# Patient Record
Sex: Female | Born: 1950
Health system: Southern US, Community
[De-identification: ages and names within clinical notes are randomized; demographics above are authoritative.]

## PROBLEM LIST (undated history)

## (undated) DIAGNOSIS — Z974 Presence of external hearing-aid: Secondary | ICD-10-CM

## (undated) DIAGNOSIS — E039 Hypothyroidism, unspecified: Secondary | ICD-10-CM

## (undated) DIAGNOSIS — K219 Gastro-esophageal reflux disease without esophagitis: Secondary | ICD-10-CM

## (undated) DIAGNOSIS — G43909 Migraine, unspecified, not intractable, without status migrainosus: Secondary | ICD-10-CM

## (undated) DIAGNOSIS — E119 Type 2 diabetes mellitus without complications: Secondary | ICD-10-CM

## (undated) DIAGNOSIS — D649 Anemia, unspecified: Secondary | ICD-10-CM

## (undated) DIAGNOSIS — R011 Cardiac murmur, unspecified: Secondary | ICD-10-CM

## (undated) DIAGNOSIS — I639 Cerebral infarction, unspecified: Secondary | ICD-10-CM

## (undated) DIAGNOSIS — M1990 Unspecified osteoarthritis, unspecified site: Secondary | ICD-10-CM

## (undated) DIAGNOSIS — E785 Hyperlipidemia, unspecified: Secondary | ICD-10-CM

## (undated) DIAGNOSIS — M545 Low back pain, unspecified: Secondary | ICD-10-CM

## (undated) DIAGNOSIS — T7840XA Allergy, unspecified, initial encounter: Secondary | ICD-10-CM

## (undated) DIAGNOSIS — F32A Depression, unspecified: Secondary | ICD-10-CM

## (undated) DIAGNOSIS — H209 Unspecified iridocyclitis: Secondary | ICD-10-CM

## (undated) DIAGNOSIS — Z973 Presence of spectacles and contact lenses: Secondary | ICD-10-CM

## (undated) DIAGNOSIS — N390 Urinary tract infection, site not specified: Secondary | ICD-10-CM

## (undated) DIAGNOSIS — J189 Pneumonia, unspecified organism: Secondary | ICD-10-CM

## (undated) DIAGNOSIS — R0683 Snoring: Secondary | ICD-10-CM

## (undated) DIAGNOSIS — G4733 Obstructive sleep apnea (adult) (pediatric): Secondary | ICD-10-CM

## (undated) DIAGNOSIS — I10 Essential (primary) hypertension: Secondary | ICD-10-CM

## (undated) DIAGNOSIS — K760 Fatty (change of) liver, not elsewhere classified: Secondary | ICD-10-CM

## (undated) DIAGNOSIS — G8929 Other chronic pain: Secondary | ICD-10-CM

## (undated) DIAGNOSIS — I5189 Other ill-defined heart diseases: Secondary | ICD-10-CM

## (undated) DIAGNOSIS — F329 Major depressive disorder, single episode, unspecified: Secondary | ICD-10-CM

## (undated) DIAGNOSIS — F419 Anxiety disorder, unspecified: Secondary | ICD-10-CM

## (undated) DIAGNOSIS — K5790 Diverticulosis of intestine, part unspecified, without perforation or abscess without bleeding: Secondary | ICD-10-CM

## (undated) DIAGNOSIS — Z9989 Dependence on other enabling machines and devices: Secondary | ICD-10-CM

## (undated) DIAGNOSIS — Z0000 Encounter for general adult medical examination without abnormal findings: Secondary | ICD-10-CM

## (undated) DIAGNOSIS — K7581 Nonalcoholic steatohepatitis (NASH): Secondary | ICD-10-CM

## (undated) HISTORY — DX: Hyperlipidemia, unspecified: E78.5

## (undated) HISTORY — DX: Unspecified iridocyclitis: H20.9

## (undated) HISTORY — DX: Migraine, unspecified, not intractable, without status migrainosus: G43.909

## (undated) HISTORY — DX: Fatty (change of) liver, not elsewhere classified: K76.0

## (undated) HISTORY — DX: Anemia, unspecified: D64.9

## (undated) HISTORY — DX: Unspecified osteoarthritis, unspecified site: M19.90

## (undated) HISTORY — DX: Major depressive disorder, single episode, unspecified: F32.9

## (undated) HISTORY — DX: Encounter for general adult medical examination without abnormal findings: Z00.00

## (undated) HISTORY — DX: Hypothyroidism, unspecified: E03.9

## (undated) HISTORY — PX: POLYPECTOMY: SHX149

## (undated) HISTORY — DX: Diverticulosis of intestine, part unspecified, without perforation or abscess without bleeding: K57.90

## (undated) HISTORY — DX: Gastro-esophageal reflux disease without esophagitis: K21.9

## (undated) HISTORY — DX: Depression, unspecified: F32.A

## (undated) HISTORY — DX: Essential (primary) hypertension: I10

## (undated) HISTORY — DX: Cardiac murmur, unspecified: R01.1

## (undated) HISTORY — DX: Allergy, unspecified, initial encounter: T78.40XA

## (undated) HISTORY — DX: Cerebral infarction, unspecified: I63.9

## (undated) HISTORY — PX: BLEPHAROPLASTY: SUR158

## (undated) HISTORY — PX: HEMORRHOID BANDING: SHX5850

## (undated) HISTORY — PX: TUBAL LIGATION: SHX77

---
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
## 1990-09-12 HISTORY — PX: VAGINAL HYSTERECTOMY: SUR661

## 1999-06-28 ENCOUNTER — Ambulatory Visit (HOSPITAL_COMMUNITY): Admission: RE | Admit: 1999-06-28 | Discharge: 1999-06-28 | Payer: Self-pay | Admitting: Gastroenterology

## 1999-09-13 HISTORY — PX: PELVIC FLOOR REPAIR: SHX2192

## 2007-08-13 LAB — HM COLONOSCOPY

## 2010-09-12 LAB — HM PAP SMEAR

## 2010-09-12 LAB — HM MAMMOGRAPHY

## 2011-09-13 HISTORY — PX: SHOULDER ARTHROSCOPY W/ ROTATOR CUFF REPAIR: SHX2400

## 2011-12-16 ENCOUNTER — Ambulatory Visit (INDEPENDENT_AMBULATORY_CARE_PROVIDER_SITE_OTHER): Payer: BC Managed Care – PPO | Admitting: Family Medicine

## 2011-12-16 ENCOUNTER — Encounter: Payer: Self-pay | Admitting: Family Medicine

## 2011-12-16 ENCOUNTER — Ambulatory Visit: Payer: Self-pay | Admitting: Family Medicine

## 2011-12-16 ENCOUNTER — Ambulatory Visit: Payer: Self-pay | Admitting: Internal Medicine

## 2011-12-16 VITALS — BP 106/58 | HR 90 | Temp 98.5°F | Ht 69.0 in | Wt 175.8 lb

## 2011-12-16 DIAGNOSIS — H209 Unspecified iridocyclitis: Secondary | ICD-10-CM

## 2011-12-16 DIAGNOSIS — M1990 Unspecified osteoarthritis, unspecified site: Secondary | ICD-10-CM

## 2011-12-16 DIAGNOSIS — F329 Major depressive disorder, single episode, unspecified: Secondary | ICD-10-CM

## 2011-12-16 DIAGNOSIS — M25519 Pain in unspecified shoulder: Secondary | ICD-10-CM

## 2011-12-16 DIAGNOSIS — G43909 Migraine, unspecified, not intractable, without status migrainosus: Secondary | ICD-10-CM

## 2011-12-16 DIAGNOSIS — E785 Hyperlipidemia, unspecified: Secondary | ICD-10-CM

## 2011-12-16 DIAGNOSIS — E039 Hypothyroidism, unspecified: Secondary | ICD-10-CM

## 2011-12-16 DIAGNOSIS — K5732 Diverticulitis of large intestine without perforation or abscess without bleeding: Secondary | ICD-10-CM

## 2011-12-16 DIAGNOSIS — K5792 Diverticulitis of intestine, part unspecified, without perforation or abscess without bleeding: Secondary | ICD-10-CM

## 2011-12-16 DIAGNOSIS — E119 Type 2 diabetes mellitus without complications: Secondary | ICD-10-CM

## 2011-12-16 MED ORDER — CYCLOBENZAPRINE HCL 10 MG PO TABS: 10.0000 mg | ORAL_TABLET | Freq: Every evening | ORAL | Status: AC | PRN

## 2011-12-16 NOTE — Patient Instructions (Addendum)
Check the american diabetes association website.   Exercise more.  Walking would be good.   Return for fasting labs.  We'll contact you with your lab report. Check on your insurance for the diabetes education class coverage.   Recheck in 6 months with labs ahead of time.

## 2011-12-19 ENCOUNTER — Encounter: Payer: Self-pay | Admitting: Family Medicine

## 2011-12-19 DIAGNOSIS — G43909 Migraine, unspecified, not intractable, without status migrainosus: Secondary | ICD-10-CM | POA: Insufficient documentation

## 2011-12-19 DIAGNOSIS — E785 Hyperlipidemia, unspecified: Secondary | ICD-10-CM | POA: Insufficient documentation

## 2011-12-19 DIAGNOSIS — M1990 Unspecified osteoarthritis, unspecified site: Secondary | ICD-10-CM | POA: Insufficient documentation

## 2011-12-19 DIAGNOSIS — K5792 Diverticulitis of intestine, part unspecified, without perforation or abscess without bleeding: Secondary | ICD-10-CM | POA: Insufficient documentation

## 2011-12-19 DIAGNOSIS — F329 Major depressive disorder, single episode, unspecified: Secondary | ICD-10-CM | POA: Insufficient documentation

## 2011-12-19 DIAGNOSIS — E1169 Type 2 diabetes mellitus with other specified complication: Secondary | ICD-10-CM | POA: Insufficient documentation

## 2011-12-19 DIAGNOSIS — E039 Hypothyroidism, unspecified: Secondary | ICD-10-CM | POA: Insufficient documentation

## 2011-12-19 DIAGNOSIS — H209 Unspecified iridocyclitis: Secondary | ICD-10-CM | POA: Insufficient documentation

## 2011-12-19 DIAGNOSIS — M25519 Pain in unspecified shoulder: Secondary | ICD-10-CM | POA: Insufficient documentation

## 2011-12-19 DIAGNOSIS — R739 Hyperglycemia, unspecified: Secondary | ICD-10-CM | POA: Insufficient documentation

## 2011-12-19 NOTE — Assessment & Plan Note (Signed)
Work on diet, weight, return for fasting labs.  Will review old records.

## 2011-12-19 NOTE — Progress Notes (Signed)
New patient.  Diabetes:  Using medications without difficulties:yes Hypoglycemic episodes:no Hyperglycemic episodes:no Feet problems:no Blood Sugars averaging: ~100 eye exam within last year: yes  L shoulder pain with dec in ROM.  Ongoing, no acute injury.  No R shoulder pain.  Pain reaching overhead with L arm.   Elevated Cholesterol: Using medications without problems:yes Muscle aches: except as above Diet compliance:yes Exercise:yes Other complaints: started on vytorin and due for repeat labs   PMH and SH reviewed  Meds, vitals, and allergies reviewed.   ROS: See HPI.  Otherwise negative.    GEN: nad, alert and oriented HEENT: mucous membranes moist NECK: supple w/o LA CV: rrr. Soft (1/6 SEM) murmur noted PULM: ctab, no inc wob ABD: soft, +bs EXT: no edema SKIN: no acute rash L shoulder with pain on cuff testing, ext rotation.  Dec in abduction, pain with supraspinatus testing. No pain on AC joint testing.

## 2011-12-19 NOTE — Assessment & Plan Note (Signed)
Work on diet, weight, return for fasting labs.  Will review old records.   

## 2011-12-19 NOTE — Assessment & Plan Note (Addendum)
Use flexeril for pain and refer to PT.  She declined ortho referral today.  Likely cuff pathology, okay for conservative tx initially.  >30 min spent with face to face with patient, >50% counseling and/or coordinating care

## 2011-12-22 ENCOUNTER — Telehealth: Payer: Self-pay | Admitting: Family Medicine

## 2011-12-22 NOTE — Telephone Encounter (Signed)
Done

## 2011-12-22 NOTE — Telephone Encounter (Signed)
Please scan selected items and then send medical records back to patient.  I didn't see specific date of td/pna vaccine in records.  These can be done later.  Thanks.

## 2011-12-23 ENCOUNTER — Telehealth: Payer: Self-pay | Admitting: *Deleted

## 2011-12-23 NOTE — Telephone Encounter (Signed)
I phoned the patient to let her know that her records have been reviewed, scanned and are ready for her to pick up.  She asked if she should have any fasting lab work right away or wait until her appt in October?  Please advise.

## 2011-12-25 NOTE — Telephone Encounter (Signed)
Discussed at last OV.  She was to come back for fasting labs after that OV and then get labs again before OV in the fall.  Future orders were put in the day of the OV.

## 2011-12-26 ENCOUNTER — Other Ambulatory Visit: Payer: Self-pay | Admitting: Family Medicine

## 2011-12-26 NOTE — Telephone Encounter (Signed)
Patient advised, appt scheduled.  

## 2011-12-28 ENCOUNTER — Other Ambulatory Visit: Payer: BC Managed Care – PPO

## 2011-12-29 ENCOUNTER — Other Ambulatory Visit (INDEPENDENT_AMBULATORY_CARE_PROVIDER_SITE_OTHER): Payer: BC Managed Care – PPO

## 2011-12-29 DIAGNOSIS — E119 Type 2 diabetes mellitus without complications: Secondary | ICD-10-CM

## 2011-12-29 LAB — LIPID PANEL
Cholesterol: 128 mg/dL (ref 0–200)
HDL: 39 mg/dL — ABNORMAL LOW (ref >39.00)
LDL Cholesterol: 74 mg/dL (ref 0–99)
Total CHOL/HDL Ratio: 3
Triglycerides: 74 mg/dL (ref 0.0–149.0)
VLDL: 14.8 mg/dL (ref 0.0–40.0)

## 2011-12-29 LAB — HEMOGLOBIN A1C: Hgb A1c MFr Bld: 6.4 % (ref 4.6–6.5)

## 2011-12-30 ENCOUNTER — Encounter: Payer: Self-pay | Admitting: *Deleted

## 2011-12-30 ENCOUNTER — Other Ambulatory Visit: Payer: Self-pay

## 2011-12-30 MED ORDER — EZETIMIBE-SIMVASTATIN 10-40 MG PO TABS: 1.0000 | ORAL_TABLET | Freq: Every day | ORAL | Status: DC

## 2011-12-30 NOTE — Telephone Encounter (Signed)
Pt request new rx Vytorin 10-40 mg #30 x 11 to [facility].pt notified done while on phone.

## 2012-01-03 ENCOUNTER — Telehealth: Payer: Self-pay | Admitting: *Deleted

## 2012-01-03 NOTE — Telephone Encounter (Signed)
Received faxed request from pharmacy requesting prior authorization on Vytorin.  Was informed that Allison Yoder gave patient samples because she was unable to tolerate others. Form from [employer] is in your in box to be completed and faxed back.

## 2012-01-03 NOTE — Telephone Encounter (Signed)
I didn't see intolerances listed prev in the old records.  See what she was unable to tolerate and the estimated dates of treatment.  Thanks.

## 2012-01-03 NOTE — Telephone Encounter (Signed)
Patient says she was given samples to try for 2 week intervals and she doesn't remember any estimated dates of treatment.  She sort of went from one to the other over a 6 month period of time.  Lipitor, Simvastatin and Pravastatin all caused muscle aches and pains.  Niacin gave her severe hot flashes and flushing.

## 2012-01-05 ENCOUNTER — Telehealth: Payer: Self-pay

## 2012-01-05 NOTE — Telephone Encounter (Signed)
Pt called to let Lugene know that pt received lab results and pt will be contacting insurance co. Pt left callback # if needed [phone].

## 2012-01-06 ENCOUNTER — Telehealth: Payer: Self-pay

## 2012-01-06 DIAGNOSIS — M25519 Pain in unspecified shoulder: Secondary | ICD-10-CM

## 2012-01-06 NOTE — Telephone Encounter (Signed)
Faxed and given to Rena to hold for response. 

## 2012-01-06 NOTE — Telephone Encounter (Signed)
Patient says she doesn't understand it but the Vytorin does not bother her and the Simvastatin did.  She would like to proceed with the PA.  She says she is going to call her insurance company also.  I told her that the fact that the Vytorin had Simvastatin in it may be their argument.

## 2012-01-06 NOTE — Telephone Encounter (Signed)
Filled out, please scan and send.  Thanks.

## 2012-01-06 NOTE — Telephone Encounter (Signed)
Pt seen in PT today and therapist suggested orthopedic referral for evaluation of neck and shoulder pain. Pt can be reached at [phone],

## 2012-01-06 NOTE — Telephone Encounter (Signed)
Clarify with patient, because if she is taking vytorin, then she's already taking simvastatin.  And if that's the case, then it would make sense to switch her to plain simvastatin.

## 2012-01-08 NOTE — Telephone Encounter (Signed)
Referral ordered

## 2012-01-09 NOTE — Telephone Encounter (Signed)
Patient advised.

## 2012-01-11 NOTE — Telephone Encounter (Signed)
Approval letter faxed to [facility]. Placed on shelf for Dr Royden Purl signature and then PA form and letter should be sent for scanning.

## 2012-01-20 ENCOUNTER — Encounter: Payer: Self-pay | Admitting: *Deleted

## 2012-02-03 ENCOUNTER — Encounter: Payer: Self-pay | Admitting: Internal Medicine

## 2012-02-03 ENCOUNTER — Ambulatory Visit (INDEPENDENT_AMBULATORY_CARE_PROVIDER_SITE_OTHER): Payer: BC Managed Care – PPO | Admitting: Internal Medicine

## 2012-02-03 VITALS — BP 100/60 | HR 79 | Ht 67.0 in | Wt 174.5 lb

## 2012-02-03 DIAGNOSIS — R1013 Epigastric pain: Secondary | ICD-10-CM

## 2012-02-03 DIAGNOSIS — K5732 Diverticulitis of large intestine without perforation or abscess without bleeding: Secondary | ICD-10-CM

## 2012-02-03 DIAGNOSIS — K3189 Other diseases of stomach and duodenum: Secondary | ICD-10-CM

## 2012-02-03 MED ORDER — FLUCONAZOLE 100 MG PO TABS: 100.0000 mg | ORAL_TABLET | Freq: Every day | ORAL | Status: AC

## 2012-02-03 MED ORDER — DICYCLOMINE HCL 10 MG PO CAPS: 10.0000 mg | ORAL_CAPSULE | Freq: Three times a day (TID) | ORAL | Status: DC | PRN

## 2012-02-03 NOTE — Patient Instructions (Signed)
You have been scheduled for an abdominal ultrasound at [facility] (1st floor of hospital) on Friday, 02/10/12 at 2:30 pm. Please arrive 15 minutes prior to your appointment for registration. Make certain not to have anything to eat or drink 8 hours prior to your appointment. Should you need to reschedule your appointment, please contact radiology at [phone]. We have sent the following medications to your pharmacy for you to pick up at your convenience: Diflucan Bentyl Prilosec CC: Dr Crawford Givens

## 2012-02-03 NOTE — Progress Notes (Signed)
[employer] Ouk 02-Dec-1950 MRN [id number]    History of Present Illness:  This is a 61 year old white female with chronic dyspepsia which was evaluated several years ago in [place], [place] with an upper endoscopy. She also had a colonoscopy  with unknown results she was told that the exam was normal.. She also has symptomatic diverticulosis with several discrete episodes of diverticulitis; the last episode several months ago responded to Flagyl and Cipro. A CT scan of the abdomen and pelvis in 2009 showed inflammatory changes at the junction of the sigmoid and descending colon consistent with diverticulitis. Her diverticulitis at this time has resolved, it was active 2 months ago when she made her appointment with [place]. She is back on a high-fiber diet. Another complaint has been odynophagia and dysphagia .   She is a diabetic on metformin. She denies hx of having Candida esophagitis in the past.   Past Medical History  Diagnosis Date  . Anxiety and depression   . Diabetes mellitus   . GERD (gastroesophageal reflux disease)   . Allergy   . Heart murmur   . Hypertension   . Hyperlipidemia   . Hypothyroidism   . History of chicken pox   . Migraines   . Urinary incontinence   . UTI (lower urinary tract infection)     recurrent  . Arthritis     L shoulder  . Iritis     per [facility]  . Diverticulosis   . Fatty liver   . Anemia   . Fibromyalgia    Past Surgical History  Procedure Date  . Breast surgery 1964    Biopsy, benign tumor, right  . Abdominal hysterectomy 1992    Partial  . Bladder/pelvic tack 2001    reports that she quit smoking about 26 years ago. Her smoking use included Cigarettes. She has a 10 pack-year smoking history. She has never used smokeless tobacco. She reports that she drinks alcohol. She reports that she does not use illicit drugs. family history includes Alcohol abuse in her father; Arthritis in her maternal grandmother, mother, and sister;  Breast cancer in her cousin; Cancer in her father; Depression in her mothers; Diabetes in her maternal grandmother, mothers, paternal uncle, and sister; Heart disease in her mother, paternal aunt, and paternal uncle; Hyperlipidemia in her mother; Hypertension in her mother; and Stroke in her maternal grandmother. Allergies  Allergen Reactions  . Cephalexin Itching  . Codeine Itching  . Lipitor (Atorvastatin Calcium)     Myalgias   . Niacin And Related     Flushing  . Pravastatin     Myalgias        Review of Systems: Positive for dysphagia, odynophagia dyspepsia  The remainder of the 10 point ROS is negative except as outlined in H&P   Physical Exam: General appearance  Well developed, in no distress. Eyes- non icteric. HEENT nontraumatic, normocephalic. Mouth no lesions, tongue papillated, no cheilosis, no thrush. Neck supple without adenopathy, thyroid not enlarged, no carotid bruits, no JVD. Lungs Clear to auscultation bilaterally. Cor normal S1, normal S2, regular rhythm, no murmur,  quiet precordium. Abdomen: Soft abdomen with normoactive bowel sounds. Very tender left lower and left middle quadrants with normoactive bowel sounds and no palpable mass or rebound. Liver edge at costal margin. Rectal: Soft Hemoccult negative stool. Extremities no pedal edema. Skin no lesions. Neurological alert and oriented x 3. Psychological normal mood and affect.  Assessment and Plan:  Problem #1 status post diverticulitis. Patient has  had at least 3 or 4 discreet attacks of diverticulitis in the last 4 years. She had a colonoscopy in December 2008 which was apparently  normal. There is no occult bleeding and she denies any current lower GI symptoms. She will stay on a high-fiber diet and will start dicyclomine 10 mg 3 times a day when necessary. I have discussed the possibility of sigmoid resection with repeated attacks.She would need to have a colonoscopy before considering  surgery.  Problem #2 dyspepsia. She has a family history of gallbladder disease in her brother who is our patient. We will proceed with an upper abdominal ultrasound. We will also treat her for Candida esophagitis with Diflucan 100 mg daily x 3 and I have also given her samples of Prilosec. She would like to be treated empirically before we proceed with an upper endoscopy. The upper endoscopy would be helpful in ruling out H. pylori gastritis.Start prilosec 20 mg po qd     02/03/2012 Lina Sar

## 2012-02-10 ENCOUNTER — Ambulatory Visit (HOSPITAL_COMMUNITY)
Admission: RE | Admit: 2012-02-10 | Discharge: 2012-02-10 | Disposition: A | Discharge status: Home / Self Care | Payer: BC Managed Care – PPO | Source: Ambulatory Visit | Attending: Internal Medicine | Admitting: Internal Medicine

## 2012-02-10 DIAGNOSIS — K3189 Other diseases of stomach and duodenum: Secondary | ICD-10-CM | POA: Insufficient documentation

## 2012-02-10 DIAGNOSIS — R1013 Epigastric pain: Secondary | ICD-10-CM

## 2012-02-10 DIAGNOSIS — R110 Nausea: Secondary | ICD-10-CM | POA: Insufficient documentation

## 2012-02-13 ENCOUNTER — Telehealth: Payer: Self-pay | Admitting: *Deleted

## 2012-02-13 NOTE — Telephone Encounter (Signed)
Left message on patient's voicemail to call back

## 2012-02-13 NOTE — Telephone Encounter (Signed)
Patient called back. She states that she would like Dr Juanda Chance to know that she has had "some trouble with diverticulitis" over the last couple of days. However, she has put herself on a liquid diet and feels somewhat better today. She complains of some left sided abdominal pain. She will also call back in 1-2 days with an update. Dr Juanda Chance, any further recommendations at this time?

## 2012-02-13 NOTE — Telephone Encounter (Signed)
I agree with soft diet and Continue Dicyclomine 10 mg po tid. Is Sx's continue, suggest colonoscopy.

## 2012-02-13 NOTE — Telephone Encounter (Signed)
I have actually gone ahead and left voicemail for patient that her ultrasound was normal as we have a note in the system stating we may leave a detailed message for her. I have asked that she call back should her symptoms change or fail to improve.

## 2012-02-13 NOTE — Telephone Encounter (Signed)
Message copied by Richardson Chiquito on Mon Feb 13, 2012 12:42 PM ------      Message from: Hart Carwin      Created: Fri Feb 10, 2012 11:57 PM       Please call pt with normal gall bladder and liver ultrasound

## 2012-02-13 NOTE — Telephone Encounter (Signed)
Patient advised of Dr Brodie's recommendations. She verbalizes understanding. 

## 2012-03-02 ENCOUNTER — Other Ambulatory Visit: Payer: Self-pay | Admitting: Orthopedic Surgery

## 2012-03-09 ENCOUNTER — Encounter (HOSPITAL_BASED_OUTPATIENT_CLINIC_OR_DEPARTMENT_OTHER)
Admission: RE | Admit: 2012-03-09 | Discharge: 2012-03-09 | Disposition: A | Discharge status: Home / Self Care | Payer: BC Managed Care – PPO | Source: Ambulatory Visit | Attending: Orthopedic Surgery | Admitting: Orthopedic Surgery

## 2012-03-09 ENCOUNTER — Encounter (HOSPITAL_BASED_OUTPATIENT_CLINIC_OR_DEPARTMENT_OTHER): Payer: Self-pay | Admitting: *Deleted

## 2012-03-09 ENCOUNTER — Other Ambulatory Visit: Payer: Self-pay

## 2012-03-09 LAB — BASIC METABOLIC PANEL
BUN: 18 mg/dL (ref 6–23)
CO2: 25 mEq/L (ref 19–32)
Calcium: 9.3 mg/dL (ref 8.4–10.5)
Chloride: 106 mEq/L (ref 96–112)
Creatinine, Ser: 0.8 mg/dL (ref 0.50–1.10)
GFR calc Af Amer: >90 mL/min (ref >90)
GFR calc non Af Amer: 78 mL/min — ABNORMAL LOW (ref >90)
Glucose, Bld: 116 mg/dL — ABNORMAL HIGH (ref 70–99)
Potassium: 3.9 mEq/L (ref 3.5–5.1)
Sodium: 142 mEq/L (ref 135–145)

## 2012-03-09 NOTE — Progress Notes (Signed)
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

## 2012-03-13 ENCOUNTER — Encounter (HOSPITAL_BASED_OUTPATIENT_CLINIC_OR_DEPARTMENT_OTHER)
Admission: RE | Disposition: A | Discharge status: Home / Self Care | Payer: Self-pay | Source: Ambulatory Visit | Attending: Orthopedic Surgery

## 2012-03-13 ENCOUNTER — Encounter (HOSPITAL_BASED_OUTPATIENT_CLINIC_OR_DEPARTMENT_OTHER): Payer: Self-pay | Admitting: Anesthesiology

## 2012-03-13 ENCOUNTER — Encounter (HOSPITAL_BASED_OUTPATIENT_CLINIC_OR_DEPARTMENT_OTHER): Payer: Self-pay | Admitting: *Deleted

## 2012-03-13 ENCOUNTER — Ambulatory Visit (HOSPITAL_BASED_OUTPATIENT_CLINIC_OR_DEPARTMENT_OTHER): Payer: BC Managed Care – PPO | Admitting: Anesthesiology

## 2012-03-13 ENCOUNTER — Ambulatory Visit (HOSPITAL_BASED_OUTPATIENT_CLINIC_OR_DEPARTMENT_OTHER)
Admission: RE | Admit: 2012-03-13 | Discharge: 2012-03-13 | Disposition: A | Discharge status: Home / Self Care | Payer: BC Managed Care – PPO | Source: Ambulatory Visit | Attending: Orthopedic Surgery | Admitting: Orthopedic Surgery

## 2012-03-13 DIAGNOSIS — E119 Type 2 diabetes mellitus without complications: Secondary | ICD-10-CM | POA: Insufficient documentation

## 2012-03-13 DIAGNOSIS — K219 Gastro-esophageal reflux disease without esophagitis: Secondary | ICD-10-CM | POA: Insufficient documentation

## 2012-03-13 DIAGNOSIS — Z01812 Encounter for preprocedural laboratory examination: Secondary | ICD-10-CM | POA: Insufficient documentation

## 2012-03-13 DIAGNOSIS — F3289 Other specified depressive episodes: Secondary | ICD-10-CM | POA: Insufficient documentation

## 2012-03-13 DIAGNOSIS — R51 Headache: Secondary | ICD-10-CM | POA: Insufficient documentation

## 2012-03-13 DIAGNOSIS — E039 Hypothyroidism, unspecified: Secondary | ICD-10-CM | POA: Insufficient documentation

## 2012-03-13 DIAGNOSIS — M25519 Pain in unspecified shoulder: Secondary | ICD-10-CM

## 2012-03-13 DIAGNOSIS — M67919 Unspecified disorder of synovium and tendon, unspecified shoulder: Secondary | ICD-10-CM | POA: Insufficient documentation

## 2012-03-13 DIAGNOSIS — M719 Bursopathy, unspecified: Secondary | ICD-10-CM | POA: Insufficient documentation

## 2012-03-13 DIAGNOSIS — IMO0001 Reserved for inherently not codable concepts without codable children: Secondary | ICD-10-CM | POA: Insufficient documentation

## 2012-03-13 DIAGNOSIS — F329 Major depressive disorder, single episode, unspecified: Secondary | ICD-10-CM | POA: Insufficient documentation

## 2012-03-13 DIAGNOSIS — I10 Essential (primary) hypertension: Secondary | ICD-10-CM | POA: Insufficient documentation

## 2012-03-13 HISTORY — DX: Snoring: R06.83

## 2012-03-13 HISTORY — DX: Presence of spectacles and contact lenses: Z97.3

## 2012-03-13 LAB — POCT HEMOGLOBIN-HEMACUE: Hemoglobin: 14.5 g/dL (ref 12.0–15.0)

## 2012-03-13 LAB — GLUCOSE, CAPILLARY: Glucose-Capillary: 104 mg/dL — ABNORMAL HIGH (ref 70–99)

## 2012-03-13 SURGERY — ARTHROSCOPY, SHOULDER, WITH ROTATOR CUFF REPAIR
Anesthesia: General | Site: Shoulder | Laterality: Left | Wound class: Clean

## 2012-03-13 MED ORDER — SUCCINYLCHOLINE CHLORIDE 20 MG/ML IJ SOLN
INTRAMUSCULAR | Status: DC | PRN
  Administered 2012-03-13: 100 mg via INTRAVENOUS

## 2012-03-13 MED ORDER — ROPIVACAINE HCL 5 MG/ML IJ SOLN
INTRAMUSCULAR | Status: DC | PRN
  Administered 2012-03-13: 15 mL via EPIDURAL

## 2012-03-13 MED ORDER — POVIDONE-IODINE 7.5 % EX SOLN: Freq: Once | CUTANEOUS | Status: DC

## 2012-03-13 MED ORDER — METOCLOPRAMIDE HCL 5 MG/ML IJ SOLN
INTRAMUSCULAR | Status: DC | PRN
  Administered 2012-03-13: 10 mg via INTRAVENOUS

## 2012-03-13 MED ORDER — LIDOCAINE HCL (CARDIAC) 10 MG/ML IV SOLN
INTRAVENOUS | Status: DC | PRN
  Administered 2012-03-13: 40 mg via INTRAVENOUS

## 2012-03-13 MED ORDER — PROPOFOL 10 MG/ML IV EMUL
INTRAVENOUS | Status: DC | PRN
  Administered 2012-03-13: 30 mg via INTRAVENOUS
  Administered 2012-03-13: 200 mg via INTRAVENOUS

## 2012-03-13 MED ORDER — ONDANSETRON HCL 4 MG/2ML IJ SOLN
INTRAMUSCULAR | Status: DC | PRN
  Administered 2012-03-13: 4 mg via INTRAVENOUS

## 2012-03-13 MED ORDER — METOCLOPRAMIDE HCL 5 MG/ML IJ SOLN: 10.0000 mg | Freq: Once | INTRAMUSCULAR | Status: DC | PRN

## 2012-03-13 MED ORDER — OXYCODONE-ACETAMINOPHEN 5-325 MG PO TABS: 1.0000 | ORAL_TABLET | ORAL | Status: AC | PRN

## 2012-03-13 MED ORDER — HYDROMORPHONE HCL PF 1 MG/ML IJ SOLN: 0.2500 mg | INTRAMUSCULAR | Status: DC | PRN

## 2012-03-13 MED ORDER — FENTANYL CITRATE 0.05 MG/ML IJ SOLN
INTRAMUSCULAR | Status: DC | PRN
  Administered 2012-03-13 (×2): 25 ug via INTRAVENOUS

## 2012-03-13 MED ORDER — FENTANYL CITRATE 0.05 MG/ML IJ SOLN
50.0000 ug | INTRAMUSCULAR | Status: DC | PRN
  Administered 2012-03-13: 100 ug via INTRAVENOUS

## 2012-03-13 MED ORDER — DEXAMETHASONE SODIUM PHOSPHATE 4 MG/ML IJ SOLN
INTRAMUSCULAR | Status: DC | PRN
  Administered 2012-03-13: 10 mg via INTRAVENOUS

## 2012-03-13 MED ORDER — OXYCODONE HCL 5 MG PO TABS: 5.0000 mg | ORAL_TABLET | Freq: Once | ORAL | Status: DC | PRN

## 2012-03-13 MED ORDER — MIDAZOLAM HCL 2 MG/2ML IJ SOLN
0.5000 mg | INTRAMUSCULAR | Status: DC | PRN
  Administered 2012-03-13: 2 mg via INTRAVENOUS

## 2012-03-13 MED ORDER — CLINDAMYCIN PHOSPHATE 600 MG/50ML IV SOLN
600.0000 mg | INTRAVENOUS | Status: AC
  Administered 2012-03-13: 600 mg via INTRAVENOUS

## 2012-03-13 MED ORDER — LIDOCAINE HCL 1 % IJ SOLN
INTRAMUSCULAR | Status: DC | PRN
  Administered 2012-03-13: 2 mL via INTRADERMAL

## 2012-03-13 MED ORDER — SODIUM CHLORIDE 0.9 % IR SOLN
Status: DC | PRN
  Administered 2012-03-13: 9000 mL

## 2012-03-13 MED ORDER — LACTATED RINGERS IV SOLN
INTRAVENOUS | Status: DC
  Administered 2012-03-13 (×3): via INTRAVENOUS

## 2012-03-13 MED ORDER — EPHEDRINE SULFATE 50 MG/ML IJ SOLN
INTRAMUSCULAR | Status: DC | PRN
  Administered 2012-03-13: 10 mg via INTRAVENOUS

## 2012-03-13 SURGICAL SUPPLY — 93 items
ADH SKN CLS APL DERMABOND .7 (GAUZE/BANDAGES/DRESSINGS)
ANCH SUT 2 FT CRKSW 14.7X5.5 (Anchor) ×1 IMPLANT
ANCHOR CORKSCREW FIBER 5.5X15 (Anchor) ×1 IMPLANT
APL SKNCLS STERI-STRIP NONHPOA (GAUZE/BANDAGES/DRESSINGS)
BENZOIN TINCTURE PRP APPL 2/3 (GAUZE/BANDAGES/DRESSINGS) IMPLANT
BLADE 4.2CUDA (BLADE) IMPLANT
BLADE CUDA 5.5 (BLADE) IMPLANT
BLADE CUTTER GATOR 3.5 (BLADE) IMPLANT
BLADE GREAT WHITE 4.2 (BLADE) IMPLANT
BLADE SURG 15 STRL LF DISP TIS (BLADE) IMPLANT
BLADE SURG 15 STRL SS (BLADE)
BLADE SURG ROTATE 9660 (MISCELLANEOUS) IMPLANT
BUR 3.5 LG SPHERICAL (BURR) IMPLANT
BUR OVAL 4.0 (BURR) ×2 IMPLANT
BUR OVAL 6.0 (BURR) IMPLANT
BURR 3.5 LG SPHERICAL (BURR)
CANISTER OMNI JUG 16 LITER (MISCELLANEOUS) ×2 IMPLANT
CANISTER SUCTION 2500CC (MISCELLANEOUS) IMPLANT
CANNULA 5.75X71 LONG (CANNULA) ×2 IMPLANT
CANNULA TWIST IN 8.25X7CM (CANNULA) ×1 IMPLANT
CHLORAPREP W/TINT 26ML (MISCELLANEOUS) ×2 IMPLANT
CLOTH BEACON ORANGE TIMEOUT ST (SAFETY) ×2 IMPLANT
DECANTER SPIKE VIAL GLASS SM (MISCELLANEOUS) IMPLANT
DERMABOND ADVANCED (GAUZE/BANDAGES/DRESSINGS)
DERMABOND ADVANCED .7 DNX12 (GAUZE/BANDAGES/DRESSINGS) IMPLANT
DRAPE INCISE IOBAN 66X45 STRL (DRAPES) ×2 IMPLANT
DRAPE STERI 35X30 U-POUCH (DRAPES) ×2 IMPLANT
DRAPE SURG 17X23 STRL (DRAPES) ×2 IMPLANT
DRAPE U 20/CS (DRAPES) ×2 IMPLANT
DRAPE U-SHAPE 47X51 STRL (DRAPES) ×2 IMPLANT
DRAPE U-SHAPE 76X120 STRL (DRAPES) ×4 IMPLANT
DRSG PAD ABDOMINAL 8X10 ST (GAUZE/BANDAGES/DRESSINGS) ×2 IMPLANT
ELECT REM PT RETURN 9FT ADLT (ELECTROSURGICAL) ×2
ELECTRODE REM PT RTRN 9FT ADLT (ELECTROSURGICAL) ×1 IMPLANT
GAUZE SPONGE 4X4 16PLY XRAY LF (GAUZE/BANDAGES/DRESSINGS) IMPLANT
GAUZE XEROFORM 1X8 LF (GAUZE/BANDAGES/DRESSINGS) ×2 IMPLANT
GLOVE BIO SURGEON STRL SZ 6.5 (GLOVE) ×1 IMPLANT
GLOVE BIO SURGEON STRL SZ7.5 (GLOVE) ×4 IMPLANT
GLOVE BIOGEL PI IND STRL 7.0 (GLOVE) IMPLANT
GLOVE BIOGEL PI IND STRL 8 (GLOVE) ×3 IMPLANT
GLOVE BIOGEL PI INDICATOR 7.0 (GLOVE) ×2
GLOVE BIOGEL PI INDICATOR 8 (GLOVE) ×1
GLOVE SKINSENSE NS SZ7.0 (GLOVE) ×1
GLOVE SKINSENSE STRL SZ7.0 (GLOVE) IMPLANT
GOWN PREVENTION PLUS XLARGE (GOWN DISPOSABLE) ×6 IMPLANT
NDL 1/2 CIR CATGUT .05X1.09 (NEEDLE) IMPLANT
NDL SCORPION MULTI FIRE (NEEDLE) IMPLANT
NDL SUT 6 .5 CRC .975X.05 MAYO (NEEDLE) IMPLANT
NEEDLE 1/2 CIR CATGUT .05X1.09 (NEEDLE) IMPLANT
NEEDLE MAYO TAPER (NEEDLE)
NEEDLE SCORPION MULTI FIRE (NEEDLE) ×2 IMPLANT
NS IRRIG 1000ML POUR BTL (IV SOLUTION) IMPLANT
PACK ARTHROSCOPY DSU (CUSTOM PROCEDURE TRAY) ×2 IMPLANT
PACK BASIN DAY SURGERY FS (CUSTOM PROCEDURE TRAY) ×2 IMPLANT
PENCIL BUTTON HOLSTER BLD 10FT (ELECTRODE) IMPLANT
RESECTOR FULL RADIUS 4.2MM (BLADE) ×2 IMPLANT
RESECTOR FULL RADIUS 4.8MM (BLADE) IMPLANT
SHEET MEDIUM DRAPE 40X70 STRL (DRAPES) ×2 IMPLANT
SLEEVE SCD COMPRESS KNEE MED (MISCELLANEOUS) ×2 IMPLANT
SLING ARM FOAM STRAP LRG (SOFTGOODS) ×1 IMPLANT
SLING ARM FOAM STRAP MED (SOFTGOODS) IMPLANT
SLING ARM FOAM STRAP XLG (SOFTGOODS) IMPLANT
SLING ARM IMMOBILIZER MED (SOFTGOODS) IMPLANT
SPONGE GAUZE 4X4 12PLY (GAUZE/BANDAGES/DRESSINGS) ×2 IMPLANT
SPONGE LAP 4X18 X RAY DECT (DISPOSABLE) IMPLANT
STRIP CLOSURE SKIN 1/2X4 (GAUZE/BANDAGES/DRESSINGS) IMPLANT
SUCTION FRAZIER TIP 10 FR DISP (SUCTIONS) IMPLANT
SUPPORT WRAP ARM LG (MISCELLANEOUS) ×1 IMPLANT
SUT 2 FIBERLOOP 20 STRT BLUE (SUTURE)
SUT BONE WAX W31G (SUTURE) IMPLANT
SUT ETHIBOND 2 OS 4 DA (SUTURE) IMPLANT
SUT ETHILON 3 0 PS 1 (SUTURE) ×2 IMPLANT
SUT ETHILON 4 0 PS 2 18 (SUTURE) ×1 IMPLANT
SUT FIBERWIRE #2 38 T-5 BLUE (SUTURE)
SUT FIBERWIRE 2-0 18 17.9 3/8 (SUTURE)
SUT MNCRL AB 3-0 PS2 18 (SUTURE) IMPLANT
SUT MNCRL AB 4-0 PS2 18 (SUTURE) IMPLANT
SUT PDS AB 0 CT 36 (SUTURE) ×1 IMPLANT
SUT PROLENE 3 0 PS 2 (SUTURE) ×1 IMPLANT
SUT VIC AB 0 CT1 18XCR BRD 8 (SUTURE) IMPLANT
SUT VIC AB 0 CT1 8-18 (SUTURE)
SUT VIC AB 2-0 SH 18 (SUTURE) IMPLANT
SUTURE 2 FIBERLOOP 20 STRT BLU (SUTURE) IMPLANT
SUTURE FIBERWR #2 38 T-5 BLUE (SUTURE) IMPLANT
SUTURE FIBERWR 2-0 18 17.9 3/8 (SUTURE) IMPLANT
SYR BULB 3OZ (MISCELLANEOUS) IMPLANT
TOWEL OR 17X24 6PK STRL BLUE (TOWEL DISPOSABLE) ×2 IMPLANT
TOWEL OR NON WOVEN STRL DISP B (DISPOSABLE) ×2 IMPLANT
TUBE CONNECTING 20X1/4 (TUBING) ×2 IMPLANT
TUBING ARTHROSCOPY IRRIG 16FT (MISCELLANEOUS) ×2 IMPLANT
WAND STAR VAC 90 (SURGICAL WAND) ×2 IMPLANT
WATER STERILE IRR 1000ML POUR (IV SOLUTION) ×1 IMPLANT
YANKAUER SUCT BULB TIP NO VENT (SUCTIONS) IMPLANT

## 2012-03-13 NOTE — Anesthesia Postprocedure Evaluation (Signed)
  Anesthesia Post-op Note  Patient: Teacher, adult education  Procedure(s) Performed: Procedure(s) (LRB): SHOULDER ARTHROSCOPY WITH ROTATOR CUFF REPAIR (Left)  Patient Location: PACU  Anesthesia Type: GA combined with regional for post-op pain  Level of Consciousness: awake, alert  and oriented  Airway and Oxygen Therapy: Patient Spontanous Breathing  Post-op Pain: mild  Post-op Assessment: Post-op Vital signs reviewed  Post-op Vital Signs: Reviewed  Complications: No apparent anesthesia complications

## 2012-03-13 NOTE — Anesthesia Preprocedure Evaluation (Addendum)
Anesthesia Evaluation  Patient identified by MRN, date of birth, ID band Patient awake    Reviewed: Allergy & Precautions, H&P , NPO status , Patient's Chart, lab work & pertinent test results, reviewed documented beta blocker date and time   History of Anesthesia Complications Negative for: history of anesthetic complications  Airway Mallampati: II TM Distance: >3 FB Neck ROM: full    Dental   Pulmonary neg pulmonary ROS,          Cardiovascular hypertension, Pt. on medications and Pt. on home beta blockers + Valvular Problems/Murmurs     Neuro/Psych  Headaches, PSYCHIATRIC DISORDERS Depression  Neuromuscular disease    GI/Hepatic negative GI ROS, Neg liver ROS, GERD-  Medicated and Controlled,  Endo/Other  Diabetes mellitus-, Type 2, Oral Hypoglycemic AgentsHypothyroidism   Renal/GU negative Renal ROS  negative genitourinary   Musculoskeletal  (+) Fibromyalgia -  Abdominal   Peds  Hematology negative hematology ROS (+)   Anesthesia Other Findings See surgeon's H&P   Reproductive/Obstetrics negative OB ROS                          Anesthesia Physical Anesthesia Plan  ASA: III  Anesthesia Plan: General   Post-op Pain Management:    Induction: Intravenous  Airway Management Planned: Oral ETT  Additional Equipment:   Intra-op Plan:   Post-operative Plan: Extubation in OR  Informed Consent: I have reviewed the patients History and Physical, chart, labs and discussed the procedure including the risks, benefits and alternatives for the proposed anesthesia with the patient or authorized representative who has indicated his/her understanding and acceptance.   Dental Advisory Given  Plan Discussed with: CRNA and Surgeon  Anesthesia Plan Comments:         Anesthesia Quick Evaluation

## 2012-03-13 NOTE — Anesthesia Procedure Notes (Addendum)
Anesthesia Regional Block:  Interscalene brachial plexus block  Pre-Anesthetic Checklist: ,, timeout performed, Correct Patient, Correct Site, Correct Laterality, Correct Procedure, Correct Position, site marked, Risks and benefits discussed,  Surgical consent,  Pre-op evaluation,  At surgeon's request and post-op pain management  Laterality: Left  Prep: chloraprep       Needles:   Needle Type: Other   (Arrow Echogenic)   Needle Length: 9cm  Needle Gauge: 21    Additional Needles:  Procedures: ultrasound guided Interscalene brachial plexus block Narrative:  Start time: 03/13/2012 11:48 AM End time: 03/13/2012 11:55 AM Injection made incrementally with aspirations every 5 mL.  Performed by: Personally  Anesthesiologist: Aldona Lento, MD  Additional Notes: Ultrasound guidance used to: id relevant anatomy, confirm needle position, local anesthetic spread, avoidance of vascular puncture. Picture saved. No complications. Block performed personally by Janetta Hora. Gelene Mink, MD    Interscalene brachial plexus block Procedure Name: Intubation Date/Time: 03/13/2012 12:13 PM Performed by: Gar Gibbon Pre-anesthesia Checklist: Patient identified, Emergency Drugs available, Suction available and Patient being monitored Patient Re-evaluated:Patient Re-evaluated prior to inductionOxygen Delivery Method: Circle System Utilized Preoxygenation: Pre-oxygenation with 100% oxygen Intubation Type: IV induction Ventilation: Mask ventilation without difficulty Laryngoscope Size: Miller and 2 Grade View: Grade II Tube type: Oral Tube size: 7.0 mm Number of attempts: 1 Airway Equipment and Method: stylet and oral airway Placement Confirmation: ETT inserted through vocal cords under direct vision,  positive ETCO2 and breath sounds checked- equal and bilateral Tube secured with: Tape Dental Injury: Teeth and Oropharynx as per pre-operative assessment

## 2012-03-13 NOTE — Transfer of Care (Signed)
Immediate Anesthesia Transfer of Care Note  Patient: Teacher, adult education  Procedure(s) Performed: Procedure(s) (LRB): SHOULDER ARTHROSCOPY WITH ROTATOR CUFF REPAIR (Left)  Patient Location: PACU  Anesthesia Type: GA combined with regional for post-op pain  Level of Consciousness: sedated and patient cooperative  Airway & Oxygen Therapy: Patient Spontanous Breathing and Patient connected to face mask oxygen  Post-op Assessment: Report given to PACU RN and Post -op Vital signs reviewed and stable  Post vital signs: Reviewed and stable  Complications: No apparent anesthesia complications

## 2012-03-13 NOTE — Progress Notes (Signed)
Assisted Dr. Frederick with left, ultrasound guided, interscalene  block. Side rails up, monitors on throughout procedure. See vital signs in flow sheet. Tolerated Procedure well. 

## 2012-03-13 NOTE — H&P (Signed)
Allison Yoder is an 61 y.o. female.   Chief Complaint: L shoulder pain HPI: L shoulder rotator cuff tear, failed non op treatment with injections, rest, NSAIDS.  Past Medical History  Diagnosis Date  . Anxiety and depression   . Diabetes mellitus   . GERD (gastroesophageal reflux disease)   . Allergy   . Hypertension   . Hyperlipidemia   . Hypothyroidism   . History of chicken pox   . Migraines   . Urinary incontinence   . UTI (lower urinary tract infection)     recurrent  . Arthritis     L shoulder  . Iritis     per [facility]  . Diverticulosis   . Fatty liver   . Anemia   . Fibromyalgia   . Heart murmur     states dx 20 yr ago-very mild-n0 echo  . Complication of anesthesia     reaction to inapsine-caused extreme high bp  . Family history of anesthesia complication     mom reacts to inapsine same way pt does  . Snores   . Wears glasses     Past Surgical History  Procedure Date  . Abdominal hysterectomy 1992    Partial  . Bladder/pelvic tack 2001  . Breast surgery 1964    Biopsy, benign tumor, right  . Tubal ligation     Family History  Problem Relation Age of Onset  . Arthritis Maternal Grandmother   . Heart disease Paternal Uncle     x 2  . Stroke Maternal Grandmother   . Diabetes Maternal Grandmother   . Arthritis Sister   . Breast cancer Cousin   . Heart disease Paternal Aunt   . Diabetes Paternal Uncle   . Arthritis Mother   . Heart disease Mother   . Hyperlipidemia Mother   . Depression Mother   . Hypertension Mother   . Diabetes Mother   . Alcohol abuse Father   . Cancer Father     lung  . Diabetes Sister   . Diabetes Mother   . Depression Mother    Social History:  reports that she quit smoking about 26 years ago. Her smoking use included Cigarettes. She has a 10 pack-year smoking history. She has never used smokeless tobacco. She reports that she drinks alcohol. She reports that she does not use illicit  drugs.  Allergies:  Allergies  Allergen Reactions  . Cephalexin Itching  . Codeine Itching  . Inapsine (Droperidol) Shortness Of Breath, Anxiety and Hypertension    Elevated HR and BP, panic attack  . Lipitor (Atorvastatin Calcium)     Myalgias   . Niacin And Related     Flushing  . Pravastatin     Myalgias  . Adhesive (Tape) Rash    Blisters with tape    Medications Prior to Admission  Medication Sig Dispense Refill  . atenolol (TENORMIN) 50 MG tablet TAKE 1 TABLET BY MOUTH DAILY  30 tablet  4  . Calcium-Vitamin D 600-200 MG-UNIT per tablet Take 1 tablet by mouth 2 (two) times daily.      . cetirizine (ZYRTEC) 10 MG tablet Take 10 mg by mouth as needed.      . citalopram (CELEXA) 20 MG tablet Take 20 mg by mouth daily.      Marland Kitchen dicyclomine (BENTYL) 10 MG capsule Take 1 capsule (10 mg total) by mouth 3 (three) times daily as needed.  90 capsule  1  . ezetimibe-simvastatin (VYTORIN) 10-40 MG per  tablet Take 1 tablet by mouth at bedtime.  30 tablet  11  . fluticasone (FLONASE) 50 MCG/ACT nasal spray Place 2 sprays into the nose daily.      Marland Kitchen levothyroxine (SYNTHROID, LEVOTHROID) 25 MCG tablet Take 25 mcg by mouth daily.      Marland Kitchen lisinopril (PRINIVIL,ZESTRIL) 10 MG tablet TAKE 1 TABLET AT BEDTIME  30 tablet  6  . metFORMIN (GLUCOPHAGE) 500 MG tablet Take 500 mg by mouth 2 (two) times daily with a meal.      . Multiple Vitamin (MULTIVITAMIN) tablet Take 1 tablet by mouth daily.      . Omega-3 Fatty Acids (FISH OIL PO) Take 1 tablet by mouth 2 (two) times daily.      Marland Kitchen omeprazole (PRILOSEC) 20 MG capsule Take 20 mg by mouth daily.      Marland Kitchen trimethoprim (TRIMPEX) 100 MG tablet Take 100 mg by mouth daily.        Results for orders placed during the hospital encounter of 03/13/12 (from the past 48 hour(s))  POCT HEMOGLOBIN-HEMACUE     Status: Normal   Collection Time   03/13/12 11:35 AM      Component Value Range Comment   Hemoglobin 14.5  12.0 - 15.0 g/dL    No results found.  Review  of Systems  All other systems reviewed and are negative.    Blood pressure 160/92, pulse 76, temperature 98.1 F (36.7 C), temperature source Oral, resp. rate 17, height 5\' 7"  (1.702 m), weight 77.111 kg (170 lb), SpO2 100.00%. Physical Exam  Constitutional: She is oriented to person, place, and time. She appears well-developed and well-nourished.  HENT:  Head: Atraumatic.  Eyes: EOM are normal.  Cardiovascular: Intact distal pulses.   Respiratory: Effort normal.  Musculoskeletal:       Left shoulder: She exhibits tenderness.  Neurological: She is alert and oriented to person, place, and time.  Skin: Skin is warm and dry.  Psychiatric: She has a normal mood and affect.     Assessment/Plan L shoulder rotator cuff tear/impingement Plan RCR and SAD Risks / benefits of surgery discussed Consent on chart  NPO for OR Preop antibiotics   [name] 03/13/2012, 11:51 AM

## 2012-03-13 NOTE — Op Note (Signed)
Procedure(s): SHOULDER ARTHROSCOPY WITH ROTATOR CUFF REPAIR Procedure Note  Allison Yoder female 61 y.o. 03/13/2012  Procedure(s) and Anesthesia Type:    * SHOULDER ARTHROSCOPY WITH ROTATOR CUFF REPAIR - General  Surgeon(s) and Role:    * Mable Paris, MD - Primary     Surgeon: Mable Paris   Assistants: None  Anesthesia: General endotracheal anesthesia with preoperative interscalene block.    Procedure Detail  Left SHOULDER ARTHROSCOPY WITH ROTATOR CUFF REPAIR, subacromial decompression, debridement of anterior labral tearing and subscapularis partial thickness tearing.  Postoperative diagnosis: Left shoulder small anterior rotator cuff tear, impingement, anterior labral tearing and partial-thickness subscapularis tear appeared  Estimated Blood Loss: Min         Drains: none  Blood Given: none         Specimens: none        Complications:  * No complications entered in OR log *         Disposition: PACU - hemodynamically stable.         Condition: stable    Procedure:   INDICATIONS FOR SURGERY: The patient is 61 y.o. female who has over a year of left shoulder pain with MRI findings of full-thickness anterior rotator cuff tearing. She failed nonoperative management was to go forward with surgical treatment. She understood risks benefits alternatives to the procedure including but not limited to risk of incomplete pain relief, stiffness, nonhealing of the tendon. She understood and elected to go forward with surgery.  OPERATIVE FINDINGS: Examination under anesthesia: No stiffness or instability. Diagnostic Arthroscopy:  Glenoid articular cartilage: Intact Humeral head articular cartilage: Intact, small bubble posteriorly, no delamination. Labrum: Anterior labrum was partially torn, debrided back to stable labrum, no repair needed. Biceps tendon intact with mild fraying. Loose bodies: None Synovitis: Moderate Articular sided rotator  cuff: Small full-thickness anterior supraspinatus tear just posterior to the biceps tendon. There measured approximately 1.5 cm.  Coracoacromial ligament: Significantly frayed, large hooked anterior acromial spur.  DESCRIPTION OF PROCEDURE: The patient was identified in preoperative  holding area where I personally marked the operative site after  verifying site, side, and procedure with the patient. An interscalene block was given by the attending anesthesiologist the holding area.  The patient was taken back to the operating room where general anesthesia was induced without complication and was placed in the beach-chair position with the back  elevated about 60 degrees and all extremities and head and neck carefully padded and  positioned.   The left upper extremity was then prepped and  draped in a standard sterile fashion. The appropriate time-out  procedure was carried out. The patient did receive IV antibiotics  within 30 minutes of incision.   A small posterior portal incision was made and the arthroscope was introduced into the joint. An anterior portal was then established above the subscapularis using needle localization. Small cannula was placed anteriorly. Diagnostic arthroscopy was then carried out with findings as described above.  The shaver was used through the anterior portal to debride the anterior labral tear. This was debrided back to stable anterior labrum which is not displaceable and was not felt to need repair. The biceps was pulled into the joint and was partially frayed but did not require treatment. There is no surrounding tenosynovitis. The subscapularis insertion was partially elevated about 25%. This was debrided back to healthy appearing tendon which bled. Has not felt any formal repair was necessary. The undersurface of the rotator cuff was carefully examined and noted  to have full-thickness tearing anterior was some delamination. This was marked with a PDS suture  percutaneously for later identification the subacromial space.  The arthroscope was then introduced into the subacromial space a standard lateral portal was established with needle localization. The shaver was used through the lateral portal to perform extensive bursectomy. Coracoacromial ligament was examined and found to be severely frayed..  The rotator cuff tear was identified on the bursal surface at the point of the PDS suture. This was exposed. There is a few strands of thin tissue covering the tear but once it was opened it was clear that this was a full-thickness tear. The greater tuberosity was exposed using a combination ArthroCare and shaver and then the undersurface of the cuff edge was debrided back to healthy appearing tendon. There is a small piece of calcium deposit or possibly remnant steroid from an injection in the anterior cuff tendon which was debrided. The bur was used on the greater tuberosity to debris down to bleeding bone to promote healing. A percutaneous stab incision was made just lateral to the acromial edge and a 5.5 mm peak corkscrew anchor was placed which was double loaded. Using a scorpion suture passer from posterior to anterior all 4 suture limbs were passed in an even spread and the 2 horizontal mattress sutures were tied down bringing the cuff edge nicely down to the prepared tuberosity. The repair was viewed from posterior and lateral portals and felt to be excellent.   The coracoacromial ligament was taken down off the anterior acromion with the ArthroCare exposing a large hooked anterior acromial spur. A 4 mm high-speed bur was then used through the lateral portal to take down the anterior acromial spur from lateral to medial in a standard acromioplasty.  The acromioplasty was also viewed from the lateral portal and the bur was used as necessary to ensure that the acromion was completely flat from posterior to anterior.  The arthroscopic equipment was removed from  the joint and the portals were closed with 3-0 nylon in an interrupted fashion. Sterile dressings were then applied including Xeroform 4 x 4's ABDs and tape. The patient was then allowed to awaken from general anesthesia, placed in a sling, transferred to the stretcher and taken to the recovery room in stable condition.   POSTOPERATIVE PLAN: The patient will be discharged home today and will followup in one week for suture removal and wound check.  She will remain in a sling for 6 weeks postoperatively.

## 2012-04-27 ENCOUNTER — Other Ambulatory Visit: Payer: Self-pay | Admitting: Internal Medicine

## 2012-04-27 MED ORDER — CIPROFLOXACIN HCL 500 MG PO TABS: 500.0000 mg | ORAL_TABLET | Freq: Two times a day (BID) | ORAL | Status: AC

## 2012-04-27 MED ORDER — METRONIDAZOLE 250 MG PO TABS: ORAL_TABLET | ORAL | Status: DC

## 2012-04-27 NOTE — Telephone Encounter (Signed)
LOV 01/2012, hx of diverticulitis, 4 attacke in last few years. Please send Cipro 500 mg po bid x 10 days, #20 . No refill, Flagyl 250 mg ,1 poqid x 10 days, #30. I fno improvement, call back. Stay on full liquids till pain subsides

## 2012-04-27 NOTE — Telephone Encounter (Signed)
Patient calling to report left sided abdominal pain starting last night. Feels like it does when she has diverticulitis. Denies fever, diarrhea or constipation. She is asking for Cipro for diverticulitis and Diflucan for yeast infection which she gets from taking Cipro. Please, advise.

## 2012-04-27 NOTE — Telephone Encounter (Signed)
Spoke with patient and gave her Dr. Brodie's recommendations. Rx sent to pharmacy. 

## 2012-04-30 ENCOUNTER — Other Ambulatory Visit: Payer: Self-pay | Admitting: *Deleted

## 2012-04-30 MED ORDER — METFORMIN HCL 500 MG PO TABS: 500.0000 mg | ORAL_TABLET | Freq: Two times a day (BID) | ORAL | Status: DC

## 2012-05-11 ENCOUNTER — Ambulatory Visit (INDEPENDENT_AMBULATORY_CARE_PROVIDER_SITE_OTHER): Payer: BC Managed Care – PPO | Admitting: Family Medicine

## 2012-05-11 ENCOUNTER — Encounter: Payer: Self-pay | Admitting: Family Medicine

## 2012-05-11 VITALS — BP 102/66 | HR 87 | Temp 98.2°F | Wt 179.0 lb

## 2012-05-11 DIAGNOSIS — N649 Disorder of breast, unspecified: Secondary | ICD-10-CM

## 2012-05-11 DIAGNOSIS — R234 Changes in skin texture: Secondary | ICD-10-CM

## 2012-05-11 NOTE — Progress Notes (Signed)
Lesion noted in the shower about 1 week ago.  Looked like a red spot on the side of her R breast.  Not itchy, tender. No FCNAVD. Feels well o/w.  Doing well after shoulder surgery.  Last mammogram >12 months. No lesions on L breast.   Meds, vitals, and allergies reviewed.   ROS: See HPI.  Otherwise, noncontributory.  nad ncat Breast exam: No mass, nodules, thickening, tenderness, bulging, retraction, inflamation, nipple discharge or skin changes noted except for 3cm red macule on the R lateral breast a 9 o'clock (along with 2 very small red macules nearby).  No axillary or clavicular LA.  Chaperoned exam.

## 2012-05-11 NOTE — Patient Instructions (Addendum)
See Marion about your referral before you leave today. 

## 2012-05-15 ENCOUNTER — Telehealth: Payer: Self-pay | Admitting: Family Medicine

## 2012-05-15 DIAGNOSIS — N649 Disorder of breast, unspecified: Secondary | ICD-10-CM | POA: Insufficient documentation

## 2012-05-15 NOTE — Telephone Encounter (Signed)
Patient notified as instructed by telephone. 

## 2012-05-15 NOTE — Assessment & Plan Note (Signed)
Due for mammogram.  Refer.  She was also asking about breast reductions.  I'll check on this for her.

## 2012-05-15 NOTE — Telephone Encounter (Signed)
Please call pt. I checked with a surgeon at [facility].  They do not do breast reductions at [facility]. The [facility] in [place] will do them. I would check with the clinics for [facility], [facility], [facility].  Thanks .

## 2012-05-25 ENCOUNTER — Ambulatory Visit
Admission: RE | Admit: 2012-05-25 | Discharge: 2012-05-25 | Disposition: A | Discharge status: Home / Self Care | Payer: BC Managed Care – PPO | Source: Ambulatory Visit | Attending: Family Medicine | Admitting: Family Medicine

## 2012-05-25 DIAGNOSIS — R234 Changes in skin texture: Secondary | ICD-10-CM

## 2012-05-26 ENCOUNTER — Other Ambulatory Visit: Payer: Self-pay | Admitting: Family Medicine

## 2012-05-28 ENCOUNTER — Encounter: Payer: Self-pay | Admitting: *Deleted

## 2012-06-18 ENCOUNTER — Other Ambulatory Visit (INDEPENDENT_AMBULATORY_CARE_PROVIDER_SITE_OTHER): Payer: BC Managed Care – PPO

## 2012-06-18 DIAGNOSIS — E119 Type 2 diabetes mellitus without complications: Secondary | ICD-10-CM

## 2012-06-18 LAB — COMPREHENSIVE METABOLIC PANEL
ALT: 23 U/L (ref 0–35)
AST: 22 U/L (ref 0–37)
Albumin: 4.1 g/dL (ref 3.5–5.2)
Alkaline Phosphatase: 60 U/L (ref 39–117)
BUN: 22 mg/dL (ref 6–23)
CO2: 28 mEq/L (ref 19–32)
Calcium: 9.2 mg/dL (ref 8.4–10.5)
Chloride: 102 mEq/L (ref 96–112)
Creatinine, Ser: 0.9 mg/dL (ref 0.4–1.2)
GFR: 67.45 mL/min (ref >60.00)
Glucose, Bld: 99 mg/dL (ref 70–99)
Potassium: 4.3 mEq/L (ref 3.5–5.1)
Sodium: 135 mEq/L (ref 135–145)
Total Bilirubin: 0.8 mg/dL (ref 0.3–1.2)
Total Protein: 7.2 g/dL (ref 6.0–8.3)

## 2012-06-18 LAB — LIPID PANEL
Cholesterol: 140 mg/dL (ref 0–200)
HDL: 35.6 mg/dL — ABNORMAL LOW (ref >39.00)
LDL Cholesterol: 72 mg/dL (ref 0–99)
Total CHOL/HDL Ratio: 4
Triglycerides: 162 mg/dL — ABNORMAL HIGH (ref 0.0–149.0)
VLDL: 32.4 mg/dL (ref 0.0–40.0)

## 2012-06-18 LAB — HEMOGLOBIN A1C: Hgb A1c MFr Bld: 6.1 % (ref 4.6–6.5)

## 2012-06-18 LAB — TSH: TSH: 4.1 u[IU]/mL (ref 0.35–5.50)

## 2012-06-22 ENCOUNTER — Ambulatory Visit: Payer: BC Managed Care – PPO | Admitting: Family Medicine

## 2012-06-25 ENCOUNTER — Ambulatory Visit: Payer: BC Managed Care – PPO | Admitting: Family Medicine

## 2012-06-25 ENCOUNTER — Encounter: Payer: Self-pay | Admitting: Family Medicine

## 2012-06-25 ENCOUNTER — Ambulatory Visit (INDEPENDENT_AMBULATORY_CARE_PROVIDER_SITE_OTHER): Payer: BC Managed Care – PPO | Admitting: Family Medicine

## 2012-06-25 VITALS — BP 116/82 | HR 77 | Temp 98.0°F | Wt 179.0 lb

## 2012-06-25 DIAGNOSIS — E785 Hyperlipidemia, unspecified: Secondary | ICD-10-CM

## 2012-06-25 DIAGNOSIS — E119 Type 2 diabetes mellitus without complications: Secondary | ICD-10-CM

## 2012-06-25 DIAGNOSIS — Z23 Encounter for immunization: Secondary | ICD-10-CM

## 2012-06-25 DIAGNOSIS — E039 Hypothyroidism, unspecified: Secondary | ICD-10-CM

## 2012-06-25 MED ORDER — ZOSTER VACCINE LIVE 19400 UNT/0.65ML ~~LOC~~ SOLR: 0.6500 mL | Freq: Once | SUBCUTANEOUS | Status: DC

## 2012-06-25 NOTE — Patient Instructions (Addendum)
Go to the lab on the way out.  We'll contact you with your lab report. Take care.   Recheck in 6 months with labs ahead of time.  Glad to see you.  Check with your insurance to see if they will cover the shingles shot.

## 2012-06-25 NOTE — Progress Notes (Signed)
Diabetes:  Using medications without difficulties:yes Hypoglycemic episodes:no Hyperglycemic episodes:no Feet problems:no Blood Sugars averaging: ~140, this is higher than prev eye exam within last year:yes Exercise is limited by shoulder pain.  She's had some mild hot flashes but not night sweats  Elevated Cholesterol: Using medications without problems:yes Muscle aches: no Diet compliance:yes Exercise: limited by shoulder pain.  Hypothyroid.  TSH wnl.  No neck mass, no dysphagia.    Has f/u with eye clinic for eyelid lag.   Has f/u with uro pending re: TMP use.    She need HBV titer for work.  See notes on labs.    PMH and SH reviewed  Meds, vitals, and allergies reviewed.   ROS: See HPI.  Otherwise negative.    GEN: nad, alert and oriented HEENT: mucous membranes moist NECK: supple w/o LA CV: rrr. PULM: ctab, no inc wob ABD: soft, +bs EXT: no edema SKIN: no acute rash  Diabetic foot exam: Normal inspection No skin breakdown No calluses  Normal DP pulses Normal sensation to light touch and monofilament Nails normal

## 2012-06-26 LAB — HEPATITIS B SURFACE ANTIBODY,QUALITATIVE: Hep B S Ab: NEGATIVE

## 2012-06-26 NOTE — Assessment & Plan Note (Signed)
With inc in TG likely related to dec in activity.  She'll inc exercise as shoulder pain improves

## 2012-06-26 NOTE — Assessment & Plan Note (Signed)
TSH wnl, continue current dose of replacement.

## 2012-06-26 NOTE — Assessment & Plan Note (Signed)
Controlled, continue metformin.  Lack of exercise due to shoulder pain is influencing her sugar.  Recheck 6 months.

## 2012-06-29 ENCOUNTER — Ambulatory Visit: Payer: BC Managed Care – PPO | Admitting: Family Medicine

## 2012-07-06 ENCOUNTER — Other Ambulatory Visit: Payer: Self-pay | Admitting: Internal Medicine

## 2012-07-06 ENCOUNTER — Other Ambulatory Visit: Payer: Self-pay | Admitting: Family Medicine

## 2012-07-24 ENCOUNTER — Other Ambulatory Visit: Payer: Self-pay | Admitting: Family Medicine

## 2012-08-02 ENCOUNTER — Ambulatory Visit: Payer: BC Managed Care – PPO

## 2012-08-24 ENCOUNTER — Other Ambulatory Visit: Payer: Self-pay | Admitting: Family Medicine

## 2012-08-31 ENCOUNTER — Encounter: Payer: Self-pay | Admitting: Family Medicine

## 2012-08-31 ENCOUNTER — Ambulatory Visit (INDEPENDENT_AMBULATORY_CARE_PROVIDER_SITE_OTHER): Payer: BC Managed Care – PPO | Admitting: Family Medicine

## 2012-08-31 VITALS — BP 116/82 | HR 85 | Temp 98.1°F | Ht 67.0 in | Wt 181.0 lb

## 2012-08-31 DIAGNOSIS — F411 Generalized anxiety disorder: Secondary | ICD-10-CM

## 2012-08-31 DIAGNOSIS — L989 Disorder of the skin and subcutaneous tissue, unspecified: Secondary | ICD-10-CM

## 2012-08-31 DIAGNOSIS — F419 Anxiety disorder, unspecified: Secondary | ICD-10-CM

## 2012-08-31 MED ORDER — HYDROCORTISONE ACETATE 25 MG RE SUPP: 25.0000 mg | Freq: Two times a day (BID) | RECTAL | Status: DC

## 2012-08-31 MED ORDER — CLONAZEPAM 0.5 MG PO TABS: 0.2500 mg | ORAL_TABLET | Freq: Two times a day (BID) | ORAL | Status: DC | PRN

## 2012-08-31 MED ORDER — ZOSTER VACCINE LIVE 19400 UNT/0.65ML ~~LOC~~ SOLR: 0.6500 mL | Freq: Once | SUBCUTANEOUS | Status: DC

## 2012-08-31 NOTE — Progress Notes (Signed)
Very small lump on R anterior chest, just right of midline, near medial end of clavicle.  Unknown duration.  About the size of a pea.  No other symptoms- no FCNAVD.  No breast mass or symptoms o/w.  Mammogram wnl earlier in 2013  Son is reportedly a drug addict and this has been very stressful for patient, she's more anxious recently.  He's had trouble with drugs since ~1997.  Rehab x2, w/o success.  She has kicked him out of her home.  She is safe at home.  She continues on celexa.    Meds, vitals, and allergies reviewed.   ROS: See HPI.  Otherwise, noncontributory.  nad ncat  Neck supple, no LA Very small lump on R anterior chest, just right of midline, near medial end of clavicle, feels to be <1cm, very small. Mobile in the skin.

## 2012-08-31 NOTE — Patient Instructions (Addendum)
I would keep an eye on this and let us know if it gets bigger, more tender, or if you have another concerning spot.

## 2012-09-01 DIAGNOSIS — L989 Disorder of the skin and subcutaneous tissue, unspecified: Secondary | ICD-10-CM | POA: Insufficient documentation

## 2012-09-01 DIAGNOSIS — F419 Anxiety disorder, unspecified: Secondary | ICD-10-CM | POA: Insufficient documentation

## 2012-09-01 DIAGNOSIS — F32A Depression, unspecified: Secondary | ICD-10-CM | POA: Insufficient documentation

## 2012-09-01 NOTE — Assessment & Plan Note (Signed)
Very small w/o red flag symptoms or finding.  Could be a very small lymph node, more likely a benign cyst or scar tissue.  Would follow clinically she agrees.

## 2012-09-01 NOTE — Assessment & Plan Note (Signed)
Related to son's drug addiction.  Continue SSRI, add on clonazepam prn for anxious moments, with goal of rare use.  She agrees. Sedation caution.

## 2012-09-10 ENCOUNTER — Ambulatory Visit (INDEPENDENT_AMBULATORY_CARE_PROVIDER_SITE_OTHER): Payer: BC Managed Care – PPO | Admitting: *Deleted

## 2012-09-10 DIAGNOSIS — Z23 Encounter for immunization: Secondary | ICD-10-CM

## 2012-09-10 DIAGNOSIS — Z2911 Encounter for prophylactic immunotherapy for respiratory syncytial virus (RSV): Secondary | ICD-10-CM

## 2012-11-05 ENCOUNTER — Telehealth: Payer: Self-pay | Admitting: Family Medicine

## 2012-11-05 MED ORDER — ONDANSETRON HCL 4 MG PO TABS: 4.0000 mg | ORAL_TABLET | Freq: Three times a day (TID) | ORAL | Status: DC | PRN

## 2012-11-05 NOTE — Telephone Encounter (Signed)
Pt advised.  Rx phoned to pharmacy. 

## 2012-11-05 NOTE — Telephone Encounter (Signed)
Patient Information:  Caller Name: Kealy  Phone: [phone]  Patient: Allison Yoder  Gender: Female  DOB: 1951/03/15  Age: 62 Years  PCP: Crawford Givens Clelia Croft) [facility])  Office Follow Up:  Does the office need to follow up with this patient?: No  Instructions For The Office: N/A   Symptoms  Reason For Call & Symptoms: GI Viral symptoms, slowed diarrhea with Imodium.  Low grade fever and aching, improving.  Currently denies fever.   "Stomach feels like it's in a vise."  Loose stools persist.  Abdominal pain rated at 2-3 of 10 and intermittent.  Caller  reports nausea is the worst current symptom.  Requested Rx for Nausea, but declined appointment. States she will follow up if nausea not relieved.  by 11/06/12.  Reviewed Health History In EMR: Yes  Reviewed Medications In EMR: Yes  Reviewed Allergies In EMR: Yes  Reviewed Surgeries / Procedures: Yes  Date of Onset of Symptoms: 10/25/2012  Treatments Tried: Clear liquids, soft foods  Treatments Tried Worked: No  Guideline(s) Used:  Vomiting  Disposition Per Guideline:   Home Care  Reason For Disposition Reached:   Vomiting with diarrhea  Advice Given:  Reassurance:  Vomiting can be caused by many types of illnesses. It can be caused by a stomach flu virus. It can be caused by eating or drinking something that disagreed with your stomach.  Adults with vomiting need to stay hydrated. This is the most important thing. If you don't drink and replace lost fluids, you may get dehydrated.  You can treat vomiting, even if there is mild dehydration, at home.  Here is some care advice that should help.  Clear Liquids:  Sip water or a rehydration drink (e.g., Gatorade or Powerade).  Other options: 1/2 strength flat lemon-lime soda or ginger ale.  After 4 hours without vomiting, increase the amount.  For Non-stop Vomiting, Try Sleeping:  Try to go to sleep (Reason: sleep often empties the stomach and relieves the need to  vomit).  Avoid Nonprescription Medicines:  Stop all nonprescription medicines for 24 hours (Reason: they may make vomiting worse).  Expected Course:  Vomiting from viral gastritis usually stops in 12 to 48 hours.  If diarrhea is present, it usually lasts for several days.  People with moderate to severe dehydration may need medical care. signs of this include very dry mouth, dizziness, weakness, and decreased urination.

## 2012-11-05 NOTE — Telephone Encounter (Signed)
pcp

## 2012-11-05 NOTE — Telephone Encounter (Signed)
It would be reasonable to try zofran.  Please call in if desired by patient.  If not, please take of med list.  Thanks.

## 2012-12-14 ENCOUNTER — Other Ambulatory Visit: Payer: Self-pay | Admitting: *Deleted

## 2012-12-14 ENCOUNTER — Telehealth: Payer: Self-pay | Admitting: Internal Medicine

## 2012-12-14 ENCOUNTER — Ambulatory Visit: Payer: BC Managed Care – PPO | Admitting: Family Medicine

## 2012-12-14 ENCOUNTER — Encounter: Payer: Self-pay | Admitting: Nurse Practitioner

## 2012-12-14 ENCOUNTER — Other Ambulatory Visit: Payer: Self-pay | Admitting: Family Medicine

## 2012-12-14 ENCOUNTER — Ambulatory Visit (INDEPENDENT_AMBULATORY_CARE_PROVIDER_SITE_OTHER): Payer: BC Managed Care – PPO | Admitting: Nurse Practitioner

## 2012-12-14 ENCOUNTER — Telehealth: Payer: Self-pay | Admitting: Nurse Practitioner

## 2012-12-14 ENCOUNTER — Other Ambulatory Visit (INDEPENDENT_AMBULATORY_CARE_PROVIDER_SITE_OTHER): Payer: BC Managed Care – PPO

## 2012-12-14 DIAGNOSIS — G8929 Other chronic pain: Secondary | ICD-10-CM

## 2012-12-14 DIAGNOSIS — R1032 Left lower quadrant pain: Secondary | ICD-10-CM

## 2012-12-14 DIAGNOSIS — R109 Unspecified abdominal pain: Secondary | ICD-10-CM

## 2012-12-14 DIAGNOSIS — E119 Type 2 diabetes mellitus without complications: Secondary | ICD-10-CM

## 2012-12-14 DIAGNOSIS — R1031 Right lower quadrant pain: Secondary | ICD-10-CM

## 2012-12-14 DIAGNOSIS — R197 Diarrhea, unspecified: Secondary | ICD-10-CM

## 2012-12-14 LAB — URINALYSIS, ROUTINE W REFLEX MICROSCOPIC
Bilirubin Urine: NEGATIVE
Hgb urine dipstick: NEGATIVE
Ketones, ur: NEGATIVE
Nitrite: NEGATIVE
Specific Gravity, Urine: 1.02 (ref 1.000–1.030)
Total Protein, Urine: NEGATIVE
Urine Glucose: NEGATIVE
Urobilinogen, UA: 0.2 (ref 0.0–1.0)
pH: 7 (ref 5.0–8.0)

## 2012-12-14 LAB — CBC WITH DIFFERENTIAL/PLATELET
Basophils Absolute: 0.1 10*3/uL (ref 0.0–0.1)
Basophils Relative: 0.9 % (ref 0.0–3.0)
Eosinophils Absolute: 0.1 10*3/uL (ref 0.0–0.7)
Eosinophils Relative: 1.4 % (ref 0.0–5.0)
HCT: 43 % (ref 36.0–46.0)
Hemoglobin: 14.4 g/dL (ref 12.0–15.0)
Lymphocytes Relative: 29.9 % (ref 12.0–46.0)
Lymphs Abs: 2.8 10*3/uL (ref 0.7–4.0)
MCHC: 33.3 g/dL (ref 30.0–36.0)
MCV: 89.5 fl (ref 78.0–100.0)
Monocytes Absolute: 0.7 10*3/uL (ref 0.1–1.0)
Monocytes Relative: 6.9 % (ref 3.0–12.0)
Neutro Abs: 5.7 10*3/uL (ref 1.4–7.7)
Neutrophils Relative %: 60.9 % (ref 43.0–77.0)
Platelets: 245 10*3/uL (ref 150.0–400.0)
RBC: 4.81 Mil/uL (ref 3.87–5.11)
RDW: 13.8 % (ref 11.5–14.6)
WBC: 9.4 10*3/uL (ref 4.5–10.5)

## 2012-12-14 MED ORDER — METRONIDAZOLE 250 MG PO TABS: ORAL_TABLET | ORAL | Status: DC

## 2012-12-14 MED ORDER — FLUCONAZOLE 100 MG PO TABS: ORAL_TABLET | ORAL | Status: DC

## 2012-12-14 MED ORDER — TRAMADOL HCL 50 MG PO TABS: 50.0000 mg | ORAL_TABLET | Freq: Four times a day (QID) | ORAL | Status: DC | PRN

## 2012-12-14 MED ORDER — PROMETHAZINE HCL 12.5 MG PO TABS: 12.5000 mg | ORAL_TABLET | Freq: Four times a day (QID) | ORAL | Status: DC | PRN

## 2012-12-14 MED ORDER — PROMETHAZINE HCL 12.5 MG PO TABS: 12.5000 mg | ORAL_TABLET | ORAL | Status: DC | PRN

## 2012-12-14 MED ORDER — CIPROFLOXACIN HCL 250 MG PO TABS: 250.0000 mg | ORAL_TABLET | Freq: Two times a day (BID) | ORAL | Status: DC

## 2012-12-14 NOTE — Telephone Encounter (Signed)
Patient calling to report abdominal cramping and multiple bowel movements that started this morning. States she has had 4 bowel movements this morning that are formed and without bleeding. She reports abdominal cramping like menstrual cramps. Rates the pain at 7/8 on pain scale. Denies fever. Reports she has slight nausea.States her abdomen is not sore like it usually is with diverticulitis flare. Scheduled with Willette Cluster, NP today at 11:00 AM.

## 2012-12-14 NOTE — Telephone Encounter (Signed)
I called the patient and left a message apologizing that I did not send the Phenergan 12.5 mg prescription .  I told her I just got off the phone with the pharmacist. I called it in.  She can pick it up at her convenience.

## 2012-12-14 NOTE — Patient Instructions (Addendum)
Drink clear liquids until feeling better. Please go to the basement level to have your labs drawn and do a stool study. We sent prescriptions to [facility] [address] for Ultram, Diflucan, Cipro, and Flagyl. Phenergan for nausea also.  This can make you drowsy.

## 2012-12-17 ENCOUNTER — Other Ambulatory Visit: Payer: BC Managed Care – PPO

## 2012-12-17 DIAGNOSIS — R109 Unspecified abdominal pain: Secondary | ICD-10-CM | POA: Insufficient documentation

## 2012-12-17 DIAGNOSIS — R1032 Left lower quadrant pain: Secondary | ICD-10-CM

## 2012-12-17 DIAGNOSIS — R197 Diarrhea, unspecified: Secondary | ICD-10-CM | POA: Insufficient documentation

## 2012-12-17 DIAGNOSIS — R1013 Epigastric pain: Secondary | ICD-10-CM | POA: Insufficient documentation

## 2012-12-17 NOTE — Progress Notes (Signed)
Reviewed and agree, consider colonoscopy/biopsy to r/o microscopic colitis vs CT addomen to look for inflammatory changes in left colon

## 2012-12-17 NOTE — Progress Notes (Signed)
12/17/2012 Indigo Arline Ketter [id number] August 15, 1951   History of Present Illness:  Patient is a 62 year old female seen by Dr. Juanda Chance in May 2013 for dysphagia / odynophagia. She was previously followed by GI in [place]. At the time of her last visit patient was treated empirically for recurrent candida esophagitis.  Patient here today for evaluation of abdominal pain and increased lose stools. Lower abdominal pain feels like menstrual cramps. She typically has loose stools but this am has had twice her normal frequency. No blood in stool. No recent antibiotics. No urinary symptoms. No fevers. Pain reminiscent of diverticulitis.   Current Medications, Allergies, Past Medical History, Past Surgical History, Family History and Social History were reviewed in [employer] record.   Physical Exam: General: Well developed , white female in no acute distress Head: Normocephalic and atraumatic Eyes:  sclerae anicteric, conjunctiva pink  Ears: Normal auditory acuity Lungs: Clear throughout to auscultation Heart: Regular rate and rhythm Abdomen: Mild LLQ and mid lower abdominal  Tenderness. Non- distended. No masses, no hepatomegaly. Normal bowel sounds Musculoskeletal: Symmetrical with no gross deformities  Extremities: No edema  Neurological: Alert oriented x 4, grossly nonfocal Psychological:  Alert and cooperative. Normal mood and affect  Assessment and Recommendations:  Lower abdominal pain, suspect recurrent diverticulitis but with increased loose stools cannot exclude infectious etiology. Will treat for presumed diverticulitis but check stool studies, urinalysis and labs as well. Trial of Ultram for pain. Will call patient early next week with results and to get a condition update.

## 2012-12-18 LAB — CLOSTRIDIUM DIFFICILE BY PCR: Toxigenic C. Difficile by PCR: NOT DETECTED

## 2012-12-24 ENCOUNTER — Other Ambulatory Visit (INDEPENDENT_AMBULATORY_CARE_PROVIDER_SITE_OTHER): Payer: BC Managed Care – PPO

## 2012-12-24 ENCOUNTER — Telehealth: Payer: Self-pay | Admitting: Family Medicine

## 2012-12-24 DIAGNOSIS — E119 Type 2 diabetes mellitus without complications: Secondary | ICD-10-CM

## 2012-12-24 DIAGNOSIS — E039 Hypothyroidism, unspecified: Secondary | ICD-10-CM

## 2012-12-24 LAB — LIPID PANEL
Cholesterol: 137 mg/dL (ref 0–200)
HDL: 30.7 mg/dL — ABNORMAL LOW (ref >39.00)
LDL Cholesterol: 84 mg/dL (ref 0–99)
Total CHOL/HDL Ratio: 4
Triglycerides: 113 mg/dL (ref 0.0–149.0)
VLDL: 22.6 mg/dL (ref 0.0–40.0)

## 2012-12-24 LAB — TSH: TSH: 3.93 u[IU]/mL (ref 0.35–5.50)

## 2012-12-24 LAB — HEMOGLOBIN A1C: Hgb A1c MFr Bld: 6.3 % (ref 4.6–6.5)

## 2012-12-24 NOTE — Telephone Encounter (Signed)
Pt stopped levothyroxine.  Rechecked TSH today.

## 2012-12-28 ENCOUNTER — Ambulatory Visit: Payer: BC Managed Care – PPO | Admitting: Family Medicine

## 2012-12-31 ENCOUNTER — Ambulatory Visit: Payer: BC Managed Care – PPO | Admitting: Family Medicine

## 2013-01-01 ENCOUNTER — Ambulatory Visit: Payer: BC Managed Care – PPO | Admitting: Family Medicine

## 2013-01-04 ENCOUNTER — Ambulatory Visit (INDEPENDENT_AMBULATORY_CARE_PROVIDER_SITE_OTHER): Payer: BC Managed Care – PPO | Admitting: Family Medicine

## 2013-01-04 ENCOUNTER — Encounter: Payer: Self-pay | Admitting: Family Medicine

## 2013-01-04 VITALS — BP 138/80 | HR 81 | Temp 98.2°F | Wt 183.5 lb

## 2013-01-04 DIAGNOSIS — M0230 Reiter's disease, unspecified site: Secondary | ICD-10-CM

## 2013-01-04 DIAGNOSIS — R002 Palpitations: Secondary | ICD-10-CM

## 2013-01-04 DIAGNOSIS — R209 Unspecified disturbances of skin sensation: Secondary | ICD-10-CM

## 2013-01-04 DIAGNOSIS — E039 Hypothyroidism, unspecified: Secondary | ICD-10-CM

## 2013-01-04 DIAGNOSIS — F329 Major depressive disorder, single episode, unspecified: Secondary | ICD-10-CM

## 2013-01-04 DIAGNOSIS — E785 Hyperlipidemia, unspecified: Secondary | ICD-10-CM

## 2013-01-04 DIAGNOSIS — E119 Type 2 diabetes mellitus without complications: Secondary | ICD-10-CM

## 2013-01-04 DIAGNOSIS — R202 Paresthesia of skin: Secondary | ICD-10-CM

## 2013-01-04 DIAGNOSIS — H209 Unspecified iridocyclitis: Secondary | ICD-10-CM

## 2013-01-04 NOTE — Patient Instructions (Addendum)
See Shirlee Limerick about your referral before you leave today. Recheck in 6 months, labs ahead of time.  Keep exercising.   Cut out as much caffeine as possible and if you continue to have symptoms then let us know.  Take care.

## 2013-01-04 NOTE — Progress Notes (Signed)
Her abd sx resolved. She didn't need to take the rx'd abx.    TSH is normal. She stopped the synthroid.  TSH is similar to prev.  She had fatigue earlier in the year (on replacement), but the fatigue is not as bad now.  We agreed to recheck the TSH later in the year, sooner if needed.  We discussed the variable nature/course of hypothyroidism.  No neck symptoms.    Diabetes:  Using medications without difficulties:  yes Hypoglycemic episodes:no Hyperglycemic episodes:no Feet problems:no Blood Sugars averaging: 130, but then lower midmorning.   A1c 6.3.   Last eye exam was 10/13.   Elevated Cholesterol: Using medications without problems:yes Muscle aches: no Diet compliance: no Exercise: she is working on this.  She just [employer].   She has a h/o Reiter's syndrome.  Iritis, urethritis prev known.  She is + HLA B27.  She has been having more wrist ankle and knee pain. It can be variable, on either side but not always at the same time.  She has the sensation of skipped beats in her chest, but not palpitations/racing.  The episodes can happen for hours.  She has frequent loose stools.    She has had eyelid surgery in 10/13.  She has in the interval had episodic feeling of "crawlers" "like walking through spider webs" under the skin B in the maxillary regions but not on the nose, lips or chin.  No intraoral sx.    PMH and SH reviewed  Meds, vitals, and allergies reviewed.   ROS: See HPI.  Otherwise negative.    GEN: nad, alert and oriented HEENT: mucous membranes moist NECK: supple w/o LA CV: rrr. PULM: ctab, no inc wob ABD: soft, +bs EXT: no edema SKIN: no acute rash CN 2-12 wnl B, S/S/DTR wnl x4  Diabetic foot exam: Normal inspection No skin breakdown No calluses  Normal DP pulses Normal sensation to light touch and monofilament Nails normal

## 2013-01-06 ENCOUNTER — Other Ambulatory Visit: Payer: Self-pay | Admitting: Family Medicine

## 2013-01-06 DIAGNOSIS — R202 Paresthesia of skin: Secondary | ICD-10-CM | POA: Insufficient documentation

## 2013-01-06 DIAGNOSIS — R002 Palpitations: Secondary | ICD-10-CM | POA: Insufficient documentation

## 2013-01-06 NOTE — Assessment & Plan Note (Signed)
Refer to rheum.  She has not active, hot synovitis on exam today.

## 2013-01-06 NOTE — Assessment & Plan Note (Signed)
Controlled, continue as is.  D/w pt about diet and exercise.  

## 2013-01-06 NOTE — Assessment & Plan Note (Signed)
Controlled, continue as is. She agrees.  

## 2013-01-06 NOTE — Assessment & Plan Note (Signed)
Her son has been doing better and this is a relief for the patient.

## 2013-01-06 NOTE — Assessment & Plan Note (Signed)
Will follow, no TMG on exam today.  Off replacement.

## 2013-01-06 NOTE — Assessment & Plan Note (Signed)
This could be from the eyelid surgery.  No sx today and I would follow this clinically.  She agrees. >40 min spent with face to face with patient.

## 2013-01-06 NOTE — Assessment & Plan Note (Signed)
occ PVC on EKG w/o other concerning findings. Would dec caffeine and f/u prn. She agrees.

## 2013-03-11 ENCOUNTER — Other Ambulatory Visit: Payer: Self-pay | Admitting: Family Medicine

## 2013-07-05 ENCOUNTER — Other Ambulatory Visit (INDEPENDENT_AMBULATORY_CARE_PROVIDER_SITE_OTHER): Payer: BC Managed Care – PPO

## 2013-07-05 DIAGNOSIS — E119 Type 2 diabetes mellitus without complications: Secondary | ICD-10-CM

## 2013-07-05 LAB — HEMOGLOBIN A1C: Hgb A1c MFr Bld: 6.6 % — ABNORMAL HIGH (ref 4.6–6.5)

## 2013-07-05 LAB — TSH: TSH: 2.6 u[IU]/mL (ref 0.35–5.50)

## 2013-07-12 ENCOUNTER — Ambulatory Visit: Payer: BC Managed Care – PPO | Admitting: Family Medicine

## 2013-07-19 ENCOUNTER — Ambulatory Visit (INDEPENDENT_AMBULATORY_CARE_PROVIDER_SITE_OTHER): Payer: BC Managed Care – PPO | Admitting: Family Medicine

## 2013-07-19 ENCOUNTER — Encounter: Payer: Self-pay | Admitting: Family Medicine

## 2013-07-19 VITALS — BP 128/70 | HR 80 | Temp 98.0°F | Wt 180.0 lb

## 2013-07-19 DIAGNOSIS — Z23 Encounter for immunization: Secondary | ICD-10-CM

## 2013-07-19 DIAGNOSIS — F419 Anxiety disorder, unspecified: Secondary | ICD-10-CM

## 2013-07-19 DIAGNOSIS — E039 Hypothyroidism, unspecified: Secondary | ICD-10-CM

## 2013-07-19 DIAGNOSIS — E119 Type 2 diabetes mellitus without complications: Secondary | ICD-10-CM

## 2013-07-19 DIAGNOSIS — F411 Generalized anxiety disorder: Secondary | ICD-10-CM

## 2013-07-19 DIAGNOSIS — R109 Unspecified abdominal pain: Secondary | ICD-10-CM

## 2013-07-19 MED ORDER — METFORMIN HCL 500 MG PO TABS: 500.0000 mg | ORAL_TABLET | Freq: Every day | ORAL | Status: DC

## 2013-07-19 MED ORDER — METHOCARBAMOL 500 MG PO TABS: 500.0000 mg | ORAL_TABLET | Freq: Three times a day (TID) | ORAL | Status: DC | PRN

## 2013-07-19 NOTE — Patient Instructions (Addendum)
Schedule a physical for about 6 months from now, labs ahead of time.   Cut back to 1 metformin a day.   Try robaxin for the muscle tightness and headaches.   Take care.  Glad to see you.

## 2013-07-19 NOTE — Progress Notes (Signed)
Pre-visit discussion using our clinic review tool. No additional management support is needed unless otherwise documented below in the visit note.  

## 2013-07-19 NOTE — Progress Notes (Signed)
Diabetes:  Using medications without difficulties: some lows in spite of regular meals. Hypoglycemic episodes: yes Hyperglycemic episodes: no Feet problems: no Blood Sugars averaging: 130-150. A1c 6.6.  eye exam within last year: due, she'll call about that.  She is working on diet and exercise.  She lost some weight intentionally.    Hypothyroidism with TSH wnl on recent check.   HA, likely from muscle tightness.  Dry mouth with flexeril.  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  This has been stressful.    Hair loss w/o known cause.  We talked about OTC minoxidil.  No specific patches, just general thinning.    Some abd pain in LUQ, "like somebody is poking a hole in my stomach."  She has not talked to Dr. Juanda Chance about this. No NSAIDS.  She had tried taking pepcid and that helped.    She saw Dr. Vesta Mixer re: arthritis.   She saw uro recently re: incontinence.  She is doing Engineer, technical sales.  She has f/u pending.   PMH and SH reviewed  Meds, vitals, and allergies reviewed.   ROS: See HPI.  Otherwise negative.    GEN: nad, alert and oriented HEENT: mucous membranes moist NECK: supple w/o LA CV: rrr. PULM: ctab, no inc wob ABD: soft, +bs, mildly ttp in the epigastrum EXT: no edema SKIN: no acute rash

## 2013-07-22 NOTE — Assessment & Plan Note (Signed)
TSH wnl, d/w pt.

## 2013-07-22 NOTE — Assessment & Plan Note (Signed)
Likely GERD, improved with H2 blocker, continue as is and notify us if not better.

## 2013-07-22 NOTE — Assessment & Plan Note (Addendum)
Prev inc in SSFI has helped, continue as is with that.  Use robaxin for muscle spasms at that are likely stress related.

## 2013-07-22 NOTE — Assessment & Plan Note (Signed)
Continue as is on diet but cut metformin back to 500mg  a day given the prev lows.  Labs d/w pt.  She agrees.

## 2013-07-26 ENCOUNTER — Other Ambulatory Visit: Payer: Self-pay

## 2013-07-26 DIAGNOSIS — Z1231 Encounter for screening mammogram for malignant neoplasm of breast: Secondary | ICD-10-CM

## 2013-08-11 ENCOUNTER — Other Ambulatory Visit: Payer: Self-pay | Admitting: Family Medicine

## 2013-08-22 ENCOUNTER — Ambulatory Visit: Payer: BC Managed Care – PPO

## 2013-08-23 ENCOUNTER — Ambulatory Visit
Admission: RE | Admit: 2013-08-23 | Discharge: 2013-08-23 | Disposition: A | Discharge status: Home / Self Care | Payer: BC Managed Care – PPO | Source: Ambulatory Visit

## 2013-08-23 DIAGNOSIS — Z1231 Encounter for screening mammogram for malignant neoplasm of breast: Secondary | ICD-10-CM

## 2013-08-27 ENCOUNTER — Ambulatory Visit (INDEPENDENT_AMBULATORY_CARE_PROVIDER_SITE_OTHER): Payer: BC Managed Care – PPO

## 2013-08-27 DIAGNOSIS — Z23 Encounter for immunization: Secondary | ICD-10-CM

## 2013-08-29 ENCOUNTER — Encounter: Payer: Self-pay | Admitting: *Deleted

## 2013-09-12 HISTORY — PX: SHOULDER ARTHROSCOPY W/ ROTATOR CUFF REPAIR: SHX2400

## 2013-09-26 ENCOUNTER — Other Ambulatory Visit: Payer: Self-pay | Admitting: Family Medicine

## 2013-10-08 ENCOUNTER — Other Ambulatory Visit: Payer: Self-pay | Admitting: Family Medicine

## 2013-10-12 ENCOUNTER — Other Ambulatory Visit: Payer: Self-pay | Admitting: Family Medicine

## 2013-11-15 ENCOUNTER — Ambulatory Visit: Payer: BC Managed Care – PPO | Admitting: Family Medicine

## 2014-01-15 ENCOUNTER — Other Ambulatory Visit: Payer: Self-pay | Admitting: Internal Medicine

## 2014-01-16 ENCOUNTER — Ambulatory Visit: Payer: BC Managed Care – PPO

## 2014-02-07 ENCOUNTER — Other Ambulatory Visit: Payer: BC Managed Care – PPO

## 2014-02-12 ENCOUNTER — Encounter: Payer: BC Managed Care – PPO | Admitting: Family Medicine

## 2014-02-18 ENCOUNTER — Other Ambulatory Visit: Payer: Self-pay | Admitting: Family Medicine

## 2014-02-18 DIAGNOSIS — E119 Type 2 diabetes mellitus without complications: Secondary | ICD-10-CM

## 2014-02-21 ENCOUNTER — Other Ambulatory Visit (INDEPENDENT_AMBULATORY_CARE_PROVIDER_SITE_OTHER): Payer: BC Managed Care – PPO

## 2014-02-21 DIAGNOSIS — E785 Hyperlipidemia, unspecified: Secondary | ICD-10-CM

## 2014-02-21 DIAGNOSIS — E039 Hypothyroidism, unspecified: Secondary | ICD-10-CM

## 2014-02-21 DIAGNOSIS — E119 Type 2 diabetes mellitus without complications: Secondary | ICD-10-CM

## 2014-02-21 LAB — LIPID PANEL
Cholesterol: 255 mg/dL — ABNORMAL HIGH (ref 0–200)
HDL: 35.3 mg/dL — ABNORMAL LOW (ref >39.00)
LDL Cholesterol: 197 mg/dL — ABNORMAL HIGH (ref 0–99)
NonHDL: 219.7
Total CHOL/HDL Ratio: 7
Triglycerides: 112 mg/dL (ref 0.0–149.0)
VLDL: 22.4 mg/dL (ref 0.0–40.0)

## 2014-02-21 LAB — HEMOGLOBIN A1C: Hgb A1c MFr Bld: 6.4 % (ref 4.6–6.5)

## 2014-02-21 LAB — COMPREHENSIVE METABOLIC PANEL
ALT: 45 U/L — ABNORMAL HIGH (ref 0–35)
AST: 36 U/L (ref 0–37)
Albumin: 4 g/dL (ref 3.5–5.2)
Alkaline Phosphatase: 80 U/L (ref 39–117)
BUN: 12 mg/dL (ref 6–23)
CO2: 27 mEq/L (ref 19–32)
Calcium: 9.1 mg/dL (ref 8.4–10.5)
Chloride: 103 mEq/L (ref 96–112)
Creatinine, Ser: 0.9 mg/dL (ref 0.4–1.2)
GFR: 71.66 mL/min (ref >60.00)
Glucose, Bld: 133 mg/dL — ABNORMAL HIGH (ref 70–99)
Potassium: 4.5 mEq/L (ref 3.5–5.1)
Sodium: 137 mEq/L (ref 135–145)
Total Bilirubin: 0.5 mg/dL (ref 0.2–1.2)
Total Protein: 7 g/dL (ref 6.0–8.3)

## 2014-02-26 ENCOUNTER — Ambulatory Visit (INDEPENDENT_AMBULATORY_CARE_PROVIDER_SITE_OTHER): Payer: BC Managed Care – PPO | Admitting: Family Medicine

## 2014-02-26 ENCOUNTER — Encounter: Payer: Self-pay | Admitting: Family Medicine

## 2014-02-26 VITALS — BP 122/78 | HR 76 | Temp 98.5°F | Ht 67.0 in | Wt 178.5 lb

## 2014-02-26 DIAGNOSIS — F329 Major depressive disorder, single episode, unspecified: Secondary | ICD-10-CM

## 2014-02-26 DIAGNOSIS — I10 Essential (primary) hypertension: Secondary | ICD-10-CM

## 2014-02-26 DIAGNOSIS — Z0000 Encounter for general adult medical examination without abnormal findings: Secondary | ICD-10-CM

## 2014-02-26 DIAGNOSIS — R109 Unspecified abdominal pain: Secondary | ICD-10-CM

## 2014-02-26 DIAGNOSIS — E119 Type 2 diabetes mellitus without complications: Secondary | ICD-10-CM

## 2014-02-26 DIAGNOSIS — E785 Hyperlipidemia, unspecified: Secondary | ICD-10-CM

## 2014-02-26 DIAGNOSIS — Z23 Encounter for immunization: Secondary | ICD-10-CM

## 2014-02-26 DIAGNOSIS — E039 Hypothyroidism, unspecified: Secondary | ICD-10-CM

## 2014-02-26 HISTORY — DX: Encounter for general adult medical examination without abnormal findings: Z00.00

## 2014-02-26 LAB — TSH: TSH: 0.5 u[IU]/mL (ref 0.35–4.50)

## 2014-02-26 MED ORDER — ATENOLOL 50 MG PO TABS: ORAL_TABLET | ORAL | Status: DC

## 2014-02-26 MED ORDER — METFORMIN HCL 500 MG PO TABS: ORAL_TABLET | ORAL | Status: DC

## 2014-02-26 MED ORDER — LISINOPRIL 10 MG PO TABS: ORAL_TABLET | ORAL | Status: DC

## 2014-02-26 MED ORDER — FLUTICASONE PROPIONATE 50 MCG/ACT NA SUSP: 2.0000 | Freq: Every day | NASAL | Status: DC

## 2014-02-26 MED ORDER — CLONAZEPAM 0.5 MG PO TABS
0.2500 mg | ORAL_TABLET | Freq: Two times a day (BID) | ORAL | Status: DC | PRN
Start: 2014-02-26 — End: 2015-11-01

## 2014-02-26 MED ORDER — EZETIMIBE-SIMVASTATIN 10-40 MG PO TABS: ORAL_TABLET | ORAL | Status: DC

## 2014-02-26 MED ORDER — CITALOPRAM HYDROBROMIDE 20 MG PO TABS: 20.0000 mg | ORAL_TABLET | Freq: Every day | ORAL | Status: DC

## 2014-02-26 NOTE — Progress Notes (Signed)
Pre visit review using our clinic review tool, if applicable. No additional management support is needed unless otherwise documented below in the visit note. 

## 2014-02-26 NOTE — Patient Instructions (Addendum)
Go to the lab on the way out.  We'll contact you with your lab report. Try taking 150mg  of zantac twice a day.  If not better, then notify us or GI.  Take care.  Glad to see you.   Recheck A1c before a visit in about 6 months.

## 2014-02-26 NOTE — Assessment & Plan Note (Signed)
Controlled, continue as is.  No change in meds.  Recheck in about 6 months.

## 2014-02-26 NOTE — Assessment & Plan Note (Signed)
Continue current SSRI, rare use of BZD.  She has a difficult social situation and is trying to work through that.  Support offered.

## 2014-02-26 NOTE — Assessment & Plan Note (Signed)
Controlled, continue ACE.  Labs d/w pt.

## 2014-02-26 NOTE — Assessment & Plan Note (Signed)
Recheck TSH today, see notes on labs.

## 2014-02-26 NOTE — Assessment & Plan Note (Signed)
Elevated off vytorin, restart med.  She agrees.

## 2014-02-26 NOTE — Assessment & Plan Note (Signed)
Routine anticipatory guidance given to patient.  See health maintenance. No pap needed, s/p hysterectomy.  Tetanus 2013 Flu prev done Shingles prev done PNA done today.  Colonoscopy 2008 Mammogram 08/2014 DXA not due yet Living will d/w pt. Would have her sister Allison Yoder designated if she were incapacitated.

## 2014-02-26 NOTE — Assessment & Plan Note (Signed)
Likely would need BID H2 blocker, d/w pt.  If not improved, then notify us or GI.  She agrees.

## 2014-02-26 NOTE — Progress Notes (Signed)
CPE- See plan.  Routine anticipatory guidance given to patient.  See health maintenance. No pap needed, s/p hysterectomy.  Tetanus 2013 Flu prev done Shingles prev done PNA done today.  Colonoscopy 2008 Mammogram 08/2014 DXA not due yet Living will d/w pt. Would have her sister Jackelyn Poling designated if she were incapacitated.    H/o PNA back in early 2015.  Resolved now.   Diabetes:  Using medications without difficulties:yes Hypoglycemic episodes:no Hyperglycemic episodes:no Feet problems:occ tingling Blood Sugars averaging: ~110 eye exam within last year: yes, ~3 months ago A1c at goal, d/w pt.    Hypothyroidism. Off replacement, due for repeat TSH.    Hypertension:    Using medication without problems or lightheadedness: yes Chest pain with exertion:no Edema:occ ankle edema Short of breath: occ, can happen at rest, no clearly only exertional.    Elevated Cholesterol: Using medications without problems:off med Muscle aches: not changed off/on med Diet compliance: no, encouarged Exercise: only yardwork  MDD.  H/o sx with exhaustion, tearful some- better now.  No SI/HI.  Compliant with meds.  Noted social stressors with her son.  Contacts for safety.  She's trying to get out of the house and work in her flowers.   Likely GERD vs IBS sx with h/o improvement with bentyl.  She'll get some upper abd pain, after eating.  Burning in the stomach.  Burning in the chest, "along my esophagus."  Has tried H2 blocker occ with relief.  Worse with getting upset.  She has a dry cough that may be GERD related.    PMH and SH reviewed.   Vital signs, Meds and allergies reviewed.  ROS: See HPI.  Otherwise nontributory.   GEN: nad, alert and oriented HEENT: mucous membranes moist NECK: supple w/o LA, no tmg CV: rrr.  PULM: ctab, no inc wob ABD: soft, +bs EXT: no edema SKIN: no acute rash Breast exam: No mass, nodules, thickening, tenderness, bulging, retraction, inflamation, nipple  discharge or skin changes noted.  No axillary or clavicular LA.  Chaperoned exam.   Diabetic foot exam: Normal inspection No skin breakdown No calluses  Normal DP pulses Normal sensation to light tough and monofilament Nails normal

## 2014-02-27 ENCOUNTER — Telehealth: Payer: Self-pay | Admitting: Family Medicine

## 2014-02-27 NOTE — Telephone Encounter (Signed)
Relevant patient education assigned to patient using Emmi. ° °

## 2014-03-17 ENCOUNTER — Other Ambulatory Visit: Payer: BC Managed Care – PPO

## 2014-03-19 ENCOUNTER — Encounter: Payer: BC Managed Care – PPO | Admitting: Family Medicine

## 2014-03-19 ENCOUNTER — Telehealth: Payer: Self-pay

## 2014-03-19 NOTE — Telephone Encounter (Signed)
Pt called to schedule 3rd Hep B immunization; pt scheduled on 04/04/14 at 2 PM./

## 2014-03-26 ENCOUNTER — Telehealth: Payer: Self-pay

## 2014-03-26 DIAGNOSIS — G4730 Sleep apnea, unspecified: Secondary | ICD-10-CM

## 2014-03-26 NOTE — Telephone Encounter (Signed)
Pt left v/m; pt was seen for CPE on 02/26/14 and pt forgot to request sleep study to be scheduled. Pt's family tells pt she not only snores when she is asleep but also stops breathing periodically while sleeping. Pt request cb.

## 2014-03-27 NOTE — Telephone Encounter (Signed)
pulm referral ordered. Thanks.

## 2014-03-27 NOTE — Telephone Encounter (Signed)
Left detailed message on voicemail of cell phone. 

## 2014-04-04 ENCOUNTER — Ambulatory Visit (INDEPENDENT_AMBULATORY_CARE_PROVIDER_SITE_OTHER): Payer: BC Managed Care – PPO

## 2014-04-04 DIAGNOSIS — Z23 Encounter for immunization: Secondary | ICD-10-CM

## 2014-04-04 DIAGNOSIS — Z20811 Contact with and (suspected) exposure to meningococcus: Secondary | ICD-10-CM

## 2014-04-13 ENCOUNTER — Other Ambulatory Visit: Payer: Self-pay | Admitting: Family Medicine

## 2014-04-14 ENCOUNTER — Other Ambulatory Visit: Payer: Self-pay | Admitting: Family Medicine

## 2014-04-20 ENCOUNTER — Other Ambulatory Visit: Payer: Self-pay | Admitting: Family Medicine

## 2014-04-20 DIAGNOSIS — E785 Hyperlipidemia, unspecified: Secondary | ICD-10-CM

## 2014-04-22 ENCOUNTER — Other Ambulatory Visit: Payer: BC Managed Care – PPO

## 2014-05-16 ENCOUNTER — Institutional Professional Consult (permissible substitution): Payer: BC Managed Care – PPO | Admitting: Pulmonary Disease

## 2014-06-12 ENCOUNTER — Other Ambulatory Visit: Payer: Self-pay | Admitting: Family Medicine

## 2014-06-27 ENCOUNTER — Institutional Professional Consult (permissible substitution): Payer: BC Managed Care – PPO | Admitting: Pulmonary Disease

## 2014-07-25 ENCOUNTER — Encounter: Payer: Self-pay | Admitting: Pulmonary Disease

## 2014-07-25 ENCOUNTER — Ambulatory Visit (INDEPENDENT_AMBULATORY_CARE_PROVIDER_SITE_OTHER): Payer: BC Managed Care – PPO | Admitting: Pulmonary Disease

## 2014-07-25 ENCOUNTER — Ambulatory Visit: Payer: BC Managed Care – PPO | Admitting: *Deleted

## 2014-07-25 VITALS — BP 134/76 | HR 75 | Temp 97.8°F | Ht 67.0 in | Wt 172.6 lb

## 2014-07-25 DIAGNOSIS — G4733 Obstructive sleep apnea (adult) (pediatric): Secondary | ICD-10-CM

## 2014-07-25 DIAGNOSIS — Z23 Encounter for immunization: Secondary | ICD-10-CM

## 2014-07-25 NOTE — Patient Instructions (Signed)
Will schedule for home sleep testing, and will call once results are available.  

## 2014-07-25 NOTE — Progress Notes (Signed)
Subjective:    Patient ID: Allison Yoder, female    DOB: 26-Jul-1951, 63 y.o.   MRN: [id number]  HPI The patient is a 63 year old female who I've been asked to see for possible obstructive sleep apnea. She has been told that she has loud snoring, and also an abnormal breathing pattern during sleep. She has frequent awakenings at night, and is not rested in the mornings upon arising. She describes significant sleep pressure during the day with inactivity, and will also fall asleep in the evening easily with movies or television. She has no issues driving shorter distances, but does get sleep pressure with longer distances to the beach. She tells me that her weight is up 20 pounds over the last 2 years, and her Epworth score today is 17.   Sleep Questionnaire What time do you typically go to bed?( Between what hours) 8-10PM 8-10PM at 1527 on 07/25/14 by Inge Rise, CMA How long does it take you to fall asleep? 1-2 hrs 1-2 hrs at 1527 on 07/25/14 by Inge Rise, CMA How many times during the night do you wake up? 2 2 at 1527 on 07/25/14 by Inge Rise, [place] What time do you get out of bed to start your day? 0600 0600 at 1527 on 07/25/14 by Inge Rise, CMA Do you drive or operate heavy machinery in your occupation? No No at 1527 on 07/25/14 by Inge Rise, CMA How much has your weight changed (up or down) over the past two years? (In pounds) 20 lb (9.072 kg) 20 lb (9.072 kg) at 1527 on 07/25/14 by Inge Rise, CMA Have you ever had a sleep study before? No No at 1527 on 07/25/14 by Inge Rise, CMA Do you currently use CPAP? No No at 1527 on 07/25/14 by Inge Rise, CMA Do you wear oxygen at any time? No No at 1527 on 07/25/14 by Inge Rise, CMA   Review of Systems  Constitutional: Positive for appetite change and unexpected weight change. Negative for fever.  HENT: Positive for sore throat. Negative for congestion, dental problem, ear pain, nosebleeds,  postnasal drip, rhinorrhea, sinus pressure, sneezing and trouble swallowing.   Eyes: Negative for redness and itching.  Respiratory: Positive for cough and shortness of breath. Negative for chest tightness and wheezing.   Cardiovascular: Negative for palpitations and leg swelling.  Gastrointestinal: Positive for abdominal pain. Negative for nausea and vomiting.  Genitourinary: Negative for dysuria.  Musculoskeletal: Positive for arthralgias. Negative for joint swelling.  Skin: Negative for rash.  Neurological: Positive for headaches.  Hematological: Does not bruise/bleed easily.  Psychiatric/Behavioral: Negative for dysphoric mood. The patient is not nervous/anxious.        Objective:   Physical Exam Constitutional:  Overweight female, no acute distress  HENT:  Nares patent without discharge, septal deviation to the left with narrowing  Oropharynx without exudate, palate and uvula are mildly elongated.  Eyes:  Perrla, eomi, no scleral icterus  Neck:  No JVD, no TMG  Cardiovascular:  Normal rate, regular rhythm, no rubs or gallops.  No murmurs        Intact distal pulses  Pulmonary :  Normal breath sounds, no stridor or respiratory distress   No rales, rhonchi, or wheezing  Abdominal:  Soft, nondistended, bowel sounds present.  No tenderness noted.   Musculoskeletal:  No lower extremity edema noted.  Lymph Nodes:  No cervical lymphadenopathy noted  Skin:  No cyanosis noted  Neurologic:  Alert, appropriate, moves all 4 extremities without obvious deficit.         Assessment & Plan:

## 2014-07-25 NOTE — Assessment & Plan Note (Signed)
The patient's history is very suspicious for clinically significant sleep disordered breathing. I have had a long discussion with her about the pathophysiology of sleep apnea, including its impact to her quality of life and cardiovascular health. She will need a sleep study for diagnosis, and I think she is an excellent candidate for home sleep testing. Patient is agreeable to this approach.

## 2014-07-30 ENCOUNTER — Telehealth: Payer: Self-pay | Admitting: Pulmonary Disease

## 2014-07-30 NOTE — Telephone Encounter (Signed)
Spoke with patient-she would like to know the cost out of pocket for a home sleep study as she does not want a sleep study in lab done as she has a $5000 deductible. Will forward to Chantel and myself to check the cost an let patient know asap.

## 2014-07-30 NOTE — Telephone Encounter (Signed)
Let her know that I am sorry about that.  They are the only company that often denies these. Because her history is very suspicious for sleep apnea, and if she feels she is having symptoms that are impacting her quality of life, would proceed with sleep center study if that is what they will cover.

## 2014-07-30 NOTE — Telephone Encounter (Signed)
Please advise KC thanks 

## 2014-08-01 NOTE — Telephone Encounter (Signed)
There are 2 charges 550 for the teaching and 175 for the read so total 701 for Allison Yoder done in the office

## 2014-08-01 NOTE — Telephone Encounter (Signed)
LMTCB x1 for pt.  

## 2014-08-04 NOTE — Telephone Encounter (Signed)
lmomtcb x 2  

## 2014-08-05 NOTE — Telephone Encounter (Signed)
lmomtcb for pt 

## 2014-08-06 NOTE — Telephone Encounter (Signed)
lmtcb for patient.  Per triage protocol will close this message.

## 2014-09-12 HISTORY — PX: KNEE ARTHROSCOPY: SHX127

## 2014-10-15 ENCOUNTER — Ambulatory Visit (HOSPITAL_BASED_OUTPATIENT_CLINIC_OR_DEPARTMENT_OTHER): Payer: BC Managed Care – PPO | Attending: Otolaryngology | Admitting: *Deleted

## 2014-10-15 VITALS — Ht 67.0 in | Wt 154.0 lb

## 2014-10-15 DIAGNOSIS — G4730 Sleep apnea, unspecified: Secondary | ICD-10-CM | POA: Diagnosis not present

## 2014-10-15 DIAGNOSIS — G4719 Other hypersomnia: Secondary | ICD-10-CM | POA: Insufficient documentation

## 2014-10-15 DIAGNOSIS — R0683 Snoring: Secondary | ICD-10-CM | POA: Diagnosis not present

## 2014-10-25 DIAGNOSIS — G4730 Sleep apnea, unspecified: Secondary | ICD-10-CM

## 2014-10-25 DIAGNOSIS — G4719 Other hypersomnia: Secondary | ICD-10-CM

## 2014-10-25 DIAGNOSIS — R0683 Snoring: Secondary | ICD-10-CM

## 2014-10-25 NOTE — Sleep Study (Signed)
   NAME: Allison Yoder DATE OF BIRTH:  [birth year] MEDICAL RECORD NUMBER [id number]  LOCATION: [facility]  PHYSICIAN: YOUNG,CLINTON D  DATE OF STUDY: 10/15/2014  SLEEP STUDY TYPE: Nocturnal Polysomnogram               REFERRING PHYSICIAN: Jodi Marble, MD  INDICATION FOR STUDY: Hypersomnia with sleep apnea  EPWORTH SLEEPINESS SCORE:   21/24 HEIGHT: 5\' 7"  (170.2 cm)  WEIGHT: 154 lb (69.854 kg)    Body mass index is 24.11 kg/(m^2).  NECK SIZE: 15 in.  MEDICATIONS: Charted for review  SLEEP ARCHITECTURE: Split study protocol. During the diagnostic phase, total sleep time 163 minutes with sleep efficiency 90.8%. Stage I was 8.3%, stage II 84.4%, stage III 2.5%, REM 4.9% of total sleep time. Sleep latency 10 minutes, REM latency 82.5 minutes, awake after sleep onset 8 minutes, arousal index 11, bedtime medication: None  RESPIRATORY DATA: Apnea hypopnea index (AHI) 16.6 per hour. 45 total events scored including 2 obstructive apneas and 43 hypopneas . REM AHI 30.0 per hour. CPAP titration to 12 CWP, AHI 0 per hour. She wore a small fullface mask.  OXYGEN DATA: Moderately loud snoring before CPAP with oxygen desaturation to a nadir of 80% on room air. With CPAP control, snoring was prevented and mean oxygen saturation was 94.8%.  CARDIAC DATA: Sinus rhythm  MOVEMENT/PARASOMNIA: No significant movement disturbance, bathroom 1  IMPRESSION/ RECOMMENDATION:   1) Moderate obstructive sleep apnea/hypopnea syndrome, AHI 16.6 per hour with non-positional events. REM AHI 30 per hour. Moderately loud snoring with oxygen desaturation to a nadir of 80% on room air. 2) Successful CPAP titration to 12 CWP, AHI 0 per hour. She wore a small F&P Simplus fullface mask with heated humidifier. Snoring was prevented and mean oxygen saturation was 94.8%.   Deneise Lever Diplomate, American Board of Sleep Medicine  ELECTRONICALLY SIGNED ON:  10/25/2014, 12:14 PM [place] PH: [phone]   FX: [phone] [place]

## 2014-11-19 ENCOUNTER — Telehealth: Payer: Self-pay

## 2014-11-19 DIAGNOSIS — Z9229 Personal history of other drug therapy: Secondary | ICD-10-CM

## 2014-11-19 NOTE — Telephone Encounter (Addendum)
Ordered. Thanks. Needs lab appointment set up.

## 2014-11-19 NOTE — Telephone Encounter (Signed)
Pt left v/m requesting order to ck Hep titer since pt has finished hepatitis immunizations. Pt request cb.

## 2014-11-20 NOTE — Telephone Encounter (Signed)
Patient advised.  Lab appt scheduled.  

## 2014-11-25 ENCOUNTER — Other Ambulatory Visit: Payer: BC Managed Care – PPO

## 2014-12-10 ENCOUNTER — Encounter (HOSPITAL_BASED_OUTPATIENT_CLINIC_OR_DEPARTMENT_OTHER): Payer: Self-pay

## 2015-01-29 ENCOUNTER — Other Ambulatory Visit: Payer: Self-pay | Admitting: Family Medicine

## 2015-01-29 NOTE — Telephone Encounter (Signed)
Electronic refill request.  Not on patient's current meds list.  Please advise.

## 2015-01-29 NOTE — Telephone Encounter (Signed)
Please clarify with patient.  Okay to send as is if needed.  Thanks.

## 2015-02-03 ENCOUNTER — Other Ambulatory Visit: Payer: Self-pay | Admitting: Family Medicine

## 2015-02-16 ENCOUNTER — Other Ambulatory Visit: Payer: Self-pay | Admitting: Family Medicine

## 2015-02-16 DIAGNOSIS — E119 Type 2 diabetes mellitus without complications: Secondary | ICD-10-CM

## 2015-02-23 ENCOUNTER — Other Ambulatory Visit: Payer: BLUE CROSS/BLUE SHIELD

## 2015-02-25 ENCOUNTER — Telehealth: Payer: Self-pay | Admitting: Family Medicine

## 2015-02-25 NOTE — Telephone Encounter (Addendum)
See if you can get a 18min appointment that works for her, just not on a Monday or Friday.  Hope she feels better soon.  Thanks.

## 2015-02-25 NOTE — Telephone Encounter (Signed)
Patient had shoulder surgery last Thursday.  She was scheduled for a cpx on 02/26/15.  She cancelled that appointment because she's not feeling well enough to come.  Dr.Duncan's first available for a cpx is 04/20/15.  Patient will be off of work next week and wants to know if she can have her cpx done next week, because she waited 3 months for this appointment.  Please advise.

## 2015-02-26 ENCOUNTER — Encounter: Payer: BC Managed Care – PPO | Admitting: Family Medicine

## 2015-03-04 ENCOUNTER — Encounter: Payer: BLUE CROSS/BLUE SHIELD | Admitting: Family Medicine

## 2015-03-05 ENCOUNTER — Other Ambulatory Visit: Payer: Self-pay | Admitting: Family Medicine

## 2015-04-14 ENCOUNTER — Other Ambulatory Visit: Payer: Self-pay | Admitting: Family Medicine

## 2015-04-15 ENCOUNTER — Other Ambulatory Visit (INDEPENDENT_AMBULATORY_CARE_PROVIDER_SITE_OTHER): Payer: BLUE CROSS/BLUE SHIELD

## 2015-04-15 ENCOUNTER — Other Ambulatory Visit: Payer: Self-pay | Admitting: Family Medicine

## 2015-04-15 DIAGNOSIS — E119 Type 2 diabetes mellitus without complications: Secondary | ICD-10-CM

## 2015-04-15 DIAGNOSIS — N951 Menopausal and female climacteric states: Secondary | ICD-10-CM

## 2015-04-15 DIAGNOSIS — Z9229 Personal history of other drug therapy: Secondary | ICD-10-CM

## 2015-04-15 LAB — COMPREHENSIVE METABOLIC PANEL
ALT: 15 U/L (ref 0–35)
AST: 20 U/L (ref 0–37)
Albumin: 4.2 g/dL (ref 3.5–5.2)
Alkaline Phosphatase: 82 U/L (ref 39–117)
BUN: 17 mg/dL (ref 6–23)
CO2: 27 mEq/L (ref 19–32)
Calcium: 9.4 mg/dL (ref 8.4–10.5)
Chloride: 104 mEq/L (ref 96–112)
Creatinine, Ser: 0.77 mg/dL (ref 0.40–1.20)
GFR: 80.03 mL/min (ref >60.00)
Glucose, Bld: 114 mg/dL — ABNORMAL HIGH (ref 70–99)
Potassium: 4.3 mEq/L (ref 3.5–5.1)
Sodium: 138 mEq/L (ref 135–145)
Total Bilirubin: 0.5 mg/dL (ref 0.2–1.2)
Total Protein: 6.9 g/dL (ref 6.0–8.3)

## 2015-04-15 LAB — LIPID PANEL
Cholesterol: 208 mg/dL — ABNORMAL HIGH (ref 0–200)
HDL: 41.8 mg/dL (ref >39.00)
LDL Cholesterol: 144 mg/dL — ABNORMAL HIGH (ref 0–99)
NonHDL: 166.01
Total CHOL/HDL Ratio: 5
Triglycerides: 112 mg/dL (ref 0.0–149.0)
VLDL: 22.4 mg/dL (ref 0.0–40.0)

## 2015-04-15 LAB — HM COLONOSCOPY

## 2015-04-15 LAB — HEMOGLOBIN A1C: Hgb A1c MFr Bld: 5.8 % (ref 4.6–6.5)

## 2015-04-15 LAB — TSH: TSH: 3.03 u[IU]/mL (ref 0.35–4.50)

## 2015-04-15 LAB — FOLLICLE STIMULATING HORMONE: FSH: 22.1 m[IU]/mL

## 2015-04-15 NOTE — Addendum Note (Signed)
Addended by: Daralene Milch C on: 04/15/2015 11:54 AM   Modules accepted: Orders

## 2015-04-16 LAB — HEPATITIS B SURFACE ANTIBODY,QUALITATIVE

## 2015-04-19 ENCOUNTER — Telehealth: Payer: Self-pay | Admitting: Family Medicine

## 2015-04-19 NOTE — Telephone Encounter (Addendum)
Called and LMOVM for patient about cancelling scheduled Monday 04/20/15 MD visit.  Please reschedule her but not for Tuesday.  Thanks.

## 2015-04-20 ENCOUNTER — Encounter: Payer: BLUE CROSS/BLUE SHIELD | Admitting: Family Medicine

## 2015-04-28 ENCOUNTER — Ambulatory Visit (INDEPENDENT_AMBULATORY_CARE_PROVIDER_SITE_OTHER): Payer: BLUE CROSS/BLUE SHIELD | Admitting: Family Medicine

## 2015-04-28 ENCOUNTER — Other Ambulatory Visit: Payer: Self-pay | Admitting: Family Medicine

## 2015-04-28 ENCOUNTER — Encounter: Payer: Self-pay | Admitting: Family Medicine

## 2015-04-28 VITALS — BP 118/70 | HR 74 | Temp 98.7°F | Ht 67.0 in | Wt 166.5 lb

## 2015-04-28 DIAGNOSIS — E119 Type 2 diabetes mellitus without complications: Secondary | ICD-10-CM

## 2015-04-28 DIAGNOSIS — R739 Hyperglycemia, unspecified: Secondary | ICD-10-CM

## 2015-04-28 DIAGNOSIS — F331 Major depressive disorder, recurrent, moderate: Secondary | ICD-10-CM

## 2015-04-28 DIAGNOSIS — E039 Hypothyroidism, unspecified: Secondary | ICD-10-CM

## 2015-04-28 DIAGNOSIS — I10 Essential (primary) hypertension: Secondary | ICD-10-CM

## 2015-04-28 DIAGNOSIS — Z0000 Encounter for general adult medical examination without abnormal findings: Secondary | ICD-10-CM | POA: Diagnosis not present

## 2015-04-28 DIAGNOSIS — Z119 Encounter for screening for infectious and parasitic diseases, unspecified: Secondary | ICD-10-CM

## 2015-04-28 DIAGNOSIS — Z7189 Other specified counseling: Secondary | ICD-10-CM

## 2015-04-28 MED ORDER — CITALOPRAM HYDROBROMIDE 20 MG PO TABS: ORAL_TABLET | ORAL | Status: DC

## 2015-04-28 MED ORDER — LISINOPRIL 10 MG PO TABS: 10.0000 mg | ORAL_TABLET | Freq: Every day | ORAL | Status: DC

## 2015-04-28 MED ORDER — ATENOLOL 50 MG PO TABS: 50.0000 mg | ORAL_TABLET | Freq: Every day | ORAL | Status: DC

## 2015-04-28 NOTE — Patient Instructions (Signed)
Stop metformin.  Recheck A1c in about 3 months.  Go to the lab on the way out.  We'll contact you with your lab report. I would restart 40mg  of citalopram a day.  That should help.   Take care.  Glad to see you.

## 2015-04-28 NOTE — Progress Notes (Signed)
Pre visit review using our clinic review tool, if applicable. No additional management support is needed unless otherwise documented below in the visit note.  CPE- See plan.  Routine anticipatory guidance given to patient.  See health maintenance. No pap needed, s/p hysterectomy.  Tetanus 2013 Flu to be done at work.   Shingles prev done PNA done prev Colonoscopy 2016 Mammogram pending.   DXA not due yet Living will d/w pt. Would have her sister Allison Yoder designated if she were incapacitated.  Diet and exercise.  Walking for exercise.  Intentional weight loss noted.    Diabetes:  Using medications without difficulties:yes Hypoglycemic episodes: occ sx.  If prolonged fasting.  Will stop metformin, d/w pt.   Hyperglycemic episodes:no Feet problems:no Blood Sugars averaging: not checked, her meter was broken.  rx given to patient for meter.  eye exam within last year: done about 3 months ago.   A1c low, d/w pt.  Hypothyroidism. TSH wnl.  Off replacement.  No dysphagia, no neck mass.   Hypertension:  Using medication without problems or lightheadedness: yes Chest pain with exertion:no Edema:occ ankle edema  Short of breath: no  MDD. No SI/HI. Compliant with meds. Noted social stressors with her son. Contacts for safety. 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  She dropped down to 20mg  of celexa and didn't tolerate that as well.  D/w pt about inc back to 40mg  a day.   PMH and SH reviewed  Meds, vitals, and allergies reviewed.   ROS: See HPI.  Otherwise negative.    GEN: nad, alert and oriented HEENT: mucous membranes moist NECK: supple w/o LA CV: rrr. PULM: ctab, no inc wob ABD: soft, +bs EXT: no edema SKIN: no acute rash

## 2015-04-29 DIAGNOSIS — Z7189 Other specified counseling: Secondary | ICD-10-CM | POA: Insufficient documentation

## 2015-04-29 LAB — HIV ANTIBODY (ROUTINE TESTING W REFLEX): HIV 1&2 Ab, 4th Generation: NONREACTIVE

## 2015-04-29 LAB — HEPATITIS C ANTIBODY: HCV Ab: NEGATIVE

## 2015-04-29 NOTE — Assessment & Plan Note (Signed)
Compliant with meds. Noted social stressors with her son. Contacts for safety. 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 She dropped down to 20mg  of celexa and didn't tolerate that as well. D/w pt about inc back to 40mg  a day.  She agrees with inc back to 40mg  a day.

## 2015-04-29 NOTE — Assessment & Plan Note (Signed)
Routine anticipatory guidance given to patient.  See health maintenance. No pap needed, s/p hysterectomy.  Tetanus 2013 Flu to be done at work.   Shingles prev done PNA done prev Colonoscopy 2016 Mammogram pending.   DXA not due yet Living will d/w pt. Would have her sister Allison Yoder designated if she were incapacitated.  Diet and exercise.  Walking for exercise.  Intentional weight loss noted.

## 2015-04-29 NOTE — Assessment & Plan Note (Signed)
Given sx of low sugar and low A1c, stop metformin. Likely with hyperglycemia vs normal sugar dx, likely not DM2.  Continue work on weight loss.  Recheck A1c in about 3 months.  Meter given to patient to check sugar as needed in meantime.

## 2015-04-29 NOTE — Assessment & Plan Note (Signed)
With normal TSH off replacement, continue as is.  Can recheck periodically.

## 2015-04-29 NOTE — Assessment & Plan Note (Signed)
Controlled, continue as is. She agrees.

## 2015-05-07 ENCOUNTER — Encounter: Payer: Self-pay | Admitting: Family Medicine

## 2015-08-27 ENCOUNTER — Telehealth: Payer: Self-pay | Admitting: Family Medicine

## 2015-08-27 ENCOUNTER — Ambulatory Visit (INDEPENDENT_AMBULATORY_CARE_PROVIDER_SITE_OTHER): Payer: Medicare Other | Admitting: Primary Care

## 2015-08-27 ENCOUNTER — Encounter: Payer: Self-pay | Admitting: Primary Care

## 2015-08-27 VITALS — BP 124/84 | HR 82 | Temp 97.2°F | Ht 67.0 in | Wt 178.8 lb

## 2015-08-27 DIAGNOSIS — J069 Acute upper respiratory infection, unspecified: Secondary | ICD-10-CM | POA: Diagnosis not present

## 2015-08-27 NOTE — Progress Notes (Signed)
Subjective:    Patient ID: Allison Yoder, female    DOB: 1950/10/01, 64 y.o.   MRN: [id number]  HPI  Allison Yoder is a 64 year old female who presents today with a chief complaint of cough. She also reports wheezing, shortness of breath, nasal congestion, sore throat. Her cough is productive with green sputum. Her symptoms began Sunday this week, and have progressed. She's taken tylenol PM, alka-selzer cold, and tessalon pearls with slight improvement. Denies fevers. Overall she's noticed improvement in her shortness of breath.  Review of Systems  Constitutional: Positive for fatigue. Negative for fever and chills.  HENT: Positive for congestion, sinus pressure and sore throat. Negative for ear pain.   Respiratory: Positive for cough. Negative for shortness of breath and wheezing.   Cardiovascular: Negative for chest pain.  Gastrointestinal: Negative for nausea.  Musculoskeletal: Positive for myalgias.       Past Medical History  Diagnosis Date  . Anxiety and depression   . Diabetes mellitus   . GERD (gastroesophageal reflux disease)   . Allergy   . Hypertension   . Hyperlipidemia   . Hypothyroidism   . History of chicken pox   . Migraines   . Urinary incontinence   . UTI (lower urinary tract infection)     recurrent  . Arthritis     L shoulder  . Iritis     per [facility]  . Diverticulosis   . Fatty liver   . Anemia   . Fibromyalgia   . Heart murmur     states dx 20 yr ago-very mild-n0 echo  . Snores   . Wears glasses     Social History   Social History  . Marital Status: Divorced    Spouse Name: N/A  . Number of Children: 1  . Years of Education: N/A   Occupational History  . 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  .     Social History Main Topics  . Smoking status: Former Smoker -- 2.00 packs/day for 5 years    Types: Cigarettes    Quit date: 09/12/1985  . Smokeless tobacco: Never Used  . Alcohol Use: 0.0 oz/week    0  Standard drinks or equivalent per week     Comment: social  . Drug Use: No  . Sexual Activity: Not on file   Other Topics Concern  . Not on file   Social History Narrative   Education:  Futures trader   Divorced and lives with her brother.      Past Surgical History  Procedure Laterality Date  . Abdominal hysterectomy  1992    Partial  . Bladder/pelvic tack  2001  . Breast biopsy  1964    benign tumor, right  . Tubal ligation    . Bladder polpectomy      x 3  . Rotator cuff repair Left   . Rotator cuff repair Right     Family History  Problem Relation Age of Onset  . Arthritis Maternal Grandmother   . Stroke Maternal Grandmother   . Diabetes Maternal Grandmother   . Heart disease Paternal Uncle     x 2  . Arthritis Sister   . Breast cancer Cousin   . Heart disease Paternal Aunt   . Diabetes Paternal Uncle   . Arthritis Mother   . Heart disease Mother   . Hyperlipidemia Mother   . Hypertension Mother   . Diabetes Mother   .  Depression Mother   . Alcohol abuse Father   . Lung cancer Father   . Diabetes Sister   . Heart disease Paternal Uncle     x 5  . Heart disease Paternal Aunt     x 3  . Stroke Paternal Aunt   . Colon cancer Neg Hx     Allergies  Allergen Reactions  . Cephalexin Itching  . Codeine Itching  . Inapsine [Droperidol] Shortness Of Breath, Anxiety and Hypertension    Elevated HR and BP, panic attack  . Lipitor [Atorvastatin Calcium]     Myalgias   . Niacin And Related     Flushing  . Pravastatin     Myalgias  . Adhesive [Tape] Rash    Blisters with tape    Current Outpatient Prescriptions on File Prior to Visit  Medication Sig Dispense Refill  . atenolol (TENORMIN) 50 MG tablet Take 1 tablet (50 mg total) by mouth daily. 90 tablet 3  . Calcium-Vitamin D 600-200 MG-UNIT per tablet Take 1 tablet by mouth 2 (two) times daily.    . cetirizine (ZYRTEC) 10 MG tablet Take 10 mg by mouth as needed.    . citalopram (CELEXA) 20 MG  tablet TAKE ONE OR TWO TABLETS PER DAY 180 tablet 3  . clonazePAM (KLONOPIN) 0.5 MG tablet Take 0.5-1 tablets (0.25-0.5 mg total) by mouth 2 (two) times daily as needed for anxiety. 30 tablet 1  . fluticasone (FLONASE) 50 MCG/ACT nasal spray INHALE 2 SPRAYS INTO EACH NOSTRIL EVERY DAY. 16 g 0  . hydrocortisone (ANUSOL-HC) 25 MG suppository INSERT 1 SUPPOSITORY RECTALLY TWICE A DAY 24 suppository 0  . lisinopril (PRINIVIL,ZESTRIL) 10 MG tablet Take 1 tablet (10 mg total) by mouth at bedtime. 90 tablet 3  . methocarbamol (ROBAXIN) 500 MG tablet Take 1 tablet (500 mg total) by mouth every 8 (eight) hours as needed for muscle spasms. 30 tablet 1  . Multiple Vitamin (MULTIVITAMIN) tablet Take 1 tablet by mouth daily.    . Multiple Vitamins-Minerals (HAIR/SKIN/NAILS PO) Take by mouth.    . nitrofurantoin (MACRODANTIN) 100 MG capsule Take 100 mg by mouth daily.    . phenazopyridine (PYRIDIUM) 95 MG tablet Take 95 mg by mouth as needed for pain.    . ranitidine (ZANTAC) 150 MG tablet Take 150 mg by mouth 2 (two) times daily.    . vitamin B-12 (CYANOCOBALAMIN) 1000 MCG tablet Take 1,000 mcg by mouth daily.     No current facility-administered medications on file prior to visit.    BP 124/84 mmHg  Pulse 82  Temp(Src) 97.2 F (36.2 C) (Oral)  Ht 5\' 7"  (1.702 m)  Wt 178 lb 12.8 oz (81.103 kg)  BMI 28.00 kg/m2  SpO2 97%    Objective:   Physical Exam  Constitutional: She appears well-nourished.  HENT:  Right Ear: Tympanic membrane and ear canal normal.  Left Ear: Tympanic membrane and ear canal normal.  Nose: Right sinus exhibits maxillary sinus tenderness. Right sinus exhibits no frontal sinus tenderness. Left sinus exhibits maxillary sinus tenderness. Left sinus exhibits no frontal sinus tenderness.  Mouth/Throat: Oropharynx is clear and moist.  Eyes: Conjunctivae are normal. Pupils are equal, round, and reactive to light.  Neck: Neck supple.  Cardiovascular: Normal rate and regular rhythm.    Pulmonary/Chest: Effort normal and breath sounds normal. She has no wheezes. She has no rales.  Lymphadenopathy:    She has no cervical adenopathy.  Skin: Skin is warm and dry.  Assessment & Plan:  URI:  Cough, sore throat, nasal congestion, SOB x 4 days. Overall feeling slightly improved with OTC and old RX of tessalon pearls. Exam unremarkable. No wheezing, decreased sounds, or rhonchi to lung fields. Suspect viral URI and will treat with supportive measures. Continue tessalon pearls. Start flonase, increase fluids. She is to call if no improvement Monday next week.

## 2015-08-27 NOTE — Telephone Encounter (Signed)
Pt has appt 08/27/15 at 2 pm with Allie Bossier NP.

## 2015-08-27 NOTE — Patient Instructions (Signed)
Your symptoms are related to a virus which typically clears on its own.  Cough: You may take Benzonatate capsules for cough. Take 1 capsule by mouth three times daily as needed for cough.  Nasal Congestion: Flonase nasal spray. Instill 2 sprays in each nostril once daily.  Increase fluids and rest.  Call me Monday if no improvement.  It was a pleasure meeting you!  Upper Respiratory Infection, Adult Most upper respiratory infections (URIs) are a viral infection of the air passages leading to the lungs. A URI affects the nose, throat, and upper air passages. The most common type of URI is nasopharyngitis and is typically referred to as "the common cold." URIs run their course and usually go away on their own. Most of the time, a URI does not require medical attention, but sometimes a bacterial infection in the upper airways can follow a viral infection. This is called a secondary infection. Sinus and middle ear infections are common types of secondary upper respiratory infections. Bacterial pneumonia can also complicate a URI. A URI can worsen asthma and chronic obstructive pulmonary disease (COPD). Sometimes, these complications can require emergency medical care and may be life threatening.  CAUSES Almost all URIs are caused by viruses. A virus is a type of germ and can spread from one person to another.  RISKS FACTORS You may be at risk for a URI if:   You smoke.   You have chronic heart or lung disease.  You have a weakened defense (immune) system.   You are very young or very old.   You have nasal allergies or asthma.  You work in crowded or poorly ventilated areas.  You work in health care facilities or schools. SIGNS AND SYMPTOMS  Symptoms typically develop 2-3 days after you come in contact with a cold virus. Most viral URIs last 7-10 days. However, viral URIs from the influenza virus (flu virus) can last 14-18 days and are typically more severe. Symptoms may include:    Runny or stuffy (congested) nose.   Sneezing.   Cough.   Sore throat.   Headache.   Fatigue.   Fever.   Loss of appetite.   Pain in your forehead, behind your eyes, and over your cheekbones (sinus pain).  Muscle aches.  DIAGNOSIS  Your health care provider may diagnose a URI by:  Physical exam.  Tests to check that your symptoms are not due to another condition such as:  Strep throat.  Sinusitis.  Pneumonia.  Asthma. TREATMENT  A URI goes away on its own with time. It cannot be cured with medicines, but medicines may be prescribed or recommended to relieve symptoms. Medicines may help:  Reduce your fever.  Reduce your cough.  Relieve nasal congestion. HOME CARE INSTRUCTIONS   Take medicines only as directed by your health care provider.   Gargle warm saltwater or take cough drops to comfort your throat as directed by your health care provider.  Use a warm mist humidifier or inhale steam from a shower to increase air moisture. This may make it easier to breathe.  Drink enough fluid to keep your urine clear or pale yellow.   Eat soups and other clear broths and maintain good nutrition.   Rest as needed.   Return to work when your temperature has returned to normal or as your health care provider advises. You may need to stay home longer to avoid infecting others. You can also use a face mask and careful hand washing to prevent  spread of the virus.  Increase the usage of your inhaler if you have asthma.   Do not use any tobacco products, including cigarettes, chewing tobacco, or electronic cigarettes. If you need help quitting, ask your health care provider. PREVENTION  The best way to protect yourself from getting a cold is to practice good hygiene.   Avoid oral or hand contact with people with cold symptoms.   Wash your hands often if contact occurs.  There is no clear evidence that vitamin C, vitamin E, echinacea, or exercise  reduces the chance of developing a cold. However, it is always recommended to get plenty of rest, exercise, and practice good nutrition.  SEEK MEDICAL CARE IF:   You are getting worse rather than better.   Your symptoms are not controlled by medicine.   You have chills.  You have worsening shortness of breath.  You have brown or red mucus.  You have yellow or brown nasal discharge.  You have pain in your face, especially when you bend forward.  You have a fever.  You have swollen neck glands.  You have pain while swallowing.  You have white areas in the back of your throat. SEEK IMMEDIATE MEDICAL CARE IF:   You have severe or persistent:  Headache.  Ear pain.  Sinus pain.  Chest pain.  You have chronic lung disease and any of the following:  Wheezing.  Prolonged cough.  Coughing up blood.  A change in your usual mucus.  You have a stiff neck.  You have changes in your:  Vision.  Hearing.  Thinking.  Mood. MAKE SURE YOU:   Understand these instructions.  Will watch your condition.  Will get help right away if you are not doing well or get worse.   This information is not intended to replace advice given to you by your health care provider. Make sure you discuss any questions you have with your health care provider.   Document Released: 02/22/2001 Document Revised: 01/13/2015 Document Reviewed: 12/04/2013 Elsevier Interactive Patient Education [employer].

## 2015-08-27 NOTE — Telephone Encounter (Signed)
PLEASE NOTE: All timestamps contained within this report are represented as [place] Standard Time. CONFIDENTIALTY NOTICE: This fax transmission is intended only for the addressee. It contains information that is legally privileged, confidential or otherwise protected from use or disclosure. If you are not the intended recipient, you are strictly prohibited from reviewing, disclosing, copying using or disseminating any of this information or taking any action in reliance on or regarding this information. If you have received this fax in error, please notify us immediately by telephone so that we can arrange for its return to [place]. Phone: [phone], Toll-Free: [phone], Fax: [phone] Page: 1 of 2 Call Id: [id number] [place] Patient Name: Allison Yoder Gender: Female DOB: [birth year] Age: 64 Y 1 M 7 D Return Phone Number: [phone] (Primary) Address: City/State/Zip: [place] Client [facility] - Client Client Site [place] - Day Physician Renford Dills Contact Type Call Call Type Triage / [place] Name Shahad Relationship To Patient Self Appointment Disposition EMR Appointment Scheduled Info pasted into Epic Yes Return Phone Number [phone] (Primary) Chief Complaint BREATHING - fast, heavy or wheezing Initial Comment Caller she started to feel awful on Monday. Coughing up nasty stuff, body aches all over. Very tired. Tightness in chest from coughing. Little wheezing PreDisposition Call Doctor Nurse Assessment Nurse: Ronnald Ramp, RN, Miranda Date/Time Eilene Ghazi Time): 08/27/2015 8:54:25 AM Confirm and document reason for call. If symptomatic, describe symptoms. ---Caller states she has been feeling bad since Monday. She has a productive cough, tightness in her chest, and wheezing (described as rattling). Denies fever. She has body  aches and fatigue. Has the patient traveled out of the country within the last 30 days? ---No Does the patient have any new or worsening symptoms? ---Yes Will a triage be completed? ---Yes Related visit to physician within the last 2 weeks? ---No Does the PT have any chronic conditions? (i.e. diabetes, asthma, etc.) ---Yes List chronic conditions. ---HTN, Depression, Is this a behavioral health or substance abuse call? ---No Guidelines Guideline Title Affirmed Question Affirmed Notes Nurse Date/Time Eilene Ghazi Time) Cough - Acute Productive Wheezing is present Ronnald Ramp, RN, Miranda 08/27/2015 8:57:24 AM Disp. Time Eilene Ghazi Time) Disposition Final User 08/27/2015 8:51:13 AM Send to Urgent Antony Haste 08/27/2015 8:59:57 AM See Physician within 4 Hours (or PCP triage) Yes Ronnald Ramp, RN, Miranda PLEASE NOTE: All timestamps contained within this report are represented as [place] Standard Time. CONFIDENTIALTY NOTICE: This fax transmission is intended only for the addressee. It contains information that is legally privileged, confidential or otherwise protected from use or disclosure. If you are not the intended recipient, you are strictly prohibited from reviewing, disclosing, copying using or disseminating any of this information or taking any action in reliance on or regarding this information. If you have received this fax in error, please notify us immediately by telephone so that we can arrange for its return to [place]. Phone: [phone], Toll-Free: [phone], Fax: [phone] Page: 2 of 2 Call Id: [id number] Caller Understands: Yes Disagree/Comply: Comply Care Advice Given Per Guideline SEE PHYSICIAN WITHIN 4 HOURS (or PCP triage): COUGH MEDICINES: - OTC COUGH SYRUPS: The most common cough suppressant in OTC cough medications is dextromethorphan. Often the letters 'DM' appear in the name. - OTC COUGH DROPS: Cough drops can help a lot, especially for mild coughs. They reduce coughing by  soothing your irritated throat and removing that tickle sensation in the back of the  throat. Cough drops also have the advantage of portability - you can carry them with you. - HOME REMEDY - HARD CANDY: Hard candy works just as well as medicine-flavored OTC cough drops. Diabetics should use sugarfree candy. - HOME REMEDY - HONEY: This old home remedy has been shown to help decrease coughing at night. The adult dosage is 2 teaspoons (10 ml) at bedtime. Honey should not be given to infants under one year of age. OTC COUGH SYRUP - DEXTROMETHORPHAN: * Cough syrups containing the cough suppressant dextromethorphan (DM) may help decrease your cough. Cough syrups work best for coughs that keep you awake at night. They can also sometimes help in the late stages of a respiratory infection when the cough is dry and hacking. They can be used along with cough drops. CALL BACK IF: * You become worse. CARE ADVICE given per Cough - Acute Productive (Adult) guideline. After Care Instructions Given Call Event Type User Date / Time Description Comments User: Leverne Humbles, RN Date/Time Eilene Ghazi Time): 08/27/2015 9:02:49 AM no appt available with PCP. Appt scheduled at 2pm with Alma Friendly. Referrals REFERRED TO PCP OFFICE

## 2015-08-27 NOTE — Telephone Encounter (Signed)
Patient Name: Allison Yoder DOB: November 08, 1950 Initial Comment Caller she started to feel awful on Monday. Coughing up nasty stuff, body aches all over. Very tired. Tightness in chest from coughing. Little wheezing Nurse Assessment Nurse: Ronnald Ramp, RN, Miranda Date/Time (Eastern Time): 08/27/2015 8:54:25 AM Confirm and document reason for call. If symptomatic, describe symptoms. ---Caller states she has been feeling bad since Monday. She has a productive cough, tightness in her chest, and wheezing (described as rattling). Denies fever. She has body aches and fatigue. Has the patient traveled out of the country within the last 30 days? ---No Does the patient have any new or worsening symptoms? ---Yes Will a triage be completed? ---Yes Related visit to physician within the last 2 weeks? ---No Does the PT have any chronic conditions? (i.e. diabetes, asthma, etc.) ---Yes List chronic conditions. ---HTN, Depression, Is this a behavioral health or substance abuse call? ---No Guidelines Guideline Title Affirmed Question Affirmed Notes Cough - Acute Productive Wheezing is present Final Disposition User See Physician within 4 Hours (or PCP triage) Ronnald Ramp, RN, Miranda Comments no appt available with PCP. Appt scheduled at 2pm with Alma Friendly. Referrals REFERRED TO PCP OFFICE Disagree/Comply: Comply

## 2015-08-27 NOTE — Progress Notes (Signed)
Pre visit review using our clinic review tool, if applicable. No additional management support is needed unless otherwise documented below in the visit note. 

## 2015-09-02 ENCOUNTER — Telehealth: Payer: Self-pay | Admitting: Primary Care

## 2015-09-02 DIAGNOSIS — R059 Cough, unspecified: Secondary | ICD-10-CM

## 2015-09-02 DIAGNOSIS — R05 Cough: Secondary | ICD-10-CM

## 2015-09-02 MED ORDER — DOXYCYCLINE HYCLATE 100 MG PO TABS: 100.0000 mg | ORAL_TABLET | Freq: Two times a day (BID) | ORAL | Status: DC

## 2015-09-02 NOTE — Telephone Encounter (Signed)
Patient was seen last Wednesday.  She was told to call back with how she's feeling.  Patient said she feels a little better.  Patient said she takes [employer] day and night.  Patient's throat and back of her tongue are very sore.  Patient has no energy.  Patient was having a productive cough until yesterday. Since yesterday her cough has been unproductive,she has a headache and body aches.  Patient is asking if something can be called in to her pharmacy so she can feel better by Christmas.  [facility] uses [facility].

## 2015-09-02 NOTE — Telephone Encounter (Signed)
Since she is has not improved much, and her symptoms have been going on for a total of 9 days, will send in a doxycycline for possible bacterial involvement. She will take 1 tablet by mouth twice daily for 7 days. Her cough may linger up to a week after antibiotics, but she should be feeling improved.

## 2015-09-02 NOTE — Telephone Encounter (Signed)
Patient called back.  She can be reached at [phone] after 5 or anytime.

## 2015-09-03 NOTE — Telephone Encounter (Signed)
Called and notified patient of Kate's comments. Patient verbalized understanding.  

## 2015-09-13 DIAGNOSIS — I639 Cerebral infarction, unspecified: Secondary | ICD-10-CM

## 2015-09-13 HISTORY — DX: Cerebral infarction, unspecified: I63.9

## 2015-09-23 ENCOUNTER — Telehealth: Payer: Self-pay | Admitting: Family Medicine

## 2015-09-23 NOTE — Telephone Encounter (Signed)
Spoke to patient and was advised that she is feeling a little better today and  does not feel like getting dressed and coming in. Patient stated that she had a script for Doxycycline that she started taking Monday. Patient stated that today she does not have a fever. Offered patient an appointment tomorrow and was advised that she wants to wait and see how she feels in the morning because she thinks that she is over the worse of it. Patient stated that she will call back in the morning if she feels that she needs to be seen.

## 2015-09-23 NOTE — Telephone Encounter (Signed)
Noted. Thanks.

## 2015-09-23 NOTE — Telephone Encounter (Signed)
[facility] Patient Name: Allison Yoder DOB: 02/08/1951 Initial Comment Caller States she feels exhausted, non-productive cough, greenish nasal drainage Nurse Assessment Nurse: Mechele Dawley, RN, Amy Date/Time (Eastern Time): 09/23/2015 10:57:46 AM Confirm and document reason for call. If symptomatic, describe symptoms. ---SHE STATES THAT SHE IS FEELING BAD. NONPRODUCTIVE COUGH. SHE STARTED ON FRIDAY, IN BED SAT. STARTED FEELING WORSE ON MONDAY. SHE STARTED ON DOXYCYCLINE. SHE HAD LOW GRADE FEVER YESTERDAY. SHE IS HAVING FACIAL SORENESS. SHE HAS BEEN TAKING ALKA SELKER. SHE HAS BEEN GETTING NASTY GREEN CONGESTION FROM THE NOSE. COUGH IS STILL NON-PRODUCTIVE. SHE DOES NOT FEEL LIKE GETTING OUT. NO FEVER TODAY. SHE STATES THAT SHE FEELS A MAJOR DIFFERENCE SINCE SHE STARTED ON THE DOXYCYCLINE. NOT AS MUCH PRESSURE IN THE FACE AS SHE WAS HAVING. Has the patient traveled out of the country within the last 30 days? ---Not Applicable Does the patient have any new or worsening symptoms? ---Yes Will a triage be completed? ---Yes Related visit to physician within the last 2 weeks? ---No Does the PT have any chronic conditions? (i.e. diabetes, asthma, etc.) ---Yes List chronic conditions. ---SEE EPIC RECORDS Is this a behavioral health or substance abuse call? ---No Guidelines Guideline Title Affirmed Question Affirmed Notes Sinus Pain or Congestion Lots of coughing Final Disposition User See PCP When Office is Open (within 3 days) [place], Therapist, sports, Amy Referrals REFERRED TO PCP OFFICE REFERRED TO PCP OFFICE Disagree/Comply: Comply

## 2015-09-23 NOTE — Telephone Encounter (Signed)
Does she need to be scheduled? The 6PM OV today may be open.  Thanks.

## 2015-09-24 ENCOUNTER — Encounter: Payer: Self-pay | Admitting: Primary Care

## 2015-09-24 ENCOUNTER — Ambulatory Visit (INDEPENDENT_AMBULATORY_CARE_PROVIDER_SITE_OTHER): Payer: Medicare Other | Admitting: Primary Care

## 2015-09-24 VITALS — BP 118/74 | HR 87 | Temp 98.7°F | Ht 67.0 in | Wt 177.8 lb

## 2015-09-24 DIAGNOSIS — J209 Acute bronchitis, unspecified: Secondary | ICD-10-CM | POA: Diagnosis not present

## 2015-09-24 MED ORDER — PREDNISONE 20 MG PO TABS: ORAL_TABLET | ORAL | Status: DC

## 2015-09-24 NOTE — Progress Notes (Signed)
Subjective:    Patient ID: Allison Yoder, female    DOB: 1951-03-24, 65 y.o.   MRN: [id number]  HPI  Allison Yoder is a 65 year old female who presents today with a chief complaint of cough. She also reports ear pain, nasal congestion, shortness of breath, fatigue, sinus pressure. Her symptoms began Friday last week. She was treated for a viral URI in mid December which she mostly recovered. She had a script for Doxycycline, that was printed last visit, started taking it Monday this week, and has felt moderately improved. Denies fevers today but main concern is tightness to airways/shortness of breath. She's taken alka-selzer plus recently but stopped taking Monday.   Review of Systems  Constitutional: Positive for fatigue. Negative for fever and chills.  HENT: Positive for congestion and sinus pressure. Negative for ear pain and sore throat.   Respiratory: Positive for cough. Negative for shortness of breath.   Cardiovascular: Negative for chest pain.  Gastrointestinal: Negative for nausea.  Musculoskeletal: Negative for myalgias.       Past Medical History  Diagnosis Date  . Anxiety and depression   . Diabetes mellitus   . GERD (gastroesophageal reflux disease)   . Allergy   . Hypertension   . Hyperlipidemia   . Hypothyroidism   . History of chicken pox   . Migraines   . Urinary incontinence   . UTI (lower urinary tract infection)     recurrent  . Arthritis     L shoulder  . Iritis     per [facility]  . Diverticulosis   . Fatty liver   . Anemia   . Fibromyalgia   . Heart murmur     states dx 20 yr ago-very mild-n0 echo  . Snores   . Wears glasses     Social History   Social History  . Marital Status: Divorced    Spouse Name: N/A  . Number of Children: 1  . Years of Education: N/A   Occupational History  . 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     Social History Main Topics  . Smoking status: Former Smoker -- 2.00  packs/day for 5 years    Types: Cigarettes    Quit date: 09/12/1985  . Smokeless tobacco: Never Used  . Alcohol Use: 0.0 oz/week    0 Standard drinks or equivalent per week     Comment: social  . Drug Use: No  . Sexual Activity: Not on file   Other Topics Concern  . Not on file   Social History Narrative   Education:  Futures trader   Divorced and lives with her brother.      Past Surgical History  Procedure Laterality Date  . Abdominal hysterectomy  1992    Partial  . Bladder/pelvic tack  2001  . Breast biopsy  1964    benign tumor, right  . Tubal ligation    . Bladder polpectomy      x 3  . Rotator cuff repair Left   . Rotator cuff repair Right     Family History  Problem Relation Age of Onset  . Arthritis Maternal Grandmother   . Stroke Maternal Grandmother   . Diabetes Maternal Grandmother   . Heart disease Paternal Uncle     x 2  . Arthritis Sister   . Breast cancer Cousin   . Heart disease Paternal Aunt   . Diabetes Paternal Uncle   .  Arthritis Mother   . Heart disease Mother   . Hyperlipidemia Mother   . Hypertension Mother   . Diabetes Mother   . Depression Mother   . Alcohol abuse Father   . Lung cancer Father   . Diabetes Sister   . Heart disease Paternal Uncle     x 5  . Heart disease Paternal Aunt     x 3  . Stroke Paternal Aunt   . Colon cancer Neg Hx     Allergies  Allergen Reactions  . Cephalexin Itching  . Codeine Itching  . Inapsine [Droperidol] Shortness Of Breath, Anxiety and Hypertension    Elevated HR and BP, panic attack  . Lipitor [Atorvastatin Calcium]     Myalgias   . Niacin And Related     Flushing  . Pravastatin     Myalgias  . Adhesive [Tape] Rash    Blisters with tape    Current Outpatient Prescriptions on File Prior to Visit  Medication Sig Dispense Refill  . atenolol (TENORMIN) 50 MG tablet Take 1 tablet (50 mg total) by mouth daily. 90 tablet 3  . Calcium-Vitamin D 600-200 MG-UNIT per tablet Take 1  tablet by mouth 2 (two) times daily.    . cetirizine (ZYRTEC) 10 MG tablet Take 10 mg by mouth as needed.    . citalopram (CELEXA) 20 MG tablet TAKE ONE OR TWO TABLETS PER DAY 180 tablet 3  . clonazePAM (KLONOPIN) 0.5 MG tablet Take 0.5-1 tablets (0.25-0.5 mg total) by mouth 2 (two) times daily as needed for anxiety. 30 tablet 1  . doxycycline (VIBRA-TABS) 100 MG tablet Take 1 tablet (100 mg total) by mouth 2 (two) times daily. 14 tablet 0  . fluticasone (FLONASE) 50 MCG/ACT nasal spray INHALE 2 SPRAYS INTO EACH NOSTRIL EVERY DAY. 16 g 0  . hydrocortisone (ANUSOL-HC) 25 MG suppository INSERT 1 SUPPOSITORY RECTALLY TWICE A DAY 24 suppository 0  . lisinopril (PRINIVIL,ZESTRIL) 10 MG tablet Take 1 tablet (10 mg total) by mouth at bedtime. 90 tablet 3  . methocarbamol (ROBAXIN) 500 MG tablet Take 1 tablet (500 mg total) by mouth every 8 (eight) hours as needed for muscle spasms. 30 tablet 1  . Multiple Vitamin (MULTIVITAMIN) tablet Take 1 tablet by mouth daily.    . Multiple Vitamins-Minerals (HAIR/SKIN/NAILS PO) Take by mouth.    . nitrofurantoin (MACRODANTIN) 100 MG capsule Take 100 mg by mouth daily.    . phenazopyridine (PYRIDIUM) 95 MG tablet Take 95 mg by mouth as needed for pain.    . ranitidine (ZANTAC) 150 MG tablet Take 150 mg by mouth 2 (two) times daily.    . vitamin B-12 (CYANOCOBALAMIN) 1000 MCG tablet Take 1,000 mcg by mouth daily.     No current facility-administered medications on file prior to visit.    BP 118/74 mmHg  Pulse 87  Temp(Src) 98.7 F (37.1 C) (Oral)  Ht 5\' 7"  (1.702 m)  Wt 177 lb 12.8 oz (80.65 kg)  BMI 27.84 kg/m2  SpO2 96%    Objective:   Physical Exam  Constitutional: She appears well-nourished.  HENT:  Right Ear: Tympanic membrane and ear canal normal.  Left Ear: Tympanic membrane and ear canal normal.  Nose: Right sinus exhibits maxillary sinus tenderness. Right sinus exhibits no frontal sinus tenderness. Left sinus exhibits maxillary sinus  tenderness. Left sinus exhibits no frontal sinus tenderness.  Mouth/Throat: Oropharynx is clear and moist.  Cardiovascular: Normal rate and regular rhythm.   Pulmonary/Chest: Effort normal and breath sounds  normal. She has no wheezes. She has no rales.  Skin: Skin is warm and dry.          Assessment & Plan:  Acute Bronchitis:  Treated for viral URI in mid December, never felt fully recovered. Recent symptoms began 6 days ago. Started Doxycycline Monday this week with improvement. Exam with clear lungs which is reassuring (no wheezing). No suspicion for pneumonia. Main concern from patient is for airway tightness and SOB. Will have her continue Doxy course. Short burst of prednisone taper today. Fluids, rest. Call if no improvement.

## 2015-09-24 NOTE — Patient Instructions (Signed)
Start prednisone tablets to reduce the inflammation in your lungs. Take 2 tablets for 3 days then 1 tablet for 3 days.  Continue taking Doxycycline antibiotics.   Increase consumption of water to stay hydrated.  Call if no improvement in 3-4 days.  It was a pleasure to see you today!

## 2015-09-24 NOTE — Progress Notes (Signed)
Pre visit review using our clinic review tool, if applicable. No additional management support is needed unless otherwise documented below in the visit note. 

## 2015-10-26 ENCOUNTER — Encounter: Payer: Self-pay | Admitting: Family Medicine

## 2015-10-26 ENCOUNTER — Ambulatory Visit (INDEPENDENT_AMBULATORY_CARE_PROVIDER_SITE_OTHER): Payer: Medicare Other | Admitting: Family Medicine

## 2015-10-26 ENCOUNTER — Telehealth: Payer: Self-pay | Admitting: Family Medicine

## 2015-10-26 VITALS — BP 136/66 | HR 89 | Temp 100.4°F | Wt 181.5 lb

## 2015-10-26 DIAGNOSIS — R05 Cough: Secondary | ICD-10-CM

## 2015-10-26 DIAGNOSIS — R059 Cough, unspecified: Secondary | ICD-10-CM

## 2015-10-26 DIAGNOSIS — R6889 Other general symptoms and signs: Secondary | ICD-10-CM | POA: Diagnosis not present

## 2015-10-26 LAB — POC INFLUENZA A&B (BINAX/QUICKVUE)
Influenza A, POC: NEGATIVE
Influenza B, POC: NEGATIVE

## 2015-10-26 MED ORDER — ALBUTEROL SULFATE HFA 108 (90 BASE) MCG/ACT IN AERS: 1.0000 | INHALATION_SPRAY | Freq: Four times a day (QID) | RESPIRATORY_TRACT | Status: DC | PRN

## 2015-10-26 MED ORDER — OSELTAMIVIR PHOSPHATE 75 MG PO CAPS: 75.0000 mg | ORAL_CAPSULE | Freq: Two times a day (BID) | ORAL | Status: DC

## 2015-10-26 NOTE — Telephone Encounter (Signed)
[facility] Patient Name: Allison Yoder DOB: 1950/10/07 Initial Comment Caller states she woke up with a headache, nasal congestion, body aches, stomach upset. Nurse Assessment Nurse: Markus Daft, RN, Sherre Poot Date/Time (Eastern Time): 10/26/2015 8:26:28 AM Confirm and document reason for call. If symptomatic, describe symptoms. You must click the next button to save text entered. ---Caller states she has headache, nasal congestion, cough, body aches, upset stomach. S/S for 3 days. When s/s started, she did not have a fever. Did not come on abruptly. She just got over being sick. Has the patient traveled out of the country within the last 30 days? ---Not Applicable Does the patient have any new or worsening symptoms? ---Yes Will a triage be completed? ---Yes Related visit to physician within the last 2 weeks? ---No Does the PT have any chronic conditions? (i.e. diabetes, asthma, etc.) ---No Is this a behavioral health or substance abuse call? ---No Guidelines Guideline Title Affirmed Question Affirmed Notes Influenza - Seasonal [1] Using nasal washes and pain medicine > 24 hours AND [2] sinus pain (around cheekbone or eye) persists Final Disposition User See Physician within [place], RN, [place] Comments RN scheduled appt with Dr. Damita Dunnings today at 11:45 am. Referrals REFERRED TO PCP OFFICE Disagree/Comply: Comply

## 2015-10-26 NOTE — Telephone Encounter (Signed)
Pt has appt 10/26/15 at 11:45 with Dr Damita Dunnings.

## 2015-10-26 NOTE — Patient Instructions (Signed)
Rest and fluids in the meantime.  Start tamiflu and use the inhaler if needed. Take care.  Update me as needed.  Glad to see you.

## 2015-10-26 NOTE — Progress Notes (Signed)
Pre visit review using our clinic review tool, if applicable. No additional management support is needed unless otherwise documented below in the visit note.  Sx started 2 days ago.  Diffuse aches.  HA.  Fever.  Cough.  Taking tylenol and ibuprofen.  Taking otc cold meds.  Sx came on quickly.  No sputum.  Some wheeze.  Sick exposures, presumed. Some indigestion recently.  No vomiting.    Meds, vitals, and allergies reviewed.   ROS: See HPI.  Otherwise, noncontributory.  GEN: nad, alert and oriented HEENT: mucous membranes moist, tm w/o erythema, nasal exam w/o erythema, clear discharge noted,  OP with cobblestoning NECK: supple w/o LA CV: rrr.   PULM: ctab except for scattered B rhonchi, no focal dec in BS, speaking in complete sentences, no inc wob EXT: no edema SKIN: no acute rash  Flu neg, d/w pt about possible false neg.

## 2015-10-27 ENCOUNTER — Emergency Department (HOSPITAL_COMMUNITY): Payer: Medicare Other

## 2015-10-27 ENCOUNTER — Inpatient Hospital Stay (HOSPITAL_COMMUNITY)
Admission: EM | Admit: 2015-10-27 | Discharge: 2015-11-01 | DRG: 871 | Disposition: A | Discharge status: Home / Self Care | Payer: Medicare Other | Attending: Internal Medicine | Admitting: Internal Medicine

## 2015-10-27 ENCOUNTER — Encounter (HOSPITAL_COMMUNITY): Payer: Self-pay | Admitting: Neurology

## 2015-10-27 ENCOUNTER — Telehealth: Payer: Self-pay | Admitting: Family Medicine

## 2015-10-27 DIAGNOSIS — E785 Hyperlipidemia, unspecified: Secondary | ICD-10-CM | POA: Diagnosis present

## 2015-10-27 DIAGNOSIS — F419 Anxiety disorder, unspecified: Secondary | ICD-10-CM | POA: Diagnosis present

## 2015-10-27 DIAGNOSIS — I10 Essential (primary) hypertension: Secondary | ICD-10-CM | POA: Diagnosis present

## 2015-10-27 DIAGNOSIS — F329 Major depressive disorder, single episode, unspecified: Secondary | ICD-10-CM | POA: Diagnosis present

## 2015-10-27 DIAGNOSIS — M797 Fibromyalgia: Secondary | ICD-10-CM | POA: Diagnosis present

## 2015-10-27 DIAGNOSIS — Z87891 Personal history of nicotine dependence: Secondary | ICD-10-CM

## 2015-10-27 DIAGNOSIS — J159 Unspecified bacterial pneumonia: Secondary | ICD-10-CM | POA: Diagnosis present

## 2015-10-27 DIAGNOSIS — E119 Type 2 diabetes mellitus without complications: Secondary | ICD-10-CM | POA: Diagnosis present

## 2015-10-27 DIAGNOSIS — A419 Sepsis, unspecified organism: Secondary | ICD-10-CM | POA: Diagnosis present

## 2015-10-27 DIAGNOSIS — K219 Gastro-esophageal reflux disease without esophagitis: Secondary | ICD-10-CM | POA: Diagnosis present

## 2015-10-27 DIAGNOSIS — J189 Pneumonia, unspecified organism: Secondary | ICD-10-CM

## 2015-10-27 DIAGNOSIS — K5730 Diverticulosis of large intestine without perforation or abscess without bleeding: Secondary | ICD-10-CM | POA: Diagnosis present

## 2015-10-27 DIAGNOSIS — R509 Fever, unspecified: Secondary | ICD-10-CM

## 2015-10-27 DIAGNOSIS — G4733 Obstructive sleep apnea (adult) (pediatric): Secondary | ICD-10-CM | POA: Diagnosis present

## 2015-10-27 DIAGNOSIS — R109 Unspecified abdominal pain: Secondary | ICD-10-CM

## 2015-10-27 DIAGNOSIS — N390 Urinary tract infection, site not specified: Secondary | ICD-10-CM | POA: Diagnosis present

## 2015-10-27 DIAGNOSIS — E039 Hypothyroidism, unspecified: Secondary | ICD-10-CM | POA: Diagnosis present

## 2015-10-27 DIAGNOSIS — R739 Hyperglycemia, unspecified: Secondary | ICD-10-CM

## 2015-10-27 DIAGNOSIS — R6889 Other general symptoms and signs: Secondary | ICD-10-CM | POA: Insufficient documentation

## 2015-10-27 DIAGNOSIS — R1084 Generalized abdominal pain: Secondary | ICD-10-CM | POA: Diagnosis not present

## 2015-10-27 DIAGNOSIS — F32A Depression, unspecified: Secondary | ICD-10-CM

## 2015-10-27 HISTORY — DX: Pneumonia, unspecified organism: J18.9

## 2015-10-27 HISTORY — DX: Urinary tract infection, site not specified: N39.0

## 2015-10-27 HISTORY — DX: Obstructive sleep apnea (adult) (pediatric): Z99.89

## 2015-10-27 HISTORY — DX: Low back pain, unspecified: M54.50

## 2015-10-27 HISTORY — DX: Type 2 diabetes mellitus without complications: E11.9

## 2015-10-27 HISTORY — DX: Obstructive sleep apnea (adult) (pediatric): G47.33

## 2015-10-27 HISTORY — DX: Low back pain: M54.5

## 2015-10-27 HISTORY — DX: Anxiety disorder, unspecified: F41.9

## 2015-10-27 HISTORY — DX: Other chronic pain: G89.29

## 2015-10-27 LAB — URINE MICROSCOPIC-ADD ON

## 2015-10-27 LAB — CBC WITH DIFFERENTIAL/PLATELET
Basophils Absolute: 0 10*3/uL (ref 0.0–0.1)
Basophils Relative: 0 %
Eosinophils Absolute: 0 10*3/uL (ref 0.0–0.7)
Eosinophils Relative: 0 %
HCT: 45.1 % (ref 36.0–46.0)
Hemoglobin: 14.6 g/dL (ref 12.0–15.0)
Lymphocytes Relative: 15 %
Lymphs Abs: 1.7 10*3/uL (ref 0.7–4.0)
MCH: 29.8 pg (ref 26.0–34.0)
MCHC: 32.4 g/dL (ref 30.0–36.0)
MCV: 92 fL (ref 78.0–100.0)
Monocytes Absolute: 0.8 10*3/uL (ref 0.1–1.0)
Monocytes Relative: 7 %
Neutro Abs: 9.1 10*3/uL — ABNORMAL HIGH (ref 1.7–7.7)
Neutrophils Relative %: 78 %
Platelets: 182 10*3/uL (ref 150–400)
RBC: 4.9 MIL/uL (ref 3.87–5.11)
RDW: 14.3 % (ref 11.5–15.5)
WBC: 11.6 10*3/uL — ABNORMAL HIGH (ref 4.0–10.5)

## 2015-10-27 LAB — GLUCOSE, CAPILLARY
Glucose-Capillary: 68 mg/dL (ref 65–99)
Glucose-Capillary: 123 mg/dL — ABNORMAL HIGH (ref 65–99)

## 2015-10-27 LAB — COMPREHENSIVE METABOLIC PANEL
ALT: 22 U/L (ref 14–54)
AST: 23 U/L (ref 15–41)
Albumin: 3.6 g/dL (ref 3.5–5.0)
Alkaline Phosphatase: 91 U/L (ref 38–126)
Anion gap: 15 (ref 5–15)
BUN: 10 mg/dL (ref 6–20)
CO2: 21 mmol/L — ABNORMAL LOW (ref 22–32)
Calcium: 9.5 mg/dL (ref 8.9–10.3)
Chloride: 100 mmol/L — ABNORMAL LOW (ref 101–111)
Creatinine, Ser: 0.99 mg/dL (ref 0.44–1.00)
GFR calc Af Amer: >60 mL/min (ref >60)
GFR calc non Af Amer: 59 mL/min — ABNORMAL LOW (ref >60)
Glucose, Bld: 232 mg/dL — ABNORMAL HIGH (ref 65–99)
Potassium: 4.1 mmol/L (ref 3.5–5.1)
Sodium: 136 mmol/L (ref 135–145)
Total Bilirubin: 1.4 mg/dL — ABNORMAL HIGH (ref 0.3–1.2)
Total Protein: 7.5 g/dL (ref 6.5–8.1)

## 2015-10-27 LAB — I-STAT CG4 LACTIC ACID, ED
Lactic Acid, Venous: 3.16 mmol/L (ref 0.5–2.0)
Lactic Acid, Venous: 2.48 mmol/L (ref 0.5–2.0)

## 2015-10-27 LAB — URINALYSIS, ROUTINE W REFLEX MICROSCOPIC
Bilirubin Urine: NEGATIVE
Glucose, UA: NEGATIVE mg/dL
Hgb urine dipstick: NEGATIVE
Ketones, ur: NEGATIVE mg/dL
Leukocytes, UA: NEGATIVE
Nitrite: NEGATIVE
Protein, ur: 30 mg/dL — AB
Specific Gravity, Urine: 1.025 (ref 1.005–1.030)
pH: 8 (ref 5.0–8.0)

## 2015-10-27 LAB — STREP PNEUMONIAE URINARY ANTIGEN: Strep Pneumo Urinary Antigen: NEGATIVE

## 2015-10-27 LAB — INFLUENZA PANEL BY PCR (TYPE A & B)
H1N1 flu by pcr: NOT DETECTED
Influenza A By PCR: NEGATIVE
Influenza B By PCR: NEGATIVE

## 2015-10-27 LAB — APTT: aPTT: 36 seconds (ref 24–37)

## 2015-10-27 LAB — PROTIME-INR
INR: 1.39 (ref 0.00–1.49)
Prothrombin Time: 17.1 seconds — ABNORMAL HIGH (ref 11.6–15.2)

## 2015-10-27 LAB — PROCALCITONIN: Procalcitonin: 0.1 ng/mL

## 2015-10-27 MED ORDER — INSULIN ASPART 100 UNIT/ML ~~LOC~~ SOLN
0.0000 [IU] | Freq: Three times a day (TID) | SUBCUTANEOUS | Status: DC
  Administered 2015-10-28: 2 [IU] via SUBCUTANEOUS
  Administered 2015-10-28 – 2015-10-31 (×7): 1 [IU] via SUBCUTANEOUS
  Administered 2015-10-31 – 2015-11-01 (×2): 2 [IU] via SUBCUTANEOUS

## 2015-10-27 MED ORDER — KETOROLAC TROMETHAMINE 30 MG/ML IJ SOLN
30.0000 mg | Freq: Once | INTRAMUSCULAR | Status: AC
  Administered 2015-10-27: 30 mg via INTRAVENOUS
  Filled 2015-10-27: qty 1

## 2015-10-27 MED ORDER — LEVOFLOXACIN IN D5W 750 MG/150ML IV SOLN
750.0000 mg | INTRAVENOUS | Status: DC
  Administered 2015-10-28 – 2015-11-01 (×5): 750 mg via INTRAVENOUS
  Filled 2015-10-27 (×5): qty 150

## 2015-10-27 MED ORDER — ONDANSETRON HCL 4 MG/2ML IJ SOLN: 4.0000 mg | Freq: Four times a day (QID) | INTRAMUSCULAR | Status: DC | PRN

## 2015-10-27 MED ORDER — IPRATROPIUM-ALBUTEROL 0.5-2.5 (3) MG/3ML IN SOLN
3.0000 mL | RESPIRATORY_TRACT | Status: DC | PRN
  Administered 2015-10-27 – 2015-11-01 (×3): 3 mL via RESPIRATORY_TRACT
  Filled 2015-10-27 (×3): qty 3

## 2015-10-27 MED ORDER — VANCOMYCIN HCL IN DEXTROSE 1-5 GM/200ML-% IV SOLN
1000.0000 mg | Freq: Two times a day (BID) | INTRAVENOUS | Status: DC
  Administered 2015-10-27 – 2015-10-31 (×8): 1000 mg via INTRAVENOUS
  Filled 2015-10-27 (×9): qty 200

## 2015-10-27 MED ORDER — CITALOPRAM HYDROBROMIDE 20 MG PO TABS: 20.0000 mg | ORAL_TABLET | Freq: Two times a day (BID) | ORAL | Status: DC

## 2015-10-27 MED ORDER — AZTREONAM 2 G IJ SOLR: 2.0000 g | Freq: Once | INTRAMUSCULAR | Status: DC

## 2015-10-27 MED ORDER — LORATADINE 10 MG PO TABS
10.0000 mg | ORAL_TABLET | Freq: Every day | ORAL | Status: DC
  Administered 2015-10-27 – 2015-11-01 (×6): 10 mg via ORAL
  Filled 2015-10-27 (×6): qty 1

## 2015-10-27 MED ORDER — SODIUM CHLORIDE 0.9 % IV BOLUS (SEPSIS)
1000.0000 mL | INTRAVENOUS | Status: AC
  Administered 2015-10-27 (×2): 1000 mL via INTRAVENOUS

## 2015-10-27 MED ORDER — VANCOMYCIN HCL IN DEXTROSE 1-5 GM/200ML-% IV SOLN
1000.0000 mg | Freq: Once | INTRAVENOUS | Status: AC
  Administered 2015-10-27: 1000 mg via INTRAVENOUS
  Filled 2015-10-27: qty 200

## 2015-10-27 MED ORDER — ACETAMINOPHEN 325 MG PO TABS
325.0000 mg | ORAL_TABLET | Freq: Once | ORAL | Status: AC
  Administered 2015-10-27: 325 mg via ORAL
  Filled 2015-10-27: qty 1

## 2015-10-27 MED ORDER — ATENOLOL 50 MG PO TABS
50.0000 mg | ORAL_TABLET | Freq: Every day | ORAL | Status: DC
  Administered 2015-10-27 – 2015-11-01 (×6): 50 mg via ORAL
  Filled 2015-10-27 (×6): qty 1

## 2015-10-27 MED ORDER — SODIUM CHLORIDE 0.9 % IV SOLN: INTRAVENOUS | Status: AC

## 2015-10-27 MED ORDER — ACETAMINOPHEN 500 MG PO TABS
1000.0000 mg | ORAL_TABLET | Freq: Three times a day (TID) | ORAL | Status: DC | PRN
  Administered 2015-10-27 – 2015-10-31 (×11): 1000 mg via ORAL
  Filled 2015-10-27 (×12): qty 2

## 2015-10-27 MED ORDER — INSULIN ASPART 100 UNIT/ML ~~LOC~~ SOLN
0.0000 [IU] | Freq: Every day | SUBCUTANEOUS | Status: DC
  Administered 2015-10-30: 2 [IU] via SUBCUTANEOUS

## 2015-10-27 MED ORDER — ONDANSETRON HCL 4 MG PO TABS
4.0000 mg | ORAL_TABLET | Freq: Four times a day (QID) | ORAL | Status: DC | PRN
  Administered 2015-10-28: 4 mg via ORAL
  Filled 2015-10-27: qty 1

## 2015-10-27 MED ORDER — LEVOFLOXACIN IN D5W 750 MG/150ML IV SOLN
750.0000 mg | Freq: Once | INTRAVENOUS | Status: AC
  Administered 2015-10-27: 750 mg via INTRAVENOUS
  Filled 2015-10-27: qty 150

## 2015-10-27 MED ORDER — LISINOPRIL 10 MG PO TABS
10.0000 mg | ORAL_TABLET | Freq: Every day | ORAL | Status: DC
  Administered 2015-10-27 – 2015-10-31 (×5): 10 mg via ORAL
  Filled 2015-10-27 (×5): qty 1

## 2015-10-27 MED ORDER — CITALOPRAM HYDROBROMIDE 20 MG PO TABS
20.0000 mg | ORAL_TABLET | Freq: Two times a day (BID) | ORAL | Status: DC
  Administered 2015-10-27 – 2015-11-01 (×10): 20 mg via ORAL
  Filled 2015-10-27 (×11): qty 1

## 2015-10-27 MED ORDER — VANCOMYCIN HCL IN DEXTROSE 1-5 GM/200ML-% IV SOLN: 1000.0000 mg | Freq: Once | INTRAVENOUS | Status: DC

## 2015-10-27 MED ORDER — GUAIFENESIN ER 600 MG PO TB12
1200.0000 mg | ORAL_TABLET | Freq: Two times a day (BID) | ORAL | Status: DC
  Administered 2015-10-27 – 2015-11-01 (×9): 1200 mg via ORAL
  Filled 2015-10-27 (×11): qty 2

## 2015-10-27 MED ORDER — SODIUM CHLORIDE 0.9 % IV BOLUS (SEPSIS)
500.0000 mL | INTRAVENOUS | Status: AC
  Administered 2015-10-27: 500 mL via INTRAVENOUS

## 2015-10-27 MED ORDER — LEVOFLOXACIN IN D5W 750 MG/150ML IV SOLN: 750.0000 mg | Freq: Once | INTRAVENOUS | Status: DC

## 2015-10-27 MED ORDER — ENOXAPARIN SODIUM 40 MG/0.4ML ~~LOC~~ SOLN
40.0000 mg | SUBCUTANEOUS | Status: DC
  Administered 2015-10-27 – 2015-10-31 (×5): 40 mg via SUBCUTANEOUS
  Filled 2015-10-27 (×5): qty 0.4

## 2015-10-27 MED ORDER — ACETAMINOPHEN 325 MG PO TABS
650.0000 mg | ORAL_TABLET | Freq: Once | ORAL | Status: AC | PRN
  Administered 2015-10-27: 650 mg via ORAL
  Filled 2015-10-27: qty 2

## 2015-10-27 NOTE — Progress Notes (Signed)
CBG of 68. Hypoglycemia protocol initiated. Recheck CBG was 83. MD made aware. Will continue to monitor pt.

## 2015-10-27 NOTE — Telephone Encounter (Signed)
[place] Patient Name: Allison Yoder DOB: Jul 12, 1951 Initial Comment Caller states his friend has fever of 104.2 and vomiting Nurse Assessment Nurse: Markus Daft, RN, Sherre Poot Date/Time (Eastern Time): 10/27/2015 8:36:41 AM Confirm and document reason for call. If symptomatic, describe symptoms. You must click the next button to save text entered. ---Caller states she has fever of 103.2 orally at 6:30 am, and vomited twice. Severe HA. S/S started today. (( Note from yesterday: Caller states she has headache, nasal congestion, cough, body aches, upset stomach. S/S for 3 days. When s/s started, she didnot have a fever. Did not come on abruptly. She just got over being sick. --- advised to be seen -- Seen by Dr. Damita Dunnings and swabbed for influenza but it was not positive for flu, but MD still thought was flu and prescribed and given Tamiflu. Took first dose 1 pm and 1 am last night.)) Has the patient traveled out of the country within the last 30 days? ---Not Applicable Does the patient have any new or worsening symptoms? ---Yes Will a triage be completed? ---Yes Related visit to physician within the last 2 weeks? ---Yes Does the PT have any chronic conditions? (i.e. diabetes, asthma, etc.) ---No Is this a behavioral health or substance abuse call? ---No Guidelines Guideline Title Affirmed Question Affirmed Notes Influenza Follow-up Call [1] Difficulty breathing AND [2] not severe AND [3] not from stuffy nose (e.g., not relieved by cleaning out the nose) Final Disposition User Go to ED Now Markus Daft, RN, Windy Comments Caller understands advice, but wants to speak to her MD. RN called back line.  Not answering.  Referrals GO TO FACILITY UNDECIDED Disagree/Comply: Comply

## 2015-10-27 NOTE — ED Notes (Signed)
Attempted report to 6E 

## 2015-10-27 NOTE — ED Notes (Signed)
Pt here with fever, body aches since Saturday. Went to pcp yesterday and tested negative for flu but started on tamiflu based on s/s. This morning vomiting, fever. Today 102.9.

## 2015-10-27 NOTE — Assessment & Plan Note (Signed)
Flu neg, d/w pt about possible false neg.  Still okay for outpatient f/u.  No sign of CAP on exam.   Start tamiflu, see AVS.  Update me as needed.  She agrees.

## 2015-10-27 NOTE — ED Notes (Signed)
Patient transported to X-ray 

## 2015-10-27 NOTE — Telephone Encounter (Signed)
I just saw this.  What is the patient's status? Let me know.  Thanks.

## 2015-10-27 NOTE — H&P (Addendum)
[facility] History and Physical  Allison Yoder [id number] DOB: [birth year] DOA: 10/27/2015  Referring physician: Dr Johnney Killian - [facility] PCP: Elsie Stain, MD   Chief Complaint: Cough, fever, general malaise  HPI: Allison Yoder is a 65 y.o. female  Patient presented with somewhat protracted illness. Patient currently complaining of multiple symptoms including fevers, nausea, vomiting, generalized body aches (including a feeling of "all of my bones feel broken"), . Fever home up to 102. 2 bouts of nonbloody nonbilious emesis in the past day. Abdominal discomfort is generalized in nature and is not associated with diarrhea or constipation. Patient's symptoms are constant and getting worse. Associated with cough and upper respiratory symptoms such as runny nose and congestion. Patient states that she's been fighting a continual upper respiratory infection since December and was recently on doxycycline for sinusitis (2 wks ago). Patient states that her symptoms have gotten significantly worse over the last 2 days.    Review of Systems:  Constitutional:  No weight loss, night sweats,  HEENT:  No swallowing,Tooth/dental problems Cardio-vascular:  No chest pain, Orthopnea, PND, swelling in lower extremities, anasarca GI:  No heartburn, indigestion, abdominal pain, Resp: Per HPi Skin:  no rash or lesions.  GU:  no dysuria, change in color of urine, no urgency or frequency. No flank pain.  Musculoskeletal:  Per HPI Psych:  No change in mood or affect. No depression or anxiety. No memory loss.  Neuro:  No change in sensation, unilateral strength, or cognitive abilities  All other systems were reviewed and are negative.  Past Medical History  Diagnosis Date  . Anxiety and depression   . Diabetes mellitus   . GERD (gastroesophageal reflux disease)   . Allergy   . Hypertension   . Hyperlipidemia   . Hypothyroidism   . History of chicken pox   . Migraines   . Urinary  incontinence   . UTI (lower urinary tract infection)     recurrent  . Arthritis     L shoulder  . Iritis     per [facility]  . Diverticulosis   . Fatty liver   . Anemia   . Fibromyalgia   . Heart murmur     states dx 20 yr ago-very mild-n0 echo  . Snores   . Wears glasses    Past Surgical History  Procedure Laterality Date  . Abdominal hysterectomy  1992    Partial  . Bladder/pelvic tack  2001  . Breast biopsy  1964    benign tumor, right  . Tubal ligation    . Bladder polpectomy      x 3  . Rotator cuff repair Left   . Rotator cuff repair Right    Social History:  reports that she quit smoking about 30 years ago. Her smoking use included Cigarettes. She has a 10 pack-year smoking history. She has never used smokeless tobacco. She reports that she drinks alcohol. She reports that she does not use illicit drugs.  Allergies  Allergen Reactions  . Cephalexin Itching  . Codeine Itching  . Inapsine [Droperidol] Shortness Of Breath, Anxiety and Hypertension    Elevated HR and BP, panic attack  . Lipitor [Atorvastatin Calcium]     Myalgias   . Niacin And Related     Flushing  . Pravastatin     Myalgias  . Adhesive [Tape] Rash    Blisters with tape    Family History  Problem Relation Age of Onset  . Arthritis Maternal Grandmother   .  Stroke Maternal Grandmother   . Diabetes Maternal Grandmother   . Heart disease Paternal Uncle     x 2  . Arthritis Sister   . Breast cancer Cousin   . Heart disease Paternal Aunt   . Diabetes Paternal Uncle   . Arthritis Mother   . Heart disease Mother   . Hyperlipidemia Mother   . Hypertension Mother   . Diabetes Mother   . Depression Mother   . Alcohol abuse Father   . Lung cancer Father   . Diabetes Sister   . Heart disease Paternal Uncle     x 5  . Heart disease Paternal Aunt     x 3  . Stroke Paternal Aunt   . Colon cancer Neg Hx      Prior to Admission medications   Medication Sig Start Date End Date  Taking? Authorizing Provider  albuterol (PROVENTIL HFA;VENTOLIN HFA) 108 (90 Base) MCG/ACT inhaler Inhale 1-2 puffs into the lungs every 6 (six) hours as needed for wheezing or shortness of breath. 10/26/15  Yes Tonia Ghent, MD  atenolol (TENORMIN) 50 MG tablet Take 1 tablet (50 mg total) by mouth daily. 04/28/15  Yes Tonia Ghent, MD  Calcium-Vitamin D 600-200 MG-UNIT per tablet Take 1 tablet by mouth 2 (two) times daily.   Yes Historical Provider, MD  cetirizine (ZYRTEC) 10 MG tablet Take 10 mg by mouth as needed.   Yes Historical Provider, MD  citalopram (CELEXA) 20 MG tablet TAKE ONE OR TWO TABLETS PER DAY Patient taking differently: Take 20 mg by mouth 2 (two) times daily. TAKE ONE OR TWO TABLETS PER DAY 04/28/15  Yes Tonia Ghent, MD  lisinopril (PRINIVIL,ZESTRIL) 10 MG tablet Take 1 tablet (10 mg total) by mouth at bedtime. 04/28/15  Yes Tonia Ghent, MD  Multiple Vitamin (MULTIVITAMIN) tablet Take 1 tablet by mouth daily.   Yes Historical Provider, MD  nitrofurantoin (MACRODANTIN) 100 MG capsule Take 100 mg by mouth daily.   Yes Historical Provider, MD  ranitidine (ZANTAC) 150 MG tablet Take 150 mg by mouth daily.    Yes Historical Provider, MD  vitamin B-12 (CYANOCOBALAMIN) 1000 MCG tablet Take 1,000 mcg by mouth daily.   Yes Historical Provider, MD  clonazePAM (KLONOPIN) 0.5 MG tablet Take 0.5-1 tablets (0.25-0.5 mg total) by mouth 2 (two) times daily as needed for anxiety. Patient not taking: Reported on 10/27/2015 02/26/14   Tonia Ghent, MD  fluticasone [facility]) 50 MCG/ACT nasal spray INHALE 2 SPRAYS INTO EACH NOSTRIL EVERY DAY. Patient not taking: Reported on 10/27/2015 02/03/15   Tonia Ghent, MD  hydrocortisone (ANUSOL-HC) 25 MG suppository INSERT 1 SUPPOSITORY RECTALLY TWICE A DAY Patient not taking: Reported on 10/27/2015 01/30/15   Tonia Ghent, MD  methocarbamol (ROBAXIN) 500 MG tablet Take 1 tablet (500 mg total) by mouth every 8 (eight) hours as needed for muscle  spasms. Patient not taking: Reported on 10/27/2015 07/19/13   Tonia Ghent, MD   Physical Exam: Filed Vitals:   10/27/15 1200 10/27/15 1215 10/27/15 1236 10/27/15 1245  BP: 135/71 152/75 145/76 148/73  Pulse: 92 93 96 94  Temp:   100.3 F (37.9 C)   TempSrc:   Oral   Resp: 19 17 24 20   SpO2: 98% 98% 99% 97%    Wt Readings from Last 3 Encounters:  10/26/15 82.328 kg (181 lb 8 oz)  09/24/15 80.65 kg (177 lb 12.8 oz)  08/27/15 81.103 kg (178 lb 12.8 oz)  General: Appears ill, but calm Eyes:  PERRL, EOMI, normal lids, iris ENT:  grossly normal hearing, lips & tongue Neck:  no LAD, masses or thyromegaly Cardiovascular:  RRR, II/VI systolic murmur, trace LE edema.  Respiratory: decreased in bases, mild intermittent wheezing. Increased effort.  Abdomen:  soft, ntnd Skin:  no rash or induration seen on limited exam Musculoskeletal:  grossly normal tone BUE/BLE Psychiatric:  grossly normal mood and affect, speech fluent and appropriate Neurologic:  CN 2-12 grossly intact, moves all extremities in coordinated fashion.          Labs on Admission:  Basic Metabolic Panel:  Recent Labs Lab 10/27/15 0950  NA 136  K 4.1  CL 100*  CO2 21*  GLUCOSE 232*  BUN 10  CREATININE 0.99  CALCIUM 9.5   Liver Function Tests:  Recent Labs Lab 10/27/15 0950  AST 23  ALT 22  ALKPHOS 91  BILITOT 1.4*  PROT 7.5  ALBUMIN 3.6   No results for input(s): LIPASE, AMYLASE in the last 168 hours. No results for input(s): AMMONIA in the last 168 hours. CBC:  Recent Labs Lab 10/27/15 0950  WBC 11.6*  NEUTROABS 9.1*  HGB 14.6  HCT 45.1  MCV 92.0  PLT 182   Cardiac Enzymes: No results for input(s): CKTOTAL, CKMB, CKMBINDEX, TROPONINI in the last 168 hours.  BNP (last 3 results) No results for input(s): BNP in the last 8760 hours.  ProBNP (last 3 results) No results for input(s): PROBNP in the last 8760 hours.   CREATININE: 0.99 (10/27/15 0950) Estimated creatinine  clearance - 62.5 mL/min  CBG: No results for input(s): GLUCAP in the last 168 hours.  Radiological Exams on Admission: Dg Chest 2 View  10/27/2015  CLINICAL DATA:  Fever, dry cough, body aches, decreased appetite for 3 days, hypertension, diabetes mellitus, GERD EXAM: CHEST  2 VIEW COMPARISON:  None FINDINGS: Normal heart size, mediastinal contours, and pulmonary vascularity. RIGHT lower lobe infiltrate most likely representing pneumonia. Underlying bronchitic changes. Remaining lungs grossly clear. No pleural effusion or pneumothorax. Bones demineralized. IMPRESSION: RIGHT lobe opacity most likely representing pneumonia. Followup PA and lateral chest X-ray is recommended in 3-4 weeks following trial of antibiotic therapy to ensure resolution and exclude underlying malignancy. Electronically Signed   By: Lavonia Dana M.D.   On: 10/27/2015 10:33      Assessment/Plan Active Problems:   OSA (obstructive sleep apnea)   Pneumonia   Depression   Sepsis (HCC)   CAP (community acquired pneumonia)   Essential hypertension   65 yo F with history of anxiety, depression, GERD, HTN, HLD, hypothyroidism, migraines, fatty liver disease, fibromyalgia and urologic complications and chronic UTIs, presenting with mild sepsis secondary to acute respiratory failure secondary to CPAP.  Sepsis: Mild. Likely secondary to Acute respiratory distress secondary to CAP. Ongoing respiratory illness for several months with likely underlying viral etiology which led to secondary bacterial pneumonia. Lactic acid 3.16 at initial presentation, down trending to 2.48 after IV fluids and antibiotics. Tachycardic, febrile, tachypnea, WBC 11.6, left shift.  - Inpatient - IVF - continue levaquin and vanc - Follow-up blood cultures, sputum cultures - Legionella and strep antigen tests - O2 PRN - Duoneb PRN - Mucinex - consider DC macrobid (pt on this for 2 years for UTI prophylaxis - may be causing mild pulm fibrosis which  has inhibited pts ability to clear Resp infection)  Hyperglycemia: previously DM. Last A1c 5.1. Pt w/ wt loss and exercise reversed condition years ago. Glucose at admission  232 - A1c - SSI  HTN: - continue atenolol, lisinpril  Depression: - continue celexa  OSA: - cpap  Chronic UTI: on macrobid for >28yrs.  - f/u Urology to change ABX regimen due to prolonged use causing pulm fibrosis.    Code Status: FULL DVT Prophylaxis: lovenox Family Communication: friend Disposition Plan: Pending Improvement       Allison Yoder J, MD Family Medicine [facility] [email or web address] Password [email or web address]

## 2015-10-27 NOTE — Telephone Encounter (Signed)
Left message for patient to return call.

## 2015-10-27 NOTE — Telephone Encounter (Signed)
Dr. Damita Dunnings stated that patient was admitted to the hospital with pneumonia.

## 2015-10-27 NOTE — Progress Notes (Signed)
Pharmacy Antibiotic Note  Allison Yoder is a 65 y.o. female admitted on 10/27/2015 with sepsis.  Pharmacy has been consulted for levaquin and vancomycin dosing.  Pt received levaquin 750mg  and vancomycin 1g once in the ED.  Plan: Vancomycin 1g IV every 12 hours.  Goal trough 15-20 mcg/mL.  Levaquin 750mg  IV q24h Monitor culture data, renal function and clinical course VT at SS prn     Temp (24hrs), Avg:101.7 F (38.7 C), Min:100.4 F (38 C), Max:102.9 F (39.4 C)   Recent Labs Lab 10/27/15 0950 10/27/15 1000  WBC 11.6*  --   CREATININE 0.99  --   LATICACIDVEN  --  3.16*    CrCl cannot be calculated (Patient has no serum creatinine result on file.).    Allergies  Allergen Reactions  . Cephalexin Itching  . Codeine Itching  . Inapsine [Droperidol] Shortness Of Breath, Anxiety and Hypertension    Elevated HR and BP, panic attack  . Lipitor [Atorvastatin Calcium]     Myalgias   . Niacin And Related     Flushing  . Pravastatin     Myalgias  . Adhesive [Tape] Rash    Blisters with tape    Antimicrobials this admission: Levaquin 2/14 >>  Vancomycin 2/14 >>  Dose adjustments this admission: n/a  Microbiology results: 2/14 BCx: sent  UCx:    Sputum:    MRSA PCR:   Andrey Cota. Diona Foley, PharmD, BCPS Clinical Pharmacist Pager [phone] 10/27/2015 10:24 AM  Pharmacy Code Sepsis Protocol  Time of code sepsis page: 1030 [x]  Antibiotics delivered at 1035 []  Antibiotics administered prior to code at  (if checked, omit next 2 questions)  Were antibiotics ordered at the time of the code sepsis page? Yes Was it required to contact the physician? [x]  Physician not contacted []  Physician contacted to order antibiotics for code sepsis []  Physician contacted to recommend changing antibiotics  Pharmacy consulted for: levaquin and vancomycin  Anti-infectives    Start     Dose/Rate Route Frequency Ordered Stop   10/27/15 1030  levofloxacin (LEVAQUIN) IVPB 750 mg   Status:  Discontinued     750 mg 100 mL/hr over 90 Minutes Intravenous  Once 10/27/15 1023 10/27/15 1027   10/27/15 1030  aztreonam (AZACTAM) 2 g in dextrose 5 % 50 mL IVPB  Status:  Discontinued     2 g 100 mL/hr over 30 Minutes Intravenous  Once 10/27/15 1023 10/27/15 1027   10/27/15 1030  vancomycin (VANCOCIN) IVPB 1000 mg/200 mL premix  Status:  Discontinued     1,000 mg 200 mL/hr over 60 Minutes Intravenous  Once 10/27/15 1023 10/27/15 1027   10/27/15 1030  levofloxacin (LEVAQUIN) IVPB 750 mg     750 mg 100 mL/hr over 90 Minutes Intravenous  Once 10/27/15 1028     10/27/15 1030  vancomycin (VANCOCIN) IVPB 1000 mg/200 mL premix     1,000 mg 200 mL/hr over 60 Minutes Intravenous  Once 10/27/15 1028          Nurse education provided: [x]  Minutes left to administer antibiotics to achieve 1 hour goal [x]  Correct order of antibiotic administration [x]  Antibiotic Y-site compatibilities     Rebecka Apley, PharmD 10/27/2015, 10:31 AM

## 2015-10-27 NOTE — Telephone Encounter (Signed)
Tried to phone patient, N/A, left message on home phone and cell phone.

## 2015-10-27 NOTE — ED Provider Notes (Addendum)
CSN: [id number]     Arrival date & time 10/27/15  [id number] History   First MD Initiated Contact with Patient 10/27/15 1004     Chief Complaint  Patient presents with  . Fever  . Emesis  . Generalized Body Aches     (Consider location/radiation/quality/duration/timing/severity/associated sxs/prior Treatment) HPI Patient reports that she got very sick over the past 3 days. Started with intense body aches on Saturday. She reports by the next day she was having cough with nausea, sweats and fevers. She reports generalized headache. Fever has been up to 102. Patient has had 2 episodes of vomiting today. She reports a lot of gastrointestinal gas but no diarrhea. No localizing abdominal pain. Patient was influenza tested by her PCP yesterday and found to be negative. They did still feel this was likely a influenza and patient started Tamiflu yesterday. Patient poor she feels much worse today. She feels like all of her bones are "broken" and she has chest pain with coughing. She reports feeling extremely weak and fatigued. Patient also reports that she finished doxycycline approximately 2 weeks ago. She has had the "crud" since the end of December. The doxycycline was for sinusitis. Past Medical History  Diagnosis Date  . Anxiety and depression   . Diabetes mellitus   . GERD (gastroesophageal reflux disease)   . Allergy   . Hypertension   . Hyperlipidemia   . Hypothyroidism   . History of chicken pox   . Migraines   . Urinary incontinence   . UTI (lower urinary tract infection)     recurrent  . Arthritis     L shoulder  . Iritis     per [facility]  . Diverticulosis   . Fatty liver   . Anemia   . Fibromyalgia   . Heart murmur     states dx 20 yr ago-very mild-n0 echo  . Snores   . Wears glasses    Past Surgical History  Procedure Laterality Date  . Abdominal hysterectomy  1992    Partial  . Bladder/pelvic tack  2001  . Breast biopsy  1964    benign tumor, right  . Tubal  ligation    . Bladder polpectomy      x 3  . Rotator cuff repair Left   . Rotator cuff repair Right    Family History  Problem Relation Age of Onset  . Arthritis Maternal Grandmother   . Stroke Maternal Grandmother   . Diabetes Maternal Grandmother   . Heart disease Paternal Uncle     x 2  . Arthritis Sister   . Breast cancer Cousin   . Heart disease Paternal Aunt   . Diabetes Paternal Uncle   . Arthritis Mother   . Heart disease Mother   . Hyperlipidemia Mother   . Hypertension Mother   . Diabetes Mother   . Depression Mother   . Alcohol abuse Father   . Lung cancer Father   . Diabetes Sister   . Heart disease Paternal Uncle     x 5  . Heart disease Paternal Aunt     x 3  . Stroke Paternal Aunt   . Colon cancer Neg Hx    Social History  Substance Use Topics  . Smoking status: Former Smoker -- 2.00 packs/day for 5 years    Types: Cigarettes    Quit date: 09/12/1985  . Smokeless tobacco: Never Used  . Alcohol Use: 0.0 oz/week    0 Standard drinks or  equivalent per week     Comment: social   OB History    No data available     Review of Systems 10 Systems reviewed and are negative for acute change except as noted in the HPI.    Allergies  Cephalexin; Codeine; Inapsine; Lipitor; Niacin and related; Pravastatin; and Adhesive  Home Medications   Prior to Admission medications   Medication Sig Start Date End Date Taking? Authorizing Provider  albuterol (PROVENTIL HFA;VENTOLIN HFA) 108 (90 Base) MCG/ACT inhaler Inhale 1-2 puffs into the lungs every 6 (six) hours as needed for wheezing or shortness of breath. 10/26/15  Yes Tonia Ghent, MD  atenolol (TENORMIN) 50 MG tablet Take 1 tablet (50 mg total) by mouth daily. 04/28/15  Yes Tonia Ghent, MD  Calcium-Vitamin D 600-200 MG-UNIT per tablet Take 1 tablet by mouth 2 (two) times daily.   Yes Historical Provider, MD  cetirizine (ZYRTEC) 10 MG tablet Take 10 mg by mouth as needed.   Yes Historical Provider, MD   citalopram (CELEXA) 20 MG tablet TAKE ONE OR TWO TABLETS PER DAY Patient taking differently: Take 20 mg by mouth 2 (two) times daily. TAKE ONE OR TWO TABLETS PER DAY 04/28/15  Yes Tonia Ghent, MD  lisinopril (PRINIVIL,ZESTRIL) 10 MG tablet Take 1 tablet (10 mg total) by mouth at bedtime. 04/28/15  Yes Tonia Ghent, MD  Multiple Vitamin (MULTIVITAMIN) tablet Take 1 tablet by mouth daily.   Yes Historical Provider, MD  nitrofurantoin (MACRODANTIN) 100 MG capsule Take 100 mg by mouth daily.   Yes Historical Provider, MD  ranitidine (ZANTAC) 150 MG tablet Take 150 mg by mouth daily.    Yes Historical Provider, MD  vitamin B-12 (CYANOCOBALAMIN) 1000 MCG tablet Take 1,000 mcg by mouth daily.   Yes Historical Provider, MD  clonazePAM (KLONOPIN) 0.5 MG tablet Take 0.5-1 tablets (0.25-0.5 mg total) by mouth 2 (two) times daily as needed for anxiety. Patient not taking: Reported on 10/27/2015 02/26/14   Tonia Ghent, MD  fluticasone [facility]) 50 MCG/ACT nasal spray INHALE 2 SPRAYS INTO EACH NOSTRIL EVERY DAY. Patient not taking: Reported on 10/27/2015 02/03/15   Tonia Ghent, MD  hydrocortisone (ANUSOL-HC) 25 MG suppository INSERT 1 SUPPOSITORY RECTALLY TWICE A DAY Patient not taking: Reported on 10/27/2015 01/30/15   Tonia Ghent, MD  methocarbamol (ROBAXIN) 500 MG tablet Take 1 tablet (500 mg total) by mouth every 8 (eight) hours as needed for muscle spasms. Patient not taking: Reported on 10/27/2015 07/19/13   Tonia Ghent, MD   BP 120/75 mmHg  Pulse 97  Temp(Src) 100.3 F (37.9 C) (Oral)  Resp 24  SpO2 92% Physical Exam  Constitutional: She is oriented to person, place, and time.  Patient appears ill and uncomfortable. She is diaphoretic. mental status is clear. Tachypnea. Patient speaking in full sentences.  HENT:  Head: Normocephalic.  Right Ear: External ear normal.  Left Ear: External ear normal.  Nose: Nose normal.  Mouth/Throat: Oropharynx is clear and moist.  Eyes: EOM are  normal. Pupils are equal, round, and reactive to light. Right eye exhibits no discharge. Left eye exhibits no discharge. No scleral icterus.  Neck: Neck supple.  Cardiovascular:  Tachycardia. No rub murmur gallop.  Pulmonary/Chest:  Tachypnea. Harsh cough with deep inspiration. Rales in the right lung base. Good general airflow except for right base.  Abdominal: Soft. Bowel sounds are normal. She exhibits no distension.  Patient has generalized tenderness without any guarding. No localization of pain.  Musculoskeletal: Normal range of motion. She exhibits no edema or tenderness.  Neurological: She is alert and oriented to person, place, and time. No cranial nerve deficit. She exhibits normal muscle tone. Coordination normal.  Skin: Skin is warm. No rash noted.  Diaphoretic.  Psychiatric: She has a normal mood and affect.    ED Course  Procedures (including critical care time) CRITICAL CARE Performed by: Charlesetta Shanks   Total critical care time: 45 minutes  Critical care time was exclusive of separately billable procedures and treating other patients.  Critical care was necessary to treat or prevent imminent or life-threatening deterioration.  Critical care was time spent personally by me on the following activities: development of treatment plan with patient and/or surrogate as well as nursing, discussions with consultants, evaluation of patient's response to treatment, examination of patient, obtaining history from patient or surrogate, ordering and performing treatments and interventions, ordering and review of laboratory studies, ordering and review of radiographic studies, pulse oximetry and re-evaluation of patient's condition. Labs Review Labs Reviewed  COMPREHENSIVE METABOLIC PANEL - Abnormal; Notable for the following:    Chloride 100 (*)    CO2 21 (*)    Glucose, Bld 232 (*)    Total Bilirubin 1.4 (*)    GFR calc non Af Amer 59 (*)    All other components within normal  limits  CBC WITH DIFFERENTIAL/PLATELET - Abnormal; Notable for the following:    WBC 11.6 (*)    Neutro Abs 9.1 (*)    All other components within normal limits  I-STAT CG4 LACTIC ACID, ED - Abnormal; Notable for the following:    Lactic Acid, Venous 3.16 (*)    All other components within normal limits  CULTURE, BLOOD (ROUTINE X 2)  CULTURE, BLOOD (ROUTINE X 2)  URINE CULTURE  URINALYSIS, ROUTINE W REFLEX MICROSCOPIC (NOT AT [facility])  INFLUENZA PANEL BY PCR (TYPE A & B, H1N1)  I-STAT CG4 LACTIC ACID, ED  I-STAT CG4 LACTIC ACID, ED    Imaging Review Dg Chest 2 View  10/27/2015  CLINICAL DATA:  Fever, dry cough, body aches, decreased appetite for 3 days, hypertension, diabetes mellitus, GERD EXAM: CHEST  2 VIEW COMPARISON:  None FINDINGS: Normal heart size, mediastinal contours, and pulmonary vascularity. RIGHT lower lobe infiltrate most likely representing pneumonia. Underlying bronchitic changes. Remaining lungs grossly clear. No pleural effusion or pneumothorax. Bones demineralized. IMPRESSION: RIGHT lobe opacity most likely representing pneumonia. Followup PA and lateral chest X-ray is recommended in 3-4 weeks following trial of antibiotic therapy to ensure resolution and exclude underlying malignancy. Electronically Signed   By: Lavonia Dana M.D.   On: 10/27/2015 10:33   I have personally reviewed and evaluated these images and lab results as part of my medical decision-making.   EKG Interpretation   Date/Time:  Tuesday October 27 2015 11:53:25 EST Ventricular Rate:  93 PR Interval:  166 QRS Duration: 83 QT Interval:  346 QTC Calculation: 430 R Axis:   46 Text Interpretation:  Sinus rhythm normal. no change Confirmed by  Johnney Killian, MD, Jeannie Done [phone]) on 10/27/2015 12:07:51 PM     Consult: Hospitalist for admission. Recheck: Patient condition appears improved after hydration and education administration. Respiratory status is stable. Mental status is clear. No hypotension. MDM    Final diagnoses:  Sepsis, due to unspecified organism [facility])  Community acquired pneumonia   Patient presents with fever, tachycardia, tachypnea and elevated lactic acid. The patient has had URI type symptoms for several weeks. She acutely became more ill  on Saturday. She does have cough and generalized body ache. At this point, pneumonia is identified on chest x-ray. Sepsis treatment is initiated for community-acquired pneumonia. Patient did initiate empiric influenza treatment yesterday by PCP. Although she did test negative yesterday, symptoms are also suggestive of a complicated influenza with secondary bacterial infection. Repeat influenza testing obtained. Plan is for admission.    Charlesetta Shanks, MD 10/27/15 1125  Charlesetta Shanks, MD 10/27/15 1209

## 2015-10-28 DIAGNOSIS — I10 Essential (primary) hypertension: Secondary | ICD-10-CM

## 2015-10-28 DIAGNOSIS — A419 Sepsis, unspecified organism: Principal | ICD-10-CM

## 2015-10-28 DIAGNOSIS — J189 Pneumonia, unspecified organism: Secondary | ICD-10-CM

## 2015-10-28 LAB — CBC
HCT: 36.7 % (ref 36.0–46.0)
Hemoglobin: 11.8 g/dL — ABNORMAL LOW (ref 12.0–15.0)
MCH: 29.2 pg (ref 26.0–34.0)
MCHC: 32.2 g/dL (ref 30.0–36.0)
MCV: 90.8 fL (ref 78.0–100.0)
Platelets: 171 10*3/uL (ref 150–400)
RBC: 4.04 MIL/uL (ref 3.87–5.11)
RDW: 14.6 % (ref 11.5–15.5)
WBC: 9.5 10*3/uL (ref 4.0–10.5)

## 2015-10-28 LAB — GLUCOSE, CAPILLARY
Glucose-Capillary: 131 mg/dL — ABNORMAL HIGH (ref 65–99)
Glucose-Capillary: 125 mg/dL — ABNORMAL HIGH (ref 65–99)
Glucose-Capillary: 178 mg/dL — ABNORMAL HIGH (ref 65–99)
Glucose-Capillary: 87 mg/dL (ref 65–99)

## 2015-10-28 LAB — HEMOGLOBIN A1C
Hgb A1c MFr Bld: 7.2 % — ABNORMAL HIGH (ref 4.8–5.6)
Mean Plasma Glucose: 160 mg/dL

## 2015-10-28 LAB — URINE CULTURE

## 2015-10-28 LAB — COMPREHENSIVE METABOLIC PANEL
ALT: 17 U/L (ref 14–54)
AST: 21 U/L (ref 15–41)
Albumin: 3.1 g/dL — ABNORMAL LOW (ref 3.5–5.0)
Alkaline Phosphatase: 68 U/L (ref 38–126)
Anion gap: 9 (ref 5–15)
BUN: 9 mg/dL (ref 6–20)
CO2: 22 mmol/L (ref 22–32)
Calcium: 8.6 mg/dL — ABNORMAL LOW (ref 8.9–10.3)
Chloride: 105 mmol/L (ref 101–111)
Creatinine, Ser: 0.85 mg/dL (ref 0.44–1.00)
GFR calc Af Amer: >60 mL/min (ref >60)
GFR calc non Af Amer: >60 mL/min (ref >60)
Glucose, Bld: 138 mg/dL — ABNORMAL HIGH (ref 65–99)
Potassium: 3.8 mmol/L (ref 3.5–5.1)
Sodium: 136 mmol/L (ref 135–145)
Total Bilirubin: 0.9 mg/dL (ref 0.3–1.2)
Total Protein: 6.6 g/dL (ref 6.5–8.1)

## 2015-10-28 LAB — LEGIONELLA ANTIGEN, URINE

## 2015-10-28 LAB — HIV ANTIBODY (ROUTINE TESTING W REFLEX): HIV Screen 4th Generation wRfx: NONREACTIVE

## 2015-10-28 MED ORDER — IBUPROFEN 600 MG PO TABS
600.0000 mg | ORAL_TABLET | Freq: Four times a day (QID) | ORAL | Status: DC | PRN
  Filled 2015-10-28: qty 1

## 2015-10-28 MED ORDER — KETOROLAC TROMETHAMINE 30 MG/ML IJ SOLN
15.0000 mg | Freq: Once | INTRAMUSCULAR | Status: AC
  Administered 2015-10-28: 15 mg via INTRAVENOUS
  Filled 2015-10-28: qty 1

## 2015-10-28 MED ORDER — DIPHENHYDRAMINE HCL 50 MG/ML IJ SOLN
25.0000 mg | Freq: Once | INTRAMUSCULAR | Status: AC
  Administered 2015-10-28: 25 mg via INTRAVENOUS
  Filled 2015-10-28: qty 1

## 2015-10-28 MED ORDER — GUAIFENESIN-DM 100-10 MG/5ML PO SYRP
5.0000 mL | ORAL_SOLUTION | ORAL | Status: DC | PRN
  Administered 2015-10-28 – 2015-10-29 (×2): 5 mL via ORAL
  Filled 2015-10-28 (×2): qty 5

## 2015-10-28 MED ORDER — DIPHENHYDRAMINE HCL 50 MG/ML IJ SOLN
12.5000 mg | Freq: Once | INTRAMUSCULAR | Status: AC
  Administered 2015-10-28: 12.5 mg via INTRAVENOUS
  Filled 2015-10-28: qty 1

## 2015-10-28 MED ORDER — SODIUM CHLORIDE 0.9 % IV BOLUS (SEPSIS)
500.0000 mL | Freq: Once | INTRAVENOUS | Status: AC
  Administered 2015-10-28: 500 mL via INTRAVENOUS

## 2015-10-28 MED ORDER — TRAMADOL HCL 50 MG PO TABS
50.0000 mg | ORAL_TABLET | Freq: Once | ORAL | Status: AC
  Administered 2015-10-28: 50 mg via ORAL
  Filled 2015-10-28: qty 1

## 2015-10-28 NOTE — Progress Notes (Signed)
PROGRESS NOTE  Allison Yoder [id number] DOB: [birth year] DOA: 10/27/2015 PCP: Elsie Stain, MD Brief History 65 year old female with history of hypertension, fibromyalgia, hepatic steatosis presented with 2 day history of worsening coughing, myalgias and arthralgias, fevers, and associated nausea and vomiting. The patient states that she has had URI type symptoms since December 2016. She had been treated with 2 courses of doxycycline during which she had some transient improvement. On 10/26/2015, the patient had a fever of 104.6F at home with the above constellation of symptoms which pondered her presentation to the emergency department. Lactic acid peaked at 3.16. The patient was started on vancomycin and levofloxacin. Assessment/Plan: Sepsis -Present the time of admission -Secondary to pneumonia -Lactic acid 3.16 -Continue levofloxacin and vancomycin pending culture data -Urine legionella antigen, urine Streptococcus pneumoniae antigen negative Community-acquired pneumonia -Chest x-ray revealed right lower lobe opacity -Influenza PCR negative -Viral respiratory panel -Continue antibiotics pending culture data -?aspiration with vomiting -CT of chest if fever persists Hypertension -Continue atenolol and lisinopril -Controlled Depression -Continue Celexa  Family Communication:   Husband updated at beside Disposition Plan:   Home 2-3 days       Procedures/Studies: Dg Chest 2 View  10/27/2015  CLINICAL DATA:  Fever, dry cough, body aches, decreased appetite for 3 days, hypertension, diabetes mellitus, GERD EXAM: CHEST  2 VIEW COMPARISON:  None FINDINGS: Normal heart size, mediastinal contours, and pulmonary vascularity. RIGHT lower lobe infiltrate most likely representing pneumonia. Underlying bronchitic changes. Remaining lungs grossly clear. No pleural effusion or pneumothorax. Bones demineralized. IMPRESSION: RIGHT lobe opacity most likely representing pneumonia.  Followup PA and lateral chest X-ray is recommended in 3-4 weeks following trial of antibiotic therapy to ensure resolution and exclude underlying malignancy. Electronically Signed   By: Lavonia Dana M.D.   On: 10/27/2015 10:33         Subjective: Patient continues to complain of nonproductive cough. Denies any chest pain, nausea, vomiting, diarrhea, abdominal pain. Denies any rashes. Complains of bifrontal headache.  Objective: Filed Vitals:   10/28/15 0416 10/28/15 0900 10/28/15 1207 10/28/15 1654  BP: 129/68 123/62  129/61  Pulse: 94 77  84  Temp: 103.1 F (39.5 C) 98.9 F (37.2 C) 103.2 F (39.6 C) 101.2 F (38.4 C)  TempSrc: Oral Oral Oral Oral  Resp: 20 16  16   Weight:      SpO2: 94% 95%  94%    Intake/Output Summary (Last 24 hours) at 10/28/15 1705 Last data filed at 10/28/15 1500  Gross per 24 hour  Intake    590 ml  Output    200 ml  Net    390 ml   Weight change:  Exam:   General:  Pt is alert, follows commands appropriately, not in acute distress  HEENT: No icterus, No thrush, No neck mass, /AT  Cardiovascular: RRR, S1/S2, no rubs, no gallops  Respiratory: Bibasilar crackles. No wheezing. Good air movement.  Abdomen: Soft/+BS, non tender, non distended, no guarding; no hepatosplenomegaly  Extremities: No edema, No lymphangitis, No petechiae, No rashes, no synovitis  Data Reviewed: Basic Metabolic Panel:  Recent Labs Lab 10/27/15 0950 10/28/15 0744  NA 136 136  K 4.1 3.8  CL 100* 105  CO2 21* 22  GLUCOSE 232* 138*  BUN 10 9  CREATININE 0.99 0.85  CALCIUM 9.5 8.6*   Liver Function Tests:  Recent Labs Lab 10/27/15 0950 10/28/15 0744  AST 23 21  ALT 22 17  ALKPHOS 91  68  BILITOT 1.4* 0.9  PROT 7.5 6.6  ALBUMIN 3.6 3.1*   No results for input(s): LIPASE, AMYLASE in the last 168 hours. No results for input(s): AMMONIA in the last 168 hours. CBC:  Recent Labs Lab 10/27/15 0950 10/28/15 0744  WBC 11.6* 9.5  NEUTROABS 9.1*  --    HGB 14.6 11.8*  HCT 45.1 36.7  MCV 92.0 90.8  PLT 182 171   Cardiac Enzymes: No results for input(s): CKTOTAL, CKMB, CKMBINDEX, TROPONINI in the last 168 hours. BNP: Invalid input(s): POCBNP CBG:  Recent Labs Lab 10/27/15 1646 10/27/15 2130 10/28/15 0801 10/28/15 1141 10/28/15 1652  GLUCAP 68 123* 131* 125* 178*    Recent Results (from the past 240 hour(s))  Culture, blood (routine x 2)     Status: None (Preliminary result)   Collection Time: 10/27/15  9:50 AM  Result Value Ref Range Status   Specimen Description BLOOD LEFT HAND  Final   Special Requests BOTTLES DRAWN AEROBIC AND ANAEROBIC 5MLS  Final   Culture NO GROWTH 1 DAY  Final   Report Status PENDING  Incomplete  Culture, blood (routine x 2)     Status: None (Preliminary result)   Collection Time: 10/27/15  9:50 AM  Result Value Ref Range Status   Specimen Description BLOOD RIGHT ANTECUBITAL  Final   Special Requests BOTTLES DRAWN AEROBIC AND ANAEROBIC 5MLS  Final   Culture NO GROWTH 1 DAY  Final   Report Status PENDING  Incomplete  Urine culture     Status: None   Collection Time: 10/27/15 11:28 AM  Result Value Ref Range Status   Specimen Description URINE, CLEAN CATCH  Final   Special Requests NONE  Final   Culture MULTIPLE SPECIES PRESENT, SUGGEST RECOLLECTION  Final   Report Status 10/28/2015 FINAL  Final     Scheduled Meds: . atenolol  50 mg Oral Daily  . citalopram  20 mg Oral BID  . enoxaparin (LOVENOX) injection  40 mg Subcutaneous Q24H  . guaiFENesin  1,200 mg Oral BID  . insulin aspart  0-5 Units Subcutaneous QHS  . insulin aspart  0-9 Units Subcutaneous TID WC  . levofloxacin (LEVAQUIN) IV  750 mg Intravenous Q24H  . lisinopril  10 mg Oral QHS  . loratadine  10 mg Oral Daily  . vancomycin  1,000 mg Intravenous Q12H   Continuous Infusions:    Ellin Fitzgibbons, DO  [facility] Pager [phone]  If 7PM-7AM, please contact night-coverage [email or web address] Password [email or web address] 10/28/2015, 5:05  PM   LOS: 1 day

## 2015-10-29 ENCOUNTER — Inpatient Hospital Stay (HOSPITAL_COMMUNITY): Payer: Medicare Other

## 2015-10-29 LAB — CBC
HCT: 35.6 % — ABNORMAL LOW (ref 36.0–46.0)
Hemoglobin: 11.5 g/dL — ABNORMAL LOW (ref 12.0–15.0)
MCH: 29.2 pg (ref 26.0–34.0)
MCHC: 32.3 g/dL (ref 30.0–36.0)
MCV: 90.4 fL (ref 78.0–100.0)
Platelets: 161 10*3/uL (ref 150–400)
RBC: 3.94 MIL/uL (ref 3.87–5.11)
RDW: 14.6 % (ref 11.5–15.5)
WBC: 8.5 10*3/uL (ref 4.0–10.5)

## 2015-10-29 LAB — GLUCOSE, CAPILLARY
Glucose-Capillary: 147 mg/dL — ABNORMAL HIGH (ref 65–99)
Glucose-Capillary: 139 mg/dL — ABNORMAL HIGH (ref 65–99)
Glucose-Capillary: 111 mg/dL — ABNORMAL HIGH (ref 65–99)
Glucose-Capillary: 153 mg/dL — ABNORMAL HIGH (ref 65–99)

## 2015-10-29 LAB — BASIC METABOLIC PANEL
Anion gap: 12 (ref 5–15)
BUN: 12 mg/dL (ref 6–20)
CO2: 22 mmol/L (ref 22–32)
Calcium: 8.9 mg/dL (ref 8.9–10.3)
Chloride: 104 mmol/L (ref 101–111)
Creatinine, Ser: 0.83 mg/dL (ref 0.44–1.00)
GFR calc Af Amer: >60 mL/min (ref >60)
GFR calc non Af Amer: >60 mL/min (ref >60)
Glucose, Bld: 148 mg/dL — ABNORMAL HIGH (ref 65–99)
Potassium: 4 mmol/L (ref 3.5–5.1)
Sodium: 138 mmol/L (ref 135–145)

## 2015-10-29 MED ORDER — OXYCODONE HCL 5 MG PO TABS
5.0000 mg | ORAL_TABLET | ORAL | Status: DC | PRN
  Administered 2015-10-29 – 2015-10-31 (×9): 5 mg via ORAL
  Filled 2015-10-29 (×9): qty 1

## 2015-10-29 MED ORDER — DIPHENHYDRAMINE HCL 50 MG/ML IJ SOLN
25.0000 mg | Freq: Once | INTRAMUSCULAR | Status: AC
  Administered 2015-10-29: 25 mg via INTRAVENOUS
  Filled 2015-10-29: qty 1

## 2015-10-29 MED ORDER — TRAMADOL HCL 50 MG PO TABS
50.0000 mg | ORAL_TABLET | Freq: Four times a day (QID) | ORAL | Status: DC | PRN
  Administered 2015-10-29: 50 mg via ORAL
  Filled 2015-10-29: qty 1

## 2015-10-29 MED ORDER — SODIUM CHLORIDE 0.9 % IV SOLN
INTRAVENOUS | Status: DC
  Administered 2015-10-29 (×2): via INTRAVENOUS
  Filled 2015-10-29: qty 1000

## 2015-10-29 MED ORDER — KETOROLAC TROMETHAMINE 15 MG/ML IJ SOLN
15.0000 mg | Freq: Once | INTRAMUSCULAR | Status: AC
  Administered 2015-10-29: 15 mg via INTRAVENOUS
  Filled 2015-10-29: qty 1

## 2015-10-29 MED ORDER — METOCLOPRAMIDE HCL 5 MG/ML IJ SOLN
10.0000 mg | Freq: Once | INTRAMUSCULAR | Status: AC
  Administered 2015-10-29: 10 mg via INTRAVENOUS
  Filled 2015-10-29: qty 2

## 2015-10-29 NOTE — Progress Notes (Signed)
Patient has temp of 101,MD on call notified.No new orders received.Will continue to monitor. Aretta Stetzel, Wonda Cheng, Therapist, sports

## 2015-10-29 NOTE — Progress Notes (Signed)
PROGRESS NOTE  Allison Yoder [id number] DOB: 05/26/1951 DOA: 10/27/2015 PCP: Allison Stain, MD  Brief History 65 year old female with history of hypertension, fibromyalgia, hepatic steatosis presented with 2 day history of worsening coughing, myalgias and arthralgias, fevers, and associated nausea and vomiting. The patient states that she has had URI type symptoms since December 2016. She had been treated with 2 courses of doxycycline during which she had some transient improvement. On 10/26/2015, the patient had a fever of 104.55F at home with the above constellation of symptoms which pondered her presentation to the emergency department. Lactic acid peaked at 3.16. The patient was started on vancomycin and levofloxacin. Assessment/Plan: Sepsis -Present the time of admission -Secondary to pneumonia -Lactic acid 3.16 -Continue levofloxacin and vancomycin -blood cultures neg to date -Urine legionella antigen, urine Streptococcus pneumoniae antigen negative -Restart IV fluids Community-acquired pneumonia -Chest x-ray revealed right lower lobe opacity -Influenza PCR negative -Viral respiratory panel--pending -Continue antibiotics pending culture data -?aspiration with vomiting -CT of chest  Hypertension -Continue atenolol and lisinopril -Controlled Depression -Continue Celexa Headache -likely toxic headache due to fever, infection -doubt CNS infection -symptomatic treatment  Family Communication: Husband updated at beside Disposition Plan: Home 2-3 days    Procedures/Studies: Dg Chest 2 View  10/27/2015  CLINICAL DATA:  Fever, dry cough, body aches, decreased appetite for 3 days, hypertension, diabetes mellitus, GERD EXAM: CHEST  2 VIEW COMPARISON:  None FINDINGS: Normal heart size, mediastinal contours, and pulmonary vascularity. RIGHT lower lobe infiltrate most likely representing pneumonia. Underlying bronchitic changes. Remaining lungs grossly clear. No  pleural effusion or pneumothorax. Bones demineralized. IMPRESSION: RIGHT lobe opacity most likely representing pneumonia. Followup PA and lateral chest X-ray is recommended in 3-4 weeks following trial of antibiotic therapy to ensure resolution and exclude underlying malignancy. Electronically Signed   By: Allison Yoder M.D.   On: 10/27/2015 10:33         Subjective: Patient is still having fevers and chills and coughing yellow sputum. Denies any hemoptysis. Denies any chest pain, vomiting, diarrhea, abdominal pain, dysuria, hematuria, rashes, synovitis.  Objective: Filed Vitals:   10/28/15 2142 10/29/15 0447 10/29/15 0812 10/29/15 1712  BP: 114/85 114/88 158/89 138/69  Pulse: 77 88 72 81  Temp: 99.6 F (37.6 C) 101.3 F (38.5 C) 99 F (37.2 C) 101.7 F (38.7 C)  TempSrc: Oral Oral Oral Oral  Resp: 16 16 16 16   Weight: 83.008 kg (183 lb)     SpO2: 99% 98% 96% 94%    Intake/Output Summary (Last 24 hours) at 10/29/15 1734 Last data filed at 10/29/15 1300  Gross per 24 hour  Intake   1000 ml  Output   1100 ml  Net   -100 ml   Weight change: 0.608 kg (1 lb 5.5 oz) Exam:   General:  Pt is alert, follows commands appropriately, not in acute distress  HEENT: No icterus, No thrush, No neck mass, [place]/AT  Cardiovascular: RRR, S1/S2, no rubs, no gallops  Respiratory: Bibasilar crackles. Left clear to auscultation. No wheezing.  Abdomen: Soft/+BS, non tender, non distended, no guarding; no hepatosplenomegaly  Extremities: No edema, No lymphangitis, No petechiae, No rashes, no synovitis  Data Reviewed: Basic Metabolic Panel:  Recent Labs Lab 10/27/15 0950 10/28/15 0744 10/29/15 0610  NA 136 136 138  K 4.1 3.8 4.0  CL 100* 105 104  CO2 21* 22 22  GLUCOSE 232* 138* 148*  BUN 10 9 12   CREATININE 0.99 0.85 0.83  CALCIUM 9.5 8.6* 8.9   Liver Function Tests:  Recent Labs Lab 10/27/15 0950 10/28/15 0744  AST 23 21  ALT 22 17  ALKPHOS 91 68  BILITOT 1.4* 0.9  PROT  7.5 6.6  ALBUMIN 3.6 3.1*   No results for input(s): LIPASE, AMYLASE in the last 168 hours. No results for input(s): AMMONIA in the last 168 hours. CBC:  Recent Labs Lab 10/27/15 0950 10/28/15 0744 10/29/15 0610  WBC 11.6* 9.5 8.5  NEUTROABS 9.1*  --   --   HGB 14.6 11.8* 11.5*  HCT 45.1 36.7 35.6*  MCV 92.0 90.8 90.4  PLT 182 171 161   Cardiac Enzymes: No results for input(s): CKTOTAL, CKMB, CKMBINDEX, TROPONINI in the last 168 hours. BNP: Invalid input(s): POCBNP CBG:  Recent Labs Lab 10/28/15 1141 10/28/15 1652 10/28/15 2146 10/29/15 0812 10/29/15 1149  GLUCAP 125* 178* 87 147* 139*    Recent Results (from the past 240 hour(s))  Culture, blood (routine x 2)     Status: None (Preliminary result)   Collection Time: 10/27/15  9:50 AM  Result Value Ref Range Status   Specimen Description BLOOD LEFT HAND  Final   Special Requests BOTTLES DRAWN AEROBIC AND ANAEROBIC 5MLS  Final   Culture NO GROWTH 2 DAYS  Final   Report Status PENDING  Incomplete  Culture, blood (routine x 2)     Status: None (Preliminary result)   Collection Time: 10/27/15  9:50 AM  Result Value Ref Range Status   Specimen Description BLOOD RIGHT ANTECUBITAL  Final   Special Requests BOTTLES DRAWN AEROBIC AND ANAEROBIC 5MLS  Final   Culture NO GROWTH 2 DAYS  Final   Report Status PENDING  Incomplete  Urine culture     Status: None   Collection Time: 10/27/15 11:28 AM  Result Value Ref Range Status   Specimen Description URINE, CLEAN CATCH  Final   Special Requests NONE  Final   Culture MULTIPLE SPECIES PRESENT, SUGGEST RECOLLECTION  Final   Report Status 10/28/2015 FINAL  Final     Scheduled Meds: . atenolol  50 mg Oral Daily  . citalopram  20 mg Oral BID  . enoxaparin (LOVENOX) injection  40 mg Subcutaneous Q24H  . guaiFENesin  1,200 mg Oral BID  . insulin aspart  0-5 Units Subcutaneous QHS  . insulin aspart  0-9 Units Subcutaneous TID WC  . levofloxacin (LEVAQUIN) IV  750 mg  Intravenous Q24H  . lisinopril  10 mg Oral QHS  . loratadine  10 mg Oral Daily  . vancomycin  1,000 mg Intravenous Q12H   Continuous Infusions: . sodium chloride 0.9 % 1,000 mL infusion       Allison Goehring, DO  [facility] Pager [phone]  If 7PM-7AM, please contact night-coverage [email or web address] Password [facility] 10/29/2015, 5:34 PM   LOS: 2 days

## 2015-10-30 ENCOUNTER — Encounter (HOSPITAL_COMMUNITY): Payer: Self-pay | Admitting: Radiology

## 2015-10-30 DIAGNOSIS — R109 Unspecified abdominal pain: Secondary | ICD-10-CM | POA: Insufficient documentation

## 2015-10-30 DIAGNOSIS — R509 Fever, unspecified: Secondary | ICD-10-CM

## 2015-10-30 DIAGNOSIS — R1084 Generalized abdominal pain: Secondary | ICD-10-CM

## 2015-10-30 LAB — MRSA PCR SCREENING: MRSA by PCR: NEGATIVE

## 2015-10-30 LAB — CBC WITH DIFFERENTIAL/PLATELET
Basophils Absolute: 0 10*3/uL (ref 0.0–0.1)
Basophils Relative: 0 %
Eosinophils Absolute: 0.1 10*3/uL (ref 0.0–0.7)
Eosinophils Relative: 1 %
HCT: 35.9 % — ABNORMAL LOW (ref 36.0–46.0)
Hemoglobin: 12.1 g/dL (ref 12.0–15.0)
Lymphocytes Relative: 17 %
Lymphs Abs: 1.5 10*3/uL (ref 0.7–4.0)
MCH: 30.3 pg (ref 26.0–34.0)
MCHC: 33.7 g/dL (ref 30.0–36.0)
MCV: 89.8 fL (ref 78.0–100.0)
Monocytes Absolute: 1 10*3/uL (ref 0.1–1.0)
Monocytes Relative: 12 %
Neutro Abs: 5.8 10*3/uL (ref 1.7–7.7)
Neutrophils Relative %: 70 %
Platelets: 168 10*3/uL (ref 150–400)
RBC: 4 MIL/uL (ref 3.87–5.11)
RDW: 14.8 % (ref 11.5–15.5)
WBC: 8.4 10*3/uL (ref 4.0–10.5)

## 2015-10-30 LAB — BASIC METABOLIC PANEL
Anion gap: 9 (ref 5–15)
BUN: 10 mg/dL (ref 6–20)
CO2: 24 mmol/L (ref 22–32)
Calcium: 8.5 mg/dL — ABNORMAL LOW (ref 8.9–10.3)
Chloride: 106 mmol/L (ref 101–111)
Creatinine, Ser: 0.72 mg/dL (ref 0.44–1.00)
GFR calc Af Amer: >60 mL/min (ref >60)
GFR calc non Af Amer: >60 mL/min (ref >60)
Glucose, Bld: 145 mg/dL — ABNORMAL HIGH (ref 65–99)
Potassium: 3.7 mmol/L (ref 3.5–5.1)
Sodium: 139 mmol/L (ref 135–145)

## 2015-10-30 LAB — GLUCOSE, CAPILLARY
Glucose-Capillary: 144 mg/dL — ABNORMAL HIGH (ref 65–99)
Glucose-Capillary: 141 mg/dL — ABNORMAL HIGH (ref 65–99)
Glucose-Capillary: 112 mg/dL — ABNORMAL HIGH (ref 65–99)
Glucose-Capillary: 217 mg/dL — ABNORMAL HIGH (ref 65–99)

## 2015-10-30 LAB — RESPIRATORY VIRUS PANEL
Adenovirus: NEGATIVE
Influenza A: NEGATIVE
Influenza B: NEGATIVE
Metapneumovirus: NEGATIVE
Parainfluenza 1: NEGATIVE
Parainfluenza 2: NEGATIVE
Parainfluenza 3: NEGATIVE
Respiratory Syncytial Virus A: NEGATIVE
Respiratory Syncytial Virus B: NEGATIVE
Rhinovirus: NEGATIVE

## 2015-10-30 LAB — SEDIMENTATION RATE: Sed Rate: 108 mm/hr — ABNORMAL HIGH (ref 0–22)

## 2015-10-30 LAB — C-REACTIVE PROTEIN: CRP: 26.6 mg/dL — ABNORMAL HIGH (ref <1.0)

## 2015-10-30 LAB — LACTIC ACID, PLASMA: Lactic Acid, Venous: 0.9 mmol/L (ref 0.5–2.0)

## 2015-10-30 MED ORDER — IOHEXOL 300 MG/ML  SOLN
75.0000 mL | Freq: Once | INTRAMUSCULAR | Status: AC | PRN
  Administered 2015-10-30: 75 mL via INTRAVENOUS

## 2015-10-30 NOTE — Progress Notes (Signed)
Pharmacy Antibiotic Note  Allison Yoder is a 65 y.o. female admitted on 10/27/2015 with cough, fever and general malaise.  Pharmacy has been consulted for Vancomycin and Levaquin dosing for CAP and sepsis coverage.   Day # 4 Vanc and Levaquin. Tmax 103.1, WBC down to 8.4.  ESR up at 108, CRP up at 2.6., LA down to 0.9. Cultures negative to date. RSV pending.  Plan:  Continue Vancomycin 1gm IV q12hrs.  Continue Levaquin 750 mg IV q24hrs.  Will check vanc trough level prior to 11am dose on 10/31/15.  Target Vanc troughs 15-20 mcg/ml  Height: 67 inches Weight: 182 lb (82.555 kg)  Temp (24hrs), Avg:101.1 F (38.4 C), Min:98.8 F (37.1 C), Max:103 F (39.4 C)   Recent Labs Lab 10/27/15 0950 10/27/15 1000 10/27/15 1300 10/28/15 0744 10/29/15 0610 10/30/15 0835  WBC 11.6*  --   --  9.5 8.5 8.4  CREATININE 0.99  --   --  0.85 0.83 0.72  LATICACIDVEN  --  3.16* 2.48*  --   --  0.9    Estimated Creatinine Clearance: 77.5 mL/min (by C-G formula based on Cr of 0.72).    Allergies  Allergen Reactions  . Cephalexin Itching  . Codeine Itching  . Inapsine [Droperidol] Shortness Of Breath, Anxiety and Hypertension    Elevated HR and BP, panic attack  . Lipitor [Atorvastatin Calcium]     Myalgias   . Niacin And Related     Flushing  . Pravastatin     Myalgias  . Adhesive [Tape] Rash    Blisters with tape    Antimicrobials this admission: Levaquin IV 2/14 >>  Vancomycin 2/14 >>  Microbiology results: 2/14 Blood x 2 - ng x 2 days so far 2/14 Urine - multiple species 2/15 RSV - pending 2/15 Strep and Legionella antigens neg 2/-- sputum - no sample yet   Arty Baumgartner, [place] Pager: [phone] 10/30/2015 3:33 PM

## 2015-10-30 NOTE — Progress Notes (Signed)
10/30/2015 4:25 PM  Temperature orally 102.3.  Charge RN administered Tylenol per prn order, as well as oxycodone for a headache.  Dr. Carles Collet notified.  Will recheck temperature shortly.   [name]

## 2015-10-30 NOTE — Progress Notes (Signed)
10/30/2015 6:25 PM  Temperature has now decreased to 101.6 orally.  Dr. Carles Collet notified.  Will continue to monitor patient. [name]

## 2015-10-30 NOTE — Progress Notes (Signed)
PROGRESS NOTE  JAPJI KOK MGN:[id number] DOB: 18-Sep-1950 DOA: 10/27/2015 PCP: Elsie Stain, MD  Brief History 65 year old female with history of hypertension, fibromyalgia, hepatic steatosis presented with 2 day history of worsening coughing, myalgias and arthralgias, fevers, and associated nausea and vomiting. The patient states that she has had URI type symptoms since December 2016. She had been treated with 2 courses of doxycycline during which she had some transient improvement. On 10/26/2015, the patient had a fever of 104.58F at home with the above constellation of symptoms which pondered her presentation to the emergency department. Lactic acid peaked at 3.16. The patient was started on vancomycin and levofloxacin. Assessment/Plan: Sepsis -Present the time of admission -Secondary to pneumonia -Lactic acid 3.16-->0.9 -Continue levofloxacin and vancomycin -blood cultures neg to date -Urine legionella antigen, urine Streptococcus pneumoniae antigen negative -Restart IV fluids -Due to persistent fever or any abdominal pain, obtain CT abdomen and pelvis -ESR 108 -CRP 26.0 Community-acquired pneumonia -Chest x-ray revealed right lower lobe opacity -Influenza PCR negative -Viral respiratory panel--pending -Continue antibiotics pending culture data -?aspiration with vomiting -10/29/2015 CT of chest-- RLL consolidation without any effusion or abscess Hypertension -Continue atenolol and lisinopril -Controlled Depression -Continue Celexa Headache -likely toxic headache due to fever, infection -doubt CNS infection -symptomatic treatment History of Reiter's syndrome -Check complements -Chek ANA  Family Communication: Husband updated at beside Disposition Plan: Home 2-3 days   Procedures/Studies: Dg Chest 2 View  10/27/2015  CLINICAL DATA:  Fever, dry cough, body aches, decreased appetite for 3 days, hypertension, diabetes mellitus, GERD EXAM: CHEST  2 VIEW  COMPARISON:  None FINDINGS: Normal heart size, mediastinal contours, and pulmonary vascularity. RIGHT lower lobe infiltrate most likely representing pneumonia. Underlying bronchitic changes. Remaining lungs grossly clear. No pleural effusion or pneumothorax. Bones demineralized. IMPRESSION: RIGHT lobe opacity most likely representing pneumonia. Followup PA and lateral chest X-ray is recommended in 3-4 weeks following trial of antibiotic therapy to ensure resolution and exclude underlying malignancy. Electronically Signed   By: Lavonia Dana M.D.   On: 10/27/2015 10:33   Ct Chest W Contrast  10/30/2015  CLINICAL DATA:  Pneumonia EXAM: CT CHEST WITH CONTRAST TECHNIQUE: Multidetector CT imaging of the chest was performed during intravenous contrast administration. CONTRAST:  67m OMNIPAQUE IOHEXOL 300 MG/ML  SOLN COMPARISON:  Chest radiograph 10/27/2015 FINDINGS: Normal heart size. Mild coronary artery calcification. Normal caliber thoracic aorta. Mild aortic calcification. Great vessel origins are patent. Scattered lymph nodes in the mediastinum are not pathologically enlarged and are likely reactive. Esophagus is decompressed. There is focal consolidation with air bronchograms demonstrated in the right lower lung, mostly involving the superior segment and basal segments. Changes are most likely due to pneumonia. Occult centrally obstructing lesion is not entirely excluded and follow-up chest radiograph in 4-6 weeks after resolution of acute process is recommended. Mild dependent atelectasis in the left lung base. Airways are patent. No pleural effusions. No pneumothorax. Included portions of the upper abdominal organs are grossly unremarkable. Degenerative changes in the spine. No destructive bone lesions. IMPRESSION: Consolidation in the right lower lobe most likely to represent pneumonia. Follow-up chest radiograph after resolution of acute symptoms is recommended to exclude occult centrally obstructing lesion.  Electronically Signed   By: WLucienne CapersM.D.   On: 10/30/2015 03:46         Subjective:  patient states her headache is much improved with changing to oxycodone. Denies any chest pain, shortness breath, hemoptysis, vomiting, diarrhea, dysuria, hematuria.  Has some abdominal pain today. Denies any synovitis. No unusual rashes.   Objective: Filed Vitals:   10/30/15 0825 10/30/15 1623 10/30/15 1705 10/30/15 1825  BP: 121/68     Pulse: 88     Temp: 101.2 F (38.4 C) 102.3 F (39.1 C) 102.3 F (39.1 C) 101.6 F (38.7 C)  TempSrc: Oral Oral Oral Oral  Resp: 18     Weight:      SpO2: 96%       Intake/Output Summary (Last 24 hours) at 10/30/15 1859 Last data filed at 10/30/15 1831  Gross per 24 hour  Intake 2002.5 ml  Output    525 ml  Net 1477.5 ml   Weight change: -0.454 kg (-1 lb) Exam:   General:  Pt is alert, follows commands appropriately, not in acute distress  HEENT: No icterus, No thrush, No neck mass, [place]/AT  Cardiovascular: RRR, S1/S2, no rubs, no gallops  Respiratory: right basilar crackles. Left clear to auscultation.   Abdomen: Soft/+BS, RLQ and periumbilical tender, non distended, no guarding  Extremities: No edema, No lymphangitis, No petechiae, No rashes, no synovitis; no clubbing or cyanosis   Data Reviewed: Basic Metabolic Panel:  Recent Labs Lab 10/27/15 0950 10/28/15 0744 10/29/15 0610 10/30/15 0835  NA 136 136 138 139  K 4.1 3.8 4.0 3.7  CL 100* 105 104 106  CO2 21* '22 22 24  ' GLUCOSE 232* 138* 148* 145*  BUN '10 9 12 10  ' CREATININE 0.99 0.85 0.83 0.72  CALCIUM 9.5 8.6* 8.9 8.5*   Liver Function Tests:  Recent Labs Lab 10/27/15 0950 10/28/15 0744  AST 23 21  ALT 22 17  ALKPHOS 91 68  BILITOT 1.4* 0.9  PROT 7.5 6.6  ALBUMIN 3.6 3.1*   No results for input(s): LIPASE, AMYLASE in the last 168 hours. No results for input(s): AMMONIA in the last 168 hours. CBC:  Recent Labs Lab 10/27/15 0950 10/28/15 0744 10/29/15 0610  10/30/15 0835  WBC 11.6* 9.5 8.5 8.4  NEUTROABS 9.1*  --   --  5.8  HGB 14.6 11.8* 11.5* 12.1  HCT 45.1 36.7 35.6* 35.9*  MCV 92.0 90.8 90.4 89.8  PLT 182 171 161 168   Cardiac Enzymes: No results for input(s): CKTOTAL, CKMB, CKMBINDEX, TROPONINI in the last 168 hours. BNP: Invalid input(s): POCBNP CBG:  Recent Labs Lab 10/29/15 1709 10/29/15 2045 10/30/15 0809 10/30/15 1121 10/30/15 1616  GLUCAP 111* 153* 144* 141* 112*    Recent Results (from the past 240 hour(s))  Culture, blood (routine x 2)     Status: None (Preliminary result)   Collection Time: 10/27/15  9:50 AM  Result Value Ref Range Status   Specimen Description BLOOD LEFT HAND  Final   Special Requests BOTTLES DRAWN AEROBIC AND ANAEROBIC 5MLS  Final   Culture NO GROWTH 3 DAYS  Final   Report Status PENDING  Incomplete  Culture, blood (routine x 2)     Status: None (Preliminary result)   Collection Time: 10/27/15  9:50 AM  Result Value Ref Range Status   Specimen Description BLOOD RIGHT ANTECUBITAL  Final   Special Requests BOTTLES DRAWN AEROBIC AND ANAEROBIC 5MLS  Final   Culture NO GROWTH 3 DAYS  Final   Report Status PENDING  Incomplete  Urine culture     Status: None   Collection Time: 10/27/15 11:28 AM  Result Value Ref Range Status   Specimen Description URINE, CLEAN CATCH  Final   Special Requests NONE  Final   Culture  MULTIPLE SPECIES PRESENT, SUGGEST RECOLLECTION  Final   Report Status 10/28/2015 FINAL  Final  MRSA PCR Screening     Status: None   Collection Time: 10/30/15 12:31 PM  Result Value Ref Range Status   MRSA by PCR NEGATIVE NEGATIVE Final    Comment:        The GeneXpert MRSA Assay (FDA approved for NASAL specimens only), is one component of a comprehensive MRSA colonization surveillance program. It is not intended to diagnose MRSA infection nor to guide or monitor treatment for MRSA infections.      Scheduled Meds: . atenolol  50 mg Oral Daily  . citalopram  20 mg Oral  BID  . enoxaparin (LOVENOX) injection  40 mg Subcutaneous Q24H  . guaiFENesin  1,200 mg Oral BID  . insulin aspart  0-5 Units Subcutaneous QHS  . insulin aspart  0-9 Units Subcutaneous TID WC  . levofloxacin (LEVAQUIN) IV  750 mg Intravenous Q24H  . lisinopril  10 mg Oral QHS  . loratadine  10 mg Oral Daily  . vancomycin  1,000 mg Intravenous Q12H   Continuous Infusions: . sodium chloride 0.9 % 1,000 mL infusion 75 mL/hr at 10/29/15 1924     Malie Kashani, DO  [facility] Pager [phone]  If 7PM-7AM, please contact night-coverage [email or web address] Password [email or web address] 10/30/2015, 6:59 PM   LOS: 3 days

## 2015-10-30 NOTE — Care Management Note (Signed)
Case Management Note  Patient Details  Name: Allison Yoder MRN: [id number] Date of Birth: 13-Aug-1951  Subjective/Objective:       CM following for progression and d/c planning.             Action/Plan: 10/30/2015 Met with pt , plan is to return to home, no d/c needs identified.   Expected Discharge Date:                  Expected Discharge Plan:  Home/Self Care  In-House Referral:  NA  Discharge planning Services  CM Consult  Post Acute Care Choice:  NA Choice offered to:  NA  DME Arranged:    DME Agency:     HH Arranged:    HH Agency:     Status of Service:  In process, will continue to follow  Medicare Important Message Given:  Yes Date Medicare IM Given:    Medicare IM give by:    Date Additional Medicare IM Given:    Additional Medicare Important Message give by:     If discussed at [place] of Stay Meetings, dates discussed:    Additional Comments:  Adron Bene, RN 10/30/2015, 11:24 AM

## 2015-10-30 NOTE — Care Management Important Message (Signed)
Important Message  Patient Details  Name: XIAO KAUSHIK MRN: [id number] Date of Birth: 1951/01/28   Medicare Important Message Given:  Yes    Vennie Salsbury, Rory Percy, RN 10/30/2015, 11:14 AM

## 2015-10-30 NOTE — Progress Notes (Signed)
10/30/2015  5:36 PM  Pt temperature increased 102.7 orally.  Dr. Carles Collet notified.  No orders received.  Will continue to monitor. [name]

## 2015-10-31 ENCOUNTER — Inpatient Hospital Stay (HOSPITAL_COMMUNITY): Payer: Medicare Other

## 2015-10-31 LAB — BASIC METABOLIC PANEL
Anion gap: 9 (ref 5–15)
BUN: 6 mg/dL (ref 6–20)
CO2: 26 mmol/L (ref 22–32)
Calcium: 8.6 mg/dL — ABNORMAL LOW (ref 8.9–10.3)
Chloride: 104 mmol/L (ref 101–111)
Creatinine, Ser: 0.76 mg/dL (ref 0.44–1.00)
GFR calc Af Amer: >60 mL/min (ref >60)
GFR calc non Af Amer: >60 mL/min (ref >60)
Glucose, Bld: 187 mg/dL — ABNORMAL HIGH (ref 65–99)
Potassium: 3.6 mmol/L (ref 3.5–5.1)
Sodium: 139 mmol/L (ref 135–145)

## 2015-10-31 LAB — GLUCOSE, CAPILLARY
Glucose-Capillary: 152 mg/dL — ABNORMAL HIGH (ref 65–99)
Glucose-Capillary: 121 mg/dL — ABNORMAL HIGH (ref 65–99)
Glucose-Capillary: 66 mg/dL (ref 65–99)
Glucose-Capillary: 162 mg/dL — ABNORMAL HIGH (ref 65–99)

## 2015-10-31 LAB — VANCOMYCIN, TROUGH: Vancomycin Tr: 8 ug/mL — ABNORMAL LOW (ref 10.0–20.0)

## 2015-10-31 MED ORDER — IOHEXOL 300 MG/ML  SOLN
100.0000 mL | Freq: Once | INTRAMUSCULAR | Status: AC | PRN
  Administered 2015-10-31: 100 mL via INTRAVENOUS

## 2015-10-31 MED ORDER — IOHEXOL 300 MG/ML  SOLN
25.0000 mL | INTRAMUSCULAR | Status: AC
  Administered 2015-10-31 (×2): 25 mL via ORAL

## 2015-10-31 MED ORDER — SALINE SPRAY 0.65 % NA SOLN
1.0000 | NASAL | Status: DC | PRN
  Administered 2015-10-31: 1 via NASAL
  Filled 2015-10-31: qty 44

## 2015-10-31 NOTE — Progress Notes (Signed)
PROGRESS NOTE  Allison Yoder KZL:[id number] DOB: 05-Apr-1951 DOA: 10/27/2015 PCP: Elsie Stain, MD  Brief History 65 year old female with history of hypertension, fibromyalgia, hepatic steatosis presented with 2 day history of worsening coughing, myalgias and arthralgias, fevers, and associated nausea and vomiting. The patient states that she has had URI type symptoms since December 2016. She had been treated with 2 courses of doxycycline during which she had some transient improvement. On 10/26/2015, the patient had a fever of 104.27F at home with the above constellation of symptoms which pondered her presentation to the emergency department. Lactic acid peaked at 3.16. The patient was started on vancomycin and levofloxacin. Assessment/Plan: Sepsis -Present the time of admission -Secondary to pneumonia -Lactic acid 3.16-->0.9 -Continue levofloxacin and vancomycin -blood cultures neg to date -Urine legionella antigen, urine Streptococcus pneumoniae antigen negative -Restart IV fluids -Due to persistent fever or intermitten abdominal pain, obtain CT abdomen -CT abd/pelvis--no acute findings aside from multifocal pneumonia right greater than left lower lobes with trace pleural effusions. -ESR 108 -CRP 26.0 Community-acquired pneumonia -Chest x-ray revealed right lower lobe opacity -Influenza PCR negative -Viral respiratory panel--neg -Continue antibiotics pending culture data -continue levoflox D#5, discontinue vanco -?aspiration with vomiting -10/29/2015 CT of chest-- RLL consolidation without any effusion or abscess Hypertension -Continue atenolol and lisinopril -Controlled Depression -Continue Celexa Headache -likely toxic headache due to fever, infection -doubt CNS infection--HA improves with improving fever -neg Kernig/Brudzinski's -symptomatic treatment History of Reiter's syndrome -Check complements--pending -Chek ANA--pending Diabetes mellitus type 2 -Not  previously on any agents -Hemoglobin A1c 7.2 -Continue NovoLog sliding scale  Family Communication: Husband updated at beside Disposition Plan: Home when afebrile and stable      Procedures/Studies: Dg Chest 2 View  10/27/2015  CLINICAL DATA:  Fever, dry cough, body aches, decreased appetite for 3 days, hypertension, diabetes mellitus, GERD EXAM: CHEST  2 VIEW COMPARISON:  None FINDINGS: Normal heart size, mediastinal contours, and pulmonary vascularity. RIGHT lower lobe infiltrate most likely representing pneumonia. Underlying bronchitic changes. Remaining lungs grossly clear. No pleural effusion or pneumothorax. Bones demineralized. IMPRESSION: RIGHT lobe opacity most likely representing pneumonia. Followup PA and lateral chest X-ray is recommended in 3-4 weeks following trial of antibiotic therapy to ensure resolution and exclude underlying malignancy. Electronically Signed   By: Lavonia Dana M.D.   On: 10/27/2015 10:33   Ct Chest W Contrast  10/30/2015  CLINICAL DATA:  Pneumonia EXAM: CT CHEST WITH CONTRAST TECHNIQUE: Multidetector CT imaging of the chest was performed during intravenous contrast administration. CONTRAST:  32m OMNIPAQUE IOHEXOL 300 MG/ML  SOLN COMPARISON:  Chest radiograph 10/27/2015 FINDINGS: Normal heart size. Mild coronary artery calcification. Normal caliber thoracic aorta. Mild aortic calcification. Great vessel origins are patent. Scattered lymph nodes in the mediastinum are not pathologically enlarged and are likely reactive. Esophagus is decompressed. There is focal consolidation with air bronchograms demonstrated in the right lower lung, mostly involving the superior segment and basal segments. Changes are most likely due to pneumonia. Occult centrally obstructing lesion is not entirely excluded and follow-up chest radiograph in 4-6 weeks after resolution of acute process is recommended. Mild dependent atelectasis in the left lung base. Airways are patent. No pleural  effusions. No pneumothorax. Included portions of the upper abdominal organs are grossly unremarkable. Degenerative changes in the spine. No destructive bone lesions. IMPRESSION: Consolidation in the right lower lobe most likely to represent pneumonia. Follow-up chest radiograph after resolution of acute symptoms is recommended to exclude occult centrally  obstructing lesion. Electronically Signed   By: Lucienne Capers M.D.   On: 10/30/2015 03:46   Ct Abdomen Pelvis W Contrast  10/31/2015  CLINICAL DATA:  Diffuse abdominal pain. EXAM: CT ABDOMEN AND PELVIS WITH CONTRAST TECHNIQUE: Multidetector CT imaging of the abdomen and pelvis was performed using the standard protocol following bolus administration of intravenous contrast. CONTRAST:  193m OMNIPAQUE IOHEXOL 300 MG/ML  SOLN COMPARISON:  02/10/2012 abdominal sonogram. No prior CT abdomen/ pelvis. 10/29/2015 chest CT. FINDINGS: Lower chest: There is extensive patchy consolidation and ground-glass opacity in the visualized dependent right lower lobe, not appreciably changed since the chest CT study from 2 days prior. There is mild patchy consolidation and ground-glass opacity in the basilar left lower lobe, unchanged. New trace bilateral pleural effusions, right greater than left . Hepatobiliary: Normal liver with no liver mass. Normal gallbladder with no radiopaque cholelithiasis. No intrahepatic biliary ductal dilatation. Common bile duct diameter 6 mm, upper normal for age. No radiopaque choledocholithiasis. Pancreas: Normal, with no mass or duct dilation. Spleen: Normal size. No mass. Adrenals/Urinary Tract: Normal adrenals. There are subcentimeter hypodense renal cortical lesions in both kidneys, too small to characterize. Otherwise normal kidneys, with no hydronephrosis. Normal bladder. Stomach/Bowel: Grossly normal stomach. Normal caliber small bowel with no small bowel wall thickening. Normal appendix. Mild to moderate sigmoid diverticulosis, with no large  bowel wall thickening or pericolonic fat stranding. Vascular/Lymphatic: Atherosclerotic nonaneurysmal abdominal aorta. Patent portal, splenic, hepatic and renal veins. No pathologically enlarged lymph nodes in the abdomen or pelvis. Reproductive: Status post hysterectomy, with no abnormal findings at the vaginal cuff. No adnexal mass. Other: Nonspecific small volume simple free fluid in the pelvic cul-de-sac. No abdominal ascites. No focal fluid collection. No pneumoperitoneum. Musculoskeletal: There is bony expansion and trabecular thickening throughout the left hemipelvis suggestive of Paget's disease. Moderate degenerative changes in the visualized thoracolumbar spine. IMPRESSION: 1. Patchy consolidation and ground-glass opacity at the right greater than left lung bases, unchanged from the chest CT study performed 2 days prior, most suggestive of a multifocal pneumonia. New trace bilateral pleural effusions, right greater than left. Recommend follow-up PA and lateral post treatment chest radiographs in 4-6 weeks. 2. Nonspecific small volume simple free fluid in the pelvic cul-de-sac. 3. Otherwise no acute abnormality in the abdomen and pelvis. Mild to moderate sigmoid diverticulosis. No evidence of bowel obstruction or acute bowel inflammation. Normal appendix. 4. 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 Electronically Signed   By: JIlona SorrelM.D.   On: 10/31/2015 16:57         Subjective: Oral patient states that she is feeling better. Denies any headaches today, chest pain, nausea, vomiting, diarrhea, vomiting, dysuria, hematuria, rashes, synovitis. Not as dyspneic with exertion.  Objective: Filed Vitals:   10/31/15 0404 10/31/15 0455 10/31/15 0929 10/31/15 1717  BP:  126/63 121/75 160/72  Pulse:  75 77 71  Temp:  98.5 F (36.9 C) 99 F (37.2 C) 99.5 F (37.5 C)  TempSrc:   Oral Oral  Resp:  '22 16 16  ' Height: '5\' 7"'  (1.702 m)     Weight:  83.915 kg (185 lb)    SpO2:  96% 95% 97%     Intake/Output Summary (Last 24 hours) at 10/31/15 1752 Last data filed at 10/31/15 1500  Gross per 24 hour  Intake   2300 ml  Output    300 ml  Net   2000 ml   Weight change: 1.361 kg (3 lb) Exam:   General:  Pt  is alert, follows commands appropriately, not in acute distress  HEENT: No icterus, No thrush, No neck mass, [place]/AT  Cardiovascular: RRR, S1/S2, no rubs, no gallops  Respiratory: Left foot auscultation, right basilar crackles. No wheezing  Abdomen: Soft/+BS, non tender, non distended, no guarding; no hepatosplenomegaly  Extremities: No edema, No lymphangitis, No petechiae, No rashes, no synovitis; no cyanosis or clubbing  Data Reviewed: Basic Metabolic Panel:  Recent Labs Lab 10/27/15 0950 10/28/15 0744 10/29/15 0610 10/30/15 0835 10/31/15 0657  NA 136 136 138 139 139  K 4.1 3.8 4.0 3.7 3.6  CL 100* 105 104 106 104  CO2 21* '22 22 24 26  ' GLUCOSE 232* 138* 148* 145* 187*  BUN '10 9 12 10 6  ' CREATININE 0.99 0.85 0.83 0.72 0.76  CALCIUM 9.5 8.6* 8.9 8.5* 8.6*   Liver Function Tests:  Recent Labs Lab 10/27/15 0950 10/28/15 0744  AST 23 21  ALT 22 17  ALKPHOS 91 68  BILITOT 1.4* 0.9  PROT 7.5 6.6  ALBUMIN 3.6 3.1*   No results for input(s): LIPASE, AMYLASE in the last 168 hours. No results for input(s): AMMONIA in the last 168 hours. CBC:  Recent Labs Lab 10/27/15 0950 10/28/15 0744 10/29/15 0610 10/30/15 0835  WBC 11.6* 9.5 8.5 8.4  NEUTROABS 9.1*  --   --  5.8  HGB 14.6 11.8* 11.5* 12.1  HCT 45.1 36.7 35.6* 35.9*  MCV 92.0 90.8 90.4 89.8  PLT 182 171 161 168   Cardiac Enzymes: No results for input(s): CKTOTAL, CKMB, CKMBINDEX, TROPONINI in the last 168 hours. BNP: Invalid input(s): POCBNP CBG:  Recent Labs Lab 10/30/15 1121 10/30/15 1616 10/30/15 2022 10/31/15 0806 10/31/15 1219  GLUCAP 141* 112* 217* 152* 121*    Recent Results (from the past 240 hour(s))  Culture, blood (routine x 2)     Status: None (Preliminary  result)   Collection Time: 10/27/15  9:50 AM  Result Value Ref Range Status   Specimen Description BLOOD LEFT HAND  Final   Special Requests BOTTLES DRAWN AEROBIC AND ANAEROBIC 5MLS  Final   Culture NO GROWTH 4 DAYS  Final   Report Status PENDING  Incomplete  Culture, blood (routine x 2)     Status: None (Preliminary result)   Collection Time: 10/27/15  9:50 AM  Result Value Ref Range Status   Specimen Description BLOOD RIGHT ANTECUBITAL  Final   Special Requests BOTTLES DRAWN AEROBIC AND ANAEROBIC 5MLS  Final   Culture NO GROWTH 4 DAYS  Final   Report Status PENDING  Incomplete  Urine culture     Status: None   Collection Time: 10/27/15 11:28 AM  Result Value Ref Range Status   Specimen Description URINE, CLEAN CATCH  Final   Special Requests NONE  Final   Culture MULTIPLE SPECIES PRESENT, SUGGEST RECOLLECTION  Final   Report Status 10/28/2015 FINAL  Final  Respiratory virus panel     Status: None   Collection Time: 10/28/15  6:46 PM  Result Value Ref Range Status   Respiratory Syncytial Virus A Negative Negative Final   Respiratory Syncytial Virus B Negative Negative Final   Influenza A Negative Negative Final   Influenza B Negative Negative Final   Parainfluenza 1 Negative Negative Final   Parainfluenza 2 Negative Negative Final   Parainfluenza 3 Negative Negative Final   Metapneumovirus Negative Negative Final   Rhinovirus Negative Negative Final   Adenovirus Negative Negative Final    Comment: (NOTE) Performed At: [facility] [address]  [address] [place], [place] [id number] Lindon Romp MD QM:[id number]   MRSA PCR Screening     Status: None   Collection Time: 10/30/15 12:31 PM  Result Value Ref Range Status   MRSA by PCR NEGATIVE NEGATIVE Final    Comment:        The GeneXpert MRSA Assay (FDA approved for NASAL specimens only), is one component of a comprehensive MRSA colonization surveillance program. It is not intended to diagnose MRSA infection nor to  guide or monitor treatment for MRSA infections.   Culture, blood (Routine X 2) w Reflex to ID Panel     Status: None (Preliminary result)   Collection Time: 10/30/15  4:41 PM  Result Value Ref Range Status   Specimen Description BLOOD LEFT ANTECUBITAL  Final   Special Requests BOTTLES DRAWN AEROBIC AND ANAEROBIC 10CC  Final   Culture NO GROWTH < 24 HOURS  Final   Report Status PENDING  Incomplete  Culture, blood (Routine X 2) w Reflex to ID Panel     Status: None (Preliminary result)   Collection Time: 10/30/15  4:44 PM  Result Value Ref Range Status   Specimen Description BLOOD RIGHT ANTECUBITAL  Final   Special Requests BOTTLES DRAWN AEROBIC AND ANAEROBIC 10CC  Final   Culture NO GROWTH < 24 HOURS  Final   Report Status PENDING  Incomplete     Scheduled Meds: . atenolol  50 mg Oral Daily  . citalopram  20 mg Oral BID  . enoxaparin (LOVENOX) injection  40 mg Subcutaneous Q24H  . guaiFENesin  1,200 mg Oral BID  . insulin aspart  0-5 Units Subcutaneous QHS  . insulin aspart  0-9 Units Subcutaneous TID WC  . levofloxacin (LEVAQUIN) IV  750 mg Intravenous Q24H  . lisinopril  10 mg Oral QHS  . loratadine  10 mg Oral Daily   Continuous Infusions: . sodium chloride 0.9 % 1,000 mL infusion 75 mL/hr at 10/29/15 1924     Rillie Riffel, DO  [facility] Pager [phone]  If 7PM-7AM, please contact night-coverage [email or web address] Password [email or web address] 10/31/2015, 5:52 PM   LOS: 4 days

## 2015-11-01 LAB — CULTURE, BLOOD (ROUTINE X 2)
Culture: NO GROWTH
Culture: NO GROWTH

## 2015-11-01 LAB — C4 COMPLEMENT: Complement C4, Body Fluid: 39 mg/dL (ref 14–44)

## 2015-11-01 LAB — GLUCOSE, CAPILLARY
Glucose-Capillary: 118 mg/dL — ABNORMAL HIGH (ref 65–99)
Glucose-Capillary: 176 mg/dL — ABNORMAL HIGH (ref 65–99)
Glucose-Capillary: 88 mg/dL (ref 65–99)

## 2015-11-01 LAB — C3 COMPLEMENT: C3 Complement: 176 mg/dL — ABNORMAL HIGH (ref 82–167)

## 2015-11-01 MED ORDER — LEVOFLOXACIN 750 MG PO TABS: 750.0000 mg | ORAL_TABLET | Freq: Every day | ORAL | Status: DC

## 2015-11-01 MED ORDER — HYDROCODONE-HOMATROPINE 5-1.5 MG/5ML PO SYRP: 5.0000 mL | ORAL_SOLUTION | Freq: Four times a day (QID) | ORAL | Status: DC | PRN

## 2015-11-01 MED ORDER — CITALOPRAM HYDROBROMIDE 20 MG PO TABS: 20.0000 mg | ORAL_TABLET | Freq: Two times a day (BID) | ORAL | Status: DC

## 2015-11-01 MED ORDER — OXYCODONE HCL 5 MG PO TABS: 5.0000 mg | ORAL_TABLET | ORAL | Status: DC | PRN

## 2015-11-01 MED ORDER — DIPHENHYDRAMINE HCL 25 MG PO CAPS
25.0000 mg | ORAL_CAPSULE | Freq: Four times a day (QID) | ORAL | Status: DC | PRN
  Administered 2015-11-01: 25 mg via ORAL
  Filled 2015-11-01: qty 1

## 2015-11-01 NOTE — Discharge Summary (Signed)
Physician Discharge Summary  Allison Yoder ZOX:[id number] DOB: [birth year] DOA: 10/27/2015  PCP: Elsie Stain, MD  Admit date: 10/27/2015 Discharge date: 11/01/2015  Recommendations for Outpatient Follow-up:  1. Pt will need to follow up with PCP in 2 weeks post discharge 2. Please obtain BMP and CBC in one week 3. Please follow up with pt regarding HbA1C 7.2--pt wishes to defer meds until she speaks with Dr. Damita Dunnings  Discharge Diagnoses:  Sepsis -Present the time of admission -Secondary to pneumonia -Lactic acid 3.16-->0.9 -Continue levofloxacin and vancomycin -blood cultures neg to date -Urine legionella antigen, urine Streptococcus pneumoniae antigen negative -Restart IV fluids -Due to persistent fever or intermitten abdominal pain, obtain CT abdomen -CT abd/pelvis--no acute findings aside from multifocal pneumonia right greater than left lower lobes with trace pleural effusions. -ESR 108 -CRP 26.0 Community-acquired pneumonia -pt had prolonged febrile response and extensive work up for FUO -Chest x-ray revealed right lower lobe opacity -Influenza PCR negative -Viral respiratory panel--neg -Continue antibiotics pending culture data -continue levoflox D#6, discontinued vanco 2/18 -Home with levofloxacin for 2 additional days to complete 8 days of therapy. -all blood cultures neg -10/31/15--CT abdomen and pelvis--sigmoid diverticulosis without other acute intra-abdominal findings -10/29/2015 CT of chest-- RLL consolidation without any effusion or abscess -Fortunately, the patient remained afebrile for over 24 hours, and she was discharged in stable condition on 11/01/2015 Hypertension -Continue atenolol and lisinopril -Controlled Depression -Continue Celexa Headache -likely toxic headache due to fever, infection -doubt CNS infection--HA improves with improving fever -neg Kernig/Brudzinski's -symptomatic treatment History of Reiter's syndrome -Check  complements--C3--176, C4--39 -Chek ANA--pending Diabetes mellitus type 2 -Hemoglobin A1c 7.2 -Continue NovoLog sliding scale -Patient states that she used to take metformin until she stopped one year ago when she lost  Weight.  Now that she has gained weight back, she has noticed that her CBGs have been more elevated. She wishes to discuss with her primary care provider before restarting any medications    Discharge Condition: stable  Disposition: home  Diet:carb modified Wt Readings from Last 3 Encounters:  10/31/15 83.5 kg (184 lb 1.4 oz)  10/26/15 82.328 kg (181 lb 8 oz)  09/24/15 80.65 kg (177 lb 12.8 oz)    History of present illness:  65 year old female with history of hypertension, fibromyalgia, hepatic steatosis presented with 2 day history of worsening coughing, myalgias and arthralgias, fevers, and associated nausea and vomiting. The patient states that she has had URI type symptoms since December 2016. She had been treated with 2 courses of doxycycline during which she had some transient improvement. On 10/26/2015, the patient had a fever of 104.72F at home with the above constellation of symptoms which pondered her presentation to the emergency department. Lactic acid peaked at 3.16. The patient was started on vancomycin and levofloxacin. The patient had prolonged febrile response with extensive workup for fever of unknown origin. Fortunately, everything was negative. Blood cultures remain negative and the patient was hemodynamically stable. Fortunately, the patient subsequently defervesced for over 24 hours prior to discharge.  Discharge Exam: Filed Vitals:   11/01/15 0927 11/01/15 1700  BP: 146/82 160/79  Pulse: 72 70  Temp: 98.5 F (36.9 C) 98.8 F (37.1 C)  Resp: 16 18   Filed Vitals:   10/31/15 2102 11/01/15 0426 11/01/15 0927 11/01/15 1700  BP: 130/56 146/72 146/82 160/79  Pulse: 80 65 72 70  Temp: 98.9 F (37.2 C) 98.6 F (37 C) 98.5 F (36.9 C) 98.8 F  (37.1 C)  TempSrc: Oral Oral Oral Oral  Resp: '19 19 16 18  ' Height:      Weight: 83.5 kg (184 lb 1.4 oz)     SpO2: 94% 95% 94% 94%   General: A&O x 3, NAD, pleasant, cooperative Cardiovascular: RRR, no rub, no gallop, no S3 Respiratory: Bibasilar crackles, right greater than left. No wheezing. Abdomen:soft, nontender, nondistended, positive bowel sounds Extremities: No edema, No lymphangitis, no petechiae  Discharge Instructions     Medication List    STOP taking these medications        clonazePAM 0.5 MG tablet  Commonly known as:  KLONOPIN     fluticasone 50 MCG/ACT nasal spray  Commonly known as:  FLONASE     hydrocortisone 25 MG suppository  Commonly known as:  ANUSOL-HC     methocarbamol 500 MG tablet  Commonly known as:  ROBAXIN     nitrofurantoin 100 MG capsule  Commonly known as:  MACRODANTIN      TAKE these medications        albuterol 108 (90 Base) MCG/ACT inhaler  Commonly known as:  PROVENTIL HFA;VENTOLIN HFA  Inhale 1-2 puffs into the lungs every 6 (six) hours as needed for wheezing or shortness of breath.     atenolol 50 MG tablet  Commonly known as:  TENORMIN  Take 1 tablet (50 mg total) by mouth daily.     Calcium-Vitamin D 600-200 MG-UNIT tablet  Take 1 tablet by mouth 2 (two) times daily.     cetirizine 10 MG tablet  Commonly known as:  ZYRTEC  Take 10 mg by mouth as needed.     citalopram 20 MG tablet  Commonly known as:  CELEXA  Take 1 tablet (20 mg total) by mouth 2 (two) times daily.     HYDROcodone-homatropine 5-1.5 MG/5ML syrup  Commonly known as:  HYCODAN  Take 5 mLs by mouth every 6 (six) hours as needed for cough.     levofloxacin 750 MG tablet  Commonly known as:  LEVAQUIN  Take 1 tablet (750 mg total) by mouth daily.  Start taking on:  11/02/2015     lisinopril 10 MG tablet  Commonly known as:  PRINIVIL,ZESTRIL  Take 1 tablet (10 mg total) by mouth at bedtime.     multivitamin tablet  Take 1 tablet by mouth daily.       oxyCODONE 5 MG immediate release tablet  Commonly known as:  Oxy IR/ROXICODONE  Take 1 tablet (5 mg total) by mouth every 4 (four) hours as needed for moderate pain.     ranitidine 150 MG tablet  Commonly known as:  ZANTAC  Take 150 mg by mouth daily.     vitamin B-12 1000 MCG tablet  Commonly known as:  CYANOCOBALAMIN  Take 1,000 mcg by mouth daily.         The results of significant diagnostics from this hospitalization (including imaging, microbiology, ancillary and laboratory) are listed below for reference.    Significant Diagnostic Studies: Dg Chest 2 View  10/27/2015  CLINICAL DATA:  Fever, dry cough, body aches, decreased appetite for 3 days, hypertension, diabetes mellitus, GERD EXAM: CHEST  2 VIEW COMPARISON:  None FINDINGS: Normal heart size, mediastinal contours, and pulmonary vascularity. RIGHT lower lobe infiltrate most likely representing pneumonia. Underlying bronchitic changes. Remaining lungs grossly clear. No pleural effusion or pneumothorax. Bones demineralized. IMPRESSION: RIGHT lobe opacity most likely representing pneumonia. Followup PA and lateral chest X-ray is recommended in 3-4 weeks following trial of antibiotic therapy to ensure resolution and exclude underlying malignancy.  Electronically Signed   By: Lavonia Dana M.D.   On: 10/27/2015 10:33   Ct Chest W Contrast  10/30/2015  CLINICAL DATA:  Pneumonia EXAM: CT CHEST WITH CONTRAST TECHNIQUE: Multidetector CT imaging of the chest was performed during intravenous contrast administration. CONTRAST:  56m OMNIPAQUE IOHEXOL 300 MG/ML  SOLN COMPARISON:  Chest radiograph 10/27/2015 FINDINGS: Normal heart size. Mild coronary artery calcification. Normal caliber thoracic aorta. Mild aortic calcification. Great vessel origins are patent. Scattered lymph nodes in the mediastinum are not pathologically enlarged and are likely reactive. Esophagus is decompressed. There is focal consolidation with air bronchograms demonstrated  in the right lower lung, mostly involving the superior segment and basal segments. Changes are most likely due to pneumonia. Occult centrally obstructing lesion is not entirely excluded and follow-up chest radiograph in 4-6 weeks after resolution of acute process is recommended. Mild dependent atelectasis in the left lung base. Airways are patent. No pleural effusions. No pneumothorax. Included portions of the upper abdominal organs are grossly unremarkable. Degenerative changes in the spine. No destructive bone lesions. IMPRESSION: Consolidation in the right lower lobe most likely to represent pneumonia. Follow-up chest radiograph after resolution of acute symptoms is recommended to exclude occult centrally obstructing lesion. Electronically Signed   By: WLucienne CapersM.D.   On: 10/30/2015 03:46   Ct Abdomen Pelvis W Contrast  10/31/2015  CLINICAL DATA:  Diffuse abdominal pain. EXAM: CT ABDOMEN AND PELVIS WITH CONTRAST TECHNIQUE: Multidetector CT imaging of the abdomen and pelvis was performed using the standard protocol following bolus administration of intravenous contrast. CONTRAST:  1021mOMNIPAQUE IOHEXOL 300 MG/ML  SOLN COMPARISON:  02/10/2012 abdominal sonogram. No prior CT abdomen/ pelvis. 10/29/2015 chest CT. FINDINGS: Lower chest: There is extensive patchy consolidation and ground-glass opacity in the visualized dependent right lower lobe, not appreciably changed since the chest CT study from 2 days prior. There is mild patchy consolidation and ground-glass opacity in the basilar left lower lobe, unchanged. New trace bilateral pleural effusions, right greater than left . Hepatobiliary: Normal liver with no liver mass. Normal gallbladder with no radiopaque cholelithiasis. No intrahepatic biliary ductal dilatation. Common bile duct diameter 6 mm, upper normal for age. No radiopaque choledocholithiasis. Pancreas: Normal, with no mass or duct dilation. Spleen: Normal size. No mass. Adrenals/Urinary  Tract: Normal adrenals. There are subcentimeter hypodense renal cortical lesions in both kidneys, too small to characterize. Otherwise normal kidneys, with no hydronephrosis. Normal bladder. Stomach/Bowel: Grossly normal stomach. Normal caliber small bowel with no small bowel wall thickening. Normal appendix. Mild to moderate sigmoid diverticulosis, with no large bowel wall thickening or pericolonic fat stranding. Vascular/Lymphatic: Atherosclerotic nonaneurysmal abdominal aorta. Patent portal, splenic, hepatic and renal veins. No pathologically enlarged lymph nodes in the abdomen or pelvis. Reproductive: Status post hysterectomy, with no abnormal findings at the vaginal cuff. No adnexal mass. Other: Nonspecific small volume simple free fluid in the pelvic cul-de-sac. No abdominal ascites. No focal fluid collection. No pneumoperitoneum. Musculoskeletal: There is bony expansion and trabecular thickening throughout the left hemipelvis suggestive of Paget's disease. Moderate degenerative changes in the visualized thoracolumbar spine. IMPRESSION: 1. Patchy consolidation and ground-glass opacity at the right greater than left lung bases, unchanged from the chest CT study performed 2 days prior, most suggestive of a multifocal pneumonia. New trace bilateral pleural effusions, right greater than left. Recommend follow-up PA and lateral post treatment chest radiographs in 4-6 weeks. 2. Nonspecific small volume simple free fluid in the pelvic cul-de-sac. 3. Otherwise no acute abnormality in the abdomen and  pelvis. Mild to moderate sigmoid diverticulosis. No evidence of bowel obstruction or acute bowel inflammation. Normal appendix. 4. 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 Electronically Signed   By: Ilona Sorrel M.D.   On: 10/31/2015 16:57     Microbiology: Recent Results (from the past 240 hour(s))  Culture, blood (routine x 2)     Status: None   Collection Time: 10/27/15  9:50 AM  Result Value Ref Range  Status   Specimen Description BLOOD LEFT HAND  Final   Special Requests BOTTLES DRAWN AEROBIC AND ANAEROBIC 5MLS  Final   Culture NO GROWTH 5 DAYS  Final   Report Status 11/01/2015 FINAL  Final  Culture, blood (routine x 2)     Status: None   Collection Time: 10/27/15  9:50 AM  Result Value Ref Range Status   Specimen Description BLOOD RIGHT ANTECUBITAL  Final   Special Requests BOTTLES DRAWN AEROBIC AND ANAEROBIC 5MLS  Final   Culture NO GROWTH 5 DAYS  Final   Report Status 11/01/2015 FINAL  Final  Urine culture     Status: None   Collection Time: 10/27/15 11:28 AM  Result Value Ref Range Status   Specimen Description URINE, CLEAN CATCH  Final   Special Requests NONE  Final   Culture MULTIPLE SPECIES PRESENT, SUGGEST RECOLLECTION  Final   Report Status 10/28/2015 FINAL  Final  Respiratory virus panel     Status: None   Collection Time: 10/28/15  6:46 PM  Result Value Ref Range Status   Respiratory Syncytial Virus A Negative Negative Final   Respiratory Syncytial Virus B Negative Negative Final   Influenza A Negative Negative Final   Influenza B Negative Negative Final   Parainfluenza 1 Negative Negative Final   Parainfluenza 2 Negative Negative Final   Parainfluenza 3 Negative Negative Final   Metapneumovirus Negative Negative Final   Rhinovirus Negative Negative Final   Adenovirus Negative Negative Final    Comment: (NOTE) Performed At: [facility] [place], [place] [id number] Lindon Romp MD ER:[id number]   MRSA PCR Screening     Status: None   Collection Time: 10/30/15 12:31 PM  Result Value Ref Range Status   MRSA by PCR NEGATIVE NEGATIVE Final    Comment:        The GeneXpert MRSA Assay (FDA approved for NASAL specimens only), is one component of a comprehensive MRSA colonization surveillance program. It is not intended to diagnose MRSA infection nor to guide or monitor treatment for MRSA infections.   Culture, blood (Routine X 2) w  Reflex to ID Panel     Status: None (Preliminary result)   Collection Time: 10/30/15  4:41 PM  Result Value Ref Range Status   Specimen Description BLOOD LEFT ANTECUBITAL  Final   Special Requests BOTTLES DRAWN AEROBIC AND ANAEROBIC 10CC  Final   Culture NO GROWTH 2 DAYS  Final   Report Status PENDING  Incomplete  Culture, blood (Routine X 2) w Reflex to ID Panel     Status: None (Preliminary result)   Collection Time: 10/30/15  4:44 PM  Result Value Ref Range Status   Specimen Description BLOOD RIGHT ANTECUBITAL  Final   Special Requests BOTTLES DRAWN AEROBIC AND ANAEROBIC 10CC  Final   Culture NO GROWTH 2 DAYS  Final   Report Status PENDING  Incomplete     Labs: Basic Metabolic Panel:  Recent Labs Lab 10/27/15 0950 10/28/15 0744 10/29/15 0610 10/30/15 0835 10/31/15 0657  NA 136 136  138 139 139  K 4.1 3.8 4.0 3.7 3.6  CL 100* 105 104 106 104  CO2 21* '22 22 24 26  ' GLUCOSE 232* 138* 148* 145* 187*  BUN '10 9 12 10 6  ' CREATININE 0.99 0.85 0.83 0.72 0.76  CALCIUM 9.5 8.6* 8.9 8.5* 8.6*   Liver Function Tests:  Recent Labs Lab 10/27/15 0950 10/28/15 0744  AST 23 21  ALT 22 17  ALKPHOS 91 68  BILITOT 1.4* 0.9  PROT 7.5 6.6  ALBUMIN 3.6 3.1*   No results for input(s): LIPASE, AMYLASE in the last 168 hours. No results for input(s): AMMONIA in the last 168 hours. CBC:  Recent Labs Lab 10/27/15 0950 10/28/15 0744 10/29/15 0610 10/30/15 0835  WBC 11.6* 9.5 8.5 8.4  NEUTROABS 9.1*  --   --  5.8  HGB 14.6 11.8* 11.5* 12.1  HCT 45.1 36.7 35.6* 35.9*  MCV 92.0 90.8 90.4 89.8  PLT 182 171 161 168   Cardiac Enzymes: No results for input(s): CKTOTAL, CKMB, CKMBINDEX, TROPONINI in the last 168 hours. BNP: Invalid input(s): POCBNP CBG:  Recent Labs Lab 10/31/15 1715 10/31/15 2059 11/01/15 0752 11/01/15 1204 11/01/15 1722  GLUCAP 66 162* 118* 176* 88    Time coordinating discharge:  Greater than 30 minutes  Signed:  Pecolia Marando, DO [facility] Pager: [phone] 11/01/2015, 6:55 PM

## 2015-11-01 NOTE — Progress Notes (Signed)
Patient c/o itching which she states she has at times taking oxycodone.  Called [facility] and made aware.  Received order for Benadryl PO.  Medication given.  Will continue to monitor patient.  [employer] RN-BC, WTA.

## 2015-11-02 LAB — MPO/PR-3 (ANCA) ANTIBODIES
ANCA Proteinase 3: <3.5 U/mL (ref 0.0–3.5)
Myeloperoxidase Abs: <9 U/mL (ref 0.0–9.0)

## 2015-11-02 LAB — ANTINUCLEAR ANTIBODIES, IFA: ANA Ab, IFA: NEGATIVE

## 2015-11-03 ENCOUNTER — Telehealth: Payer: Self-pay

## 2015-11-03 LAB — ANCA TITERS
Atypical P-ANCA titer: <1:20 {titer}
C-ANCA: <1:20 {titer}
P-ANCA: <1:20 {titer}

## 2015-11-03 NOTE — Telephone Encounter (Signed)
Transition Care Management Follow-up Telephone Call     Date discharged? 11/01/2015   How have you been since you were released from the hospital? Generalized weakness   Do you understand why you were in the hospital? Yes   Do you understand the discharge instructions? Yes   Where were you discharged to? Home   Items Reviewed:  Medications reviewed: Yes  Allergies reviewed: Yes  Dietary changes reviewed: N/A - info not listed on discharge summary  Referrals reviewed: N/A - info not listed on discharge summary   Functional Questionnaire:   Activities of Daily Living (ADLs):   He states they are independent in the following: independent with ADLs States they require assistance with the following: none   Any transportation issues/concerns?: NO   Any patient concerns? NO   Confirmed importance and date/time of follow-up visits scheduled YES  Provider Appointment booked with Dr. Damita Dunnings 11/13/15 @ 3PM  Confirmed with patient if condition begins to worsen call PCP or go to the ER.  Patient was given the office number and encouraged to call back with question or concerns.  : YES

## 2015-11-04 LAB — CULTURE, BLOOD (ROUTINE X 2)
Culture: NO GROWTH
Culture: NO GROWTH

## 2015-11-12 ENCOUNTER — Telehealth: Payer: Self-pay | Admitting: *Deleted

## 2015-11-12 NOTE — Telephone Encounter (Signed)
Received request for PA for Celexa from [facility]  PA submitted thru [facility], awaiting response.

## 2015-11-13 ENCOUNTER — Ambulatory Visit (INDEPENDENT_AMBULATORY_CARE_PROVIDER_SITE_OTHER): Payer: Medicare Other | Admitting: Family Medicine

## 2015-11-13 ENCOUNTER — Encounter: Payer: Self-pay | Admitting: Family Medicine

## 2015-11-13 VITALS — BP 102/58 | HR 73 | Temp 98.6°F | Wt 179.2 lb

## 2015-11-13 DIAGNOSIS — R7309 Other abnormal glucose: Secondary | ICD-10-CM

## 2015-11-13 DIAGNOSIS — A419 Sepsis, unspecified organism: Secondary | ICD-10-CM

## 2015-11-13 DIAGNOSIS — J189 Pneumonia, unspecified organism: Secondary | ICD-10-CM

## 2015-11-13 DIAGNOSIS — Z5189 Encounter for other specified aftercare: Secondary | ICD-10-CM | POA: Diagnosis not present

## 2015-11-13 LAB — CBC WITH DIFFERENTIAL/PLATELET
Basophils Absolute: 0.1 10*3/uL (ref 0.0–0.1)
Basophils Relative: 1 % (ref 0–1)
Eosinophils Absolute: 0.2 10*3/uL (ref 0.0–0.7)
Eosinophils Relative: 2 % (ref 0–5)
HCT: 41.7 % (ref 36.0–46.0)
Hemoglobin: 14 g/dL (ref 12.0–15.0)
Lymphocytes Relative: 34 % (ref 12–46)
Lymphs Abs: 3.1 10*3/uL (ref 0.7–4.0)
MCH: 29.8 pg (ref 26.0–34.0)
MCHC: 33.6 g/dL (ref 30.0–36.0)
MCV: 88.7 fL (ref 78.0–100.0)
MPV: 9.5 fL (ref 8.6–12.4)
Monocytes Absolute: 0.7 10*3/uL (ref 0.1–1.0)
Monocytes Relative: 8 % (ref 3–12)
Neutro Abs: 5.1 10*3/uL (ref 1.7–7.7)
Neutrophils Relative %: 55 % (ref 43–77)
Platelets: 292 10*3/uL (ref 150–400)
RBC: 4.7 MIL/uL (ref 3.87–5.11)
RDW: 14.7 % (ref 11.5–15.5)
WBC: 9.2 10*3/uL (ref 4.0–10.5)

## 2015-11-13 NOTE — Patient Instructions (Addendum)
Hold the lisinopril for the next 3 days.  Update me Monday.   Go to the lab on the way out.  We'll contact you with your lab report.  Recheck A1c in about 3 months.   Recheck CXR in about 10 days.   Take care.   Glad to see you.

## 2015-11-13 NOTE — Progress Notes (Signed)
Pre visit review using our clinic review tool, if applicable. No additional management support is needed unless otherwise documented below in the visit note.   Admit date: 10/27/2015 Discharge date: 11/01/2015  Recommendations for Outpatient Follow-up:  1. Pt will need to follow up with PCP in 2 weeks post discharge 2. Please obtain BMP and CBC in one week 3. Please follow up with pt regarding HbA1C 7.2--pt wishes to defer meds until she speaks with Dr. Damita Dunnings  Discharge Diagnoses:  Sepsis -Present the time of admission -Secondary to pneumonia -Lactic acid 3.16-->0.9 -Continue levofloxacin and vancomycin -blood cultures neg to date -Urine legionella antigen, urine Streptococcus pneumoniae antigen negative -Restart IV fluids -Due to persistent fever or intermitten abdominal pain, obtain CT abdomen -CT abd/pelvis--no acute findings aside from multifocal pneumonia right greater than left lower lobes with trace pleural effusions. -ESR 108 -CRP 26.0 Community-acquired pneumonia -pt had prolonged febrile response and extensive work up for FUO -Chest x-ray revealed right lower lobe opacity -Influenza PCR negative -Viral respiratory panel--neg -Continue antibiotics pending culture data -continue levoflox D#6, discontinued vanco 2/18 -Home with levofloxacin for 2 additional days to complete 8 days of therapy. -all blood cultures neg -10/31/15--CT abdomen and pelvis--sigmoid diverticulosis without other acute intra-abdominal findings -10/29/2015 CT of chest-- RLL consolidation without any effusion or abscess -Fortunately, the patient remained afebrile for over 24 hours, and she was discharged in stable condition on 11/01/2015 Hypertension -Continue atenolol and lisinopril -Controlled Depression -Continue Celexa Headache -likely toxic headache due to fever, infection -doubt CNS infection--HA improves with improving fever -neg Kernig/Brudzinski's -symptomatic treatment History of  Reiter's syndrome -Check complements--C3--176, C4--39 -Chek ANA--pending Diabetes mellitus type 2 -Hemoglobin A1c 7.2 -Continue NovoLog sliding scale -Patient states that she used to take metformin until she stopped one year ago when she lost Weight. Now that she has gained weight back, she has noticed that her CBGs have been more elevated. She wishes to discuss with her primary care provider before restarting any medications    Discharge Condition: stable  Disposition: home  Diet:carb modified Wt Readings from Last 3 Encounters:  10/31/15 83.5 kg (184 lb 1.4 oz)  10/26/15 82.328 kg (181 lb 8 oz)  09/24/15 80.65 kg (177 lb 12.8 oz)    History of present illness:  65 year old female with history of hypertension, fibromyalgia, hepatic steatosis presented with 2 day history of worsening coughing, myalgias and arthralgias, fevers, and associated nausea and vomiting. The patient states that she has had URI type symptoms since December 2016. She had been treated with 2 courses of doxycycline during which she had some transient improvement. On 10/26/2015, the patient had a fever of 104.51F at home with the above constellation of symptoms which pondered her presentation to the emergency department. Lactic acid peaked at 3.16. The patient was started on vancomycin and levofloxacin. The patient had prolonged febrile response with extensive workup for fever of unknown origin. Fortunately, everything was negative. Blood cultures remain negative and the patient was hemodynamically stable. Fortunately, the patient subsequently defervesced for over 24 hours prior to discharge.     The above d/w pt.  Hospital course d/w pt.  Admitted with cough and PNA with need for f/u CXR in about 10 days, due for flu labs today.  She tried to go back to work and was really fatigued in the meantime.  Cough is much better, minimal sputum.  occ lightheaded but overall improved.  D/w pt about BP meds and fluid  intake.  No fevers now.  No other  new sx.  No FCNAVD.   Meds, vitals, and allergies reviewed.   ROS: See HPI.  Otherwise, noncontributory.  GEN: nad, alert and oriented, she looks tired but in NAD.  HEENT: mucous membranes moist NECK: supple w/o LA CV: rrr.  no murmur PULM: ctab, no inc wob ABD: soft, +bs EXT: no edema SKIN: no acute rash

## 2015-11-14 LAB — BASIC METABOLIC PANEL
BUN: 15 mg/dL (ref 7–25)
CO2: 27 mmol/L (ref 20–31)
Calcium: 9.6 mg/dL (ref 8.6–10.4)
Chloride: 100 mmol/L (ref 98–110)
Creat: 0.73 mg/dL (ref 0.50–0.99)
Glucose, Bld: 153 mg/dL — ABNORMAL HIGH (ref 65–99)
Potassium: 4.6 mmol/L (ref 3.5–5.3)
Sodium: 139 mmol/L (ref 135–146)

## 2015-11-15 NOTE — Assessment & Plan Note (Signed)
Much improved.  Continue off resp abx for now, with repeat labs pending.  Recheck CXR in about 10 days is reasonable, d/w pt.  Continue prn SABA, rest and out of work for now.  Note done for work.  Incidental elevation in A1c and she'll work on that with diet and exercise as tolerate and we can recheck later on 2017.  She agrees.  Okay for outpatient f/u.

## 2015-11-16 ENCOUNTER — Telehealth: Payer: Self-pay

## 2015-11-16 NOTE — Telephone Encounter (Signed)
Pt called and pt is feeling well today. Pt stopped lisinopril on 11/13/15 as instructed; today BP 138/83. No CP or SOB. Pt had H/A earlier today but no h/a now and pt had dizziness x 1 when raised up too fast.  Pt plans to work 1/2 day on 11/17/15. Pt request cb about what to do about taking lisinopril.

## 2015-11-16 NOTE — Telephone Encounter (Signed)
I would stay off the lisinopril until she isn't lightheaded on standing.   If her BP is consistently >140/>90 at that point, then restart.  Glad she is doing some better.  Thanks.

## 2015-11-16 NOTE — Telephone Encounter (Signed)
Patient advised.

## 2015-11-19 NOTE — Telephone Encounter (Signed)
Approval letter received, good thru November 11, 2016 provided remaining enrolled in the plan.  Letter faxed to [facility].

## 2016-01-07 ENCOUNTER — Ambulatory Visit (INDEPENDENT_AMBULATORY_CARE_PROVIDER_SITE_OTHER)
Admission: RE | Admit: 2016-01-07 | Discharge: 2016-01-07 | Disposition: A | Discharge status: Home / Self Care | Payer: Medicare Other | Source: Ambulatory Visit | Attending: Family Medicine | Admitting: Family Medicine

## 2016-01-07 DIAGNOSIS — J189 Pneumonia, unspecified organism: Secondary | ICD-10-CM | POA: Diagnosis not present

## 2016-01-08 ENCOUNTER — Encounter: Payer: Self-pay | Admitting: Family Medicine

## 2016-01-08 ENCOUNTER — Ambulatory Visit (INDEPENDENT_AMBULATORY_CARE_PROVIDER_SITE_OTHER): Payer: Medicare Other | Admitting: Family Medicine

## 2016-01-08 VITALS — BP 140/88 | HR 64 | Temp 98.7°F | Ht 66.5 in | Wt 177.0 lb

## 2016-01-08 DIAGNOSIS — R202 Paresthesia of skin: Secondary | ICD-10-CM

## 2016-01-08 DIAGNOSIS — R21 Rash and other nonspecific skin eruption: Secondary | ICD-10-CM

## 2016-01-08 DIAGNOSIS — J189 Pneumonia, unspecified organism: Secondary | ICD-10-CM | POA: Diagnosis not present

## 2016-01-08 DIAGNOSIS — Z87440 Personal history of urinary (tract) infections: Secondary | ICD-10-CM

## 2016-01-08 DIAGNOSIS — R300 Dysuria: Secondary | ICD-10-CM

## 2016-01-08 DIAGNOSIS — G4733 Obstructive sleep apnea (adult) (pediatric): Secondary | ICD-10-CM

## 2016-01-08 LAB — POC URINALSYSI DIPSTICK (AUTOMATED)
Bilirubin, UA: NEGATIVE
Blood, UA: NEGATIVE
Glucose, UA: NEGATIVE
Ketones, UA: NEGATIVE
Leukocytes, UA: NEGATIVE
Nitrite, UA: NEGATIVE
Protein, UA: NEGATIVE
Spec Grav, UA: 1.025
Urobilinogen, UA: NEGATIVE
pH, UA: 6

## 2016-01-08 MED ORDER — FLUTICASONE PROPIONATE 50 MCG/ACT NA SUSP: 2.0000 | Freq: Every day | NASAL | Status: DC

## 2016-01-08 NOTE — Patient Instructions (Addendum)
Restart flonase.   We'll contact you with your lab report. Take care.  Glad to see you.

## 2016-01-08 NOTE — Progress Notes (Signed)
Dysuria: no Her sig other had presumed UTI.  She is on macrobid at baseline.   She was advised to come in for eval.   abdominal pain:no fevers:no back pain:no Vomiting:no No atypical discharge.    Waking at night with cough, after using CPAP.  Unclear if from dry throat.  Has humidifier on the cpap.  We weight is down with intentional weight loss and her sx are better now.  It may have been a temporary mismatch on her settings when her weight was higher.  D/w pt.   CXR results d/w pt.  No daytime cough of sputum.  No fevers.    Neck pain, sore with ROM, longstanding.  With some numbness in the R 4th and 5th fingers but no weakness.    Rash on B arms and upper chest.  More springtime allergy sx in general.  Improved with topical steroid.  It was itchy, better with topical tx- hydrocortisone.  More rhinorrhea and stuffy recently.    Still with some hot flashes.  Returned over the last 6 months.  She didn't have a trigger to attribute them to.  Not very bothersome per patient, she can tolerate.  No night sweats.    Meds, vitals, and allergies reviewed.   ROS: Per HPI unless specifically indicated in ROS section   GEN: nad, alert and oriented HEENT: mucous membranes moist NECK: supple- not stiff- but with some tightness on B paraspinal muscles w/o midline pain.  CV: rrr.  PULM: ctab, no inc wob ABD: soft, +bs, suprapubic area not tender EXT: no edema SKIN: faint blanching irregular maculopapular rash on the B arms and upper mid chest, near the upper sternum.  No ulceration.  BACK: no CVA pain

## 2016-01-10 DIAGNOSIS — Z87440 Personal history of urinary (tract) infections: Secondary | ICD-10-CM | POA: Insufficient documentation

## 2016-01-10 DIAGNOSIS — R21 Rash and other nonspecific skin eruption: Secondary | ICD-10-CM | POA: Insufficient documentation

## 2016-01-10 LAB — URINE CULTURE: Colony Count: 95000

## 2016-01-10 NOTE — Assessment & Plan Note (Signed)
On suppressive tx, check cx, no sx at this point.  D/w pt.  Okay for outpatient f/u.

## 2016-01-10 NOTE — Assessment & Plan Note (Signed)
No weakness on exam, d/w pt. Given her neck sx, then would be reasonable for local tx of neck (stretching, heat, ice) and update [place] prn. No weakness.  She agrees.  Okay to try massage/chiropractor for her neck.

## 2016-01-10 NOTE — Assessment & Plan Note (Signed)
Likely environmental allergy/trigger, no sign of ominous process, continue OTC topical tx since some better in the meantime.  She agrees.

## 2016-01-10 NOTE — Assessment & Plan Note (Signed)
Would consider this resolved, CXR d/w pt.  >25 minutes spent in face to face time with patient, >50% spent in counselling or coordination of care.

## 2016-01-10 NOTE — Assessment & Plan Note (Signed)
Now improved with weight loss.

## 2016-01-25 ENCOUNTER — Emergency Department (HOSPITAL_COMMUNITY): Payer: Medicare Other

## 2016-01-25 ENCOUNTER — Inpatient Hospital Stay (HOSPITAL_COMMUNITY): Payer: Medicare Other

## 2016-01-25 ENCOUNTER — Encounter (HOSPITAL_COMMUNITY): Payer: Self-pay | Admitting: Emergency Medicine

## 2016-01-25 ENCOUNTER — Inpatient Hospital Stay (HOSPITAL_COMMUNITY)
Admission: EM | Admit: 2016-01-25 | Discharge: 2016-01-28 | DRG: 066 | Disposition: A | Discharge status: Home / Self Care | Payer: Medicare Other | Attending: Family Medicine | Admitting: Family Medicine

## 2016-01-25 ENCOUNTER — Telehealth: Payer: Self-pay | Admitting: Family Medicine

## 2016-01-25 DIAGNOSIS — Z90710 Acquired absence of both cervix and uterus: Secondary | ICD-10-CM

## 2016-01-25 DIAGNOSIS — Z87440 Personal history of urinary (tract) infections: Secondary | ICD-10-CM | POA: Diagnosis present

## 2016-01-25 DIAGNOSIS — R4182 Altered mental status, unspecified: Secondary | ICD-10-CM | POA: Insufficient documentation

## 2016-01-25 DIAGNOSIS — E119 Type 2 diabetes mellitus without complications: Secondary | ICD-10-CM | POA: Diagnosis not present

## 2016-01-25 DIAGNOSIS — I10 Essential (primary) hypertension: Secondary | ICD-10-CM | POA: Diagnosis present

## 2016-01-25 DIAGNOSIS — Z7951 Long term (current) use of inhaled steroids: Secondary | ICD-10-CM | POA: Diagnosis not present

## 2016-01-25 DIAGNOSIS — E039 Hypothyroidism, unspecified: Secondary | ICD-10-CM | POA: Diagnosis present

## 2016-01-25 DIAGNOSIS — Z823 Family history of stroke: Secondary | ICD-10-CM

## 2016-01-25 DIAGNOSIS — Z833 Family history of diabetes mellitus: Secondary | ICD-10-CM

## 2016-01-25 DIAGNOSIS — E1165 Type 2 diabetes mellitus with hyperglycemia: Secondary | ICD-10-CM | POA: Diagnosis present

## 2016-01-25 DIAGNOSIS — Z803 Family history of malignant neoplasm of breast: Secondary | ICD-10-CM

## 2016-01-25 DIAGNOSIS — Z818 Family history of other mental and behavioral disorders: Secondary | ICD-10-CM | POA: Diagnosis not present

## 2016-01-25 DIAGNOSIS — Z8249 Family history of ischemic heart disease and other diseases of the circulatory system: Secondary | ICD-10-CM

## 2016-01-25 DIAGNOSIS — M1990 Unspecified osteoarthritis, unspecified site: Secondary | ICD-10-CM | POA: Diagnosis present

## 2016-01-25 DIAGNOSIS — Z811 Family history of alcohol abuse and dependence: Secondary | ICD-10-CM | POA: Diagnosis not present

## 2016-01-25 DIAGNOSIS — K760 Fatty (change of) liver, not elsewhere classified: Secondary | ICD-10-CM | POA: Diagnosis present

## 2016-01-25 DIAGNOSIS — Z87891 Personal history of nicotine dependence: Secondary | ICD-10-CM

## 2016-01-25 DIAGNOSIS — Z7982 Long term (current) use of aspirin: Secondary | ICD-10-CM | POA: Diagnosis not present

## 2016-01-25 DIAGNOSIS — Z801 Family history of malignant neoplasm of trachea, bronchus and lung: Secondary | ICD-10-CM | POA: Diagnosis not present

## 2016-01-25 DIAGNOSIS — E785 Hyperlipidemia, unspecified: Secondary | ICD-10-CM | POA: Diagnosis present

## 2016-01-25 DIAGNOSIS — F329 Major depressive disorder, single episode, unspecified: Secondary | ICD-10-CM | POA: Diagnosis present

## 2016-01-25 DIAGNOSIS — I639 Cerebral infarction, unspecified: Secondary | ICD-10-CM | POA: Diagnosis present

## 2016-01-25 DIAGNOSIS — Z8261 Family history of arthritis: Secondary | ICD-10-CM

## 2016-01-25 DIAGNOSIS — K219 Gastro-esophageal reflux disease without esophagitis: Secondary | ICD-10-CM | POA: Diagnosis present

## 2016-01-25 DIAGNOSIS — E1169 Type 2 diabetes mellitus with other specified complication: Secondary | ICD-10-CM | POA: Diagnosis present

## 2016-01-25 DIAGNOSIS — I6789 Other cerebrovascular disease: Secondary | ICD-10-CM | POA: Diagnosis not present

## 2016-01-25 LAB — CBC
HCT: 41.6 % (ref 36.0–46.0)
Hemoglobin: 13.6 g/dL (ref 12.0–15.0)
MCH: 28.9 pg (ref 26.0–34.0)
MCHC: 32.7 g/dL (ref 30.0–36.0)
MCV: 88.3 fL (ref 78.0–100.0)
Platelets: 184 10*3/uL (ref 150–400)
RBC: 4.71 MIL/uL (ref 3.87–5.11)
RDW: 13.9 % (ref 11.5–15.5)
WBC: 6.6 10*3/uL (ref 4.0–10.5)

## 2016-01-25 LAB — DIFFERENTIAL
Basophils Absolute: 0 10*3/uL (ref 0.0–0.1)
Basophils Relative: 1 %
Eosinophils Absolute: 0.1 10*3/uL (ref 0.0–0.7)
Eosinophils Relative: 2 %
Lymphocytes Relative: 36 %
Lymphs Abs: 2.4 10*3/uL (ref 0.7–4.0)
Monocytes Absolute: 0.6 10*3/uL (ref 0.1–1.0)
Monocytes Relative: 8 %
Neutro Abs: 3.5 10*3/uL (ref 1.7–7.7)
Neutrophils Relative %: 53 %

## 2016-01-25 LAB — I-STAT CHEM 8, ED
BUN: 17 mg/dL (ref 6–20)
Calcium, Ion: 1.17 mmol/L (ref 1.13–1.30)
Chloride: 107 mmol/L (ref 101–111)
Creatinine, Ser: 0.7 mg/dL (ref 0.44–1.00)
Glucose, Bld: 194 mg/dL — ABNORMAL HIGH (ref 65–99)
HCT: 43 % (ref 36.0–46.0)
Hemoglobin: 14.6 g/dL (ref 12.0–15.0)
Potassium: 4.4 mmol/L (ref 3.5–5.1)
Sodium: 139 mmol/L (ref 135–145)
TCO2: 22 mmol/L (ref 0–100)

## 2016-01-25 LAB — COMPREHENSIVE METABOLIC PANEL
ALT: 27 U/L (ref 14–54)
AST: 27 U/L (ref 15–41)
Albumin: 3.7 g/dL (ref 3.5–5.0)
Alkaline Phosphatase: 103 U/L (ref 38–126)
Anion gap: 9 (ref 5–15)
BUN: 15 mg/dL (ref 6–20)
CO2: 22 mmol/L (ref 22–32)
Calcium: 9.5 mg/dL (ref 8.9–10.3)
Chloride: 106 mmol/L (ref 101–111)
Creatinine, Ser: 0.77 mg/dL (ref 0.44–1.00)
GFR calc Af Amer: >60 mL/min (ref >60)
GFR calc non Af Amer: >60 mL/min (ref >60)
Glucose, Bld: 200 mg/dL — ABNORMAL HIGH (ref 65–99)
Potassium: 4.4 mmol/L (ref 3.5–5.1)
Sodium: 137 mmol/L (ref 135–145)
Total Bilirubin: 0.6 mg/dL (ref 0.3–1.2)
Total Protein: 6.5 g/dL (ref 6.5–8.1)

## 2016-01-25 LAB — I-STAT TROPONIN, ED: Troponin i, poc: 0 ng/mL (ref 0.00–0.08)

## 2016-01-25 LAB — APTT: aPTT: 29 seconds (ref 24–37)

## 2016-01-25 LAB — GLUCOSE, CAPILLARY: Glucose-Capillary: 122 mg/dL — ABNORMAL HIGH (ref 65–99)

## 2016-01-25 LAB — PROTIME-INR
INR: 1.12 (ref 0.00–1.49)
Prothrombin Time: 14.6 seconds (ref 11.6–15.2)

## 2016-01-25 LAB — CBG MONITORING, ED: Glucose-Capillary: 185 mg/dL — ABNORMAL HIGH (ref 65–99)

## 2016-01-25 MED ORDER — CALCIUM-VITAMIN D 600-200 MG-UNIT PO TABS: 1.0000 | ORAL_TABLET | Freq: Two times a day (BID) | ORAL | Status: DC

## 2016-01-25 MED ORDER — ALBUTEROL SULFATE (2.5 MG/3ML) 0.083% IN NEBU: 3.0000 mL | INHALATION_SOLUTION | Freq: Four times a day (QID) | RESPIRATORY_TRACT | Status: DC | PRN

## 2016-01-25 MED ORDER — ENOXAPARIN SODIUM 40 MG/0.4ML ~~LOC~~ SOLN
40.0000 mg | SUBCUTANEOUS | Status: DC
Start: 2016-01-25 — End: 2016-01-28
  Administered 2016-01-25 – 2016-01-27 (×3): 40 mg via SUBCUTANEOUS
  Filled 2016-01-25 (×3): qty 0.4

## 2016-01-25 MED ORDER — CALCIUM CARBONATE-VITAMIN D 500-200 MG-UNIT PO TABS
1.0000 | ORAL_TABLET | Freq: Two times a day (BID) | ORAL | Status: DC
  Administered 2016-01-25 – 2016-01-28 (×6): 1 via ORAL
  Filled 2016-01-25 (×6): qty 1

## 2016-01-25 MED ORDER — PANTOPRAZOLE SODIUM 40 MG PO TBEC
40.0000 mg | DELAYED_RELEASE_TABLET | Freq: Every day | ORAL | Status: DC
  Administered 2016-01-26 – 2016-01-28 (×3): 40 mg via ORAL
  Filled 2016-01-25 (×3): qty 1

## 2016-01-25 MED ORDER — ATENOLOL 50 MG PO TABS
50.0000 mg | ORAL_TABLET | Freq: Every day | ORAL | Status: DC
  Administered 2016-01-26 – 2016-01-28 (×3): 50 mg via ORAL
  Filled 2016-01-25 (×3): qty 1

## 2016-01-25 MED ORDER — FLUTICASONE PROPIONATE 50 MCG/ACT NA SUSP
2.0000 | Freq: Every day | NASAL | Status: DC
Start: 2016-01-26 — End: 2016-01-28
  Administered 2016-01-26 – 2016-01-28 (×3): 2 via NASAL
  Filled 2016-01-25: qty 16

## 2016-01-25 MED ORDER — ASPIRIN EC 325 MG PO TBEC
325.0000 mg | DELAYED_RELEASE_TABLET | Freq: Every day | ORAL | Status: DC
  Administered 2016-01-25 – 2016-01-28 (×4): 325 mg via ORAL
  Filled 2016-01-25 (×4): qty 1

## 2016-01-25 MED ORDER — CITALOPRAM HYDROBROMIDE 20 MG PO TABS
20.0000 mg | ORAL_TABLET | Freq: Two times a day (BID) | ORAL | Status: DC
  Administered 2016-01-25 – 2016-01-28 (×6): 20 mg via ORAL
  Filled 2016-01-25 (×6): qty 1

## 2016-01-25 MED ORDER — STROKE: EARLY STAGES OF RECOVERY BOOK
Freq: Once | Status: AC
  Administered 2016-01-25: 1
  Filled 2016-01-25: qty 1

## 2016-01-25 MED ORDER — INSULIN ASPART 100 UNIT/ML ~~LOC~~ SOLN: 0.0000 [IU] | Freq: Every day | SUBCUTANEOUS | Status: DC

## 2016-01-25 MED ORDER — NITROFURANTOIN MACROCRYSTAL 100 MG PO CAPS
100.0000 mg | ORAL_CAPSULE | Freq: Every day | ORAL | Status: DC
  Administered 2016-01-25 – 2016-01-28 (×4): 100 mg via ORAL
  Filled 2016-01-25 (×4): qty 1

## 2016-01-25 MED ORDER — INSULIN ASPART 100 UNIT/ML ~~LOC~~ SOLN
0.0000 [IU] | Freq: Three times a day (TID) | SUBCUTANEOUS | Status: DC
  Administered 2016-01-26 (×2): 1 [IU] via SUBCUTANEOUS
  Administered 2016-01-27: 2 [IU] via SUBCUTANEOUS
  Administered 2016-01-28 (×2): 1 [IU] via SUBCUTANEOUS

## 2016-01-25 MED ORDER — LORATADINE 10 MG PO TABS
10.0000 mg | ORAL_TABLET | Freq: Every day | ORAL | Status: DC
  Administered 2016-01-25 – 2016-01-28 (×4): 10 mg via ORAL
  Filled 2016-01-25 (×4): qty 1

## 2016-01-25 MED ORDER — ADULT MULTIVITAMIN W/MINERALS CH
1.0000 | ORAL_TABLET | Freq: Every day | ORAL | Status: DC
  Administered 2016-01-26 – 2016-01-28 (×3): 1 via ORAL
  Filled 2016-01-25 (×4): qty 1

## 2016-01-25 MED ORDER — VITAMIN B-12 1000 MCG PO TABS
1000.0000 ug | ORAL_TABLET | Freq: Every day | ORAL | Status: DC
  Administered 2016-01-26 – 2016-01-28 (×3): 1000 ug via ORAL
  Filled 2016-01-25 (×3): qty 1

## 2016-01-25 MED ORDER — LISINOPRIL 10 MG PO TABS
10.0000 mg | ORAL_TABLET | Freq: Every day | ORAL | Status: DC
  Administered 2016-01-25 – 2016-01-27 (×3): 10 mg via ORAL
  Filled 2016-01-25 (×3): qty 1

## 2016-01-25 NOTE — Telephone Encounter (Signed)
Patient Name: Allison Yoder  DOB: [birth year]    Initial Comment Caller states she may have had a TIA yesterday morning. She didn't know her birth date. How old she was. Month or year it was. It only lasted a few minutes. She has had a headache since.   Nurse Assessment  Nurse: Leilani Merl, RN, Heather Date/Time (Eastern Time): 01/25/2016 9:13:14 AM  Confirm and document reason for call. If symptomatic, describe symptoms. You must click the next button to save text entered. ---Caller states she may have had a TIA yesterday morning. She didn't know her birth date. How old she was. Month or year it was. It only lasted a few minutes. She has had a headache since  Has the patient traveled out of the country within the last 30 days? ---Not Applicable  Does the patient have any new or worsening symptoms? ---Yes  Will a triage be completed? ---Yes  Related visit to physician within the last 2 weeks? ---No  Does the PT have any chronic conditions? (i.e. diabetes, asthma, etc.) ---Yes  List chronic conditions. ---See MR  Is this a behavioral health or substance abuse call? ---No     Guidelines    Guideline Title Affirmed Question Affirmed Notes  Confusion - Delirium Headache or vomiting    Final Disposition User   Go to ED Now Standifer, RN, [facility] - ED   Disagree/Comply: Comply

## 2016-01-25 NOTE — Progress Notes (Signed)
TRACYE FRANEY [id number] Admission Data: 01/25/2016 5:20 PM Attending Provider: Waldemar Dickens, MD  [id number] Damita Dunnings, MD Consults/ Treatment Team:    Allison Yoder is a 65 y.o. female patient admitted from ED awake, alert  & orientated  X 3,  Full Code, VSS - Blood pressure 124/67, pulse 75, temperature 99.4 F (37.4 C), temperature source Oral, resp. rate 18, SpO2 98 %.,  nasal cannular, no c/o shortness of breath, no c/o chest pain, no distress noted. Tele # 13 placed.   IV site WDL: Left forearm SL.  Allergies:   Allergies  Allergen Reactions  . Cephalexin Itching  . Codeine Itching  . Inapsine [Droperidol] Shortness Of Breath, Anxiety and Hypertension    Elevated HR and BP, panic attack  . Lipitor [Atorvastatin Calcium]     Myalgias   . Niacin And Related     Flushing  . Pravastatin     Myalgias  . Adhesive [Tape] Rash    Blisters with tape     Past Medical History  Diagnosis Date  . GERD (gastroesophageal reflux disease)   . Allergy   . Hypertension   . Hyperlipidemia   . History of chicken pox   . Urinary incontinence   . Recurrent urinary tract infection   . Iritis     per [facility]  . Diverticulosis   . Fatty liver   . Snores   . Wears glasses   . Anxiety   . Depression   . Heart murmur     "dx'd years ago; very mild; never treated" (10/27/2015)  . OSA on CPAP   . Pneumonia ~ 2013; 10/27/2015  . Hypothyroidism 2002-~ 2010  . Type II diabetes mellitus ([place])     "lost weight; started exercising again; don't have it anymore" (10/27/2015)  . Anemia     "all the time as a child"  . Migraines     "stopped when I went thru menopause"  . Arthritis     "all my joints; worse in my hands" (10/27/2015)  . Chronic lower back pain      Pt orientation to unit, room and routine. Information packet given to patient/family and safety video watched.  Admission INP armband ID verified with patient/family, and in place. SR up x 2, fall risk assessment  complete with Patient and family verbalizing understanding of risks associated with falls. Pt verbalizes an understanding of how to use the call bell and to call for help before getting out of bed.  Skin, clean-dry- intact without evidence of bruising, or skin tears.   No evidence of skin break down noted on exam.     Will cont to monitor and assist as needed.  Dayle Points, RN 01/25/2016 5:20 PM

## 2016-01-25 NOTE — H&P (Signed)
History and Physical    Allison Yoder [id number] DOB: 09-08-1951 DOA: 01/25/2016   PCP: Elsie Stain, MD   Patient coming from/Resides with: Private residence, lives with her other  Chief Complaint: Transient altered mental status  HPI: Allison Yoder is a 65 y.o. female with medical history significant for recurrent UTIs on Macrodantin, dyslipidemia not on statin, hypertension on medication who presented to the ER with reports of transient altered mental status this past Saturday evening 5/13 when watching TV. She reports symptoms only lasted a few minutes and consisted of a dreamlike sensation without any focal motor neurological deficits or any sensory deficits such as numbness or tingling in any of her extremities or her face. During the episode she was unable to recall her age, her significant other's, her or her children's age. She was also having difficulty knowing exactly what the month and year was. Symptoms rapidly resolved and patient has remained at a normal baseline since that time. She has had a slight nagging headache since that time. She has history of diet-controlled diabetes hypertension and dyslipidemia. Because of the symptoms she decided to seek treatment in the ER.  ED Course:  PO to return on a 98.4-BP 120/80-pulse 73-respirations 18-room air saturations 98%. CT head without contrast: Mild atrophy without acute and cardiac process MRI brain without contrast: 2 small areas of acute infarct in the left middle and tail of the hippocampus each measuring approximately 3 mm Lab data: Sodium 137, potassium 4.4, BUN 15, creatinine 0.77, glucose 200, LFTs normal, troponin 0.00, WBC 6600 with normal differential, hemoglobin 13.6, platelets 184,000   Review of Systems:  In addition to the HPI above,  No Fever-chills, myalgias or other constitutional symptoms No changes with Vision or hearing, new weakness, tingling, numbness in any extremity No problems swallowing food or  Liquids, indigestion/reflux No Chest pain, Cough or Shortness of Breath, palpitations, orthopnea or DOE No Abdominal pain, N/V; no melena or hematochezia, no dark tarry stools, Bowel movements are regular, No dysuria, hematuria or flank pain No new skin rashes, lesions, masses or bruises, No new joints pains-aches No recent weight gain or loss No polyuria, polydypsia or polyphagia,   Past Medical History  Diagnosis Date  . GERD (gastroesophageal reflux disease)   . Allergy   . Hypertension   . Hyperlipidemia   . History of chicken pox   . Urinary incontinence   . Recurrent urinary tract infection   . Iritis     per [facility]  . Diverticulosis   . Fatty liver   . Snores   . Wears glasses   . Anxiety   . Depression   . Heart murmur     "dx'd years ago; very mild; never treated" (10/27/2015)  . OSA on CPAP   . Pneumonia ~ 2013; 10/27/2015  . Hypothyroidism 2002-~ 2010  . Type II diabetes mellitus ([place])     "lost weight; started exercising again; don't have it anymore" (10/27/2015)  . Anemia     "all the time as a child"  . Migraines     "stopped when I went thru menopause"  . Arthritis     "all my joints; worse in my hands" (10/27/2015)  . Chronic lower back pain     Past Surgical History  Procedure Laterality Date  . Pelvic floor repair  2001    "lift"  . Polypectomy  X 3    "bladder"  . Shoulder arthroscopy w/ rotator cuff repair Left 2013  .  Shoulder arthroscopy w/ rotator cuff repair Right 2015  . Hemorrhoid banding  X 2  . Breast lumpectomy Right 1964    benign tumor,  . Vaginal hysterectomy  1992    Partial  . Tubal ligation  ~ 1986  . Knee arthroscopy Left 2016    "meniscus repair"  . Blepharoplasty Bilateral 2013; 2016     reports that she quit smoking about 30 years ago. Her smoking use included Cigarettes. She has a 10 pack-year smoking history. She has never used smokeless tobacco. She reports that she drinks about 1.2 oz of alcohol per  week. She reports that she does not use illicit drugs.  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 Cephalexin Itching  . Codeine Itching  . Inapsine [Droperidol] Shortness Of Breath, Anxiety and Hypertension    Elevated HR and BP, panic attack  . Lipitor [Atorvastatin Calcium]     Myalgias   . Niacin And Related     Flushing  . Pravastatin     Myalgias  . Adhesive [Tape] Rash    Blisters with tape    Family History  Problem Relation Age of Onset  . Arthritis Maternal Grandmother   . Stroke Maternal Grandmother   . Diabetes Maternal Grandmother   . Heart disease Paternal Uncle     x 2  . Arthritis Sister   . Breast cancer Cousin   . Heart disease Paternal Aunt   . Diabetes Paternal Uncle   . Arthritis Mother   . Heart disease Mother   . Hyperlipidemia Mother   . Hypertension Mother   . Diabetes Mother   . Depression Mother   . Alcohol abuse Father   . Lung cancer Father   . Diabetes Sister   . Heart disease Paternal Uncle     x 5  . Heart disease Paternal Aunt     x 3  . Stroke Paternal Aunt   . Colon cancer Neg Hx      Prior to Admission medications   Medication Sig Start Date End Date Taking? Authorizing Provider  albuterol (PROVENTIL HFA;VENTOLIN HFA) 108 (90 Base) MCG/ACT inhaler Inhale 1-2 puffs into the lungs every 6 (six) hours as needed for wheezing or shortness of breath. 10/26/15  Yes Tonia Ghent, MD  atenolol (TENORMIN) 50 MG tablet Take 1 tablet (50 mg total) by mouth daily. 04/28/15  Yes Tonia Ghent, MD  Calcium-Vitamin D 600-200 MG-UNIT per tablet Take 1 tablet by mouth 2 (two) times daily.   Yes Historical Provider, MD  cetirizine (ZYRTEC) 10 MG tablet Take 10 mg by mouth daily as needed for allergies.    Yes Historical Provider, MD  citalopram (CELEXA) 20 MG tablet Take 1 tablet (20 mg total) by mouth 2 (two) times daily. 11/01/15  Yes Orson Eva,  MD  fluticasone (FLONASE) 50 MCG/ACT nasal spray Place 2 sprays into both nostrils daily. 01/08/16  Yes Tonia Ghent, MD  lisinopril (PRINIVIL,ZESTRIL) 10 MG tablet Take 1 tablet (10 mg total) by mouth at bedtime. 04/28/15  Yes Tonia Ghent, MD  Multiple Vitamin (MULTIVITAMIN) tablet Take 1 tablet by mouth daily.   Yes Historical Provider, MD  nitrofurantoin (MACRODANTIN) 100 MG capsule Take 100 mg by mouth daily.    Yes Historical Provider, MD  omeprazole (PRILOSEC) 20 MG capsule Take 20 mg by mouth daily.   Yes Historical Provider, MD  vitamin B-12 (CYANOCOBALAMIN)  1000 MCG tablet Take 1,000 mcg by mouth daily.   Yes Historical Provider, MD    Physical Exam: Filed Vitals:   01/25/16 1538 01/25/16 1547 01/25/16 1615 01/25/16 1630  BP: 136/102  119/68 124/67  Pulse: 59  79 75  Temp:  99.4 F (37.4 C)    TempSrc:      Resp: 17  19 18   SpO2: 99%  97% 98%      Constitutional: NAD, calm, comfortable Eyes: PERRL, lids and conjunctivae normal ENMT: Mucous membranes are moist. Posterior pharynx clear of any exudate or lesions.Normal dentition.  Neck: normal, supple, no masses, no thyromegaly Respiratory: clear to auscultation bilaterally, no wheezing, no crackles. Normal respiratory effort. No accessory muscle use.  Cardiovascular: Regular rate and rhythm, no murmurs / rubs / gallops. No extremity edema. 2+ pedal pulses. No carotid bruits.  Abdomen: no tenderness, no masses palpated. No hepatosplenomegaly. Bowel sounds positive.  Musculoskeletal: no clubbing / cyanosis. No joint deformity upper and lower extremities. Good ROM, no contractures. Normal muscle tone.  Skin: no rashes, lesions, ulcers. No induration Neurologic: CN 2-12 grossly intact. Sensation intact, DTR normal. Strength 5/5 x all 4 extremities.  Psychiatric: Normal judgment and insight. Alert and oriented x 3. Normal mood.    Labs on Admission: I have personally reviewed following labs and imaging  studies  CBC:  Recent Labs Lab 01/25/16 1041 01/25/16 1048  WBC 6.6  --   NEUTROABS 3.5  --   HGB 13.6 14.6  HCT 41.6 43.0  MCV 88.3  --   PLT 184  --    Basic Metabolic Panel:  Recent Labs Lab 01/25/16 1041 01/25/16 1048  NA 137 139  K 4.4 4.4  CL 106 107  CO2 22  --   GLUCOSE 200* 194*  BUN 15 17  CREATININE 0.77 0.70  CALCIUM 9.5  --    GFR: CrCl cannot be calculated (Unknown ideal weight.). Liver Function Tests:  Recent Labs Lab 01/25/16 1041  AST 27  ALT 27  ALKPHOS 103  BILITOT 0.6  PROT 6.5  ALBUMIN 3.7   No results for input(s): LIPASE, AMYLASE in the last 168 hours. No results for input(s): AMMONIA in the last 168 hours. Coagulation Profile:  Recent Labs Lab 01/25/16 1041  INR 1.12   Cardiac Enzymes: No results for input(s): CKTOTAL, CKMB, CKMBINDEX, TROPONINI in the last 168 hours. BNP (last 3 results) No results for input(s): PROBNP in the last 8760 hours. HbA1C: No results for input(s): HGBA1C in the last 72 hours. CBG:  Recent Labs Lab 01/25/16 1051  GLUCAP 185*   Lipid Profile: No results for input(s): CHOL, HDL, LDLCALC, TRIG, CHOLHDL, LDLDIRECT in the last 72 hours. Thyroid Function Tests: No results for input(s): TSH, T4TOTAL, FREET4, T3FREE, THYROIDAB in the last 72 hours. Anemia Panel: No results for input(s): VITAMINB12, FOLATE, FERRITIN, TIBC, IRON, RETICCTPCT in the last 72 hours. Urine analysis:    Component Value Date/Time   COLORURINE AMBER* 10/27/2015 1128   APPEARANCEUR CLEAR 10/27/2015 1128   LABSPEC 1.025 10/27/2015 1128   PHURINE 8.0 10/27/2015 1128   GLUCOSEU NEGATIVE 10/27/2015 1128   GLUCOSEU NEGATIVE 12/14/2012 1213   HGBUR NEGATIVE 10/27/2015 1128   BILIRUBINUR NEG 01/08/2016 1212   BILIRUBINUR NEGATIVE 10/27/2015 1128   KETONESUR NEGATIVE 10/27/2015 1128   PROTEINUR NEG 01/08/2016 1212   PROTEINUR 30* 10/27/2015 1128   UROBILINOGEN negative 01/08/2016 1212   UROBILINOGEN 0.2 12/14/2012 1213    NITRITE NEG 01/08/2016 1212   NITRITE NEGATIVE 10/27/2015  1128   LEUKOCYTESUR Negative 01/08/2016 1212   Sepsis Labs: @LABRCNTIP (procalcitonin:4,lacticidven:4) )No results found for this or any previous visit (from the past 240 hour(s)).   Radiological Exams on Admission: Ct Head Wo Contrast  01/25/2016  CLINICAL DATA:  Memory loss. EXAM: CT HEAD WITHOUT CONTRAST TECHNIQUE: Contiguous axial images were obtained from the base of the skull through the vertex without intravenous contrast. COMPARISON:  None. FINDINGS: Brain: Mild atrophy with sulcal prominence and prominence of the bifrontal extra-axial spaces. The gray-white differentiation is maintained without CT evidence of acute large territory infarct. No intraparenchymal extra-axial mass or hemorrhage. Normal size and configuration of the ventricles and basilar cisterns. Vascular: No hyperdense vessel or unexpected calcification. Skull: Negative for fracture or focal lesion. Sinuses/Orbits: Limited visualization the paranasal sinuses and mastoid air cells is normal. No air-fluid levels. Other: Regional soft tissues appear normal. IMPRESSION: Mild atrophy without acute intracranial process. Electronically Signed   By: Sandi Mariscal M.D.   On: 01/25/2016 12:38   Mr Brain Wo Contrast  01/25/2016  CLINICAL DATA:  Probable altered mental status. Confusion. Possible TIA EXAM: MRI HEAD WITHOUT CONTRAST TECHNIQUE: Multiplanar, multiecho pulse sequences of the brain and surrounding structures were obtained without intravenous contrast. COMPARISON:  CT head 01/25/2016 FINDINGS: Small foci of restricted diffusion in the left hippocampus tail and left amygdala. These are most likely related to small foci of acute infarct. No other areas of acute infarct. Ventricle size normal. Cerebral volume normal. Several small white matter hyperintensities in the frontal lobes bilaterally consistent with minimal chronic microvascular ischemia. Brainstem and cerebellum intact.  Negative for intracranial hemorrhage. Negative for fluid collection. Negative for mass or edema.  No shift of the midline structures. Pituitary normal in size. Mild mucosal edema paranasal sinuses. Normal orbit. Circle of Willis patent. IMPRESSION: Two small area of acute infarct in the left amygdala and tail of the hippocampus. These each measure approximately 3 mm. Minimal chronic microvascular ischemic changes in the white matter. Electronically Signed   By: Franchot Gallo M.D.   On: 01/25/2016 15:07    EKG: (Independently reviewed) sinus rhythm with ventricular rate 72 bpm, QTC 409 ms, chronic appearing elevated J point in inferior lateral leads compared with previous EKGs  Assessment/Plan Principal Problem:   Acute ischemic stroke  -Patient's pre-presenting symptoms correlate with location of stroke in the amygdala and hippocampus and remained symptom-free -Formal neurological consultation pending -HgbA1c and lipid panel -PT/OT/SLP evaluation -MRA brain, carotid duplex and echocardiogram -Aspirin -Risk is factor modification; patient reports intolerance to statins in the past although was on Vytorin could not afford this medication so may need physician documentation/note to insurance company to ensure patient can get this medication if indicated by lipid panel and current diagnosis  Active Problems:   Essential hypertension -Currently well controlled on home medications of Tenormin and Zestril    Diet-controlled diabetes mellitus  -Hyperglycemic today -Follow-up on hemoglobin A1c    HLD (hyperlipidemia) -Currently not on medical therapy -History of statin intolerance and could not afford Vytorin (see above)    History of UTI -On chronic suppressive therapy with Macrodantin      DVT prophylaxis: Lovenox Code Status: Full code Family Communication: No family at bedside Disposition Plan: Anticipate discharge to preadmission home environment once medically stable Consults  called: Neurology/Nandigam  Admission status: Telemetry/inpatient (confirmed stroke)     Jenita Rayfield L. ANP-BC [facility] Pager [phone]   If 7PM-7AM, please contact night-coverage [email or web address] Password [facility]  01/25/2016, 5:31 PM

## 2016-01-25 NOTE — ED Notes (Signed)
Pt sts episode of AMS with confusion lasting a couple of minutes Saturday night; pt now without any sx

## 2016-01-25 NOTE — Telephone Encounter (Signed)
Per chart review tab pt is at [place]. 

## 2016-01-25 NOTE — ED Provider Notes (Signed)
CSN: [id number]     Arrival date & time 01/25/16  1014 History   First MD Initiated Contact with Patient 01/25/16 1103     Chief Complaint  Patient presents with  . Altered Mental Status     (Consider location/radiation/quality/duration/timing/severity/associated sxs/prior Treatment) HPI Comments: Patient presents after an episode of altered mental status. She states she was working on the computer with her husband Saturday night/early Sunday morning at 1-2 AM. This was over 24 hours ago. She states she suddenly felt like she was having out of body experience and couldn't remember things. She knew where she was. She knew who her husband was and who she was but she couldn't remember any time her dates. She can't remember how old she was or how old her husband was. She couldn't remember what month it was or what year it was. She states this lasted about 3-6 minutes. She had no other associated symptoms. No vision changes. No numbness or weakness in her extremities. No slurred speech. She's had no recurrence of the episode although she's had a slight nagging headache since that time. She does have a history of diet controlled diabetes, hypertension and hyperlipidemia.  Patient is a 65 y.o. female presenting with altered mental status.  Altered Mental Status Presenting symptoms: confusion   Associated symptoms: no abdominal pain, no fever, no headaches, no nausea, no rash, no vomiting and no weakness     Past Medical History  Diagnosis Date  . GERD (gastroesophageal reflux disease)   . Allergy   . Hypertension   . Hyperlipidemia   . History of chicken pox   . Urinary incontinence   . Recurrent urinary tract infection   . Iritis     per [facility]  . Diverticulosis   . Fatty liver   . Snores   . Wears glasses   . Anxiety   . Depression   . Heart murmur     "dx'd years ago; very mild; never treated" (10/27/2015)  . OSA on CPAP   . Pneumonia ~ 2013; 10/27/2015  . Hypothyroidism  2002-~ 2010  . Type II diabetes mellitus ([place])     "lost weight; started exercising again; don't have it anymore" (10/27/2015)  . Anemia     "all the time as a child"  . Migraines     "stopped when I went thru menopause"  . Arthritis     "all my joints; worse in my hands" (10/27/2015)  . Chronic lower back pain    Past Surgical History  Procedure Laterality Date  . Pelvic floor repair  2001    "lift"  . Polypectomy  X 3    "bladder"  . Shoulder arthroscopy w/ rotator cuff repair Left 2013  . Shoulder arthroscopy w/ rotator cuff repair Right 2015  . Hemorrhoid banding  X 2  . Breast lumpectomy Right 1964    benign tumor,  . Vaginal hysterectomy  1992    Partial  . Tubal ligation  ~ 1986  . Knee arthroscopy Left 2016    "meniscus repair"  . Blepharoplasty Bilateral 2013; 2016   Family History  Problem Relation Age of Onset  . Arthritis Maternal Grandmother   . Stroke Maternal Grandmother   . Diabetes Maternal Grandmother   . Heart disease Paternal Uncle     x 2  . Arthritis Sister   . Breast cancer Cousin   . Heart disease Paternal Aunt   . Diabetes Paternal Uncle   . Arthritis Mother   .  Heart disease Mother   . Hyperlipidemia Mother   . Hypertension Mother   . Diabetes Mother   . Depression Mother   . Alcohol abuse Father   . Lung cancer Father   . Diabetes Sister   . Heart disease Paternal Uncle     x 5  . Heart disease Paternal Aunt     x 3  . Stroke Paternal Aunt   . Colon cancer Neg Hx    Social History  Substance Use Topics  . Smoking status: Former Smoker -- 2.00 packs/day for 5 years    Types: Cigarettes    Quit date: 09/12/1985  . Smokeless tobacco: Never Used  . Alcohol Use: 1.2 oz/week    0 Standard drinks or equivalent, 2 Shots of liquor per week   OB History    No data available     Review of Systems  Constitutional: Negative for fever, chills, diaphoresis and fatigue.  HENT: Negative for congestion, rhinorrhea and sneezing.   Eyes:  Negative.   Respiratory: Negative for cough, chest tightness and shortness of breath.   Cardiovascular: Negative for chest pain and leg swelling.  Gastrointestinal: Negative for nausea, vomiting, abdominal pain, diarrhea and blood in stool.  Genitourinary: Negative for frequency, hematuria, flank pain and difficulty urinating.  Musculoskeletal: Negative for back pain and arthralgias.  Skin: Negative for rash.  Neurological: Negative for dizziness, speech difficulty, weakness, numbness and headaches.  Psychiatric/Behavioral: Positive for confusion.      Allergies  Cephalexin; Codeine; Inapsine; Lipitor; Niacin and related; Pravastatin; and Adhesive  Home Medications   Prior to Admission medications   Medication Sig Start Date End Date Taking? Authorizing Provider  albuterol (PROVENTIL HFA;VENTOLIN HFA) 108 (90 Base) MCG/ACT inhaler Inhale 1-2 puffs into the lungs every 6 (six) hours as needed for wheezing or shortness of breath. 10/26/15   Tonia Ghent, MD  atenolol (TENORMIN) 50 MG tablet Take 1 tablet (50 mg total) by mouth daily. 04/28/15   Tonia Ghent, MD  Calcium-Vitamin D 600-200 MG-UNIT per tablet Take 1 tablet by mouth 2 (two) times daily.    Historical Provider, MD  cetirizine (ZYRTEC) 10 MG tablet Take 10 mg by mouth as needed.    Historical Provider, MD  citalopram (CELEXA) 20 MG tablet Take 1 tablet (20 mg total) by mouth 2 (two) times daily. 11/01/15   Orson Eva, MD  fluticasone (FLONASE) 50 MCG/ACT nasal spray Place 2 sprays into both nostrils daily. 01/08/16   Tonia Ghent, MD  lisinopril (PRINIVIL,ZESTRIL) 10 MG tablet Take 1 tablet (10 mg total) by mouth at bedtime. 04/28/15   Tonia Ghent, MD  Multiple Vitamin (MULTIVITAMIN) tablet Take 1 tablet by mouth daily.    Historical Provider, MD  nitrofurantoin (MACRODANTIN) 100 MG capsule Take 100 mg by mouth daily.     Historical Provider, MD  omeprazole (PRILOSEC) 20 MG capsule Take 20 mg by mouth daily.    Historical  Provider, MD  vitamin B-12 (CYANOCOBALAMIN) 1000 MCG tablet Take 1,000 mcg by mouth daily.    Historical Provider, MD   BP 136/102 mmHg  Pulse 59  Temp(Src) 99.4 F (37.4 C) (Oral)  Resp 17  SpO2 99% Physical Exam  Constitutional: She is oriented to person, place, and time. She appears well-developed and well-nourished.  HENT:  Head: Normocephalic and atraumatic.  Eyes: Pupils are equal, round, and reactive to light.  Neck: Normal range of motion. Neck supple.  Cardiovascular: Normal rate, regular rhythm and normal heart sounds.  Pulmonary/Chest: Effort normal and breath sounds normal. No respiratory distress. She has no wheezes. She has no rales. She exhibits no tenderness.  Abdominal: Soft. Bowel sounds are normal. There is no tenderness. There is no rebound and no guarding.  Musculoskeletal: Normal range of motion. She exhibits no edema.  Lymphadenopathy:    She has no cervical adenopathy.  Neurological: She is alert and oriented to person, place, and time. She has normal strength. No cranial nerve deficit or sensory deficit. GCS eye subscore is 4. GCS verbal subscore is 5. GCS motor subscore is 6.  FTN intact, no pronator drift  Skin: Skin is warm and dry. No rash noted.  Psychiatric: She has a normal mood and affect.    ED Course  Procedures (including critical care time) Labs Review Labs Reviewed  COMPREHENSIVE METABOLIC PANEL - Abnormal; Notable for the following:    Glucose, Bld 200 (*)    All other components within normal limits  CBG MONITORING, ED - Abnormal; Notable for the following:    Glucose-Capillary 185 (*)    All other components within normal limits  I-STAT CHEM 8, ED - Abnormal; Notable for the following:    Glucose, Bld 194 (*)    All other components within normal limits  PROTIME-INR  APTT  CBC  DIFFERENTIAL  I-STAT TROPOININ, ED    Imaging Review Ct Head Wo Contrast  01/25/2016  CLINICAL DATA:  Memory loss. EXAM: CT HEAD WITHOUT CONTRAST  TECHNIQUE: Contiguous axial images were obtained from the base of the skull through the vertex without intravenous contrast. COMPARISON:  None. FINDINGS: Brain: Mild atrophy with sulcal prominence and prominence of the bifrontal extra-axial spaces. The gray-white differentiation is maintained without CT evidence of acute large territory infarct. No intraparenchymal extra-axial mass or hemorrhage. Normal size and configuration of the ventricles and basilar cisterns. Vascular: No hyperdense vessel or unexpected calcification. Skull: Negative for fracture or focal lesion. Sinuses/Orbits: Limited visualization the paranasal sinuses and mastoid air cells is normal. No air-fluid levels. Other: Regional soft tissues appear normal. IMPRESSION: Mild atrophy without acute intracranial process. Electronically Signed   By: Sandi Mariscal M.D.   On: 01/25/2016 12:38   Mr Brain Wo Contrast  01/25/2016  CLINICAL DATA:  Probable altered mental status. Confusion. Possible TIA EXAM: MRI HEAD WITHOUT CONTRAST TECHNIQUE: Multiplanar, multiecho pulse sequences of the brain and surrounding structures were obtained without intravenous contrast. COMPARISON:  CT head 01/25/2016 FINDINGS: Small foci of restricted diffusion in the left hippocampus tail and left amygdala. These are most likely related to small foci of acute infarct. No other areas of acute infarct. Ventricle size normal. Cerebral volume normal. Several small white matter hyperintensities in the frontal lobes bilaterally consistent with minimal chronic microvascular ischemia. Brainstem and cerebellum intact. Negative for intracranial hemorrhage. Negative for fluid collection. Negative for mass or edema.  No shift of the midline structures. Pituitary normal in size. Mild mucosal edema paranasal sinuses. Normal orbit. Circle of Willis patent. IMPRESSION: Two small area of acute infarct in the left amygdala and tail of the hippocampus. These each measure approximately 3 mm.  Minimal chronic microvascular ischemic changes in the white matter. Electronically Signed   By: Franchot Gallo M.D.   On: 01/25/2016 15:07   I have personally reviewed and evaluated these images and lab results as part of my medical decision-making.   EKG Interpretation   Date/Time:  Monday Jan 25 2016 10:24:44 EDT Ventricular Rate:  72 PR Interval:  180 QRS Duration: 84 QT Interval:  374 QTC Calculation: 409 R Axis:   38 Text Interpretation:  Normal sinus rhythm Low voltage QRS Cannot rule out  Anterior infarct , age undetermined Abnormal ECG since last tracing no  significant change Confirmed by Kendan Cornforth  MD, Alvah Lagrow ([id number]) on 01/25/2016  11:45:51 AM      MDM   Final diagnoses:  Acute CVA (cerebrovascular accident) ([place])    13:15 I spoke with Dr. Nyoka Cowden who recommned MR.  If negative, can be discharged with outpt neuro f/u.  15:59 PT does have acute infarct on MR.  Notified Dr. Nyoka Cowden and spoke with [facility] with hospitalist service who will admit pt.  Malvin Johns, MD 01/25/16 1600

## 2016-01-25 NOTE — Telephone Encounter (Signed)
Will await ER notes.  Thanks.  Agree with ER eval.

## 2016-01-25 NOTE — ED Notes (Signed)
CBG 185, RN aware.

## 2016-01-26 ENCOUNTER — Inpatient Hospital Stay (HOSPITAL_COMMUNITY): Payer: Medicare Other

## 2016-01-26 DIAGNOSIS — I639 Cerebral infarction, unspecified: Principal | ICD-10-CM

## 2016-01-26 DIAGNOSIS — R4182 Altered mental status, unspecified: Secondary | ICD-10-CM

## 2016-01-26 DIAGNOSIS — Z87440 Personal history of urinary (tract) infections: Secondary | ICD-10-CM

## 2016-01-26 DIAGNOSIS — I6789 Other cerebrovascular disease: Secondary | ICD-10-CM

## 2016-01-26 LAB — GLUCOSE, CAPILLARY
Glucose-Capillary: 149 mg/dL — ABNORMAL HIGH (ref 65–99)
Glucose-Capillary: 115 mg/dL — ABNORMAL HIGH (ref 65–99)
Glucose-Capillary: 123 mg/dL — ABNORMAL HIGH (ref 65–99)
Glucose-Capillary: 125 mg/dL — ABNORMAL HIGH (ref 65–99)

## 2016-01-26 LAB — URINALYSIS, ROUTINE W REFLEX MICROSCOPIC
Bilirubin Urine: NEGATIVE
Glucose, UA: NEGATIVE mg/dL
Ketones, ur: NEGATIVE mg/dL
Nitrite: NEGATIVE
Protein, ur: NEGATIVE mg/dL
Specific Gravity, Urine: 1.017 (ref 1.005–1.030)
pH: 6 (ref 5.0–8.0)

## 2016-01-26 LAB — URINE MICROSCOPIC-ADD ON

## 2016-01-26 LAB — LIPID PANEL
Cholesterol: 246 mg/dL — ABNORMAL HIGH (ref 0–200)
HDL: 30 mg/dL — ABNORMAL LOW (ref >40)
LDL Cholesterol: 167 mg/dL — ABNORMAL HIGH (ref 0–99)
Total CHOL/HDL Ratio: 8.2 RATIO
Triglycerides: 247 mg/dL — ABNORMAL HIGH (ref <150)
VLDL: 49 mg/dL — ABNORMAL HIGH (ref 0–40)

## 2016-01-26 LAB — ECHOCARDIOGRAM COMPLETE
Height: 66.5 in
Weight: 2891.2 oz

## 2016-01-26 MED ORDER — EZETIMIBE-SIMVASTATIN 10-20 MG PO TABS
1.0000 | ORAL_TABLET | Freq: Every day | ORAL | Status: DC
  Administered 2016-01-26 – 2016-01-27 (×2): 1 via ORAL
  Filled 2016-01-26 (×3): qty 1

## 2016-01-26 MED ORDER — OMEPRAZOLE 20 MG PO CPDR
20.0000 mg | DELAYED_RELEASE_CAPSULE | Freq: Every day | ORAL | Status: DC
  Administered 2016-01-26 – 2016-01-28 (×3): 20 mg via ORAL
  Filled 2016-01-26 (×4): qty 1

## 2016-01-26 MED ORDER — ACETAMINOPHEN 325 MG PO TABS
650.0000 mg | ORAL_TABLET | Freq: Four times a day (QID) | ORAL | Status: DC | PRN
  Administered 2016-01-26 – 2016-01-28 (×4): 650 mg via ORAL
  Filled 2016-01-26 (×4): qty 2

## 2016-01-26 NOTE — Consult Note (Signed)
Requesting Physician: Dr. Tana Coast    Chief Complaint: Stroke   HPI:                                                                                                                                         Allison Yoder is an 65 y.o. female with medical history significant for recurrent UTIs on Macrodantin, dyslipidemia not on statin, hypertension on medication who presented to the ER with reports of transient altered mental status this past Saturday evening 5/13 when watching TV. She reports symptoms only lasted a few minutes and consisted of a dreamlike sensation without any focal motor neurological deficits or any sensory deficits.  During the episode she was unable to recall her age, her significant other's, her or her children's age. She was also having difficulty knowing exactly what the month and year was. Symptoms rapidly resolved and patient has remained at a normal baseline on arrival to ED. CT head without contrast showed Mild atrophy without acute and cardiac process. MRI brain without contrast showed 2 small areas of acute infarct in the left middle and tail of the hippocampus each measuring approximately 3 mm.   Date last known well: 5.13.2017 Time last known well: Unable to determine tPA Given: No: symptoms resolved   Past Medical History  Diagnosis Date  . GERD (gastroesophageal reflux disease)   . Allergy   . Hypertension   . Hyperlipidemia   . History of chicken pox   . Urinary incontinence   . Recurrent urinary tract infection   . Iritis     per [facility]  . Diverticulosis   . Fatty liver   . Snores   . Wears glasses   . Anxiety   . Depression   . Heart murmur     "dx'd years ago; very mild; never treated" (10/27/2015)  . OSA on CPAP   . Pneumonia ~ 2013; 10/27/2015  . Hypothyroidism 2002-~ 2010  . Type II diabetes mellitus ([place])     "lost weight; started exercising again; don't have it anymore" (10/27/2015)  . Anemia     "all the time as a child"  .  Migraines     "stopped when I went thru menopause"  . Arthritis     "all my joints; worse in my hands" (10/27/2015)  . Chronic lower back pain     Past Surgical History  Procedure Laterality Date  . Pelvic floor repair  2001    "lift"  . Polypectomy  X 3    "bladder"  . Shoulder arthroscopy w/ rotator cuff repair Left 2013  . Shoulder arthroscopy w/ rotator cuff repair Right 2015  . Hemorrhoid banding  X 2  . Breast lumpectomy Right 1964    benign tumor,  . Vaginal hysterectomy  1992    Partial  . Tubal ligation  ~ 1986  . Knee arthroscopy Left 2016    "  meniscus repair"  . Blepharoplasty Bilateral 2013; 2016    Family History  Problem Relation Age of Onset  . Arthritis Maternal Grandmother   . Stroke Maternal Grandmother   . Diabetes Maternal Grandmother   . Heart disease Paternal Uncle     x 2  . Arthritis Sister   . Breast cancer Cousin   . Heart disease Paternal Aunt   . Diabetes Paternal Uncle   . Arthritis Mother   . Heart disease Mother   . Hyperlipidemia Mother   . Hypertension Mother   . Diabetes Mother   . Depression Mother   . Alcohol abuse Father   . Lung cancer Father   . Diabetes Sister   . Heart disease Paternal Uncle     x 5  . Heart disease Paternal Aunt     x 3  . Stroke Paternal Aunt   . Colon cancer Neg Hx    Social History:  reports that she quit smoking about 30 years ago. Her smoking use included Cigarettes. She has a 10 pack-year smoking history. She has never used smokeless tobacco. She reports that she drinks about 1.2 oz of alcohol per week. She reports that she does not use illicit drugs.  Allergies:  Allergies  Allergen Reactions  . Cephalexin Itching  . Codeine Itching  . Inapsine [Droperidol] Shortness Of Breath, Anxiety and Hypertension    Elevated HR and BP, panic attack  . Lipitor [Atorvastatin Calcium]     Myalgias   . Niacin And Related     Flushing  . Pravastatin     Myalgias  . Adhesive [Tape] Rash    Blisters  with tape    Medications:                                                                                                                           Prior to Admission:  Prescriptions prior to admission  Medication Sig Dispense Refill Last Dose  . albuterol (PROVENTIL HFA;VENTOLIN HFA) 108 (90 Base) MCG/ACT inhaler Inhale 1-2 puffs into the lungs every 6 (six) hours as needed for wheezing or shortness of breath. 1 Inhaler 1 unknown at Unknown time  . atenolol (TENORMIN) 50 MG tablet Take 1 tablet (50 mg total) by mouth daily. 90 tablet 3 01/25/2016 at 0800  . Calcium-Vitamin D 600-200 MG-UNIT per tablet Take 1 tablet by mouth 2 (two) times daily.   01/24/2016 at Unknown time  . cetirizine (ZYRTEC) 10 MG tablet Take 10 mg by mouth daily as needed for allergies.    01/24/2016 at Unknown time  . citalopram (CELEXA) 20 MG tablet Take 1 tablet (20 mg total) by mouth 2 (two) times daily. 30 tablet 0 01/25/2016 at Unknown time  . fluticasone (FLONASE) 50 MCG/ACT nasal spray Place 2 sprays into both nostrils daily. 16 g 6 01/25/2016 at Unknown time  . lisinopril (PRINIVIL,ZESTRIL) 10 MG tablet Take 1 tablet (10 mg total) by mouth at bedtime. 90 tablet 3  01/24/2016 at Unknown time  . Multiple Vitamin (MULTIVITAMIN) tablet Take 1 tablet by mouth daily.   01/25/2016 at Unknown time  . nitrofurantoin (MACRODANTIN) 100 MG capsule Take 100 mg by mouth daily.    01/24/2016 at Unknown time  . omeprazole (PRILOSEC) 20 MG capsule Take 20 mg by mouth daily.   01/25/2016 at Unknown time  . vitamin B-12 (CYANOCOBALAMIN) 1000 MCG tablet Take 1,000 mcg by mouth daily.   01/25/2016 at Unknown time   Scheduled: . aspirin EC  325 mg Oral Daily  . atenolol  50 mg Oral Daily  . calcium-vitamin D  1 tablet Oral BID  . citalopram  20 mg Oral BID  . enoxaparin (LOVENOX) injection  40 mg Subcutaneous Q24H  . ezetimibe-simvastatin  1 tablet Oral q1800  . fluticasone  2 spray Each Nare Daily  . insulin aspart  0-5 Units Subcutaneous  QHS  . insulin aspart  0-9 Units Subcutaneous TID WC  . lisinopril  10 mg Oral QHS  . loratadine  10 mg Oral Daily  . multivitamin with minerals  1 tablet Oral Daily  . nitrofurantoin  100 mg Oral Daily  . pantoprazole  40 mg Oral Daily  . vitamin B-12  1,000 mcg Oral Daily    ROS:                                                                                                                                       History obtained from the patient  General ROS: negative for - chills, fatigue, fever, night sweats, weight gain or weight loss Psychological ROS: negative for - behavioral disorder, hallucinations, memory difficulties, mood swings or suicidal ideation Ophthalmic ROS: negative for - blurry vision, double vision, eye pain or loss of vision ENT ROS: negative for - epistaxis, nasal discharge, oral lesions, sore throat, tinnitus or vertigo Allergy and Immunology ROS: negative for - hives or itchy/watery eyes Hematological and Lymphatic ROS: negative for - bleeding problems, bruising or swollen lymph nodes Endocrine ROS: negative for - galactorrhea, hair pattern changes, polydipsia/polyuria or temperature intolerance Respiratory ROS: negative for - cough, hemoptysis, shortness of breath or wheezing Cardiovascular ROS: negative for - chest pain, dyspnea on exertion, edema or irregular heartbeat Gastrointestinal ROS: negative for - abdominal pain, diarrhea, hematemesis, nausea/vomiting or stool incontinence Genito-Urinary ROS: negative for - dysuria, hematuria, incontinence or urinary frequency/urgency Musculoskeletal ROS: negative for - joint swelling or muscular weakness Neurological ROS: as noted in HPI Dermatological ROS: negative for rash and skin lesion changes  Neurologic Examination:  Blood pressure 140/79, pulse 64, temperature 98.2 F (36.8 C), temperature source Oral, resp.  rate 19, height 5' 6.5" (1.689 m), weight 81.965 kg (180 lb 11.2 oz), SpO2 96 %.  HEENT-  Normocephalic, no lesions, without obvious abnormality.  Normal external eye and conjunctiva.  Normal TM's bilaterally.  Normal auditory canals and external ears. Normal external nose, mucus membranes and septum.  Normal pharynx. Cardiovascular- S1, S2 normal, pulses palpable throughout   Lungs- chest clear, no wheezing, rales, normal symmetric air entry Abdomen- normal findings: bowel sounds normal Extremities- no edema Lymph-no adenopathy palpable Musculoskeletal-no joint tenderness, deformity or swelling Skin-warm and dry, no hyperpigmentation, vitiligo, or suspicious lesions  Neurological Examination Mental Status: Alert, oriented, thought content appropriate.  Speech fluent without evidence of aphasia.  Able to follow 3 step commands without difficulty. Cranial Nerves: II: Visual fields grossly normal, pupils equal, round, reactive to light and accommodation III,IV, VI: ptosis not present, extra-ocular motions intact bilaterally V,VII: smile symmetric, facial light touch sensation normal bilaterally VIII: hearing normal bilaterally IX,X: uvula rises symmetrically XI: bilateral shoulder shrug XII: midline tongue extension Motor: Right : Upper extremity   5/5    Left:     Upper extremity   5/5  Lower extremity   5/5     Lower extremity   5/5 Tone and bulk:normal tone throughout; no atrophy noted Sensory: Pinprick and light touch intact throughout, bilaterally Deep Tendon Reflexes: 2+ and symmetric throughout Plantars: Right: downgoing   Left: downgoing Cerebellar: normal finger-to-nose,  Gait: not tested       Lab Results: Basic Metabolic Panel:  Recent Labs Lab 01/25/16 1041 01/25/16 1048  NA 137 139  K 4.4 4.4  CL 106 107  CO2 22  --   GLUCOSE 200* 194*  BUN 15 17  CREATININE 0.77 0.70  CALCIUM 9.5  --     Liver Function Tests:  Recent Labs Lab 01/25/16 1041  AST  27  ALT 27  ALKPHOS 103  BILITOT 0.6  PROT 6.5  ALBUMIN 3.7   No results for input(s): LIPASE, AMYLASE in the last 168 hours. No results for input(s): AMMONIA in the last 168 hours.  CBC:  Recent Labs Lab 01/25/16 1041 01/25/16 1048  WBC 6.6  --   NEUTROABS 3.5  --   HGB 13.6 14.6  HCT 41.6 43.0  MCV 88.3  --   PLT 184  --     Cardiac Enzymes: No results for input(s): CKTOTAL, CKMB, CKMBINDEX, TROPONINI in the last 168 hours.  Lipid Panel:  Recent Labs Lab 01/26/16 0656  CHOL 246*  TRIG 247*  HDL 30*  CHOLHDL 8.2  VLDL 49*  LDLCALC 167*    CBG:  Recent Labs Lab 01/25/16 1051 01/25/16 2257 01/26/16 0825 01/26/16 1216  GLUCAP 185* 122* 149* 115*    Microbiology: Results for orders placed or performed in visit on 01/08/16  Urine culture     Status: None   Collection Time: 01/08/16 12:19 PM  Result Value Ref Range Status   Colony Count 95,000 COLONIES/ML  Final   Organism ID, Bacteria LACTOBACILLUS SPECIES  Final    Comment: Standardized susceptibility testing for this organism is not available.     Coagulation Studies:  Recent Labs  01/25/16 1041  LABPROT 14.6  INR 1.12    Imaging: Ct Head Wo Contrast  01/25/2016  CLINICAL DATA:  Memory loss. EXAM: CT HEAD WITHOUT CONTRAST TECHNIQUE: Contiguous axial images were obtained from the base of the skull through the vertex  without intravenous contrast. COMPARISON:  None. FINDINGS: Brain: Mild atrophy with sulcal prominence and prominence of the bifrontal extra-axial spaces. The gray-white differentiation is maintained without CT evidence of acute large territory infarct. No intraparenchymal extra-axial mass or hemorrhage. Normal size and configuration of the ventricles and basilar cisterns. Vascular: No hyperdense vessel or unexpected calcification. Skull: Negative for fracture or focal lesion. Sinuses/Orbits: Limited visualization the paranasal sinuses and mastoid air cells is normal. No air-fluid  levels. Other: Regional soft tissues appear normal. IMPRESSION: Mild atrophy without acute intracranial process. Electronically Signed   By: Sandi Mariscal M.D.   On: 01/25/2016 12:38   Mr Jodene Nam Head Wo Contrast  01/26/2016  CLINICAL DATA:  Followup ischemic stroke. History of hypertension, hyperlipidemia, diabetes. EXAM: MRA HEAD WITHOUT CONTRAST TECHNIQUE: Angiographic images of the Circle of Willis were obtained using MRA technique without intravenous contrast. COMPARISON:  MRI of the brain Jan 25, 2016 at 1428 hours FINDINGS: Anterior circulation: Normal flow related enhancement of the included cervical, petrous, cavernous and supraclinoid internal carotid arteries. Patent anterior communicating artery. A1 segment is dominant. Normal flow related enhancement of the anterior and middle cerebral arteries, including distal segments. No large vessel occlusion, high-grade stenosis, abnormal luminal irregularity, aneurysm. Posterior circulation: LEFT vertebral artery is dominant. Basilar artery is patent, with normal flow related enhancement of the main branch vessels. Fetal origin LEFT posterior cerebral artery. Normal flow related enhancement of the posterior cerebral arteries. No large vessel occlusion, high-grade stenosis, abnormal luminal irregularity, aneurysm. IMPRESSION: Negative MRA head. Electronically Signed   By: Elon Alas M.D.   On: 01/26/2016 00:05   Mr Brain Wo Contrast  01/25/2016  CLINICAL DATA:  Probable altered mental status. Confusion. Possible TIA EXAM: MRI HEAD WITHOUT CONTRAST TECHNIQUE: Multiplanar, multiecho pulse sequences of the brain and surrounding structures were obtained without intravenous contrast. COMPARISON:  CT head 01/25/2016 FINDINGS: Small foci of restricted diffusion in the left hippocampus tail and left amygdala. These are most likely related to small foci of acute infarct. No other areas of acute infarct. Ventricle size normal. Cerebral volume normal. Several small  white matter hyperintensities in the frontal lobes bilaterally consistent with minimal chronic microvascular ischemia. Brainstem and cerebellum intact. Negative for intracranial hemorrhage. Negative for fluid collection. Negative for mass or edema.  No shift of the midline structures. Pituitary normal in size. Mild mucosal edema paranasal sinuses. Normal orbit. Circle of Willis patent. IMPRESSION: Two small area of acute infarct in the left amygdala and tail of the hippocampus. These each measure approximately 3 mm. Minimal chronic microvascular ischemic changes in the white matter. Electronically Signed   By: Franchot Gallo M.D.   On: 01/25/2016 15:07   LDL 167 A1c in process   Assessment and plan discussed with with attending physician and they are in agreement.    Etta Quill PA-C [facility] [phone]  01/26/2016, 2:54 PM   Assessment: 65 y.o. female with acute strokes noted in the left amygdala and hippocampus.   Stroke Risk Factors - diabetes mellitus, hyperlipidemia and hypertension  Recommend: 1. HgbA1c, fasting lipid panel 2. MRI, MRA  of the brain without contrast 3. PT consult, OT consult, Speech consult 4. Echocardiogram 5. Carotid dopplers 6. Prophylactic therapy-Antiplatelet med: Aspirin - dose 325 mg daily 7. Risk factor modification 8. Telemetry monitoring 9. Frequent neuro checks 10 NPO until passes stroke swallow screen 11 please page stroke NP  Or  PA  Or MD from 8am -4 pm  as this patient from this time will be  followed by the stroke.   You can look them up on [email or web address]  Password [email or web address]

## 2016-01-26 NOTE — Progress Notes (Signed)
OT Cancellation Note  Patient Details Name: Allison Yoder MRN: [id number] DOB: 27-Jan-1951   Cancelled Treatment:    Reason Eval/Treat Not Completed: OT screened, no needs identified, will sign off  Pt feels she is back to baseline.  Mickel Baas [place], [place] 01/26/2016, 10:45 AM

## 2016-01-26 NOTE — Progress Notes (Signed)
PT Cancellation Note  Patient Details Name: Allison Yoder MRN: [id number] DOB: [birth year]   Cancelled Treatment:    Reason Eval/Treat Not Completed: PT screened, no needs identified, will sign off Pt reports feeling back to baseline and no issues with mobility/balance. Observed up in hallway ambulating independently. Per discussion with OT, no PT needs. Screened and will sign off. Please re consult if any changes in function.   Marguarite Arbour A Samiksha Pellicano 01/26/2016, 11:28 AM  Wray Kearns, PT, DPT [phone]

## 2016-01-26 NOTE — Progress Notes (Signed)
  Echocardiogram 2D Echocardiogram has been performed.  Allison Yoder 01/26/2016, 2:55 PM

## 2016-01-26 NOTE — Progress Notes (Signed)
Per Dr. Tana Coast note, Carotid Duplex is on hold until Neuro evaluation. Please advise vascular lab if this test is needed.  Landry Mellow, [place], RVT [phone]

## 2016-01-26 NOTE — Progress Notes (Signed)
EEG completed; results pending.    

## 2016-01-26 NOTE — Care Management Note (Signed)
Case Management Note  Patient Details  Name: WESLYNN SUD MRN: [id number] Date of Birth: [birth year]  Subjective/Objective:                 Spoke with patient in the room, she states that she lives at home with her brother. She describes her stroke symtoms as losing track of time, not being oriented to moth or year, not remembering how old her boyfriend is, but not any weakness or difficulty walking or swallowing. Will have stroke work up today, and likely DC tomorrow. 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   Action/Plan:   Will DC to home self care, no HH anticipated.  Will continue to follow for anticoag assistance if needed.  Expected Discharge Date:                  Expected Discharge Plan:  Home/Self Care  In-House Referral:     Discharge planning Services  CM Consult  Post Acute Care Choice:  NA Choice offered to:     DME Arranged:    DME Agency:     HH Arranged:    [place] Agency:     Status of Service:  Completed, signed off  Medicare Important Message Given:    Date Medicare IM Given:    Medicare IM give by:    Date Additional Medicare IM Given:    Additional Medicare Important Message give by:     If discussed at [place] of Stay Meetings, dates discussed:    Additional Comments:  Carles Collet, RN 01/26/2016, 11:25 AM

## 2016-01-26 NOTE — Progress Notes (Signed)
[facility]                                                                              Patient Demographics  Allison Yoder, is a 65 y.o. female, DOB - [birth year], [id number]  Admit date - 01/25/2016   Admitting Physician Waldemar Dickens, MD  Outpatient Primary MD for the patient is Elsie Stain, MD  Outpatient specialists:   LOS - 1  days    Chief Complaint  Patient presents with  . Altered Mental Status       Brief summary   Patient is a 66 year old female with recurrent UTIs, dyslipidemia not on statin, hypertension presented to ED with transient altered mental status this past weekend. Symptoms have resolved now. MRI of the brain was positive for 2 small areas of acute infarct in the left amygdala and tail of the hippocampus. Patient was admitted for further workup.   Assessment & Plan    Principal Problem:  Acute Ischemic CVA- presenting with Transient altered mental status over the weekend followed by headache - Delayed presentation hence not a TPA candidate. Neurology was consulted. - MRI brain showed for 2 small areas of acute infarct in the left amygdala and tail of the hippocampus, 3 mm - MRA showed normal intracranial circulation ** - 2-D echo pending - Discussed with neurology, Dr Silverio Decamp, recommended to hold off on her carotids until neurology evaluation. - Lipid panel showed LDL 167, goal less than 70, per patient she has been on different kind of statins in the past and has not tolerated any statin except the Vytorin, had not been taking it. Restarted Vytorin - Hemoglobin A1c pending - PT evaluation back to baseline - Continue aspirin 325 mg daily   Active Problems:   HLD (hyperlipidemia) - LDL 167, restarted Vytorin    Essential hypertension -BP currently stable, continue atenolol, lisinopril     History of  recurrent UTI - Obtain UA and culture, continue prophylactic nitrofurantoin    Diet-controlled diabetes mellitus  ([place]) - Follow hemoglobin A1c  Code Status: *Full code   DVT Prophylaxis:  Lovenox   Family Communication: Discussed in detail with the patient, all imaging results, lab results explained to the patient    Disposition Plan: Hopefully next 24 hours  Time Spent in minutes  25 mins   Procedures:  MRI brain  MRA  Consultants:   Neurology   Antimicrobials: Prophylactic nitrofurantoin  Medications  Scheduled Meds: . aspirin EC  325 mg Oral Daily  . atenolol  50 mg Oral Daily  . calcium-vitamin D  1 tablet Oral BID  . citalopram  20 mg Oral BID  . enoxaparin (LOVENOX) injection  40 mg Subcutaneous Q24H  . fluticasone  2 spray Each Nare Daily  . insulin aspart  0-5 Units Subcutaneous QHS  . insulin aspart  0-9 Units Subcutaneous TID WC  . lisinopril  10 mg Oral QHS  . loratadine  10 mg Oral Daily  . multivitamin with minerals  1 tablet Oral Daily  . nitrofurantoin  100 mg Oral Daily  . pantoprazole  40 mg Oral Daily  . vitamin B-12  1,000 mcg  Oral Daily   Continuous Infusions:  PRN Meds:.acetaminophen, albuterol   Antibiotics   Anti-infectives    None        Subjective:   Allison Yoder was seen and examined today. Mental status at baseline, headache resolved. Patient denies dizziness, chest pain, shortness of breath, abdominal pain, N/V/D/C, new weakness, numbess, tingling. No acute events overnight.    Objective:   Filed Vitals:   01/26/16 0029 01/26/16 0213 01/26/16 0416 01/26/16 0809  BP: 140/76 128/75 129/75 140/79  Pulse: 61 65 66 64  Temp: 98.3 F (36.8 C) 98.2 F (36.8 C) 97.4 F (36.3 C) 98.2 F (36.8 C)  TempSrc:  Oral  Oral  Resp: 18 18 18 19   Height:      Weight:      SpO2: 93% 96% 97% 96%    Intake/Output Summary (Last 24 hours) at 01/26/16 1134 Last data filed at 01/25/16 2200  Gross per 24 hour  Intake    522 ml  Output      0 ml  Net    522 ml     Wt Readings from Last 3 Encounters:  01/25/16 81.965 kg (180 lb 11.2 oz)   01/08/16 80.287 kg (177 lb)  11/13/15 81.307 kg (179 lb 4 oz)     Exam  General: Alert and oriented x 3, NAD  HEENT:  PERRLA, EOMI, Anicteric Sclera, mucous membranes moist.   Neck: Supple, no JVD, no masses  Cardiovascular: S1 S2 auscultated, no rubs, murmurs or gallops. Regular rate and rhythm.  Respiratory: Clear to auscultation bilaterally, no wheezing, rales or rhonchi  Gastrointestinal: Soft, nontender, nondistended, + bowel sounds  Ext: no cyanosis clubbing or edema  Neuro: AAOx3, Cr N's II- XII. Strength 5/5 upper and lower extremities bilaterally  Skin: No rashes  Psych: Normal affect and demeanor, alert and oriented x3    Data Reviewed:  I have personally reviewed following labs and imaging studies  Micro Results No results found for this or any previous visit (from the past 240 hour(s)).  Radiology Reports Dg Chest 2 View  01/07/2016  CLINICAL DATA:  Follow-up pneumonia. EXAM: CHEST  2 VIEW COMPARISON:  CT 10/29/2015.  Chest x-ray 10/27/2015. FINDINGS: Mediastinum hilar structures normal. Near complete clearing of right lower lobe infiltrate. No pleural effusion or pneumothorax. Biapical pleural-parenchymal thickening noted consistent scarring. Heart size stable. No acute bony abnormality . IMPRESSION: Near complete clearing of right lower lobe infiltrate. Electronically Signed   By: [place]   On: 01/07/2016 12:00   Ct Head Wo Contrast  01/25/2016  CLINICAL DATA:  Memory loss. EXAM: CT HEAD WITHOUT CONTRAST TECHNIQUE: Contiguous axial images were obtained from the base of the skull through the vertex without intravenous contrast. COMPARISON:  None. FINDINGS: Brain: Mild atrophy with sulcal prominence and prominence of the bifrontal extra-axial spaces. The gray-white differentiation is maintained without CT evidence of acute large territory infarct. No intraparenchymal extra-axial mass or hemorrhage. Normal size and configuration of the ventricles and  basilar cisterns. Vascular: No hyperdense vessel or unexpected calcification. Skull: Negative for fracture or focal lesion. Sinuses/Orbits: Limited visualization the paranasal sinuses and mastoid air cells is normal. No air-fluid levels. Other: Regional soft tissues appear normal. IMPRESSION: Mild atrophy without acute intracranial process. Electronically Signed   By: Sandi Mariscal M.D.   On: 01/25/2016 12:38   Mr Jodene Nam Head Wo Contrast  01/26/2016  CLINICAL DATA:  Followup ischemic stroke. History of hypertension, hyperlipidemia, diabetes. EXAM: MRA HEAD WITHOUT CONTRAST TECHNIQUE: Angiographic images  of the Circle of Willis were obtained using MRA technique without intravenous contrast. COMPARISON:  MRI of the brain Jan 25, 2016 at 1428 hours FINDINGS: Anterior circulation: Normal flow related enhancement of the included cervical, petrous, cavernous and supraclinoid internal carotid arteries. Patent anterior communicating artery. A1 segment is dominant. Normal flow related enhancement of the anterior and middle cerebral arteries, including distal segments. No large vessel occlusion, high-grade stenosis, abnormal luminal irregularity, aneurysm. Posterior circulation: LEFT vertebral artery is dominant. Basilar artery is patent, with normal flow related enhancement of the main branch vessels. Fetal origin LEFT posterior cerebral artery. Normal flow related enhancement of the posterior cerebral arteries. No large vessel occlusion, high-grade stenosis, abnormal luminal irregularity, aneurysm. IMPRESSION: Negative MRA head. Electronically Signed   By: Elon Alas M.D.   On: 01/26/2016 00:05   Mr Brain Wo Contrast  01/25/2016  CLINICAL DATA:  Probable altered mental status. Confusion. Possible TIA EXAM: MRI HEAD WITHOUT CONTRAST TECHNIQUE: Multiplanar, multiecho pulse sequences of the brain and surrounding structures were obtained without intravenous contrast. COMPARISON:  CT head 01/25/2016 FINDINGS: Small foci  of restricted diffusion in the left hippocampus tail and left amygdala. These are most likely related to small foci of acute infarct. No other areas of acute infarct. Ventricle size normal. Cerebral volume normal. Several small white matter hyperintensities in the frontal lobes bilaterally consistent with minimal chronic microvascular ischemia. Brainstem and cerebellum intact. Negative for intracranial hemorrhage. Negative for fluid collection. Negative for mass or edema.  No shift of the midline structures. Pituitary normal in size. Mild mucosal edema paranasal sinuses. Normal orbit. Circle of Willis patent. IMPRESSION: Two small area of acute infarct in the left amygdala and tail of the hippocampus. These each measure approximately 3 mm. Minimal chronic microvascular ischemic changes in the white matter. Electronically Signed   By: Franchot Gallo M.D.   On: 01/25/2016 15:07    Lab Data:  CBC:  Recent Labs Lab 01/25/16 1041 01/25/16 1048  WBC 6.6  --   NEUTROABS 3.5  --   HGB 13.6 14.6  HCT 41.6 43.0  MCV 88.3  --   PLT 184  --    Basic Metabolic Panel:  Recent Labs Lab 01/25/16 1041 01/25/16 1048  NA 137 139  K 4.4 4.4  CL 106 107  CO2 22  --   GLUCOSE 200* 194*  BUN 15 17  CREATININE 0.77 0.70  CALCIUM 9.5  --    GFR: Estimated Creatinine Clearance: 76.5 mL/min (by C-G formula based on Cr of 0.7). Liver Function Tests:  Recent Labs Lab 01/25/16 1041  AST 27  ALT 27  ALKPHOS 103  BILITOT 0.6  PROT 6.5  ALBUMIN 3.7   No results for input(s): LIPASE, AMYLASE in the last 168 hours. No results for input(s): AMMONIA in the last 168 hours. Coagulation Profile:  Recent Labs Lab 01/25/16 1041  INR 1.12   Cardiac Enzymes: No results for input(s): CKTOTAL, CKMB, CKMBINDEX, TROPONINI in the last 168 hours. BNP (last 3 results) No results for input(s): PROBNP in the last 8760 hours. HbA1C: No results for input(s): HGBA1C in the last 72 hours. CBG:  Recent  Labs Lab 01/25/16 1051 01/25/16 2257 01/26/16 0825  GLUCAP 185* 122* 149*   Lipid Profile:  Recent Labs  01/26/16 0656  CHOL 246*  HDL 30*  LDLCALC 167*  TRIG 247*  CHOLHDL 8.2   Thyroid Function Tests: No results for input(s): TSH, T4TOTAL, FREET4, T3FREE, THYROIDAB in the last 72 hours. Anemia Panel:  No results for input(s): VITAMINB12, FOLATE, FERRITIN, TIBC, IRON, RETICCTPCT in the last 72 hours. Urine analysis:    Component Value Date/Time   COLORURINE AMBER* 10/27/2015 1128   APPEARANCEUR CLEAR 10/27/2015 1128   LABSPEC 1.025 10/27/2015 1128   PHURINE 8.0 10/27/2015 1128   GLUCOSEU NEGATIVE 10/27/2015 1128   GLUCOSEU NEGATIVE 12/14/2012 1213   HGBUR NEGATIVE 10/27/2015 1128   BILIRUBINUR NEG 01/08/2016 1212   BILIRUBINUR NEGATIVE 10/27/2015 1128   KETONESUR NEGATIVE 10/27/2015 1128   PROTEINUR NEG 01/08/2016 1212   PROTEINUR 30* 10/27/2015 1128   UROBILINOGEN negative 01/08/2016 1212   UROBILINOGEN 0.2 12/14/2012 1213   NITRITE NEG 01/08/2016 1212   NITRITE NEGATIVE 10/27/2015 1128   LEUKOCYTESUR Negative 01/08/2016 1212     RAI,RIPUDEEP M.D. [facility] 01/26/2016, 11:34 AM  Pager: [phone] Between 7am to 7pm - call Pager - [phone]  After 7pm go to [email or web address] - password [email or web address]  Call night coverage person covering after 7pm

## 2016-01-27 ENCOUNTER — Inpatient Hospital Stay (HOSPITAL_COMMUNITY): Payer: Medicare Other

## 2016-01-27 DIAGNOSIS — I639 Cerebral infarction, unspecified: Secondary | ICD-10-CM

## 2016-01-27 DIAGNOSIS — R4182 Altered mental status, unspecified: Secondary | ICD-10-CM | POA: Insufficient documentation

## 2016-01-27 LAB — BASIC METABOLIC PANEL
Anion gap: 11 (ref 5–15)
BUN: 16 mg/dL (ref 6–20)
CO2: 25 mmol/L (ref 22–32)
Calcium: 9.2 mg/dL (ref 8.9–10.3)
Chloride: 103 mmol/L (ref 101–111)
Creatinine, Ser: 0.75 mg/dL (ref 0.44–1.00)
GFR calc Af Amer: >60 mL/min (ref >60)
GFR calc non Af Amer: >60 mL/min (ref >60)
Glucose, Bld: 138 mg/dL — ABNORMAL HIGH (ref 65–99)
Potassium: 3.9 mmol/L (ref 3.5–5.1)
Sodium: 139 mmol/L (ref 135–145)

## 2016-01-27 LAB — CBC
HCT: 41 % (ref 36.0–46.0)
Hemoglobin: 13.7 g/dL (ref 12.0–15.0)
MCH: 29.7 pg (ref 26.0–34.0)
MCHC: 33.4 g/dL (ref 30.0–36.0)
MCV: 88.9 fL (ref 78.0–100.0)
Platelets: 178 10*3/uL (ref 150–400)
RBC: 4.61 MIL/uL (ref 3.87–5.11)
RDW: 13.6 % (ref 11.5–15.5)
WBC: 6.6 10*3/uL (ref 4.0–10.5)

## 2016-01-27 LAB — HEMOGLOBIN A1C
Hgb A1c MFr Bld: 6.9 % — ABNORMAL HIGH (ref 4.8–5.6)
Mean Plasma Glucose: 151 mg/dL

## 2016-01-27 LAB — GLUCOSE, CAPILLARY
Glucose-Capillary: 155 mg/dL — ABNORMAL HIGH (ref 65–99)
Glucose-Capillary: 117 mg/dL — ABNORMAL HIGH (ref 65–99)
Glucose-Capillary: 104 mg/dL — ABNORMAL HIGH (ref 65–99)
Glucose-Capillary: 146 mg/dL — ABNORMAL HIGH (ref 65–99)

## 2016-01-27 LAB — URINE CULTURE: Culture: >=100000 — AB

## 2016-01-27 NOTE — Procedures (Signed)
History: Allison Yoder is an 65 y.o. female patient with transient 2-3 minutes of altered mental status. Routine inpatient EEG was performed for further evaluation.   Patient Active Problem List   Diagnosis Date Noted  . Acute ischemic stroke ([place]) 01/25/2016  . Diet-controlled diabetes mellitus ([place]) 01/25/2016  . Rash and nonspecific skin eruption 01/10/2016  . History of UTI 01/10/2016  . Abdominal pain   . Community acquired pneumonia   . Pneumonia 10/27/2015  . Depression 10/27/2015  . CAP (community acquired pneumonia) 10/27/2015  . Essential hypertension 10/27/2015  . Advance care planning 04/29/2015  . OSA (obstructive sleep apnea) 07/25/2014  . Routine general medical examination at a health care facility 02/26/2014  . Palpitations 01/06/2013  . Paresthesia 01/06/2013  . Abdominal pain, unspecified site 12/17/2012  . Frequent loose stools 12/17/2012  . Skin lesion 09/01/2012  . Anxiety 09/01/2012  . Shoulder pain 12/19/2011  . MDD (major depressive disorder) ([place]) 12/19/2011  . Hyperglycemia 12/19/2011  . OA (osteoarthritis) 12/19/2011  . HLD (hyperlipidemia) 12/19/2011  . Migraines 12/19/2011  . Hypothyroid 12/19/2011  . Iritis 12/19/2011     Current facility-administered medications:  .  acetaminophen (TYLENOL) tablet 650 mg, 650 mg, Oral, Q6H PRN, Ripudeep K Rai, MD, 650 mg at 01/26/16 0932 .  albuterol (PROVENTIL) (2.5 MG/3ML) 0.083% nebulizer solution 3 mL, 3 mL, Inhalation, Q6H PRN, Samella Parr, NP .  aspirin EC tablet 325 mg, 325 mg, Oral, Daily, Samella Parr, NP, 325 mg at 01/27/16 [id number] .  atenolol (TENORMIN) tablet 50 mg, 50 mg, Oral, Daily, Samella Parr, NP, 50 mg at 01/27/16 0840 .  calcium-vitamin D (OSCAL WITH D) 500-200 MG-UNIT per tablet 1 tablet, 1 tablet, Oral, BID, Waldemar Dickens, MD, 1 tablet at 01/27/16 [phone] .  citalopram (CELEXA) tablet 20 mg, 20 mg, Oral, BID, Samella Parr, NP, 20 mg at 01/27/16 0839 .  enoxaparin (LOVENOX)  injection 40 mg, 40 mg, Subcutaneous, Q24H, Samella Parr, NP, 40 mg at 01/26/16 1740 .  ezetimibe-simvastatin (VYTORIN) 10-20 MG per tablet 1 tablet, 1 tablet, Oral, q1800, Ripudeep Krystal Eaton, MD, 1 tablet at 01/26/16 1829 .  fluticasone (FLONASE) 50 MCG/ACT nasal spray 2 spray, 2 spray, Each Nare, Daily, Samella Parr, NP, 2 spray at 01/27/16 [phone] .  insulin aspart (novoLOG) injection 0-5 Units, 0-5 Units, Subcutaneous, QHS, Samella Parr, NP, 0 Units at 01/25/16 2200 .  insulin aspart (novoLOG) injection 0-9 Units, 0-9 Units, Subcutaneous, TID WC, Samella Parr, NP, 2 Units at 01/27/16 [phone] .  lisinopril (PRINIVIL,ZESTRIL) tablet 10 mg, 10 mg, Oral, QHS, Samella Parr, NP, 10 mg at 01/26/16 2114 .  loratadine (CLARITIN) tablet 10 mg, 10 mg, Oral, Daily, Samella Parr, NP, 10 mg at 01/27/16 0839 .  multivitamin with minerals tablet 1 tablet, 1 tablet, Oral, Daily, Samella Parr, NP, 1 tablet at 01/27/16 [phone] .  nitrofurantoin (MACRODANTIN) capsule 100 mg, 100 mg, Oral, Daily, Samella Parr, NP, 100 mg at 01/27/16 0839 .  omeprazole (PRILOSEC) capsule 20 mg, 20 mg, Oral, Daily, Rhetta Mura Schorr, NP, 20 mg at 01/27/16 0839 .  pantoprazole (PROTONIX) EC tablet 40 mg, 40 mg, Oral, Daily, Samella Parr, NP, 40 mg at 01/27/16 0839 .  vitamin B-12 (CYANOCOBALAMIN) tablet 1,000 mcg, 1,000 mcg, Oral, Daily, Samella Parr, NP, 1,000 mcg at 01/27/16 [id number]   Introduction:  This is a 19 channel routine scalp EEG performed at the bedside with bipolar and monopolar montages  arranged in accordance to the international 10/20 system of electrode placement. One channel was dedicated to EKG recording.   Findings:  The background rhythm was normal 9-10 Hz alpha . No definite evidence of abnormal epileptiform discharges or electrographic seizures were noted during this recording.   Impression:  Unremarkable awake and drowsy routine inpatient EEG. Clinical correlation is recommended .

## 2016-01-27 NOTE — Progress Notes (Signed)
*  PRELIMINARY RESULTS* Vascular Ultrasound Carotid Duplex (Doppler) has been completed.  Preliminary findings: Bilateral: No significant (1-39%) ICA stenosis. Antegrade vertebral flow.    Landry Mellow, RDMS, RVT  01/27/2016, 9:56 AM

## 2016-01-27 NOTE — Care Management Important Message (Signed)
Important Message  Patient Details  Name: DELRAE HIMMELMAN MRN: [id number] Date of Birth: November 27, 1950   Medicare Important Message Given:  Yes    Sharin Mons, RN 01/27/2016, 11:22 AM

## 2016-01-27 NOTE — Progress Notes (Signed)
[facility]                                                                              Patient Demographics  Allison Yoder, is a 65 y.o. female, DOB - [birth year], [id number]  Admit date - 01/25/2016   Admitting Physician Waldemar Dickens, MD  Outpatient Primary MD for the patient is Elsie Stain, MD  Outpatient specialists:   LOS - 2  days    Chief Complaint  Patient presents with  . Altered Mental Status       Brief summary   Patient is a 65 year old female with recurrent UTIs, dyslipidemia not on statin, hypertension presented to ED with transient altered mental status this past weekend. Symptoms have resolved now. MRI of the brain was positive for 2 small areas of acute infarct in the left amygdala and tail of the hippocampus. Patient was admitted for further workup.   Assessment & Plan    Principal Problem:  Acute Ischemic CVA- presenting with Transient altered mental status over the weekend followed by headache - Delayed presentation hence not a TPA candidate. Neurology was consulted. - MRI brain showed for 2 small areas of acute infarct in the left amygdala and tail of the hippocampus, 3 mm - MRA showed normal intracranial circulation ** - 2-D echo pending - Discussed with neurology, Dr Silverio Decamp, recommended to hold off on her carotids until neurology evaluation. - Lipid panel showed LDL 167, goal less than 70, per patient she has been on different kind of statins in the past and has not tolerated any statin except the Vytorin, had not been taking it. Restarted Vytorin - Hemoglobin A1c pending - PT evaluation back to baseline - Continue aspirin 325 mg daily - Neurology has signed of   Active Problems:   HLD (hyperlipidemia) - LDL 167, restarted Vytorin    Essential hypertension -BP currently stable, continue atenolol, lisinopril     History of  recurrent UTI - Usually was negative for nitrite, moderate leukocytes. Urine culture is  growing lactobacillus species,  Continue nitrofurantoin    Diet-controlled diabetes mellitus ([place]) - Follow hemoglobin A1c  Code Status: *Full code   DVT Prophylaxis:  Lovenox   Family Communication: No family present at bedside   Disposition Plan: Hopefully next 24 hours  Time Spent in minutes  25 mins   Procedures:  MRI brain  MRA  Consultants:   Neurology   Antimicrobials: Prophylactic nitrofurantoin  Medications  Scheduled Meds: . aspirin EC  325 mg Oral Daily  . atenolol  50 mg Oral Daily  . calcium-vitamin D  1 tablet Oral BID  . citalopram  20 mg Oral BID  . enoxaparin (LOVENOX) injection  40 mg Subcutaneous Q24H  . ezetimibe-simvastatin  1 tablet Oral q1800  . fluticasone  2 spray Each Nare Daily  . insulin aspart  0-5 Units Subcutaneous QHS  . insulin aspart  0-9 Units Subcutaneous TID WC  . lisinopril  10 mg Oral QHS  . loratadine  10 mg Oral Daily  . multivitamin with minerals  1 tablet Oral Daily  . nitrofurantoin  100 mg Oral Daily  . omeprazole  20 mg Oral Daily  . pantoprazole  40 mg Oral Daily  . vitamin B-12  1,000 mcg Oral Daily   Continuous Infusions:  PRN Meds:.acetaminophen, albuterol   Antibiotics   Anti-infectives    None        Subjective:   Allison Yoder was seen and examined today.Denies any chest pain or shortness of breath. No numbness or tingling.  Objective:   Filed Vitals:   01/27/16 0917 01/27/16 1324 01/27/16 1325 01/27/16 1652  BP: 124/58 131/70  142/74  Pulse: 69 58  58  Temp: 98.6 F (37 C)  98.4 F (36.9 C) 98.3 F (36.8 C)  TempSrc: Oral  Oral Oral  Resp: 14 16  16   Height:      Weight:      SpO2: 98% 99%  99%    Intake/Output Summary (Last 24 hours) at 01/27/16 1945 Last data filed at 01/27/16 1925  Gross per 24 hour  Intake   1350 ml  Output      0 ml  Net   1350 ml     Wt Readings from Last 3 Encounters:  01/25/16 81.965 kg (180 lb 11.2 oz)  01/08/16 80.287 kg (177 lb)  11/13/15 81.307  kg (179 lb 4 oz)     Exam Physical Exam: Eyes: No icterus, extraocular muscles intact  Mouth: Oral mucosa is moist, no lesions on palate,  Neck: Supple, no deformities, masses, or tenderness Lungs: Normal respiratory effort, bilateral clear to auscultation, no crackles or wheezes.  Heart: Regular rate and rhythm, S1 and S2 normal, no murmurs, rubs auscultated Abdomen: BS normoactive,soft,nondistended,non-tender to palpation,no organomegaly Extremities: No pretibial edema, no erythema, no cyanosis, no clubbing Neuro : Alert and oriented to time, place and person, No focal deficits Skin: No rashes seen on exam   Data Reviewed:  I have personally reviewed following labs and imaging studies  Micro Results Recent Results (from the past 240 hour(s))  Urine culture     Status: Abnormal   Collection Time: 01/26/16 12:18 PM  Result Value Ref Range Status   Specimen Description URINE, RANDOM  Final   Special Requests NONE  Final   Culture (A)  Final    >=100,000 COLONIES/mL LACTOBACILLUS SPECIES Standardized susceptibility testing for this organism is not available.    Report Status 01/27/2016 FINAL  Final    Radiology Reports Dg Chest 2 View  01/07/2016  CLINICAL DATA:  Follow-up pneumonia. EXAM: CHEST  2 VIEW COMPARISON:  CT 10/29/2015.  Chest x-ray 10/27/2015. FINDINGS: Mediastinum hilar structures normal. Near complete clearing of right lower lobe infiltrate. No pleural effusion or pneumothorax. Biapical pleural-parenchymal thickening noted consistent scarring. Heart size stable. No acute bony abnormality . IMPRESSION: Near complete clearing of right lower lobe infiltrate. Electronically Signed   By: Saxon   On: 01/07/2016 12:00   Ct Head Wo Contrast  01/25/2016  CLINICAL DATA:  Memory loss. EXAM: CT HEAD WITHOUT CONTRAST TECHNIQUE: Contiguous axial images were obtained from the base of the skull through the vertex without intravenous contrast. COMPARISON:  None. FINDINGS:  Brain: Mild atrophy with sulcal prominence and prominence of the bifrontal extra-axial spaces. The gray-white differentiation is maintained without CT evidence of acute large territory infarct. No intraparenchymal extra-axial mass or hemorrhage. Normal size and configuration of the ventricles and basilar cisterns. Vascular: No hyperdense vessel or unexpected calcification. Skull: Negative for fracture or focal lesion. Sinuses/Orbits: Limited visualization the paranasal sinuses and mastoid air cells is normal. No air-fluid levels. Other: Regional soft  tissues appear normal. IMPRESSION: Mild atrophy without acute intracranial process. Electronically Signed   By: Sandi Mariscal M.D.   On: 01/25/2016 12:38   Mr Jodene Nam Head Wo Contrast  01/26/2016  CLINICAL DATA:  Followup ischemic stroke. History of hypertension, hyperlipidemia, diabetes. EXAM: MRA HEAD WITHOUT CONTRAST TECHNIQUE: Angiographic images of the Circle of Willis were obtained using MRA technique without intravenous contrast. COMPARISON:  MRI of the brain Jan 25, 2016 at 1428 hours FINDINGS: Anterior circulation: Normal flow related enhancement of the included cervical, petrous, cavernous and supraclinoid internal carotid arteries. Patent anterior communicating artery. A1 segment is dominant. Normal flow related enhancement of the anterior and middle cerebral arteries, including distal segments. No large vessel occlusion, high-grade stenosis, abnormal luminal irregularity, aneurysm. Posterior circulation: LEFT vertebral artery is dominant. Basilar artery is patent, with normal flow related enhancement of the main branch vessels. Fetal origin LEFT posterior cerebral artery. Normal flow related enhancement of the posterior cerebral arteries. No large vessel occlusion, high-grade stenosis, abnormal luminal irregularity, aneurysm. IMPRESSION: Negative MRA head. Electronically Signed   By: Elon Alas M.D.   On: 01/26/2016 00:05   Mr Brain Wo  Contrast  01/25/2016  CLINICAL DATA:  Probable altered mental status. Confusion. Possible TIA EXAM: MRI HEAD WITHOUT CONTRAST TECHNIQUE: Multiplanar, multiecho pulse sequences of the brain and surrounding structures were obtained without intravenous contrast. COMPARISON:  CT head 01/25/2016 FINDINGS: Small foci of restricted diffusion in the left hippocampus tail and left amygdala. These are most likely related to small foci of acute infarct. No other areas of acute infarct. Ventricle size normal. Cerebral volume normal. Several small white matter hyperintensities in the frontal lobes bilaterally consistent with minimal chronic microvascular ischemia. Brainstem and cerebellum intact. Negative for intracranial hemorrhage. Negative for fluid collection. Negative for mass or edema.  No shift of the midline structures. Pituitary normal in size. Mild mucosal edema paranasal sinuses. Normal orbit. Circle of Willis patent. IMPRESSION: Two small area of acute infarct in the left amygdala and tail of the hippocampus. These each measure approximately 3 mm. Minimal chronic microvascular ischemic changes in the white matter. Electronically Signed   By: Franchot Gallo M.D.   On: 01/25/2016 15:07    Lab Data:  CBC:  Recent Labs Lab 01/25/16 1041 01/25/16 1048 01/27/16 0520  WBC 6.6  --  6.6  NEUTROABS 3.5  --   --   HGB 13.6 14.6 13.7  HCT 41.6 43.0 41.0  MCV 88.3  --  88.9  PLT 184  --  0000000   Basic Metabolic Panel:  Recent Labs Lab 01/25/16 1041 01/25/16 1048 01/27/16 0520  NA 137 139 139  K 4.4 4.4 3.9  CL 106 107 103  CO2 22  --  25  GLUCOSE 200* 194* 138*  BUN 15 17 16   CREATININE 0.77 0.70 0.75  CALCIUM 9.5  --  9.2   GFR: Estimated Creatinine Clearance: 76.5 mL/min (by C-G formula based on Cr of 0.75). Liver Function Tests:  Recent Labs Lab 01/25/16 1041  AST 27  ALT 27  ALKPHOS 103  BILITOT 0.6  PROT 6.5  ALBUMIN 3.7   No results for input(s): LIPASE, AMYLASE in the last  168 hours. No results for input(s): AMMONIA in the last 168 hours. Coagulation Profile:  Recent Labs Lab 01/25/16 1041  INR 1.12   Cardiac Enzymes: No results for input(s): CKTOTAL, CKMB, CKMBINDEX, TROPONINI in the last 168 hours. BNP (last 3 results) No results for input(s): PROBNP in the last 8760 hours.  HbA1C:  Recent Labs  01/26/16 0556  HGBA1C 6.9*   CBG:  Recent Labs Lab 01/26/16 1813 01/26/16 2243 01/27/16 0734 01/27/16 1212 01/27/16 1622  GLUCAP 123* 125* 155* 117* 104*   Lipid Profile:  Recent Labs  01/26/16 0656  CHOL 246*  HDL 30*  LDLCALC 167*  TRIG 247*  CHOLHDL 8.2   Thyroid Function Tests: No results for input(s): TSH, T4TOTAL, FREET4, T3FREE, THYROIDAB in the last 72 hours. Anemia Panel: No results for input(s): VITAMINB12, FOLATE, FERRITIN, TIBC, IRON, RETICCTPCT in the last 72 hours. Urine analysis:    Component Value Date/Time   COLORURINE YELLOW 01/26/2016 1218   APPEARANCEUR HAZY* 01/26/2016 1218   LABSPEC 1.017 01/26/2016 1218   PHURINE 6.0 01/26/2016 [place] 01/26/2016 [place] 12/14/2012 1213   HGBUR TRACE* 01/26/2016 [place] 01/26/2016 1218   BILIRUBINUR NEG 01/08/2016 [place] 01/26/2016 [place] 01/26/2016 1218   PROTEINUR NEG 01/08/2016 1212   UROBILINOGEN negative 01/08/2016 1212   UROBILINOGEN 0.2 12/14/2012 1213   NITRITE NEGATIVE 01/26/2016 1218   NITRITE NEG 01/08/2016 1212   LEUKOCYTESUR MODERATE* 01/26/2016 1218     LAMA,GAGAN S M.D. [facility] 01/27/2016, 7:45 PM  Pager: [phone] Between 7am to 7pm - call Pager - [phone]  After 7pm go to [email or web address] - password [email or web address]  Call night coverage person covering after 7pm

## 2016-01-27 NOTE — Evaluation (Signed)
Speech Language Pathology Evaluation Patient Details Name: Allison Yoder MRN: [id number] DOB: 1951/07/28 Today's Date: 01/27/2016 Time: [id number] SLP Time Calculation (min) (ACUTE ONLY): 42 min  Problem List:  Patient Active Problem List   Diagnosis Date Noted  . Altered mental status   . Acute ischemic stroke ([place]) 01/25/2016  . Diet-controlled diabetes mellitus ([place]) 01/25/2016  . Rash and nonspecific skin eruption 01/10/2016  . History of UTI 01/10/2016  . Abdominal pain   . Community acquired pneumonia   . Pneumonia 10/27/2015  . Depression 10/27/2015  . CAP (community acquired pneumonia) 10/27/2015  . Essential hypertension 10/27/2015  . Advance care planning 04/29/2015  . OSA (obstructive sleep apnea) 07/25/2014  . Routine general medical examination at a health care facility 02/26/2014  . Palpitations 01/06/2013  . Paresthesia 01/06/2013  . Abdominal pain, unspecified site 12/17/2012  . Frequent loose stools 12/17/2012  . Skin lesion 09/01/2012  . Anxiety 09/01/2012  . Shoulder pain 12/19/2011  . MDD (major depressive disorder) ([place]) 12/19/2011  . Hyperglycemia 12/19/2011  . OA (osteoarthritis) 12/19/2011  . HLD (hyperlipidemia) 12/19/2011  . Migraines 12/19/2011  . Hypothyroid 12/19/2011  . Iritis 12/19/2011   Past Medical History:  Past Medical History  Diagnosis Date  . GERD (gastroesophageal reflux disease)   . Allergy   . Hypertension   . Hyperlipidemia   . History of chicken pox   . Urinary incontinence   . Recurrent urinary tract infection   . Iritis     per [facility]  . Diverticulosis   . Fatty liver   . Snores   . Wears glasses   . Anxiety   . Depression   . Heart murmur     "dx'd years ago; very mild; never treated" (10/27/2015)  . OSA on CPAP   . Pneumonia ~ 2013; 10/27/2015  . Hypothyroidism 2002-~ 2010  . Type II diabetes mellitus ([place])     "lost weight; started exercising again; don't have it anymore" (10/27/2015)  . Anemia      "all the time as a child"  . Migraines     "stopped when I went thru menopause"  . Arthritis     "all my joints; worse in my hands" (10/27/2015)  . Chronic lower back pain    Past Surgical History:  Past Surgical History  Procedure Laterality Date  . Pelvic floor repair  2001    "lift"  . Polypectomy  X 3    "bladder"  . Shoulder arthroscopy w/ rotator cuff repair Left 2013  . Shoulder arthroscopy w/ rotator cuff repair Right 2015  . Hemorrhoid banding  X 2  . Breast lumpectomy Right 1964    benign tumor,  . Vaginal hysterectomy  1992    Partial  . Tubal ligation  ~ 1986  . Knee arthroscopy Left 2016    "meniscus repair"  . Blepharoplasty Bilateral 2013; 2016   HPI:  Pt is a 65 y/o f admitted to the hospital after she had transient 2-3 minutes symptoms of altered mental status confusion and memory problems which have improved without any residual disc. MRI of the brain showed incidental left medial temporal punctate infarcts which I believe is the likely etiology for her transient symptoms.   Assessment / Plan / Recommendation Clinical Impression  Pt participated with cognitive-linguistic assessment which revealed normal function across all areas assessed. Pt scored 27/30 possible points on the [facility] Cognitive Assessment which is consistent with WNL performance noted using informal measures. Discussed utilizing rest breaks  as needed and consideration for return to work half days. Encouraged supervision with high-level cognitive-linguistic tasks upon initial return home to insure accuracy. Pt and her significant other verbalized understanding with results and recommendations.    SLP Assessment  Patient does not need any further Speech Lanaguage Pathology Services    Follow Up Recommendations  None    Frequency and Duration           SLP Evaluation Prior Functioning  Cognitive/Linguistic Baseline: Within functional limits Vocation: Full time employment    Cognition  Overall Cognitive Status: Within Functional Limits for tasks assessed Arousal/Alertness: Awake/alert Orientation Level: Oriented X4 Attention: Alternating Alternating Attention: Appears intact Memory: Appears intact Awareness: Appears intact Problem Solving: Appears intact Safety/Judgment: Appears intact    Comprehension  Auditory Comprehension Overall Auditory Comprehension: Appears within functional limits for tasks assessed Yes/No Questions: Within Functional Limits Commands: Within Functional Limits Conversation: Complex Reading Comprehension Reading Status: Within funtional limits    Expression Expression Primary Mode of Expression: Verbal Verbal Expression Overall Verbal Expression: Appears within functional limits for tasks assessed Initiation: No impairment Naming: No impairment Written Expression Written Expression: Within Functional Limits   Oral / Motor  Oral Motor/Sensory Function Overall Oral Motor/Sensory Function: Within functional limits Motor Speech Overall Motor Speech: Appears within functional limits for tasks assessed Respiration: Within functional limits Phonation: Normal Resonance: Within functional limits Articulation: Within functional limitis Intelligibility: Intelligible Motor Planning: Witnin functional limits   GO                    Vinetta Bergamo MA, CCC-SLP 01/27/2016, 4:42 PM

## 2016-01-27 NOTE — Progress Notes (Signed)
STROKE TEAM PROGRESS NOTE   HISTORY OF PRESENT ILLNESS Allison Yoder is an 65 y.o. female with medical history significant for recurrent UTIs on Macrodantin, dyslipidemia not on statin, hypertension on medication who presented to the ER with reports of transient altered mental status this past Saturday evening 5/13 (LKW, time unknown) when watching TV. She reports symptoms only lasted a few minutes and consisted of a dreamlike sensation without any focal motor neurological deficits or any sensory deficits. During the episode she was unable to recall her age, her significant other's, or her children's age. She was also having difficulty knowing exactly what the month and year was. Symptoms rapidly resolved and patient has remained at a normal baseline on arrival to ED. CT head without contrast showed Mild atrophy without acute and cardiac process. MRI brain without contrast showed 2 small areas of acute infarct in the left middle and tail of the hippocampus each measuring approximately 3 mm. Patient was not administered IV t-PA secondary to delay in arrival. She was admitted for further evaluation and treatment.   SUBJECTIVE (INTERVAL HISTORY) Her RN is at the bedside.  Overall she feels her condition is completely resolved. She has confusion, memory loss, and dreamlike feeling for 4-5 min. MRI showed left hippocampal infarct.    OBJECTIVE Temp:  [98 F (36.7 C)-98.6 F (37 C)] 98.4 F (36.9 C) (05/17 1325) Pulse Rate:  [58-69] 58 (05/17 1324) Cardiac Rhythm:  [-] Heart block (05/17 0700) Resp:  [14-19] 16 (05/17 1324) BP: (124-148)/(58-89) 131/70 mmHg (05/17 1324) SpO2:  [95 %-100 %] 99 % (05/17 1324)  CBC:  Recent Labs Lab 01/25/16 1041 01/25/16 1048 01/27/16 0520  WBC 6.6  --  6.6  NEUTROABS 3.5  --   --   HGB 13.6 14.6 13.7  HCT 41.6 43.0 41.0  MCV 88.3  --  88.9  PLT 184  --  0000000    Basic Metabolic Panel:  Recent Labs Lab 01/25/16 1041 01/25/16 1048 01/27/16 0520  NA  137 139 139  K 4.4 4.4 3.9  CL 106 107 103  CO2 22  --  25  GLUCOSE 200* 194* 138*  BUN 15 17 16   CREATININE 0.77 0.70 0.75  CALCIUM 9.5  --  9.2    Lipid Panel:    Component Value Date/Time   CHOL 246* 01/26/2016 0656   TRIG 247* 01/26/2016 0656   HDL 30* 01/26/2016 0656   CHOLHDL 8.2 01/26/2016 0656   VLDL 49* 01/26/2016 0656   LDLCALC 167* 01/26/2016 0656   HgbA1c:  Lab Results  Component Value Date   HGBA1C 6.9* 01/26/2016   Urine Drug Screen: No results found for: LABOPIA, COCAINSCRNUR, LABBENZ, AMPHETMU, THCU, LABBARB    IMAGING I have personally reviewed the radiological images below and agree with the radiology interpretations.  Mr Jodene Nam Head Wo Contrast 01/26/2016  Negative MRA head.   Mr Brain Wo Contrast 01/25/2016   Two small area of acute infarct in the left amygdala and tail of the hippocampus. These each measure approximately 3 mm. Minimal chronic microvascular ischemic changes in the white matter.   2D Echocardiogram  - Left ventricle: The cavity size was normal. Wall thickness was increased in a pattern of moderate LVH. Systolic function was vigorous. The estimated ejection fraction was in the range of 65% to 70%. Wall motion was normal; there were no regional wall motion abnormalities. Left ventricular diastolic function parameters were normal. - Atrial septum: No defect or patent foramen ovale was identified. Impressions:  No cardiac source of emboli was indentified..  Carotid Doppler   There is 1-39% bilateral ICA stenosis. Vertebral artery flow is antegrade.    EEG The background rhythm was normal 9-10 Hz alpha . No definite evidence of abnormal epileptiform discharges or electrographic seizures were noted during this recording.    PHYSICAL EXAM Physical exam  Temp:  [98.1 F (36.7 C)-98.6 F (37 C)] 98.3 F (36.8 C) (05/17 1652) Pulse Rate:  [58-69] 58 (05/17 1652) Resp:  [14-18] 16 (05/17 1652) BP: (124-142)/(58-75) 142/74 mmHg (05/17  1652) SpO2:  [95 %-100 %] 99 % (05/17 1652)  General - Well nourished, well developed, in no apparent distress.  Ophthalmologic - Sharp disc margins OU.   Cardiovascular - Regular rate and rhythm with no murmur.  Mental Status -  Level of arousal and orientation to time, place, and person were intact. Language including expression, naming, repetition, comprehension was assessed and found intact. Attention span and concentration were normal. Recent and remote memory were intact. Fund of Knowledge was assessed and was intact.  Cranial Nerves II - XII - II - Visual field intact OU. III, IV, VI - Extraocular movements intact. V - Facial sensation intact bilaterally. VII - Facial movement intact bilaterally. VIII - Hearing & vestibular intact bilaterally. X - Palate elevates symmetrically. XI - Chin turning & shoulder shrug intact bilaterally. XII - Tongue protrusion intact.  Motor Strength - The patient's strength was normal in all extremities and pronator drift was absent.  Bulk was normal and fasciculations were absent.   Motor Tone - Muscle tone was assessed at the neck and appendages and was normal.  Reflexes - The patient's reflexes were 1+ in all extremities and she had no pathological reflexes.  Sensory - Light touch, temperature/pinprick were assessed and were symmetrical    Coordination - The patient had normal movements in the hands and feet with no ataxia or dysmetria.  Tremor was absent.  Gait and Station - deferred.   ASSESSMENT/PLAN [name] is a 65 y.o. female with history of recurrent UTIs, dyslipidemia and hypertension  presenting with transient altered mental status. She did not receive IV t-PA due to delay in arrival.   Stroke:  Two punctate left hippocampal infarcts secondary to small vessel disease source  MRI  2 small L hippocampus & amygdala infarcts  MRA  Unremarkable   Carotid Doppler  No significant stenosis   2D Echo  EF 65-70%, No  source of embolus   EEG Unremarkable   LDL 167  HgbA1c 6.9  Lovenox 40 mg sq daily for VTE prophylaxis  Diet Heart Room service appropriate?: Yes; Fluid consistency:: Thin  No antithrombotic prior to admission, now on aspirin 325 mg daily.   Patient counseled to be compliant with her antithrombotic medications  Ongoing aggressive stroke risk factor management  Therapy recommendations:  pending  Disposition:  pending  Hypertension  Stable  Permissive hypertension (OK if < 220/120) but gradually normalize in 5-7 days  Hyperlipidemia  Home meds:  No statin  LDL 167, goal < 70  Now on vytorin  Continue vytorin at discharge  Diet controlled Diabetes type II  HgbA1c 6.9, goal < 7.0  Controlled  SSI  Other Stroke Risk Factors  Advanced age  Former Cigarette smoker, quit smoking 30 years ago   ETOH use  Family hx stroke (paternal aunt)  Migraines with visual aura many years ago  Obstructive sleep apnea on CPAP  Other Active Problems  Recurrent UTI  Hospital  day # 2  Neurology will sign off. Please call with questions. Pt will follow up with Dr. Erlinda Hong at [facility] in about 2 months. Thanks for the consult.  Rosalin Hawking, MD PhD Stroke Neurology 01/27/2016 6:57 PM    To contact Stroke Continuity provider, please refer to [email or web address] After hours, contact General Neurology

## 2016-01-28 LAB — BASIC METABOLIC PANEL
Anion gap: 9 (ref 5–15)
BUN: 17 mg/dL (ref 6–20)
CO2: 30 mmol/L (ref 22–32)
Calcium: 9.5 mg/dL (ref 8.9–10.3)
Chloride: 103 mmol/L (ref 101–111)
Creatinine, Ser: 0.81 mg/dL (ref 0.44–1.00)
GFR calc Af Amer: >60 mL/min (ref >60)
GFR calc non Af Amer: >60 mL/min (ref >60)
Glucose, Bld: 128 mg/dL — ABNORMAL HIGH (ref 65–99)
Potassium: 4.1 mmol/L (ref 3.5–5.1)
Sodium: 142 mmol/L (ref 135–145)

## 2016-01-28 LAB — CBC
HCT: 42.5 % (ref 36.0–46.0)
Hemoglobin: 14 g/dL (ref 12.0–15.0)
MCH: 29.3 pg (ref 26.0–34.0)
MCHC: 32.9 g/dL (ref 30.0–36.0)
MCV: 88.9 fL (ref 78.0–100.0)
Platelets: 181 10*3/uL (ref 150–400)
RBC: 4.78 MIL/uL (ref 3.87–5.11)
RDW: 13.7 % (ref 11.5–15.5)
WBC: 6.7 10*3/uL (ref 4.0–10.5)

## 2016-01-28 LAB — GLUCOSE, CAPILLARY
Glucose-Capillary: 122 mg/dL — ABNORMAL HIGH (ref 65–99)
Glucose-Capillary: 148 mg/dL — ABNORMAL HIGH (ref 65–99)

## 2016-01-28 MED ORDER — STROKE: EARLY STAGES OF RECOVERY BOOK
Freq: Once | Status: AC
  Administered 2016-01-28: 12:00:00
  Filled 2016-01-28: qty 1

## 2016-01-28 MED ORDER — METFORMIN HCL 500 MG PO TABS: 500.0000 mg | ORAL_TABLET | Freq: Two times a day (BID) | ORAL | Status: DC

## 2016-01-28 MED ORDER — EZETIMIBE-SIMVASTATIN 10-20 MG PO TABS: 1.0000 | ORAL_TABLET | Freq: Every day | ORAL | Status: DC

## 2016-01-28 MED ORDER — ASPIRIN 325 MG PO TBEC: 325.0000 mg | DELAYED_RELEASE_TABLET | Freq: Every day | ORAL | Status: DC

## 2016-01-28 NOTE — Care Management Note (Signed)
Case Management Note  Patient Details  Name: Allison DEJOSEPH MRN: [id number] Date of Birth: 04/03/1951  Subjective/Objective:                 Spoke with patient in the room, she states that she lives at home with her brother. She describes her stroke symtoms as losing track of time, not being oriented to moth or year, not remembering how old her boyfriend is, but not any weakness or difficulty walking or swallowing. Will have stroke work up today, and likely DC tomorrow. 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   Action/Plan:  DC to home, self care.  Expected Discharge Date:                  Expected Discharge Plan:  Home/Self Care  In-House Referral:     Discharge planning Services  CM Consult  Post Acute Care Choice:  NA Choice offered to:     DME Arranged:    DME Agency:     HH Arranged:    [place] Agency:     Status of Service:  Completed, signed off  Medicare Important Message Given:  Yes Date Medicare IM Given:    Medicare IM give by:    Date Additional Medicare IM Given:    Additional Medicare Important Message give by:     If discussed at [place] of Stay Meetings, dates discussed:    Additional Comments:  Carles Collet, RN 01/28/2016, 11:06 AM

## 2016-01-28 NOTE — Progress Notes (Signed)
Nsg Discharge Note  Admit Date:  01/25/2016 Discharge date: 01/28/2016   Allison Yoder to be D/C'd home per MD order.  AVS completed.  Copy for chart, and copy for patient signed, and dated. Stroke book reviewed and given to pt. Patient able to verbalize understanding.  Discharge Medication:   Medication List    TAKE these medications        albuterol 108 (90 Base) MCG/ACT inhaler  Commonly known as:  PROVENTIL HFA;VENTOLIN HFA  Inhale 1-2 puffs into the lungs every 6 (six) hours as needed for wheezing or shortness of breath.     aspirin 325 MG EC tablet  Take 1 tablet (325 mg total) by mouth daily.     atenolol 50 MG tablet  Commonly known as:  TENORMIN  Take 1 tablet (50 mg total) by mouth daily.     Calcium-Vitamin D 600-200 MG-UNIT tablet  Take 1 tablet by mouth 2 (two) times daily.     cetirizine 10 MG tablet  Commonly known as:  ZYRTEC  Take 10 mg by mouth daily as needed for allergies.     citalopram 20 MG tablet  Commonly known as:  CELEXA  Take 1 tablet (20 mg total) by mouth 2 (two) times daily.     ezetimibe-simvastatin 10-20 MG tablet  Commonly known as:  VYTORIN  Take 1 tablet by mouth daily at 6 PM.     fluticasone 50 MCG/ACT nasal spray  Commonly known as:  FLONASE  Place 2 sprays into both nostrils daily.     lisinopril 10 MG tablet  Commonly known as:  PRINIVIL,ZESTRIL  Take 1 tablet (10 mg total) by mouth at bedtime.     metFORMIN 500 MG tablet  Commonly known as:  GLUCOPHAGE  Take 1 tablet (500 mg total) by mouth 2 (two) times daily with a meal.     multivitamin tablet  Take 1 tablet by mouth daily.     nitrofurantoin 100 MG capsule  Commonly known as:  MACRODANTIN  Take 100 mg by mouth daily.     omeprazole 20 MG capsule  Commonly known as:  PRILOSEC  Take 20 mg by mouth daily.     vitamin B-12 1000 MCG tablet  Commonly known as:  CYANOCOBALAMIN  Take 1,000 mcg by mouth daily.        Discharge Assessment: Filed Vitals:   01/28/16 0514 01/28/16 0847  BP: 130/75 132/80  Pulse: 63 76  Temp: 98.4 F (36.9 C)   Resp: 16    Skin clean, dry and intact without evidence of skin break down, no evidence of skin tears noted. IV catheter discontinued with catheter tip intact. Site without signs and symptoms of complications - no redness or edema noted at insertion site, patient denies c/o pain - only slight tenderness at site.  Dressing with slight pressure applied.  D/c Instructions-Education: Discharge instructions given to patient with verbalized understanding. D/c education completed with patient including follow up instructions, medication list, d/c activities limitations if indicated, with other d/c instructions as indicated by MD - patient able to verbalize understanding, all questions fully answered. Patient instructed to return to ED, call 911, or call MD for any changes in condition.  Patient is dressed in her room waiting for her significant other to come and pick her up. Pt stated that he is 15 minutes away. Pt up in recliner at this time, recliner is locked in place. Call bell within reach. Will continue to monitor pt until her ride  arrives.   Dorita Fray, RN 01/28/2016 12:09 PM

## 2016-01-28 NOTE — Progress Notes (Signed)
RN ambulated with pt to emergency entrance where her significant other pulled up the car. Pt safely got in vehicle. Allison Yoder 01/28/2016 12:49 PM

## 2016-01-28 NOTE — Discharge Summary (Signed)
Physician Discharge Summary  Allison Yoder [id number] DOB: Apr 16, 1951 DOA: 01/25/2016  PCP: Elsie Stain, MD  Admit date: 01/25/2016 Discharge date: 01/28/2016  Time spent: *25 minutes  Recommendations for Outpatient Follow-up:  1. Follow up Neurology in 2  Months 2. Follow up PCP in 2 weeks   Discharge Diagnoses:  Principal Problem:   Acute ischemic stroke [facility]) Active Problems:   HLD (hyperlipidemia)   Essential hypertension   History of UTI   Diet-controlled diabetes mellitus ([place])   Altered mental status   Discharge Condition: Stable  Diet recommendation: Heart healthy diet  Filed Weights   01/25/16 1737  Weight: 81.965 kg (180 lb 11.2 oz)    History of present illness:  65 year old female with recurrent UTIs, dyslipidemia not on statin, hypertension presented to ED with transient altered mental status this past weekend. Symptoms have resolved now. MRI of the brain was positive for 2 small areas of acute infarct in the left amygdala and tail of the hippocampus. Patient was admitted for further workup.  Hospital Course:   Acute Ischemic CVA- presented  with Transient altered mental status over the weekend followed by headache - Delayed presentation hence not a TPA candidate. Neurology was consulted. - MRI brain showed for 2 small areas of acute infarct in the left amygdala and tail of the hippocampus, 3 mm - MRA showed normal intracranial circulation ** - 2-D echo showed no cardiac source of embolus - Lipid panel showed LDL 167, goal less than 70, per patient she has been on different kind of statins in the past and has not tolerated any statin except the Vytorin, had not been taking it. Restarted Vytorin - Hemoglobin A1c 6.9 - PT evaluation back to baseline - Continue aspirin 325 mg daily - Neurology has signed off, follow up Neurology in 2 months    HLD (hyperlipidemia) - LDL 167, restarted Vytorin   Essential hypertension -BP currently stable, continue  atenolol, lisinopril   History of recurrent UTI - Usually was negative for nitrite, moderate leukocytes. Urine culture is growing lactobacillus species, Continue nitrofurantoin   Diet-controlled diabetes mellitus ([place]) - Follow hemoglobin A1c 6.9, patient was taking Metformin 500 mg po bid at home, will restart Metformin.  Procedures:  Echocardiogram  Carotid Duplex (Doppler) has been completed. Preliminary findings: Bilateral: No significant (1-39%) ICA stenosis. Antegrade vertebral flow  Consultations:  Neurology   Discharge Exam: Filed Vitals:   01/28/16 0514 01/28/16 0847  BP: 130/75 132/80  Pulse: 63 76  Temp: 98.4 F (36.9 C)   Resp: 16     General: Appears in no acute distress Cardiovascular: S1S2 RRR Respiratory: Clear bilaterally  Discharge Instructions   Discharge Instructions    Ambulatory referral to Neurology    Complete by:  As directed   Pt will follow up with Dr. [facility]linda [facility] at [facility] in about 2 months. Thanks.     Diet - low sodium heart healthy    Complete by:  As directed      Increase activity slowly    Complete by:  As directed           Current Discharge Medication List    START taking these medications   Details  aspirin EC 325 MG EC tablet Take 1 tablet (325 mg total) by mouth daily. Qty: 30 tablet, Refills: 3    ezetimibe-simvastatin (VYTORIN) 10-20 MG tablet Take 1 tablet by mouth daily at 6 PM. Qty: 30 tablet, Refills: 3    metFORMIN (GLUCOPHAGE) 500 MG tablet Take 1  tablet (500 mg total) by mouth 2 (two) times daily with a meal. Qty: 60 tablet, Refills: 2      CONTINUE these medications which have NOT CHANGED   Details  albuterol (PROVENTIL HFA;VENTOLIN HFA) 108 (90 Base) MCG/ACT inhaler Inhale 1-2 puffs into the lungs every 6 (six) hours as needed for wheezing or shortness of breath. Qty: 1 Inhaler, Refills: 1    atenolol (TENORMIN) 50 MG tablet Take 1 tablet (50 mg total) by mouth daily. Qty: 90 tablet, Refills: 3     Calcium-Vitamin D 600-200 MG-UNIT per tablet Take 1 tablet by mouth 2 (two) times daily.    cetirizine (ZYRTEC) 10 MG tablet Take 10 mg by mouth daily as needed for allergies.     citalopram (CELEXA) 20 MG tablet Take 1 tablet (20 mg total) by mouth 2 (two) times daily. Qty: 30 tablet, Refills: 0    fluticasone (FLONASE) 50 MCG/ACT nasal spray Place 2 sprays into both nostrils daily. Qty: 16 g, Refills: 6    lisinopril (PRINIVIL,ZESTRIL) 10 MG tablet Take 1 tablet (10 mg total) by mouth at bedtime. Qty: 90 tablet, Refills: 3    Multiple Vitamin (MULTIVITAMIN) tablet Take 1 tablet by mouth daily.    nitrofurantoin (MACRODANTIN) 100 MG capsule Take 100 mg by mouth daily.     omeprazole (PRILOSEC) 20 MG capsule Take 20 mg by mouth daily.    vitamin B-12 (CYANOCOBALAMIN) 1000 MCG tablet Take 1,000 mcg by mouth daily.       Allergies  Allergen Reactions  . Cephalexin Itching  . Codeine Itching  . Inapsine [Droperidol] Shortness Of Breath, Anxiety and Hypertension    Elevated HR and BP, panic attack  . Lipitor [Atorvastatin Calcium]     Myalgias   . Niacin And Related     Flushing  . Pravastatin     Myalgias  . Adhesive [Tape] Rash    Blisters with tape   Follow-up Information    Follow up with Xu,Jindong, MD. Schedule an appointment as soon as possible for a visit in 2 months.   Specialty:  Neurology   Why:  stroke clinic   Contact information:   [address] [place] [address] [phone]       Follow up with Elsie Stain, MD On 02/18/2016.   Specialty:  Family Medicine   Why:  Appointment with Dr. Damita Dunnings is on 02/18/16 at 12:15pm   Contact information:   [place] [address] [phone]        The results of significant diagnostics from this hospitalization (including imaging, microbiology, ancillary and laboratory) are listed below for reference.    Significant Diagnostic Studies: Dg Chest 2 View  01/07/2016   CLINICAL DATA:  Follow-up pneumonia. EXAM: CHEST  2 VIEW COMPARISON:  CT 10/29/2015.  Chest x-ray 10/27/2015. FINDINGS: Mediastinum hilar structures normal. Near complete clearing of right lower lobe infiltrate. No pleural effusion or pneumothorax. Biapical pleural-parenchymal thickening noted consistent scarring. Heart size stable. No acute bony abnormality . IMPRESSION: Near complete clearing of right lower lobe infiltrate. Electronically Signed   By: Velarde   On: 01/07/2016 12:00   Ct Head Wo Contrast  01/25/2016  CLINICAL DATA:  Memory loss. EXAM: CT HEAD WITHOUT CONTRAST TECHNIQUE: Contiguous axial images were obtained from the base of the skull through the vertex without intravenous contrast. COMPARISON:  None. FINDINGS: Brain: Mild atrophy with sulcal prominence and prominence of the bifrontal extra-axial spaces. The gray-white differentiation is maintained without  CT evidence of acute large territory infarct. No intraparenchymal extra-axial mass or hemorrhage. Normal size and configuration of the ventricles and basilar cisterns. Vascular: No hyperdense vessel or unexpected calcification. Skull: Negative for fracture or focal lesion. Sinuses/Orbits: Limited visualization the paranasal sinuses and mastoid air cells is normal. No air-fluid levels. Other: Regional soft tissues appear normal. IMPRESSION: Mild atrophy without acute intracranial process. Electronically Signed   By: Sandi Mariscal M.D.   On: 01/25/2016 12:38   Mr Jodene Nam Head Wo Contrast  01/26/2016  CLINICAL DATA:  Followup ischemic stroke. History of hypertension, hyperlipidemia, diabetes. EXAM: MRA HEAD WITHOUT CONTRAST TECHNIQUE: Angiographic images of the Circle of Willis were obtained using MRA technique without intravenous contrast. COMPARISON:  MRI of the brain Jan 25, 2016 at 1428 hours FINDINGS: Anterior circulation: Normal flow related enhancement of the included cervical, petrous, cavernous and supraclinoid internal carotid  arteries. Patent anterior communicating artery. A1 segment is dominant. Normal flow related enhancement of the anterior and middle cerebral arteries, including distal segments. No large vessel occlusion, high-grade stenosis, abnormal luminal irregularity, aneurysm. Posterior circulation: LEFT vertebral artery is dominant. Basilar artery is patent, with normal flow related enhancement of the main branch vessels. Fetal origin LEFT posterior cerebral artery. Normal flow related enhancement of the posterior cerebral arteries. No large vessel occlusion, high-grade stenosis, abnormal luminal irregularity, aneurysm. IMPRESSION: Negative MRA head. Electronically Signed   By: Elon Alas M.D.   On: 01/26/2016 00:05   Mr Brain Wo Contrast  01/25/2016  CLINICAL DATA:  Probable altered mental status. Confusion. Possible TIA EXAM: MRI HEAD WITHOUT CONTRAST TECHNIQUE: Multiplanar, multiecho pulse sequences of the brain and surrounding structures were obtained without intravenous contrast. COMPARISON:  CT head 01/25/2016 FINDINGS: Small foci of restricted diffusion in the left hippocampus tail and left amygdala. These are most likely related to small foci of acute infarct. No other areas of acute infarct. Ventricle size normal. Cerebral volume normal. Several small white matter hyperintensities in the frontal lobes bilaterally consistent with minimal chronic microvascular ischemia. Brainstem and cerebellum intact. Negative for intracranial hemorrhage. Negative for fluid collection. Negative for mass or edema.  No shift of the midline structures. Pituitary normal in size. Mild mucosal edema paranasal sinuses. Normal orbit. Circle of Willis patent. IMPRESSION: Two small area of acute infarct in the left amygdala and tail of the hippocampus. These each measure approximately 3 mm. Minimal chronic microvascular ischemic changes in the white matter. Electronically Signed   By: Franchot Gallo M.D.   On: 01/25/2016 15:07     Microbiology: Recent Results (from the past 240 hour(s))  Urine culture     Status: Abnormal   Collection Time: 01/26/16 12:18 PM  Result Value Ref Range Status   Specimen Description URINE, RANDOM  Final   Special Requests NONE  Final   Culture (A)  Final    >=100,000 COLONIES/mL LACTOBACILLUS SPECIES Standardized susceptibility testing for this organism is not available.    Report Status 01/27/2016 FINAL  Final     Labs: Basic Metabolic Panel:  Recent Labs Lab 01/25/16 1041 01/25/16 1048 01/27/16 0520 01/28/16 0427  NA 137 139 139 142  K 4.4 4.4 3.9 4.1  CL 106 107 103 103  CO2 22  --  25 30  GLUCOSE 200* 194* 138* 128*  BUN 15 17 16 17   CREATININE 0.77 0.70 0.75 0.81  CALCIUM 9.5  --  9.2 9.5   Liver Function Tests:  Recent Labs Lab 01/25/16 1041  AST 27  ALT 27  ALKPHOS 103  BILITOT 0.6  PROT 6.5  ALBUMIN 3.7   No results for input(s): LIPASE, AMYLASE in the last 168 hours. No results for input(s): AMMONIA in the last 168 hours. CBC:  Recent Labs Lab 01/25/16 1041 01/25/16 1048 01/27/16 0520 01/28/16 0427  WBC 6.6  --  6.6 6.7  NEUTROABS 3.5  --   --   --   HGB 13.6 14.6 13.7 14.0  HCT 41.6 43.0 41.0 42.5  MCV 88.3  --  88.9 88.9  PLT 184  --  178 181   Cardiac Enzymes: No results for input(s): CKTOTAL, CKMB, CKMBINDEX, TROPONINI in the last 168 hours. BNP: BNP (last 3 results) No results for input(s): BNP in the last 8760 hours.  ProBNP (last 3 results) No results for input(s): PROBNP in the last 8760 hours.  CBG:  Recent Labs Lab 01/27/16 0734 01/27/16 1212 01/27/16 1622 01/27/16 2219 01/28/16 0747  GLUCAP 155* 117* 104* 146* 122*       Signed:  Oswald Hillock MD.  [facility] 01/28/2016, 11:12 AM

## 2016-01-29 ENCOUNTER — Telehealth: Payer: Self-pay

## 2016-01-29 NOTE — Telephone Encounter (Signed)
Transition Care Management Follow-up Telephone Call     Date discharged? 01/28/2016        How have you been since you were released from the hospital? Health status improving   Any patient concerns? N    Do you understand why you were in the hospital? Y   Do you understand the discharge instructions? Y   Where were you discharged to? Home   Items Reviewed:  Medications reviewed: Y  Allergies reviewed: Y  Dietary changes reviewed: n/a  Referrals reviewed: n/a   Functional Questionnaire:  Independent - I Dependent - D    Activities of Daily Living (ADLs):  Independent  Personal hygiene  Dressing Eating  Maintaining continence Transferring  Independent Activities of Daily Living (ADLs): Independent Basic communication skills Transportation Meal preparation  Shopping Housework  Managing medications Managing personal finances   Confirmed importance and date/time of follow-up visits scheduled YES  Provider Appointment booked with PCP 02/04/16 @ 11AM  Confirmed with patient if condition begins to worsen call PCP or go to the ER.  Patient was given the office number and encouraged to call back with question or concerns: YES

## 2016-02-04 ENCOUNTER — Encounter: Payer: Self-pay | Admitting: Family Medicine

## 2016-02-04 ENCOUNTER — Ambulatory Visit (INDEPENDENT_AMBULATORY_CARE_PROVIDER_SITE_OTHER): Payer: Medicare Other | Admitting: Family Medicine

## 2016-02-04 VITALS — BP 112/68 | HR 78 | Temp 98.7°F | Wt 178.5 lb

## 2016-02-04 DIAGNOSIS — I639 Cerebral infarction, unspecified: Secondary | ICD-10-CM

## 2016-02-04 DIAGNOSIS — Z5189 Encounter for other specified aftercare: Secondary | ICD-10-CM

## 2016-02-04 DIAGNOSIS — E119 Type 2 diabetes mellitus without complications: Secondary | ICD-10-CM | POA: Diagnosis not present

## 2016-02-04 MED ORDER — METFORMIN HCL 500 MG PO TABS
500.0000 mg | ORAL_TABLET | Freq: Every day | ORAL | Status: DC
Start: 2016-02-04 — End: 2016-05-06

## 2016-02-04 NOTE — Assessment & Plan Note (Signed)
Goal for secondary prevention.  Unclear if the fatigue was metformin related.  Would likely be okay to cut metformin back to 1 pill a day for now.  Recheck A1c in about 3 months.  Continue statin for now.  Recheck lipids later in 2017.  Continue aspirin 325mg  a day.  No ADE on med.  MRI noted with CVA.  Echo and carotid study prev done.  Continue other meds, already on ACE.   All d/w pt.  All questions answered.  No residual neuro deficit at this point, okay to drive, exercise, work, etc.  Exercise as tolerated, d/w pt.   She'll have f/u neuro appointment later in 2017.

## 2016-02-04 NOTE — Progress Notes (Signed)
Pre visit review using our clinic review tool, if applicable. No additional management support is needed unless otherwise documented below in the visit note.    Discharge Diagnoses:  Principal Problem:  Acute ischemic stroke [facility]) Active Problems:  HLD (hyperlipidemia)  Essential hypertension  History of UTI  Diet-controlled diabetes mellitus ([place])  Altered mental status   Discharge Condition: Stable  Diet recommendation: Heart healthy diet  Filed Weights   01/25/16 1737  Weight: 81.965 kg (180 lb 11.2 oz)    History of present illness:   65 year old female with recurrent UTIs, dyslipidemia not on statin, hypertension presented to ED with transient altered mental status this past weekend. Symptoms have resolved now. MRI of the brain was positive for 2 small areas of acute infarct in the left amygdala and tail of the hippocampus. Patient was admitted for further workup.  Hospital Course:   Acute Ischemic CVA- presentedwith Transient altered mental status over the weekend followed by headache - Delayed presentation hence not a TPA candidate. Neurology was consulted. - MRI brain showed for 2 small areas of acute infarct in the left amygdala and tail of the hippocampus, 3 mm - MRA showed normal intracranial circulation - 2-D echo showed no cardiac source of embolus - Lipid panel showed LDL 167, goal less than 70, per patient she has been on different kind of statins in the past and has not tolerated any statin except the Vytorin, had not been taking it. Restarted Vytorin - Hemoglobin A1c 6.9 - PT evaluation back to baseline - Continue aspirin 325 mg daily - Neurology has signed off, follow up Neurology in 2 months   HLD (hyperlipidemia) - LDL 167, restarted Vytorin   Essential hypertension -BP currently stable, continue atenolol, lisinopril   History of recurrent UTI - Usually was negative for nitrite, moderate leukocytes. Urine culture is growing  lactobacillus species, Continue nitrofurantoin   Diet-controlled diabetes mellitus ([place]) - Follow hemoglobin A1c 6.9, patient was taking Metformin 500 mg po bid at home, will restart Metformin.  Procedures:  Echocardiogram  Carotid Duplex (Doppler) has been completed. Preliminary findings: Bilateral: No significant (1-39%) ICA stenosis. Antegrade vertebral flow  Consultations:  Neurology  Discharge Exam: Filed Vitals:   01/28/16 0514 01/28/16 0847  BP: 130/75 132/80  Pulse: 63 76  Temp: 98.4 F (36.9 C)   Resp: 16     General: Appears in no acute distress Cardiovascular: S1S2 RRR Respiratory: Clear bilaterally  Discharge Instructions   Discharge Instructions    Ambulatory referral to Neurology  Complete by: As directed   Pt will follow up with Dr. Erlinda Hong at [facility] in about 2 months. Thanks.     Diet - low sodium heart healthy  Complete by: As directed      Increase activity slowly  Complete by: As directed           Current Discharge Medication List    START taking these medications   Details  aspirin EC 325 MG EC tablet Take 1 tablet (325 mg total) by mouth daily. Qty: 30 tablet, Refills: 3    ezetimibe-simvastatin (VYTORIN) 10-20 MG tablet Take 1 tablet by mouth daily at 6 PM. Qty: 30 tablet, Refills: 3    metFORMIN (GLUCOPHAGE) 500 MG tablet Take 1 tablet (500 mg total) by mouth 2 (two) times daily with a meal. Qty: 60 tablet, Refills: 2      CONTINUE these medications which have NOT CHANGED   Details  albuterol (PROVENTIL HFA;VENTOLIN HFA) 108 (90 Base) MCG/ACT inhaler Inhale  1-2 puffs into the lungs every 6 (six) hours as needed for wheezing or shortness of breath. Qty: 1 Inhaler, Refills: 1    atenolol (TENORMIN) 50 MG tablet Take 1 tablet (50 mg total) by mouth daily. Qty: 90 tablet, Refills: 3    Calcium-Vitamin D 600-200 MG-UNIT per tablet Take 1 tablet by mouth 2 (two) times  daily.    cetirizine (ZYRTEC) 10 MG tablet Take 10 mg by mouth daily as needed for allergies.     citalopram (CELEXA) 20 MG tablet Take 1 tablet (20 mg total) by mouth 2 (two) times daily. Qty: 30 tablet, Refills: 0    fluticasone (FLONASE) 50 MCG/ACT nasal spray Place 2 sprays into both nostrils daily. Qty: 16 g, Refills: 6    lisinopril (PRINIVIL,ZESTRIL) 10 MG tablet Take 1 tablet (10 mg total) by mouth at bedtime. Qty: 90 tablet, Refills: 3    Multiple Vitamin (MULTIVITAMIN) tablet Take 1 tablet by mouth daily.    nitrofurantoin (MACRODANTIN) 100 MG capsule Take 100 mg by mouth daily.     omeprazole (PRILOSEC) 20 MG capsule Take 20 mg by mouth daily.    vitamin B-12 (CYANOCOBALAMIN) 1000 MCG tablet Take 1,000 mcg by mouth daily.       D/w pt about the above, MRI findings with CVA, echo and carotid study unremarkable.  No residual deficits, ie no neuro deficits but has had some hot flashes and fatigue in the meantime.  No new neuro sx in the meantime.  Now on metformin and vytorin.  Med list reviewed with patient, with recent labs.  D/w pt about goals re: lipids and A1c.  On ASA 325mg  now.    D/w pt about DM2/HLD/HTN and antiplatelet tx with regards to limiting risk for another CVA.  Sugar has been ~90-110s at home.  Taking metformin BID.  A1c had been improved on last check, now <7, d/w pt.   Meds, vitals, and allergies reviewed.   ROS: Per HPI unless specifically indicated in ROS section   GEN: nad, alert and oriented HEENT: mucous membranes moist, PERRL EOMI, OP wnl NECK: supple w/o LA CV: rrr.  PULM: ctab, no inc wob ABD: soft, +bs EXT: no edema CN 2-12 wnl B, S/S/DTR wnl x4

## 2016-02-04 NOTE — Patient Instructions (Signed)
Cut the metformin back to 1 pill a day.  Update me as needed.  Recheck in about 3 months.  Labs ahead of time.  Take care.  Glad to see you.

## 2016-02-18 ENCOUNTER — Ambulatory Visit: Payer: Medicare Other | Admitting: Family Medicine

## 2016-03-16 DIAGNOSIS — H833X3 Noise effects on inner ear, bilateral: Secondary | ICD-10-CM | POA: Insufficient documentation

## 2016-03-16 DIAGNOSIS — H9113 Presbycusis, bilateral: Secondary | ICD-10-CM | POA: Insufficient documentation

## 2016-04-08 ENCOUNTER — Encounter: Payer: Self-pay | Admitting: Neurology

## 2016-04-08 ENCOUNTER — Ambulatory Visit (INDEPENDENT_AMBULATORY_CARE_PROVIDER_SITE_OTHER): Payer: Medicare Other | Admitting: Neurology

## 2016-04-08 VITALS — BP 101/64 | HR 64 | Ht 66.5 in | Wt 176.6 lb

## 2016-04-08 DIAGNOSIS — E785 Hyperlipidemia, unspecified: Secondary | ICD-10-CM

## 2016-04-08 DIAGNOSIS — G4733 Obstructive sleep apnea (adult) (pediatric): Secondary | ICD-10-CM | POA: Diagnosis not present

## 2016-04-08 DIAGNOSIS — I63332 Cerebral infarction due to thrombosis of left posterior cerebral artery: Secondary | ICD-10-CM | POA: Diagnosis not present

## 2016-04-08 DIAGNOSIS — I10 Essential (primary) hypertension: Secondary | ICD-10-CM

## 2016-04-08 DIAGNOSIS — Z9989 Dependence on other enabling machines and devices: Secondary | ICD-10-CM

## 2016-04-08 DIAGNOSIS — Z8673 Personal history of transient ischemic attack (TIA), and cerebral infarction without residual deficits: Secondary | ICD-10-CM | POA: Insufficient documentation

## 2016-04-08 NOTE — Patient Instructions (Addendum)
-   continue ASA and vytorin for stroke prevention - check BP and glucose at home - Follow up with your primary care physician for stroke risk factor modification. Recommend maintain blood pressure goal <130/80, diabetes with hemoglobin A1c goal below 7.0% and lipids with LDL cholesterol goal below 70 mg/dL.  - healthy diet and regular exercise and no over exertion - continue celexa for post stroke depression  - use CPAP for OSA - follow up in 4 months.

## 2016-04-08 NOTE — Progress Notes (Signed)
STROKE NEUROLOGY FOLLOW UP NOTE  NAME: Allison Yoder DOB: [birth year]  REASON FOR VISIT: stroke follow up HISTORY FROM: pt and chart  Today we had the pleasure of seeing Allison Yoder in follow-up at our Neurology Clinic. Pt was accompanied by no one.   History Summary Allison Yoder is a 65 y.o. female with history of recurrent UTIs, OSA on CPAP, occular migraine, dyslipidemia and hypertension was admitted on 01/25/16 for transient confusion lasting 3 min followed by extreme fatigue. MRI showed two punctate left hippocampal and amygdala infarcts, likely due to small vessel disease. MRA, CUS, TTE, and EEG and remarkable. LDL 167 and A1c 6.9. She was put on aspirin 325 and continued Vytorin. She was discharged in good condition  Interval History During the interval time, the patient has been doing well.  On aspirin and Vytorin, complained of some depressed mood after discharge, currently on Celexa 20 mg daily. BP and sugar level at home in stable today BP 101/69. Still easily feel tired, has back to work full time.   REVIEW OF SYSTEMS: Full 14 system review of systems performed and notable only for those listed below and in HPI above, all others are negative:  Constitutional:   Appetite change, fatigue Cardiovascular:  Ear/Nose/Throat:   Ringing years Skin:  rash Eyes:   Blurry vision Respiratory:   Gastroitestinal:   Nausea Genitourinary:  incontinence of bladder Hematology/Lymphatic:   Bleeding easily Endocrine:  heat intolerance Musculoskeletal:   Allergy/Immunology:   Neurological:   Memory loss, headache Psychiatric:  confusion, decreased concentration, depression Sleep:  apnea, snoring  The following represents the patient's updated allergies and side effects list: Allergies  Allergen Reactions  . Cephalexin Itching  . Codeine Itching  . Inapsine [Droperidol] Shortness Of Breath, Anxiety and Hypertension    Elevated HR and BP, panic attack  . Lipitor  [Atorvastatin Calcium]     Myalgias   . Niacin And Related     Flushing  . Pravastatin     Myalgias  . Adhesive [Tape] Rash    Blisters with tape    The neurologically relevant items on the patient's problem list were reviewed on today's visit.  Neurologic Examination  A problem focused neurological exam (12 or more points of the single system neurologic examination, vital signs counts as 1 point, cranial nerves count for 8 points) was performed.  Blood pressure 101/64, pulse 64, height 5' 6.5" (1.689 m), weight 176 lb 9.6 oz (80.1 kg).  General - Well nourished, well developed, in no apparent distress.  Ophthalmologic - Sharp disc margins OU.   Cardiovascular - Regular rate and rhythm with no murmur.  Mental Status -  Level of arousal and orientation to time, place, and person were intact. Language including expression, naming, repetition, comprehension was assessed and found intact. Attention span and concentration were normal. Recent and remote memory were intact. Fund of Knowledge was assessed and was intact.  Cranial Nerves II - XII - II - Visual field intact OU. III, IV, VI - Extraocular movements intact. V - Facial sensation intact bilaterally. VII - Facial movement intact bilaterally. VIII - Hearing & vestibular intact bilaterally. X - Palate elevates symmetrically. XI - Chin turning & shoulder shrug intact bilaterally. XII - Tongue protrusion intact.  Motor Strength - The patient's strength was normal in all extremities and pronator drift was absent.  Bulk was normal and fasciculations were absent.   Motor Tone - Muscle tone was assessed at the neck and appendages  and was normal.  Reflexes - The patient's reflexes were 1+ in all extremities and she had no pathological reflexes.  Sensory - Light touch, temperature/pinprick, vibration and proprioception, and Romberg testing were assessed and were normal.    Coordination - The patient had normal movements in the  hands and feet with no ataxia or dysmetria.  Tremor was absent.  Gait and Station - The patient's transfers, posture, gait, station, and turns were observed as normal.   Functional score  mRS = 0   0 - No symptoms.   1 - No significant disability. Able to carry out all usual activities, despite some symptoms.   2 - Slight disability. Able to look after own affairs without assistance, but unable to carry out all previous activities.   3 - Moderate disability. Requires some help, but able to walk unassisted.   4 - Moderately severe disability. Unable to attend to own bodily needs without assistance, and unable to walk unassisted.   5 - Severe disability. Requires constant nursing care and attention, bedridden, incontinent.   6 - Dead.   NIH Stroke Scale = 0   Data reviewed: I personally reviewed the images and agree with the radiology interpretations.  Mr Jodene Nam Head Wo Contrast 01/26/2016  Negative MRA head.   Mr Brain Wo Contrast 01/25/2016   Two small area of acute infarct in the left amygdala and tail of the hippocampus. These each measure approximately 3 mm. Minimal chronic microvascular ischemic changes in the white matter.   2D Echocardiogram  - Left ventricle: The cavity size was normal. Wall thickness was increased in a pattern of moderate LVH. Systolic function was vigorous. The estimated ejection fraction was in the range of 65% to 70%. Wall motion was normal; there were no regional wall motion abnormalities. Left ventricular diastolic function parameters were normal. - Atrial septum: No defect or patent foramen ovale was identified. Impressions:   No cardiac source of emboli was indentified..  Carotid Doppler   There is 1-39% bilateral ICA stenosis. Vertebral artery flow is antegrade.    EEG The background rhythm was normal 9-10 Hz alpha . No definite evidence of abnormal epileptiform discharges or electrographic seizures were noted during this recording.    Component     Latest Ref Rng & Units 01/26/2016  Cholesterol     0 - 200 mg/dL 246 (H)  Triglycerides     <150 mg/dL 247 (H)  HDL Cholesterol     >40 mg/dL 30 (L)  Total CHOL/HDL Ratio     RATIO 8.2  VLDL     0 - 40 mg/dL 49 (H)  LDL (calc)     0 - 99 mg/dL 167 (H)  Hemoglobin A1C     4.8 - 5.6 % 6.9 (H)  Mean Plasma Glucose     mg/dL 151    Assessment: As you may recall, she is a 65 y.o. Caucasian female with PMH of recurrent UTIs, OSA on CPAP, occular migraine, dyslipidemia and hypertension was admitted on 01/25/16 for transient confusion lasting 3 min followed by extreme fatigue. MRI showed two punctate left hippocampal and amygdala infarcts, likely due to small vessel disease. MRA, CUS, TTE, and EEG and remarkable. LDL 167 and A1c 6.9. She was put on aspirin 325 and continued Vytorin. She was discharged in good condition. Stroke risk factor including HTN, HLD, OSA on CPAP, and ocular migraine. During the interval time, she has back to work full time. Complains of post stroke fatigue and the post  stroke depression, on Celexa 20.  Plan:  - continue ASA and vytorin for stroke prevention - check BP and glucose at home - Follow up with your primary care physician for stroke risk factor modification. Recommend maintain blood pressure goal <130/80, diabetes with hemoglobin A1c goal below 7.0% and lipids with LDL cholesterol goal below 70 mg/dL.  - healthy diet and regular exercise and no over exertion - continue celexa for post stroke depression  - use CPAP for OSA - follow up in 4 months.   I spent more than 25 minutes of face to face time with the patient. Greater than 50% of time was spent in counseling and coordination of care. We discussed post stroke fatigue and post stroke depression, continue stroke risk factor modification.   No orders of the defined types were placed in this encounter.   Meds ordered this encounter  Medications  . hydrocortisone (ANUSOL-HC) 25 MG  suppository    Sig: Place 25 mg rectally.  . phenazopyridine (PYRIDIUM) 200 MG tablet    Sig: Take 200 mg by mouth.  . clonazePAM (KLONOPIN) 0.5 MG tablet    Sig: Take by mouth.    Patient Instructions  - continue ASA and vytorin for stroke prevention - check BP and glucose at home - Follow up with your primary care physician for stroke risk factor modification. Recommend maintain blood pressure goal <130/80, diabetes with hemoglobin A1c goal below 7.0% and lipids with LDL cholesterol goal below 70 mg/dL.  - healthy diet and regular exercise and no over exertion - continue celexa for post stroke depression  - use CPAP for OSA - follow up in 4 months.    Rosalin Hawking, MD PhD [facility] [address], [place], [place] [address] [phone]

## 2016-05-04 ENCOUNTER — Other Ambulatory Visit (INDEPENDENT_AMBULATORY_CARE_PROVIDER_SITE_OTHER): Payer: Medicare Other

## 2016-05-04 DIAGNOSIS — E119 Type 2 diabetes mellitus without complications: Secondary | ICD-10-CM | POA: Diagnosis not present

## 2016-05-04 LAB — LIPID PANEL
Cholesterol: 159 mg/dL (ref 0–200)
HDL: 38.2 mg/dL — ABNORMAL LOW (ref >39.00)
LDL Cholesterol: 84 mg/dL (ref 0–99)
NonHDL: 120.83
Total CHOL/HDL Ratio: 4
Triglycerides: 184 mg/dL — ABNORMAL HIGH (ref 0.0–149.0)
VLDL: 36.8 mg/dL (ref 0.0–40.0)

## 2016-05-04 LAB — HEMOGLOBIN A1C: Hgb A1c MFr Bld: 6.3 % (ref 4.6–6.5)

## 2016-05-06 ENCOUNTER — Encounter: Payer: Self-pay | Admitting: Family Medicine

## 2016-05-06 ENCOUNTER — Ambulatory Visit (INDEPENDENT_AMBULATORY_CARE_PROVIDER_SITE_OTHER): Payer: Medicare Other | Admitting: Family Medicine

## 2016-05-06 VITALS — BP 114/70 | HR 81 | Temp 98.6°F | Wt 177.8 lb

## 2016-05-06 DIAGNOSIS — E1159 Type 2 diabetes mellitus with other circulatory complications: Secondary | ICD-10-CM

## 2016-05-06 DIAGNOSIS — E785 Hyperlipidemia, unspecified: Secondary | ICD-10-CM

## 2016-05-06 DIAGNOSIS — F329 Major depressive disorder, single episode, unspecified: Secondary | ICD-10-CM

## 2016-05-06 DIAGNOSIS — Z1239 Encounter for other screening for malignant neoplasm of breast: Secondary | ICD-10-CM | POA: Diagnosis not present

## 2016-05-06 DIAGNOSIS — E2839 Other primary ovarian failure: Secondary | ICD-10-CM | POA: Diagnosis not present

## 2016-05-06 DIAGNOSIS — F32A Depression, unspecified: Secondary | ICD-10-CM

## 2016-05-06 DIAGNOSIS — Z23 Encounter for immunization: Secondary | ICD-10-CM | POA: Diagnosis not present

## 2016-05-06 DIAGNOSIS — E119 Type 2 diabetes mellitus without complications: Secondary | ICD-10-CM

## 2016-05-06 NOTE — Progress Notes (Signed)
Diabetes:  Using medications without difficulties: yes Hypoglycemic episodes: no Hyperglycemic episodes:no Feet problems:no Blood Sugars averaging: ~100-120s eye exam within last year: done <6 months ago, no retinopathy  A1c d/w pt.  At goal.    Elevated Cholesterol: Using medications without problems:yes Muscle aches: no Diet compliance:yes Exercise:yes Lipid much improved.   occ dry cough, unclear if from ACE.  D/w pt.    Still with fatigue.  Sleeping 8-10 hours at night.  Her mood is lower and that may contribute.  She has seen psych in the meantime and will call about f/u.    Mammogram and DXA ordered.  D/w pt.   Flu to be done at work.  PNA vaccine today.    Meds, vitals, and allergies reviewed.   ROS: Per HPI unless specifically indicated in ROS section   GEN: nad, alert and oriented HEENT: mucous membranes moist NECK: supple w/o LA CV: rrr. PULM: ctab, no inc wob ABD: soft, +bs EXT: no edema SKIN: no acute rash  Diabetic foot exam: Normal inspection No skin breakdown No calluses  Normal DP pulses Normal sensation to light touch and monofilament Nails normal

## 2016-05-06 NOTE — Progress Notes (Signed)
Pre visit review using our clinic review tool, if applicable. No additional management support is needed unless otherwise documented below in the visit note. 

## 2016-05-06 NOTE — Patient Instructions (Signed)
Let us know when you get the flu shot at work.  Allison Yoder will call about your referral. Recheck A1c in about 3 months.  Take care.  Glad to see you.

## 2016-05-08 DIAGNOSIS — Z1239 Encounter for other screening for malignant neoplasm of breast: Secondary | ICD-10-CM | POA: Insufficient documentation

## 2016-05-08 DIAGNOSIS — E2839 Other primary ovarian failure: Secondary | ICD-10-CM | POA: Insufficient documentation

## 2016-05-08 NOTE — Assessment & Plan Note (Signed)
Mammogram and DXA ordered.

## 2016-05-08 NOTE — Assessment & Plan Note (Addendum)
A1c d/w pt.  At goal.  Continue as is.  She agrees.  No change in meds. occ dry cough, unclear if from ACE.  D/w pt, would be reasonable to continue ACE for now.  We can follow along, she'll update me if worse.  >25 minutes spent in face to face time with patient, >50% spent in counselling or coordination of care.

## 2016-05-08 NOTE — Assessment & Plan Note (Signed)
She'll call the psychiatry clinic about follow-up. It is likely that some of her depression is affecting her energy level. At this point still okay for outpatient follow-up. She agrees.

## 2016-05-08 NOTE — Assessment & Plan Note (Signed)
Lipid much improved. Continue as is.

## 2016-05-19 ENCOUNTER — Other Ambulatory Visit: Payer: Self-pay | Admitting: *Deleted

## 2016-05-19 MED ORDER — METFORMIN HCL 500 MG PO TABS: 500.0000 mg | ORAL_TABLET | Freq: Two times a day (BID) | ORAL | 3 refills | Status: DC

## 2016-05-27 ENCOUNTER — Other Ambulatory Visit: Payer: Medicare Other

## 2016-05-27 ENCOUNTER — Ambulatory Visit: Payer: Medicare Other

## 2016-05-30 ENCOUNTER — Other Ambulatory Visit: Payer: Self-pay | Admitting: Family Medicine

## 2016-05-31 ENCOUNTER — Telehealth: Payer: Self-pay | Admitting: Neurology

## 2016-05-31 DIAGNOSIS — F329 Major depressive disorder, single episode, unspecified: Secondary | ICD-10-CM

## 2016-05-31 DIAGNOSIS — F32A Depression, unspecified: Secondary | ICD-10-CM

## 2016-05-31 NOTE — Telephone Encounter (Signed)
Talked with pt over the phone. She stated that since she saw me in 03/2016, she started to feel more depressed. She felt lack of energy, although she is back to full time job, she totally exhausted back to work, not able to do anything and straight to bed. She does not want to get out of bed. She can not get good sleep, wakes up a lot. She sometimes eat like a pig. She felt depressed but no SI or HI. She was given instructions to call suicidal hotline or 911 if she has any SI or HI. She stated that she would not do stupid things.   She is on celexa 20mg  bid, maximized dose currently. I stated that I would refer her to psychiatry and psychology for further evaluation and treatment. She also needs to come to our lab for check TSH, free T4 and B12. Her TSH one year ago was normal and she has no B12 level checked in the past.   Pt expressed understanding and appreciation.  Rosalin Hawking, MD PhD Stroke Neurology 05/31/2016 6:31 PM  Orders Placed This Encounter  Procedures  . Vitamin B12  . TSH + free T4  . Folate  . Ambulatory referral to Psychiatry    Referral Priority:   Urgent    Referral Type:   Psychiatric    Referral Reason:   Specialty Services Required    Requested Specialty:   Psychiatry    Number of Visits Requested:   1  . Ambulatory referral to Psychology    Referral Priority:   Routine    Referral Type:   Psychiatric    Referral Reason:   Specialty Services Required    Requested Specialty:   Psychology    Number of Visits Requested:   1

## 2016-05-31 NOTE — Addendum Note (Signed)
Addended by: Rosalin Hawking on: 05/31/2016 06:36 PM   Modules accepted: Orders

## 2016-05-31 NOTE — Telephone Encounter (Signed)
Rn call patient back about her concerns. Pt was seen in July 2017. Pt stated she is having a lot of depression. She has talk with her PCP about the increase depression and he advise her to call Dr. Erlinda Hong. Pt is taking the celexa daily.PT stated sometimes she does not want to get out of bed. Rn stated a message will be sent to Dr. Erlinda Hong. Pt verbalized understanding.

## 2016-05-31 NOTE — Telephone Encounter (Signed)
The pt called said she wanted to make appt with Dr Erlinda Hong. I asked her if it was a f/u to the last OV in July, she said no. I asked what will he be seeing you for, she said I would rather not discuss it on the phone. I relayed to her that I could not schedule an appt for new symptoms and not know what he will see her for but I would have the RN to call her. She understood

## 2016-06-01 ENCOUNTER — Telehealth: Payer: Self-pay | Admitting: Neurology

## 2016-06-01 NOTE — Telephone Encounter (Signed)
Thanks a lot, Engineer, building services.   Rosalin Hawking, MD PhD Stroke Neurology 06/01/2016 12:44 PM

## 2016-06-01 NOTE — Telephone Encounter (Signed)
Called and spoke to patient this morning and she relayed to me that she wanted to go to and psychiatrist  Or  Psychologist in [place] system. I relayed to patient that was fine.   Dr. Norton Pastel telephone [phone] sent as urgent  [facility] [phone] . Per [facility]. Patient needed to call and get a ASAP apt . Patient relayed to me that she would call today. Patient understood all details.

## 2016-06-02 ENCOUNTER — Other Ambulatory Visit (INDEPENDENT_AMBULATORY_CARE_PROVIDER_SITE_OTHER): Payer: Self-pay

## 2016-06-02 ENCOUNTER — Other Ambulatory Visit: Payer: Self-pay

## 2016-06-02 DIAGNOSIS — Z0289 Encounter for other administrative examinations: Secondary | ICD-10-CM

## 2016-06-02 DIAGNOSIS — F329 Major depressive disorder, single episode, unspecified: Secondary | ICD-10-CM

## 2016-06-02 DIAGNOSIS — F32A Depression, unspecified: Secondary | ICD-10-CM

## 2016-06-03 LAB — TSH+FREE T4
Free T4: 0.93 ng/dL (ref 0.82–1.77)
TSH: 6 u[IU]/mL — ABNORMAL HIGH (ref 0.450–4.500)

## 2016-06-03 LAB — VITAMIN B12: Vitamin B-12: 1006 pg/mL — ABNORMAL HIGH (ref 211–946)

## 2016-06-03 LAB — FOLATE: Folate: >20 ng/mL (ref >3.0)

## 2016-06-06 ENCOUNTER — Other Ambulatory Visit: Payer: Self-pay | Admitting: *Deleted

## 2016-06-06 ENCOUNTER — Encounter: Payer: Self-pay | Admitting: Family Medicine

## 2016-06-06 ENCOUNTER — Ambulatory Visit (INDEPENDENT_AMBULATORY_CARE_PROVIDER_SITE_OTHER): Payer: Medicare Other | Admitting: Family Medicine

## 2016-06-06 DIAGNOSIS — J0100 Acute maxillary sinusitis, unspecified: Secondary | ICD-10-CM | POA: Diagnosis not present

## 2016-06-06 MED ORDER — EZETIMIBE-SIMVASTATIN 10-20 MG PO TABS: 1.0000 | ORAL_TABLET | Freq: Every day | ORAL | 5 refills | Status: DC

## 2016-06-06 MED ORDER — AMOXICILLIN-POT CLAVULANATE 875-125 MG PO TABS: 1.0000 | ORAL_TABLET | Freq: Two times a day (BID) | ORAL | 0 refills | Status: DC

## 2016-06-06 NOTE — Assessment & Plan Note (Signed)
D/w pt.  Nontoxic.  No sign of pna on exam, ctab.  L max ttp, can tolerate augmentin- has tolerated prev.  D/w pt.  Start augmentin, use SABA prn, fu prn.  She agrees.  Okay for outpatient f/u.  She can get a flu shot later on, she agrees.

## 2016-06-06 NOTE — Patient Instructions (Signed)
Rest and fluids, augmentin, albuterol. Update me as needed.  Take care.  Glad to see you.

## 2016-06-06 NOTE — Progress Notes (Signed)
Pre visit review using our clinic review tool, if applicable. No additional management support is needed unless otherwise documented below in the visit note. 

## 2016-06-06 NOTE — Progress Notes (Signed)
Sx started about 2 weeks ago.  First noted fatigue.  Then noted aches, cough, rhinorrhea.  No ear pain.  No ST recently except for some irritation attributed to CPAP.  Feels hot, but no documented fevers.  Some sweats.  She isn't clearly getting worse but isn't resolved yet.  Taking mucinex.  H/o CAP.  She hasn't used SABA yet.    She has itching with keflex but can take augmentin.  D/w pt.   Meds, vitals, and allergies reviewed.   ROS: Per HPI unless specifically indicated in ROS section   GEN: nad, alert and oriented HEENT: mucous membranes moist, tm w/o erythema, nasal exam w/o erythema, clear discharge noted,  OP with cobblestoning, L max sinus ttp NECK: supple w/o LA CV: rrr.   PULM: ctab, no inc wob EXT: no edema

## 2016-06-09 ENCOUNTER — Inpatient Hospital Stay: Admission: RE | Admit: 2016-06-09 | Payer: Medicare Other | Source: Ambulatory Visit

## 2016-06-09 ENCOUNTER — Ambulatory Visit: Payer: Medicare Other

## 2016-06-14 ENCOUNTER — Other Ambulatory Visit: Payer: Self-pay | Admitting: Family Medicine

## 2016-06-14 ENCOUNTER — Telehealth: Payer: Self-pay | Admitting: *Deleted

## 2016-06-14 MED ORDER — FLUCONAZOLE 150 MG PO TABS: 150.0000 mg | ORAL_TABLET | Freq: Once | ORAL | 0 refills | Status: AC

## 2016-06-14 NOTE — Addendum Note (Signed)
Addended by: Tonia Ghent on: 06/14/2016 01:59 PM   Modules accepted: Orders

## 2016-06-14 NOTE — Telephone Encounter (Signed)
Patient was phoned to clarify refill requests on 2 medications from the pharmacy and patient states that she had called in this morning requesting Diflucan because she is taking ABX and has a bad yeast infection.  A prior request may have been made but I don't see it.

## 2016-06-14 NOTE — Telephone Encounter (Signed)
Sent. Thanks.   

## 2016-06-14 NOTE — Telephone Encounter (Signed)
Pt left v/m; pt seen 06/06/16 and has "raging yeast infection" and request diflucan to [facility]. Pt request cb.

## 2016-08-12 ENCOUNTER — Ambulatory Visit (INDEPENDENT_AMBULATORY_CARE_PROVIDER_SITE_OTHER): Payer: Medicare Other | Admitting: Neurology

## 2016-08-12 ENCOUNTER — Encounter: Payer: Self-pay | Admitting: Neurology

## 2016-08-12 VITALS — BP 115/80 | HR 70 | Wt 185.2 lb

## 2016-08-12 DIAGNOSIS — I63332 Cerebral infarction due to thrombosis of left posterior cerebral artery: Secondary | ICD-10-CM

## 2016-08-12 DIAGNOSIS — F32A Depression, unspecified: Secondary | ICD-10-CM

## 2016-08-12 DIAGNOSIS — F329 Major depressive disorder, single episode, unspecified: Secondary | ICD-10-CM

## 2016-08-12 DIAGNOSIS — G4733 Obstructive sleep apnea (adult) (pediatric): Secondary | ICD-10-CM

## 2016-08-12 DIAGNOSIS — I10 Essential (primary) hypertension: Secondary | ICD-10-CM

## 2016-08-12 DIAGNOSIS — Z9989 Dependence on other enabling machines and devices: Secondary | ICD-10-CM | POA: Diagnosis not present

## 2016-08-12 NOTE — Patient Instructions (Signed)
-   continue ASA and vytorin for stroke prevention - check BP and glucose at home - Follow up with your primary care physician for stroke risk factor modification. Recommend maintain blood pressure goal <130/80, diabetes with hemoglobin A1c goal below 7.0% and lipids with LDL cholesterol goal below 70 mg/dL.  - healthy diet and regular exercise - continue celexa for post stroke depression  - continue CPAP for OSA - follow up in 6 months.

## 2016-08-12 NOTE — Progress Notes (Signed)
STROKE NEUROLOGY FOLLOW UP NOTE  NAME: Allison Yoder DOB: 09-03-1951  REASON FOR VISIT: stroke follow up HISTORY FROM: pt and chart  Today we had the pleasure of seeing Allison Yoder in follow-up at our Neurology Clinic. Pt was accompanied by no one.   History Summary Allison Yoder is a 65 y.o. female with history of recurrent UTIs, OSA on CPAP, occular migraine, dyslipidemia and hypertension was admitted on 01/25/16 for transient confusion lasting 3 min followed by extreme fatigue. MRI showed two punctate left hippocampal and amygdala infarcts, likely due to small vessel disease. MRA, CUS, TTE, and EEG and remarkable. LDL 167 and A1c 6.9. She was put on aspirin 325 and continued Vytorin. She was discharged in good condition.  Follow up 04/08/16 - the patient has been doing well.  On aspirin and Vytorin, complained of some depressed mood after discharge, currently on Celexa 20 mg daily. BP and sugar level at home in stable today BP 101/69. Still easily feel tired, has back to work full time.   Interval History During the interval time, the pt has been doing well. Complaining of easy crying and short tempered since the stroke. Still felt some depressed mood, has been on celexa 20mg  bid for a long time. Does not want more medication. She decreased ASA to 162mg  as 325mg  makes her bruise badly. BP stable at home, today 115/80. Admit stress at work.   REVIEW OF SYSTEMS: Full 14 system review of systems performed and notable only for those listed below and in HPI above, all others are negative:  Constitutional:   Cardiovascular:  Ear/Nose/Throat:   Skin:   Eyes:   Respiratory:   Gastroitestinal:    Genitourinary:   Hematology/Lymphatic:   Endocrine:   Musculoskeletal:   Allergy/Immunology:   Neurological:  Psychiatric:   Sleep:   The following represents the patient's updated allergies and side effects list: Allergies  Allergen Reactions  . Cephalexin Itching  . Codeine  Itching  . Inapsine [Droperidol] Shortness Of Breath, Anxiety and Hypertension    Elevated HR and BP, panic attack  . Lipitor [Atorvastatin Calcium]     Myalgias   . Niacin And Related     Flushing  . Pravastatin     Myalgias  . Adhesive [Tape] Rash    Blisters with tape    The neurologically relevant items on the patient's problem list were reviewed on today's visit.  Neurologic Examination  A problem focused neurological exam (12 or more points of the single system neurologic examination, vital signs counts as 1 point, cranial nerves count for 8 points) was performed.  Blood pressure 115/80, pulse 70, weight 185 lb 3.2 oz (84 kg).  General - Well nourished, well developed, in no apparent distress.  Ophthalmologic - Sharp disc margins OU.   Cardiovascular - Regular rate and rhythm with no murmur.  Mental Status -  Level of arousal and orientation to time, place, and person were intact. Language including expression, naming, repetition, comprehension was assessed and found intact. Attention span and concentration were normal. Recent and remote memory were intact. Fund of Knowledge was assessed and was intact.  Cranial Nerves II - XII - II - Visual field intact OU. III, IV, VI - Extraocular movements intact. V - Facial sensation intact bilaterally. VII - Facial movement intact bilaterally. VIII - Hearing & vestibular intact bilaterally. X - Palate elevates symmetrically. XI - Chin turning & shoulder shrug intact bilaterally. XII - Tongue protrusion intact.  Motor  Strength - The patient's strength was normal in all extremities and pronator drift was absent.  Bulk was normal and fasciculations were absent.   Motor Tone - Muscle tone was assessed at the neck and appendages and was normal.  Reflexes - The patient's reflexes were 1+ in all extremities and she had no pathological reflexes.  Sensory - Light touch, temperature/pinprick, vibration and proprioception, and  Romberg testing were assessed and were normal.    Coordination - The patient had normal movements in the hands and feet with no ataxia or dysmetria.  Tremor was absent.  Gait and Station - The patient's transfers, posture, gait, station, and turns were observed as normal.   Data reviewed: I personally reviewed the images and agree with the radiology interpretations.  Mr Allison Yoder Head Wo Contrast 01/26/2016  Negative MRA head.   Mr Brain Wo Contrast 01/25/2016   Two small area of acute infarct in the left amygdala and tail of the hippocampus. These each measure approximately 3 mm. Minimal chronic microvascular ischemic changes in the white matter.   2D Echocardiogram  - Left ventricle: The cavity size was normal. Wall thickness was increased in a pattern of moderate LVH. Systolic function was vigorous. The estimated ejection fraction was in the range of 65% to 70%. Wall motion was normal; there were no regional wall motion abnormalities. Left ventricular diastolic function parameters were normal. - Atrial septum: No defect or patent foramen ovale was identified. Impressions:   No cardiac source of emboli was indentified..  Carotid Doppler   There is 1-39% bilateral ICA stenosis. Vertebral artery flow is antegrade.    EEG The background rhythm was normal 9-10 Hz alpha . No definite evidence of abnormal epileptiform discharges or electrographic seizures were noted during this recording.   Component     Latest Ref Rng & Units 01/26/2016  Cholesterol     0 - 200 mg/dL 246 (H)  Triglycerides     <150 mg/dL 247 (H)  HDL Cholesterol     >40 mg/dL 30 (L)  Total CHOL/HDL Ratio     RATIO 8.2  VLDL     0 - 40 mg/dL 49 (H)  LDL (calc)     0 - 99 mg/dL 167 (H)  Hemoglobin A1C     4.8 - 5.6 % 6.9 (H)  Mean Plasma Glucose     mg/dL 151    Assessment: As you may recall, she is a 65 y.o. Caucasian female with PMH of recurrent UTIs, OSA on CPAP, occular migraine, dyslipidemia and hypertension  was admitted on 01/25/16 for transient confusion lasting 3 min followed by extreme fatigue. MRI showed two punctate left hippocampal and amygdala infarcts, likely due to small vessel disease. MRA, CUS, TTE, and EEG and remarkable. LDL 167 and A1c 6.9. She was put on aspirin 325 and continued Vytorin. She was discharged in good condition. Stroke risk factor including HTN, HLD, OSA on CPAP, and ocular migraine. During the interval time, she has back to work full time. Complains of post stroke fatigue and the post stroke depression, on Celexa 20mg  bid. Also complaining of easy crying and short tempered since the stroke. Denies PBA. Now on ASA162mg  due to bruise badly on ASA 325mg .  Plan:  - continue ASA and vytorin for stroke prevention - check BP and glucose at home - Follow up with your primary care physician for stroke risk factor modification. Recommend maintain blood pressure goal <130/80, diabetes with hemoglobin A1c goal below 7.0% and lipids with LDL  cholesterol goal below 70 mg/dL.  - healthy diet and regular exercise - continue celexa for post stroke depression  - continue CPAP for OSA - follow up in 6 months.   I spent more than 25 minutes of face to face time with the patient. Greater than 50% of time was spent in counseling and coordination of care. We discussed post stroke fatigue and post stroke depression, ASA dosage, and post stroke mood changes.   No orders of the defined types were placed in this encounter.   No orders of the defined types were placed in this encounter.   Patient Instructions  - continue ASA and vytorin for stroke prevention - check BP and glucose at home - Follow up with your primary care physician for stroke risk factor modification. Recommend maintain blood pressure goal <130/80, diabetes with hemoglobin A1c goal below 7.0% and lipids with LDL cholesterol goal below 70 mg/dL.  - healthy diet and regular exercise - continue celexa for post stroke depression    - continue CPAP for OSA - follow up in 6 months.    Rosalin Hawking, MD PhD [facility] [address] [place], [place] [address] [phone]

## 2016-08-26 LAB — HM MAMMOGRAPHY

## 2016-08-26 LAB — HM DEXA SCAN

## 2016-09-16 ENCOUNTER — Other Ambulatory Visit: Payer: Self-pay | Admitting: *Deleted

## 2016-09-16 MED ORDER — METFORMIN HCL 500 MG PO TABS: 500.0000 mg | ORAL_TABLET | Freq: Two times a day (BID) | ORAL | 1 refills | Status: DC

## 2016-11-02 ENCOUNTER — Other Ambulatory Visit (INDEPENDENT_AMBULATORY_CARE_PROVIDER_SITE_OTHER): Payer: Medicare Other

## 2016-11-02 DIAGNOSIS — E119 Type 2 diabetes mellitus without complications: Secondary | ICD-10-CM | POA: Diagnosis not present

## 2016-11-02 LAB — HEMOGLOBIN A1C: Hgb A1c MFr Bld: 7 % — ABNORMAL HIGH (ref 4.6–6.5)

## 2016-11-03 ENCOUNTER — Ambulatory Visit: Payer: Medicare Other

## 2016-11-04 ENCOUNTER — Ambulatory Visit: Payer: Medicare Other | Admitting: Family Medicine

## 2016-11-04 ENCOUNTER — Other Ambulatory Visit: Payer: Self-pay

## 2016-11-04 MED ORDER — LISINOPRIL 10 MG PO TABS: 10.0000 mg | ORAL_TABLET | Freq: Every day | ORAL | 0 refills | Status: DC

## 2016-11-04 MED ORDER — METFORMIN HCL 500 MG PO TABS: 500.0000 mg | ORAL_TABLET | Freq: Two times a day (BID) | ORAL | 0 refills | Status: DC

## 2016-11-04 MED ORDER — CITALOPRAM HYDROBROMIDE 20 MG PO TABS: 20.0000 mg | ORAL_TABLET | Freq: Two times a day (BID) | ORAL | 0 refills | Status: DC

## 2016-11-04 MED ORDER — EZETIMIBE-SIMVASTATIN 10-20 MG PO TABS: 1.0000 | ORAL_TABLET | Freq: Every day | ORAL | 0 refills | Status: DC

## 2016-11-11 ENCOUNTER — Ambulatory Visit (INDEPENDENT_AMBULATORY_CARE_PROVIDER_SITE_OTHER): Payer: Medicare Other

## 2016-11-11 ENCOUNTER — Ambulatory Visit (INDEPENDENT_AMBULATORY_CARE_PROVIDER_SITE_OTHER): Payer: Medicare Other | Admitting: Family Medicine

## 2016-11-11 ENCOUNTER — Encounter: Payer: Self-pay | Admitting: Family Medicine

## 2016-11-11 VITALS — BP 118/80 | HR 69 | Temp 98.6°F | Ht 66.5 in | Wt 187.2 lb

## 2016-11-11 DIAGNOSIS — R1084 Generalized abdominal pain: Secondary | ICD-10-CM | POA: Diagnosis not present

## 2016-11-11 DIAGNOSIS — Z0000 Encounter for general adult medical examination without abnormal findings: Secondary | ICD-10-CM | POA: Diagnosis not present

## 2016-11-11 DIAGNOSIS — F32A Depression, unspecified: Secondary | ICD-10-CM

## 2016-11-11 DIAGNOSIS — E1159 Type 2 diabetes mellitus with other circulatory complications: Secondary | ICD-10-CM | POA: Diagnosis not present

## 2016-11-11 DIAGNOSIS — I639 Cerebral infarction, unspecified: Secondary | ICD-10-CM | POA: Diagnosis not present

## 2016-11-11 DIAGNOSIS — R5383 Other fatigue: Secondary | ICD-10-CM

## 2016-11-11 DIAGNOSIS — F329 Major depressive disorder, single episode, unspecified: Secondary | ICD-10-CM

## 2016-11-11 DIAGNOSIS — Z7189 Other specified counseling: Secondary | ICD-10-CM

## 2016-11-11 LAB — CBC WITH DIFFERENTIAL/PLATELET
Basophils Absolute: 0 10*3/uL (ref 0.0–0.1)
Basophils Relative: 0.6 % (ref 0.0–3.0)
Eosinophils Absolute: 0.1 10*3/uL (ref 0.0–0.7)
Eosinophils Relative: 1.5 % (ref 0.0–5.0)
HCT: 41.2 % (ref 36.0–46.0)
Hemoglobin: 13.9 g/dL (ref 12.0–15.0)
Lymphocytes Relative: 36.2 % (ref 12.0–46.0)
Lymphs Abs: 2.8 10*3/uL (ref 0.7–4.0)
MCHC: 33.7 g/dL (ref 30.0–36.0)
MCV: 90.8 fl (ref 78.0–100.0)
Monocytes Absolute: 0.5 10*3/uL (ref 0.1–1.0)
Monocytes Relative: 6.8 % (ref 3.0–12.0)
Neutro Abs: 4.2 10*3/uL (ref 1.4–7.7)
Neutrophils Relative %: 54.9 % (ref 43.0–77.0)
Platelets: 213 10*3/uL (ref 150.0–400.0)
RBC: 4.54 Mil/uL (ref 3.87–5.11)
RDW: 13.6 % (ref 11.5–15.5)
WBC: 7.7 10*3/uL (ref 4.0–10.5)

## 2016-11-11 LAB — COMPREHENSIVE METABOLIC PANEL
ALT: 41 U/L — ABNORMAL HIGH (ref 0–35)
AST: 30 U/L (ref 0–37)
Albumin: 4.2 g/dL (ref 3.5–5.2)
Alkaline Phosphatase: 92 U/L (ref 39–117)
BUN: 17 mg/dL (ref 6–23)
CO2: 30 mEq/L (ref 19–32)
Calcium: 9.5 mg/dL (ref 8.4–10.5)
Chloride: 103 mEq/L (ref 96–112)
Creatinine, Ser: 0.78 mg/dL (ref 0.40–1.20)
GFR: 78.46 mL/min (ref >60.00)
Glucose, Bld: 241 mg/dL — ABNORMAL HIGH (ref 70–99)
Potassium: 4 mEq/L (ref 3.5–5.1)
Sodium: 138 mEq/L (ref 135–145)
Total Bilirubin: 0.4 mg/dL (ref 0.2–1.2)
Total Protein: 6.9 g/dL (ref 6.0–8.3)

## 2016-11-11 LAB — TSH: TSH: 3.02 u[IU]/mL (ref 0.35–4.50)

## 2016-11-11 NOTE — Patient Instructions (Signed)
Ms. Mitrovic , Thank you for taking time to come for your Medicare Wellness Visit. I appreciate your ongoing commitment to your health goals. Please review the following plan we discussed and let me know if I can assist you in the future.   These are the goals we discussed: Goals    . Increase physical activity          Starting 11/11/2016, I will continue to exercise at least 45 min 4 days per week.        This is a list of the screening recommended for you and due dates:  Health Maintenance  Topic Date Due  . Eye exam for diabetics  01/09/2017*  . Hemoglobin A1C  05/02/2017  . Complete foot exam   05/06/2017  . Mammogram  09/12/2018  . Tetanus Vaccine  06/25/2022  . Colon Cancer Screening  04/16/2025  . Flu Shot  Addressed  . DEXA scan (bone density measurement)  Addressed  .  Hepatitis C: One time screening is recommended by Center for Disease Control  (CDC) for  adults born from [birth year] through 1966.   Completed  . Pneumonia vaccines  Completed  *Topic was postponed. The date shown is not the original due date.   Preventive Care for Adults  A healthy lifestyle and preventive care can promote health and wellness. Preventive health guidelines for adults include the following key practices.  . A routine yearly physical is a good way to check with your health care provider about your health and preventive screening. It is a chance to share any concerns and updates on your health and to receive a thorough exam.  . Visit your dentist for a routine exam and preventive care every 6 months. Brush your teeth twice a day and floss once a day. Good oral hygiene prevents tooth decay and gum disease.  . The frequency of eye exams is based on your age, health, family medical history, use  of contact lenses, and other factors. Follow your health care provider's ecommendations for frequency of eye exams.  . Eat a healthy diet. Foods like vegetables, fruits, whole grains, low-fat dairy products, and  lean protein foods contain the nutrients you need without too many calories. Decrease your intake of foods high in solid fats, added sugars, and salt. Eat the right amount of calories for you. Get information about a proper diet from your health care provider, if necessary.  . Regular physical exercise is one of the most important things you can do for your health. Most adults should get at least 150 minutes of moderate-intensity exercise (any activity that increases your heart rate and causes you to sweat) each week. In addition, most adults need muscle-strengthening exercises on 2 or more days a week.  Silver Sneakers may be a benefit available to you. To determine eligibility, you may visit the website: www.silversneakers.com or contact program at (319)051-1385 Mon-Fri between 8AM-8PM.   . Maintain a healthy weight. The body mass index (BMI) is a screening tool to identify possible weight problems. It provides an estimate of body fat based on height and weight. Your health care provider can find your BMI and can help you achieve or maintain a healthy weight.   For adults 20 years and older: ? A BMI below 18.5 is considered underweight. ? A BMI of 18.5 to 24.9 is normal. ? A BMI of 25 to 29.9 is considered overweight. ? A BMI of 30 and above is considered obese.   . Maintain normal  blood lipids and cholesterol levels by exercising and minimizing your intake of saturated fat. Eat a balanced diet with plenty of fruit and vegetables. Blood tests for lipids and cholesterol should begin at age 55 and be repeated every 5 years. If your lipid or cholesterol levels are high, you are over 50, or you are at high risk for heart disease, you may need your cholesterol levels checked more frequently. Ongoing high lipid and cholesterol levels should be treated with medicines if diet and exercise are not working.  . If you smoke, find out from your health care provider how to quit. If you do not use tobacco,  please do not start.  . If you choose to drink alcohol, please do not consume more than 2 drinks per day. One drink is considered to be 12 ounces (355 mL) of beer, 5 ounces (148 mL) of wine, or 1.5 ounces (44 mL) of liquor.  . If you are 51-47 years old, ask your health care provider if you should take aspirin to prevent strokes.  . Use sunscreen. Apply sunscreen liberally and repeatedly throughout the day. You should seek shade when your shadow is shorter than you. Protect yourself by wearing long sleeves, pants, a wide-brimmed hat, and sunglasses year round, whenever you are outdoors.  . Once a month, do a whole body skin exam, using a mirror to look at the skin on your back. Tell your health care provider of new moles, moles that have irregular borders, moles that are larger than a pencil eraser, or moles that have changed in shape or color.

## 2016-11-11 NOTE — Progress Notes (Signed)
Pre visit review using our clinic review tool, if applicable. No additional management support is needed unless otherwise documented below in the visit note. 

## 2016-11-11 NOTE — Progress Notes (Signed)
Mood is worse.  "All I want to do is eat and sleep."  "This isn't like me."  She isn't exercising, doesn't have motivation.  "It's all I can do to get out of bed and then go to work." Still on citalopram 40mg  a day, taking BZD rarely.  Diet is affected.  Weight is up.   Living will d/w pt. Would have her sister Jackelyn Poling designated if pt were incapacitated.   Diabetes:  Using medications without difficulties:yes Hypoglycemic episodes:no Hyperglycemic episodes:no Feet problems: no Blood Sugars averaging: not checked often, but usually ~160 recently.  Diet has been off- see above.   eye exam within last year: yes, due for f/u.   She has some blurry vision in the later afternoon.  This may be sugar influenced.   A1c controlled at 7.    H/o CVA and d/w pt about risk factor reduction.  Is still having hot flashes, have happened since her prev CVA.   She has f/u with GI pending with h/o upper abd pain after eating.  "It feels like my stomach is on fire" when she has symptoms.   PMH and SH reviewed  ROS: Per HPI unless specifically indicated in ROS section   Meds, vitals, and allergies reviewed.   GEN: nad, alert and oriented, speech wnl, judgement appears wnl HEENT: mucous membranes moist NECK: supple w/o LA CV: rrr.  no murmur PULM: ctab, no inc wob ABD: soft, +bs EXT: no edema SKIN: no acute rash  Diabetic foot exam: Normal inspection No skin breakdown No calluses  Normal DP pulses Normal sensation to light touch and monofilament Nails normal

## 2016-11-11 NOTE — Progress Notes (Signed)
Subjective:   Allison Yoder is a 66 y.o. female who presents for an Initial Medicare Annual Wellness Visit.  Review of Systems    N/A  Cardiac Risk Factors include: advanced age (>55men, >60 women);diabetes mellitus;dyslipidemia;hypertension     Objective:    Today's Vitals   11/11/16 1315  BP: 118/80  Pulse: 69  Temp: 98.6 F (37 C)  TempSrc: Oral  SpO2: 95%  Weight: 187 lb 4 oz (84.9 kg)  Height: 5' 6.5" (1.689 m)  PainSc: 0-No pain   Body mass index is 29.77 kg/m.   Current Medications (verified) Outpatient Encounter Prescriptions as of 11/11/2016  Medication Sig  . albuterol (PROVENTIL HFA;VENTOLIN HFA) 108 (90 Base) MCG/ACT inhaler Inhale 1-2 puffs into the lungs every 6 (six) hours as needed for wheezing or shortness of breath.  Marland Kitchen aspirin EC 325 MG EC tablet Take 1 tablet (325 mg total) by mouth daily.  Marland Kitchen atenolol (TENORMIN) 50 MG tablet TAKE 1 TABLET BY MOUTH DAILY  . Calcium-Vitamin D 600-200 MG-UNIT per tablet Take 1 tablet by mouth 2 (two) times daily.  . cetirizine (ZYRTEC) 10 MG tablet Take 10 mg by mouth daily as needed for allergies.   . citalopram (CELEXA) 20 MG tablet Take 1 tablet (20 mg total) by mouth 2 (two) times daily.  . clonazePAM (KLONOPIN) 0.5 MG tablet Take by mouth.  . co-enzyme Q-10 30 MG capsule Take 30 mg by mouth 3 (three) times daily.  Marland Kitchen ezetimibe-simvastatin (VYTORIN) 10-20 MG tablet Take 1 tablet by mouth daily at 6 PM.  . fluticasone (FLONASE) 50 MCG/ACT nasal spray Place 2 sprays into both nostrils daily.  . hydrocortisone (ANUSOL-HC) 25 MG suppository Place 25 mg rectally.  Marland Kitchen lisinopril (PRINIVIL,ZESTRIL) 10 MG tablet Take 1 tablet (10 mg total) by mouth at bedtime.  . metFORMIN (GLUCOPHAGE) 500 MG tablet Take 1 tablet (500 mg total) by mouth 2 (two) times daily with a meal.  . Multiple Vitamin (MULTIVITAMIN) tablet Take 1 tablet by mouth daily.  Marland Kitchen NITROFURANTOIN PO Take 100 mg by mouth daily.  . phenazopyridine (PYRIDIUM) 200 MG  tablet Take 200 mg by mouth.  . vitamin B-12 (CYANOCOBALAMIN) 1000 MCG tablet Take 1,000 mcg by mouth daily.   No facility-administered encounter medications on file as of 11/11/2016.     Allergies (verified) Cephalexin; Codeine; Inapsine [droperidol]; Lipitor [atorvastatin calcium]; Niacin and related; Pravastatin; and Adhesive [tape]   History: Past Medical History:  Diagnosis Date  . Allergy   . Anemia    "all the time as a child"  . Anxiety   . Arthritis    "all my joints; worse in my hands" (10/27/2015)  . Chronic lower back pain   . Depression   . Diverticulosis   . Fatty liver   . GERD (gastroesophageal reflux disease)   . Heart murmur    "dx'd years ago; very mild; never treated" (10/27/2015)  . Hyperlipidemia   . Hypertension   . Hypothyroidism 2002-~ 2010  . Iritis    per [facility]  . Migraines    "stopped when I went thru menopause"  . OSA on CPAP   . Pneumonia ~ 2013; 10/27/2015  . Recurrent urinary tract infection   . Snores   . Stroke ([place])   . Type II diabetes mellitus ([place])    "lost weight; started exercising again; don't have it anymore" (10/27/2015)  . Urinary incontinence   . Wears glasses    Past Surgical History:  Procedure Laterality Date  .  BLEPHAROPLASTY Bilateral 2013; 2016  . BREAST LUMPECTOMY Right 1964   benign tumor,  . HEMORRHOID BANDING  X 2  . KNEE ARTHROSCOPY Left 2016   "meniscus repair"  . PELVIC FLOOR REPAIR  2001   "lift"  . POLYPECTOMY  X 3   "bladder"  . SHOULDER ARTHROSCOPY W/ ROTATOR CUFF REPAIR Left 2013  . SHOULDER ARTHROSCOPY W/ ROTATOR CUFF REPAIR Right 2015  . TUBAL LIGATION  ~ 1986  . VAGINAL HYSTERECTOMY  1992   Partial   Family History  Problem Relation Age of Onset  . Arthritis Mother   . Heart disease Mother   . Hyperlipidemia Mother   . Hypertension Mother   . Diabetes Mother   . Depression Mother   . Stroke Mother   . Alcohol abuse Father   . Lung cancer Father   . Arthritis Maternal  Grandmother   . Stroke Maternal Grandmother   . Diabetes Maternal Grandmother   . Heart disease Paternal Uncle     x 2  . Arthritis Sister   . Breast cancer Cousin   . Heart disease Paternal Aunt   . Diabetes Paternal Uncle   . Diabetes Sister   . Heart disease Paternal Uncle     x 5  . Heart disease Paternal Aunt     x 3  . Stroke Paternal Aunt   . Dementia Paternal Grandmother   . Colon cancer Neg Hx    Social History   Occupational History  . 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  [employer]   Social History Main Topics  . Smoking status: Former Smoker    Packs/day: 2.00    Years: 5.00    Types: Cigarettes    Quit date: 09/12/1985  . Smokeless tobacco: Never Used  . Alcohol use 1.8 oz/week    2 Shots of liquor, 1 Glasses of wine per week     Comment: weekends  . Drug use: No  . Sexual activity: Yes    Tobacco Counseling Counseling given: No   Activities of Daily Living In your present state of health, do you have any difficulty performing the following activities: 11/11/2016 01/25/2016  Hearing? Y N  Vision? Y N  Difficulty concentrating or making decisions? Y N  Walking or climbing stairs? N N  Dressing or bathing? N N  Doing errands, shopping? N N  Preparing Food and eating ? N -  Using the Toilet? N -  In the past six months, have you accidently leaked urine? Y -  Do you have problems with loss of bowel control? N -  Managing your Medications? N -  Managing your Finances? N -  Housekeeping or managing your Housekeeping? N -  Some recent data might be hidden    Immunizations and Health Maintenance Immunization History  Administered Date(s) Administered  . Hepatitis B, adult 07/19/2013, 08/27/2013, 04/04/2014  . Influenza,inj,Quad PF,36+ Mos 07/25/2014  . Influenza-Unspecified 05/14/2015, 05/13/2016  . Pneumococcal Conjugate-13 02/26/2014  . Pneumococcal Polysaccharide-23 05/06/2016  . Tdap 06/25/2012  . Zoster  09/10/2012   There are no preventive care reminders to display for this patient.  Patient Care Team: Tonia Ghent, MD as PCP - General (Family Medicine)     Assessment:   This is a routine wellness examination for [employer].   Hearing/Vision screen  Hearing Screening   125Hz  250Hz  500Hz  1000Hz  2000Hz  3000Hz  4000Hz  6000Hz  8000Hz   Right ear:   40 40 0  0    Left ear:   0 0 40  40      Visual Acuity Screening   Right eye Left eye Both eyes  Without correction: 20/25-1 20/25-1 20/15  With correction:       Dietary issues and exercise activities discussed: Current Exercise Habits: Home exercise routine, Type of exercise: walking, Time (Minutes): 45, Frequency (Times/Week): 4, Weekly Exercise (Minutes/Week): 180, Intensity: Moderate, Exercise limited by: None identified  Goals    . Increase physical activity          Starting 11/11/2016, I will continue to exercise at least 45 min 4 days per week.       Depression Screen PHQ 2/9 Scores 11/11/2016 11/11/2016  PHQ - 2 Score 2 2  PHQ- 9 Score 13 13    Fall Risk Fall Risk  11/11/2016 04/08/2016  Falls in the past year? No No    Cognitive Function: MMSE - Mini Mental State Exam 11/11/2016  Orientation to time 5  Orientation to Place 5  Registration 3  Attention/ Calculation 0  Recall 3  Language- name 2 objects 0  Language- repeat 1  Language- follow 3 step command 3  Language- read & follow direction 0  Write a sentence 0  Copy design 0  Total score 20       PLEASE NOTE: A Mini-Cog screen was completed. Maximum score is 20. A value of 0 denotes this part of [place] MMSE was not completed or the patient failed this part of the Mini-Cog screening.   Mini-Cog Screening Orientation to Time - Max 5 pts Orientation to Place - Max 5 pts Registration - Max 3 pts Recall - Max 3 pts Language Repeat - Max 1 pts Language Follow 3 Step Command - Max 3 pts   Screening Tests Health Maintenance  Topic Date Due  . OPHTHALMOLOGY  EXAM  01/09/2017 (Originally 11/27/2014)  . HEMOGLOBIN A1C  05/02/2017  . FOOT EXAM  05/06/2017  . MAMMOGRAM  09/12/2018  . TETANUS/TDAP  06/25/2022  . COLONOSCOPY  04/16/2025  . INFLUENZA VACCINE  Addressed  . DEXA SCAN  Addressed  . Hepatitis C Screening  Completed  . PNA vac Low Risk Adult  Completed      Plan:  I have personally reviewed and addressed the Medicare Annual Wellness questionnaire and have noted the following in the patient's chart:  A. Medical and social history B. Use of alcohol, tobacco or illicit drugs  C. Current medications and supplements D. Functional ability and status E.  Nutritional status F.  Physical activity G. Advance directives H. List of other physicians I.  Hospitalizations, surgeries, and ER visits in previous 12 months J.  [place] to include hearing, vision, cognitive, depression L. Referrals and appointments - none  In addition, I have reviewed and discussed with patient certain preventive protocols, quality metrics, and best practice recommendations. A written personalized care plan for preventive services as well as general preventive health recommendations were provided to patient.  See attached scanned questionnaire for additional information.   Signed,   Lindell Noe, MHA, BS, LPN Health Coach

## 2016-11-11 NOTE — Progress Notes (Signed)
PCP notes:   Health maintenance:  Eye exam - addressed; pt has future appt scheduled Flu vaccine - per pt, administered in Sept 2017 Mammogram - per pt, completed in Jan 2018 Bone density - per pt, completed in Jan 2018  Abnormal screenings:   Hearing - failed Depression score: 13  Patient concerns:   Patient is concerned about recurrence of hot flashes.   Patient is concerned about weight gain.  Patient reports concerns with urinary incontinence. Please review responses to questionnaire.   Urinary Incontinence Short Questionnaire 1. How often do you leak urine? Several times a day 2. How much urine do you normally leak (whether you wear protection or not)? A small amount 3. Overall, how much does leaking urine interfere with your everyday life?  Please circle a number between 0(not at all) and 10(a great deal). Patient's response: 7 4. When does urine leak?  Leaks when I cough or sneeze Leaks when I am physically active/exercising Leaks for no obvious reason  Nurse concerns:  None  Next PCP appt:   11/11/16 @ 1400

## 2016-11-11 NOTE — Patient Instructions (Signed)
Go to the lab on the way out.  We'll contact you with your lab report. We'll go from there.  We may need to lower the dose of citalopram in the meantime.  I'll await the GI notes.

## 2016-11-13 ENCOUNTER — Other Ambulatory Visit: Payer: Self-pay | Admitting: Family Medicine

## 2016-11-13 MED ORDER — CITALOPRAM HYDROBROMIDE 20 MG PO TABS: 20.0000 mg | ORAL_TABLET | Freq: Every day | ORAL | Status: DC

## 2016-11-13 NOTE — Assessment & Plan Note (Signed)
She is due for f/u eye exam.  D/w pt.  Normal foot exam.   A1c controlled at 7, would likely be better with diet/exercise/weight loss, d/w pt.  No change in meds at this point.

## 2016-11-13 NOTE — Assessment & Plan Note (Signed)
She has has more troubles with hot flashes in general since the CVA.  At this point, the goal is still for risk factor reduction, ie weight reduction to help with lipids, A1c, etc.  She agrees.  No change in meds at this point. >25 minutes spent in face to face time with patient, >50% spent in counselling or coordination of care.

## 2016-11-13 NOTE — Progress Notes (Signed)
I reviewed health advisor's note, was available for consultation on the day of service listed in this note, and agree with documentation and plan. Graham Duncan, MD.   

## 2016-11-13 NOTE — Assessment & Plan Note (Signed)
She has more sx after eating and has f/u with GI pending.

## 2016-11-13 NOTE — Assessment & Plan Note (Signed)
We need to recheck routine labs.  If sig abnormality, then we'll address.  Unclear if she is having dec motivation from worsening depression or if from med effect from SSRI, ie if overtreated- d/w pt and she thought this could be possible.  See notes on labs.  Okay for outpatient f/u.

## 2016-11-13 NOTE — Assessment & Plan Note (Signed)
Living will d/w pt. Would have her sister Jackelyn Poling designated if pt were incapacitated.

## 2016-11-22 ENCOUNTER — Other Ambulatory Visit: Payer: Self-pay | Admitting: Family Medicine

## 2016-11-22 NOTE — Telephone Encounter (Signed)
Received refill request electronically Refill request does not match medication list See office notes 11/11/16 Please clarify directions

## 2016-11-23 NOTE — Telephone Encounter (Signed)
Corrected.  Sent.  Thanks.

## 2016-12-01 ENCOUNTER — Encounter (HOSPITAL_COMMUNITY): Payer: Self-pay | Admitting: Neurology

## 2016-12-01 ENCOUNTER — Emergency Department (HOSPITAL_COMMUNITY): Payer: Medicare Other

## 2016-12-01 ENCOUNTER — Emergency Department (HOSPITAL_COMMUNITY)
Admission: EM | Admit: 2016-12-01 | Discharge: 2016-12-01 | Disposition: A | Discharge status: Home / Self Care | Payer: Medicare Other | Attending: Emergency Medicine | Admitting: Emergency Medicine

## 2016-12-01 DIAGNOSIS — R079 Chest pain, unspecified: Secondary | ICD-10-CM | POA: Diagnosis present

## 2016-12-01 DIAGNOSIS — Z79899 Other long term (current) drug therapy: Secondary | ICD-10-CM | POA: Insufficient documentation

## 2016-12-01 DIAGNOSIS — E119 Type 2 diabetes mellitus without complications: Secondary | ICD-10-CM | POA: Diagnosis not present

## 2016-12-01 DIAGNOSIS — R0789 Other chest pain: Secondary | ICD-10-CM | POA: Diagnosis not present

## 2016-12-01 DIAGNOSIS — Z87891 Personal history of nicotine dependence: Secondary | ICD-10-CM | POA: Insufficient documentation

## 2016-12-01 DIAGNOSIS — I10 Essential (primary) hypertension: Secondary | ICD-10-CM | POA: Diagnosis not present

## 2016-12-01 DIAGNOSIS — Z7982 Long term (current) use of aspirin: Secondary | ICD-10-CM | POA: Diagnosis not present

## 2016-12-01 DIAGNOSIS — Z8673 Personal history of transient ischemic attack (TIA), and cerebral infarction without residual deficits: Secondary | ICD-10-CM | POA: Insufficient documentation

## 2016-12-01 DIAGNOSIS — Z7984 Long term (current) use of oral hypoglycemic drugs: Secondary | ICD-10-CM | POA: Insufficient documentation

## 2016-12-01 DIAGNOSIS — E039 Hypothyroidism, unspecified: Secondary | ICD-10-CM | POA: Insufficient documentation

## 2016-12-01 LAB — I-STAT TROPONIN, ED
Troponin i, poc: 0 ng/mL (ref 0.00–0.08)
Troponin i, poc: 0 ng/mL (ref 0.00–0.08)

## 2016-12-01 LAB — CBC
HCT: 41.3 % (ref 36.0–46.0)
Hemoglobin: 13.7 g/dL (ref 12.0–15.0)
MCH: 30 pg (ref 26.0–34.0)
MCHC: 33.2 g/dL (ref 30.0–36.0)
MCV: 90.6 fL (ref 78.0–100.0)
Platelets: 189 10*3/uL (ref 150–400)
RBC: 4.56 MIL/uL (ref 3.87–5.11)
RDW: 14.1 % (ref 11.5–15.5)
WBC: 8.5 10*3/uL (ref 4.0–10.5)

## 2016-12-01 LAB — BASIC METABOLIC PANEL
Anion gap: 10 (ref 5–15)
BUN: 13 mg/dL (ref 6–20)
CO2: 22 mmol/L (ref 22–32)
Calcium: 9.2 mg/dL (ref 8.9–10.3)
Chloride: 106 mmol/L (ref 101–111)
Creatinine, Ser: 0.72 mg/dL (ref 0.44–1.00)
GFR calc Af Amer: >60 mL/min (ref >60)
GFR calc non Af Amer: >60 mL/min (ref >60)
Glucose, Bld: 140 mg/dL — ABNORMAL HIGH (ref 65–99)
Potassium: 4.4 mmol/L (ref 3.5–5.1)
Sodium: 138 mmol/L (ref 135–145)

## 2016-12-01 NOTE — ED Notes (Signed)
HH meal tray ordered for patient.

## 2016-12-01 NOTE — ED Notes (Signed)
Pt given ice chips per Sarah(RN)

## 2016-12-01 NOTE — ED Provider Notes (Signed)
[facility] DEPT Provider Note   CSN: [id number] Arrival date & time: 12/01/16  1028     History   Chief Complaint Chief Complaint  Patient presents with  . Chest Pain    HPI Allison Yoder is a 66 y.o. female.  Patient is a 66 year old female with no right or cardiac history. She presents today for evaluation of chest pain. She was at work when her symptoms began. 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 She was involved in a stressful situation this morning when she developed sharp pain under her left breast. This pain radiated to her left shoulder and she felt short of breath. She denied any nausea or diaphoresis. She walked across the street to the fire department and was given aspirin and nitroglycerin. She is uncertain as to whether or not this helps, however in the meantime her symptoms have significantly improved. She denies any recent exertional symptoms. She does have a history of hypertension, cholesterol, and diabetes.   The history is provided by the patient.  Chest Pain   This is a new problem. The current episode started less than 1 hour ago. The problem occurs constantly. The problem has been rapidly improving. Associated with: Stressful situation. Pain location: under left breast. The pain is moderate. The quality of the pain is described as sharp. The pain radiates to the left shoulder. She has tried nothing for the symptoms.    Past Medical History:  Diagnosis Date  . Allergy   . Anemia    "all the time as a child"  . Anxiety   . Arthritis    "all my joints; worse in my hands" (10/27/2015)  . Chronic lower back pain   . Depression   . Diverticulosis   . Fatty liver   . GERD (gastroesophageal reflux disease)   . Heart murmur    "dx'd years ago; very mild; never treated" (10/27/2015)  . Hyperlipidemia   . Hypertension   . Hypothyroidism 2002-~ 2010  . Iritis    per [facility]  . Migraines    "stopped when I went thru menopause"  . OSA  on CPAP   . Pneumonia ~ 2013; 10/27/2015  . Recurrent urinary tract infection   . Snores   . Stroke ([place])   . Type II diabetes mellitus ([place])    "lost weight; started exercising again; don't have it anymore" (10/27/2015)  . Urinary incontinence   . Wears glasses     Patient Active Problem List   Diagnosis Date Noted  . Breast cancer screening 05/08/2016  . Estrogen deficiency 05/08/2016  . Cerebrovascular accident (CVA) due to thrombosis of left posterior cerebral artery ([place]) 04/08/2016  . Acute ischemic stroke ([place]) 01/25/2016  . Controlled diabetes mellitus ([place]) 01/25/2016  . Rash and nonspecific skin eruption 01/10/2016  . History of UTI 01/10/2016  . Abdominal pain   . Pneumonia 10/27/2015  . Depression 10/27/2015  . Essential hypertension 10/27/2015  . Advance care planning 04/29/2015  . OSA on CPAP 07/25/2014  . Routine general medical examination at a health care facility 02/26/2014  . Palpitations 01/06/2013  . Paresthesia 01/06/2013  . Abdominal pain, unspecified site 12/17/2012  . Frequent loose stools 12/17/2012  . Skin lesion 09/01/2012  . Anxiety 09/01/2012  . Shoulder pain 12/19/2011  . MDD (major depressive disorder) 12/19/2011  . OA (osteoarthritis) 12/19/2011  . Hyperlipidemia 12/19/2011  . Migraines 12/19/2011  . Hypothyroid 12/19/2011  . Iritis 12/19/2011    Past Surgical History:  Procedure Laterality Date  . BLEPHAROPLASTY Bilateral 2013; 2016  . BREAST LUMPECTOMY Right 1964   benign tumor,  . HEMORRHOID BANDING  X 2  . KNEE ARTHROSCOPY Left 2016   "meniscus repair"  . PELVIC FLOOR REPAIR  2001   "lift"  . POLYPECTOMY  X 3   "bladder"  . SHOULDER ARTHROSCOPY W/ ROTATOR CUFF REPAIR Left 2013  . SHOULDER ARTHROSCOPY W/ ROTATOR CUFF REPAIR Right 2015  . TUBAL LIGATION  ~ 1986  . VAGINAL HYSTERECTOMY  1992   Partial    OB History    No data available       Home Medications    Prior to Admission medications   Medication Sig Start  Date End Date Taking? Authorizing Provider  albuterol (PROVENTIL HFA;VENTOLIN HFA) 108 (90 Base) MCG/ACT inhaler Inhale 1-2 puffs into the lungs every 6 (six) hours as needed for wheezing or shortness of breath. 10/26/15   Tonia Ghent, MD  aspirin EC 325 MG EC tablet Take 1 tablet (325 mg total) by mouth daily. 01/28/16   Oswald Hillock, MD  atenolol (TENORMIN) 50 MG tablet TAKE 1 TABLET BY MOUTH DAILY 06/14/16   Tonia Ghent, MD  Calcium-Vitamin D 600-200 MG-UNIT per tablet Take 1 tablet by mouth 2 (two) times daily.    Historical Provider, MD  cetirizine (ZYRTEC) 10 MG tablet Take 10 mg by mouth daily as needed for allergies.     Historical Provider, MD  citalopram (CELEXA) 20 MG tablet Take 1 tablet (20 mg total) by mouth daily. 11/13/16   Tonia Ghent, MD  citalopram (CELEXA) 20 MG tablet Take 1 tablet (20 mg total) by mouth daily. 11/23/16   Tonia Ghent, MD  clonazePAM (KLONOPIN) 0.5 MG tablet Take by mouth.    Historical Provider, MD  co-enzyme Q-10 30 MG capsule Take 30 mg by mouth 3 (three) times daily.    Historical Provider, MD  ezetimibe-simvastatin (VYTORIN) 10-20 MG tablet Take 1 tablet by mouth daily at 6 PM. 11/04/16   Tonia Ghent, MD  fluticasone [facility]) 50 MCG/ACT nasal spray Place 2 sprays into both nostrils daily. 01/08/16   Tonia Ghent, MD  hydrocortisone (ANUSOL-HC) 25 MG suppository Place 25 mg rectally.    Historical Provider, MD  lisinopril (PRINIVIL,ZESTRIL) 10 MG tablet Take 1 tablet (10 mg total) by mouth at bedtime. 11/04/16   Tonia Ghent, MD  metFORMIN (GLUCOPHAGE) 500 MG tablet Take 1 tablet (500 mg total) by mouth 2 (two) times daily with a meal. 11/04/16   Tonia Ghent, MD  Multiple Vitamin (MULTIVITAMIN) tablet Take 1 tablet by mouth daily.    Historical Provider, MD  NITROFURANTOIN PO Take 100 mg by mouth daily.    Historical Provider, MD  phenazopyridine (PYRIDIUM) 200 MG tablet Take 200 mg by mouth.    Historical Provider, MD  vitamin B-12  (CYANOCOBALAMIN) 1000 MCG tablet Take 1,000 mcg by mouth daily.    Historical Provider, MD    Family History Family History  Problem Relation Age of Onset  . Arthritis Mother   . Heart disease Mother   . Hyperlipidemia Mother   . Hypertension Mother   . Diabetes Mother   . Depression Mother   . Stroke Mother   . Alcohol abuse Father   . Lung cancer Father   . Arthritis Maternal Grandmother   . Stroke Maternal Grandmother   . Diabetes Maternal Grandmother   . Heart disease Paternal Uncle     x  2  . Arthritis Sister   . Breast cancer Cousin   . Heart disease Paternal Aunt   . Diabetes Paternal Uncle   . Diabetes Sister   . Heart disease Paternal Uncle     x 5  . Heart disease Paternal Aunt     x 3  . Stroke Paternal Aunt   . Dementia Paternal Grandmother   . Colon cancer Neg Hx     Social History Social History  Substance Use Topics  . Smoking status: Former Smoker    Packs/day: 2.00    Years: 5.00    Types: Cigarettes    Quit date: 09/12/1985  . Smokeless tobacco: Never Used  . Alcohol use 1.8 oz/week    2 Shots of liquor, 1 Glasses of wine per week     Comment: weekends     Allergies   Cephalexin; Codeine; Inapsine [droperidol]; Lipitor [atorvastatin calcium]; Niacin and related; Pravastatin; and Adhesive [tape]   Review of Systems Review of Systems  Cardiovascular: Positive for chest pain.  All other systems reviewed and are negative.    Physical Exam Updated Vital Signs BP 123/74   Pulse 65   Temp 98.8 F (37.1 C) (Oral)   Resp 16   Ht 5' 6.5" (1.689 m)   Wt 185 lb (83.9 kg)   SpO2 96%   BMI 29.41 kg/m   Physical Exam  Constitutional: She is oriented to person, place, and time. She appears well-developed and well-nourished. No distress.  HENT:  Head: Normocephalic and atraumatic.  Neck: Normal range of motion. Neck supple.  Cardiovascular: Normal rate and regular rhythm.  Exam reveals no gallop and no friction rub.   No murmur  heard. Pulmonary/Chest: Effort normal and breath sounds normal. No respiratory distress. She has no wheezes.  Abdominal: Soft. Bowel sounds are normal. She exhibits no distension. There is no tenderness.  Musculoskeletal: Normal range of motion.  Neurological: She is alert and oriented to person, place, and time.  Skin: Skin is warm and dry. She is not diaphoretic.  Nursing note and vitals reviewed.    ED Treatments / Results  Labs (all labs ordered are listed, but only abnormal results are displayed) Labs Reviewed  BASIC METABOLIC PANEL - Abnormal; Notable for the following:       Result Value   Glucose, Bld 140 (*)    All other components within normal limits  CBC  I-STAT TROPOININ, ED    EKG  EKG Interpretation  Date/Time:  Thursday December 01 2016 10:35:00 EDT Ventricular Rate:  78 PR Interval:    QRS Duration: 85 QT Interval:  363 QTC Calculation: 414 R Axis:   44 Text Interpretation:  Sinus rhythm Minimal ST elevation, inferior leads Confirmed by Yeison Sippel  MD, Brien Lowe ([id number]) on 12/01/2016 11:17:37 AM       Radiology Dg Chest 2 View  Result Date: 12/01/2016 CLINICAL DATA:  Chest pain. EXAM: CHEST  2 VIEW COMPARISON:  01/07/2016 . FINDINGS: Mediastinum and hilar structures are normal. Lungs are clear. Heart size normal. No pleural effusion or pneumothorax. IMPRESSION: No acute cardiopulmonary disease . Electronically Signed   By: Marcello Moores  Register   On: 12/01/2016 11:14    Procedures Procedures (including critical care time)  Medications Ordered in ED Medications - No data to display   Initial Impression / Assessment and Plan / ED Course  I have reviewed the triage vital signs and the nursing notes.  Pertinent labs & imaging results that were available during my care of  the patient were reviewed by me and considered in my medical decision making (see chart for details).  Patient with multiple risk factors, but atypical symptoms presents with chest pain that began  under stressful conditions at work. Her EKG is unchanged and troponin 2 is negative. She is pain-free now. I believe she is appropriate for discharge, however I have advised her to follow-up with cardiology to discuss a stress test. She had a stress test proximally 12 years ago which was unremarkable and I feel as though she is due for another. She does understand to return in the meantime if her symptoms worsen or change.  Final Clinical Impressions(s) / ED Diagnoses   Final diagnoses:  None    New Prescriptions New Prescriptions   No medications on file     Veryl Speak, MD 12/01/16 1432

## 2016-12-01 NOTE — Discharge Instructions (Signed)
Follow-up with the [facility] in the next 3-4 days to discuss the possibility of a stress test.  Return to the Emergency Department if your symptoms significantly worsen or change.

## 2016-12-01 NOTE — ED Notes (Signed)
Patient transported to X-ray 

## 2016-12-01 NOTE — ED Triage Notes (Signed)
Per ems- pt was sitting at her desk with sudden onset of cp under left breast with radiation to jaw and left shoulder. 8/10 CP, 2 nitro, 324 aspirin, now 3/10 CP. Pain seems to be provoked with movement and deep palpation. EKG SR.

## 2016-12-01 NOTE — ED Notes (Addendum)
Pt given pillow.

## 2016-12-01 NOTE — ED Notes (Signed)
EKG given to Dr. Delo 

## 2016-12-02 ENCOUNTER — Telehealth: Payer: Self-pay | Admitting: Cardiovascular Disease

## 2016-12-02 NOTE — Telephone Encounter (Signed)
Pt left msg that she was seen in ED and left message that she was told to schedule stress test and to see Dr. Johnsie Cancel.  There is no order for stress test. Per ER note she is to schedule appt with cardiology to discuss stress test.

## 2016-12-05 ENCOUNTER — Encounter: Payer: Self-pay | Admitting: Cardiovascular Disease

## 2016-12-05 ENCOUNTER — Ambulatory Visit (INDEPENDENT_AMBULATORY_CARE_PROVIDER_SITE_OTHER): Payer: Medicare Other | Admitting: Cardiovascular Disease

## 2016-12-05 ENCOUNTER — Ambulatory Visit: Payer: Medicare Other | Admitting: Cardiovascular Disease

## 2016-12-05 VITALS — BP 110/76 | HR 70 | Ht 66.5 in | Wt 185.8 lb

## 2016-12-05 DIAGNOSIS — R0789 Other chest pain: Secondary | ICD-10-CM

## 2016-12-05 DIAGNOSIS — I639 Cerebral infarction, unspecified: Secondary | ICD-10-CM

## 2016-12-05 DIAGNOSIS — E782 Mixed hyperlipidemia: Secondary | ICD-10-CM

## 2016-12-05 DIAGNOSIS — Z7689 Persons encountering health services in other specified circumstances: Secondary | ICD-10-CM

## 2016-12-05 NOTE — Patient Instructions (Signed)
**Note De-Identified Nyashia Raney Obfuscation** Medication Instructions:  Same-no changes  Labwork: None  Testing/Procedures: Your physician has requested that you have an exercise tolerance test. For further information please visit [email or web address]. Please also follow instruction sheet, as given.  Dr Johnsie Cancel recommends that you have a Calcium Score  Follow-Up: Your physician wants you to follow-up in: 1 year. You will receive a reminder letter in the mail two months in advance. If you don't receive a letter, please call our office to schedule the follow-up appointment.      If you need a refill on your cardiac medications before your next appointment, please call your pharmacy.

## 2016-12-05 NOTE — Progress Notes (Signed)
Cardiology Office Note   Date:  12/05/2016   ID:  Allison Yoder, DOB 26-Feb-1951, MRN [id number]  PCP:  Elsie Stain, MD  Cardiologist:   Jenkins Rouge, MD   Chief Complaint  Patient presents with  . Establish Care      History of Present Illness: Allison Yoder is a 66 y.o. female who presents for evaluation of chest pain. Seen in ED 12/01/16  She was at work when her symptoms began. 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 She was involved in a stressful situation this morning when she developed sharp pain under her left breast. This pain radiated to her left shoulder and she felt short of breath. She denied any nausea or diaphoresis. She walked across the street to the fire department and was given aspirin and nitroglycerin. She is uncertain as to whether or not this helps, however in the meantime her symptoms have significantly improved. She denies any recent exertional symptoms. She does have a history of hypertension, cholesterol, and diabetes.  Reviewed all data from ER visit CXR NAD ECG  SR rate 78 normal  R/O   Echo from 01/26/16 normal EF 65-70% done for TIA Carotid 5/17  Plaque RICA no stenosis   CVA 01/2016 lacunar with small area of acute infarct in left amygdala and tail of hippocampus   Stress at work with coworkers that are not computer savy I take care of her brother Sonia Side  Seen by cardiology in [place] 12 years ago with normal cath   Past Medical History:  Diagnosis Date  . Allergy   . Anemia    "all the time as a child"  . Anxiety   . Arthritis    "all my joints; worse in my hands" (10/27/2015)  . Chronic lower back pain   . Depression   . Diverticulosis   . Fatty liver   . GERD (gastroesophageal reflux disease)   . Heart murmur    "dx'd years ago; very mild; never treated" (10/27/2015)  . Hyperlipidemia   . Hypertension   . Hypothyroidism 2002-~ 2010  . Iritis    per [facility]  . Migraines    "stopped when I went thru menopause"    . OSA on CPAP   . Pneumonia ~ 2013; 10/27/2015  . Recurrent urinary tract infection   . Snores   . Stroke ([place])   . Type II diabetes mellitus ([place])    "lost weight; started exercising again; don't have it anymore" (10/27/2015)  . Urinary incontinence   . Wears glasses     Past Surgical History:  Procedure Laterality Date  . BLEPHAROPLASTY Bilateral 2013; 2016  . BREAST LUMPECTOMY Right 1964   benign tumor,  . HEMORRHOID BANDING  X 2  . KNEE ARTHROSCOPY Left 2016   "meniscus repair"  . PELVIC FLOOR REPAIR  2001   "lift"  . POLYPECTOMY  X 3   "bladder"  . SHOULDER ARTHROSCOPY W/ ROTATOR CUFF REPAIR Left 2013  . SHOULDER ARTHROSCOPY W/ ROTATOR CUFF REPAIR Right 2015  . TUBAL LIGATION  ~ 1986  . VAGINAL HYSTERECTOMY  1992   Partial     Current Outpatient Prescriptions  Medication Sig Dispense Refill  . albuterol (PROVENTIL HFA;VENTOLIN HFA) 108 (90 Base) MCG/ACT inhaler Inhale 1-2 puffs into the lungs every 6 (six) hours as needed for wheezing or shortness of breath. 1 Inhaler 1  . aspirin EC 325 MG EC tablet Take 1 tablet (325 mg total) by mouth daily.  30 tablet 3  . atenolol (TENORMIN) 50 MG tablet TAKE 1 TABLET BY MOUTH DAILY 90 tablet 3  . Calcium-Vitamin D 600-200 MG-UNIT per tablet Take 1 tablet by mouth 2 (two) times daily.    . cetirizine (ZYRTEC) 10 MG tablet Take 10 mg by mouth daily as needed for allergies.     . citalopram (CELEXA) 20 MG tablet Take 30 mg by mouth daily.    . clonazePAM (KLONOPIN) 0.5 MG tablet Take 0.5 mg by mouth daily as needed for anxiety.    Marland Kitchen ezetimibe-simvastatin (VYTORIN) 10-20 MG tablet Take 1 tablet by mouth daily at 6 PM. 90 tablet 0  . fluticasone (FLONASE) 50 MCG/ACT nasal spray Place into both nostrils daily as needed for allergies or rhinitis.    . hydrocortisone (ANUSOL-HC) 25 MG suppository Place 25 mg rectally daily as needed for hemorrhoids or itching.    Marland Kitchen lisinopril (PRINIVIL,ZESTRIL) 10 MG tablet Take 1 tablet (10 mg total) by  mouth at bedtime. 90 tablet 0  . metFORMIN (GLUCOPHAGE) 500 MG tablet Take 1 tablet (500 mg total) by mouth 2 (two) times daily with a meal. 180 tablet 0  . Multiple Vitamin (MULTIVITAMIN) tablet Take 1 tablet by mouth daily.    Marland Kitchen NITROFURANTOIN PO Take 100 mg by mouth daily.    . phenazopyridine (PYRIDIUM) 200 MG tablet Take 200 mg by mouth daily as needed for pain.     No current facility-administered medications for this visit.     Allergies:   Cephalexin; Codeine; Inapsine [droperidol]; Lipitor [atorvastatin calcium]; Niacin and related; Pravastatin; and Adhesive [tape]    Social History:  The patient  reports that she quit smoking about 31 years ago. Her smoking use included Cigarettes. She has a 10.00 pack-year smoking history. She has never used smokeless tobacco. She reports that she drinks about 1.8 oz of alcohol per week . She reports that she does not use drugs.   Family History:  The patient's family history includes Alcohol abuse in her father; Arthritis in her maternal grandmother, mother, and sister; Breast cancer in her cousin; Dementia in her paternal grandmother; Depression in her mother; Diabetes in her maternal grandmother, mother, paternal uncle, and sister; Heart disease in her mother, paternal aunt, paternal aunt, paternal uncle, and paternal uncle; Hyperlipidemia in her mother; Hypertension in her mother; Lung cancer in her father; Stroke in her maternal grandmother, mother, and paternal aunt.    ROS:  Please see the history of present illness.   Otherwise, review of systems are positive for none.   All other systems are reviewed and negative.    PHYSICAL EXAM: VS:  BP 110/76   Pulse 70   Ht 5' 6.5" (1.689 m)   Wt 185 lb 12.8 oz (84.3 kg)   SpO2 97%   BMI 29.54 kg/m  , BMI Body mass index is 29.54 kg/m. Affect appropriate Healthy:  appears stated age 66: normal Neck supple with no adenopathy JVP normal no bruits no thyromegaly Lungs clear with no wheezing  and good diaphragmatic motion Heart:  S1/S2 no murmur, no rub, gallop or click PMI normal Abdomen: benighn, BS positve, no tenderness, no AAA no bruit.  No HSM or HJR Distal pulses intact with no bruits No edema Neuro non-focal Skin warm and dry No muscular weakness    EKG:  NSR normal ECG 12/01/16    Recent Labs: 11/11/2016: ALT 41; TSH 3.02 12/01/2016: BUN 13; Creatinine, Ser 0.72; Hemoglobin 13.7; Platelets 189; Potassium 4.4; Sodium 138  Lipid Panel    Component Value Date/Time   CHOL 159 05/04/2016 0825   TRIG 184.0 (H) 05/04/2016 0825   HDL 38.20 (L) 05/04/2016 0825   CHOLHDL 4 05/04/2016 0825   VLDL 36.8 05/04/2016 0825   LDLCALC 84 05/04/2016 0825      Wt Readings from Last 3 Encounters:  12/05/16 185 lb 12.8 oz (84.3 kg)  12/01/16 185 lb (83.9 kg)  11/11/16 187 lb 4 oz (84.9 kg)      Other studies Reviewed: Additional studies/ records that were reviewed today include: CXR, ECG labs and records ER visit 12/01/16.    ASSESSMENT AND PLAN:  1.  Chest Pain:  Atypical normal ECG r/o f/u Calcium score and ETT 2. Chol:  Continue zetia and statin increase dose if calcium score is high 3. HTN:  Well controlled.  Continue current medications and low sodium Dash type diet.   4. DM:  Discussed low carb diet.  Target hemoglobin A1c is 6.5 or less.  Continue current medications. 5. Depression on celexa f/u Dr Damita Dunnings  6. Stroke: lacunar on ASA carotids with no high grade lesion f/u neuro ? Add plavix    Current medicines are reviewed at length with the patient today.  The patient does not have concerns regarding medicines.  The following changes have been made:  no change  Labs/ tests ordered today include: Calcium Score and ETT  No orders of the defined types were placed in this encounter.    Disposition:   FU with me in a year      Signed, Jenkins Rouge, MD  12/05/2016 10:06 AM    [facility] [address], [place], [place]   [address] Phone: [phone]; Fax: [phone]

## 2016-12-07 ENCOUNTER — Ambulatory Visit: Payer: Medicare Other | Admitting: Cardiovascular Disease

## 2016-12-14 ENCOUNTER — Ambulatory Visit (INDEPENDENT_AMBULATORY_CARE_PROVIDER_SITE_OTHER): Payer: Medicare Other

## 2016-12-14 ENCOUNTER — Ambulatory Visit (INDEPENDENT_AMBULATORY_CARE_PROVIDER_SITE_OTHER)
Admission: RE | Admit: 2016-12-14 | Discharge: 2016-12-14 | Disposition: A | Discharge status: Home / Self Care | Payer: Self-pay | Source: Ambulatory Visit | Attending: Cardiovascular Disease | Admitting: Cardiovascular Disease

## 2016-12-14 DIAGNOSIS — E782 Mixed hyperlipidemia: Secondary | ICD-10-CM

## 2016-12-14 DIAGNOSIS — R0789 Other chest pain: Secondary | ICD-10-CM

## 2016-12-14 LAB — EXERCISE TOLERANCE TEST
Estimated workload: 8.5 METS
Exercise duration (min): 7 min
Exercise duration (sec): 0 s
MPHR: 154 {beats}/min
Peak HR: 146 {beats}/min
Percent HR: 94 %
Percent of predicted max HR: 94 %
RPE: 19
Rest HR: 76 {beats}/min
Stage 1 DBP: 83 mmHg
Stage 1 Grade: 0 %
Stage 1 HR: 75 {beats}/min
Stage 1 SBP: 148 mmHg
Stage 1 Speed: 0 mph
Stage 2 Grade: 0 %
Stage 2 HR: 77 {beats}/min
Stage 2 Speed: 1 mph
Stage 3 Grade: 0 %
Stage 3 HR: 78 {beats}/min
Stage 3 Speed: 1 mph
Stage 4 DBP: 75 mmHg
Stage 4 Grade: 10 %
Stage 4 HR: 110 {beats}/min
Stage 4 SBP: 176 mmHg
Stage 4 Speed: 1.7 mph
Stage 5 DBP: 77 mmHg
Stage 5 Grade: 12 %
Stage 5 HR: 136 {beats}/min
Stage 5 SBP: 218 mmHg
Stage 5 Speed: 2.5 mph
Stage 6 Grade: 14 %
Stage 6 HR: 146 {beats}/min
Stage 6 Speed: 3.4 mph
Stage 7 DBP: 79 mmHg
Stage 7 Grade: 0 %
Stage 7 HR: 131 {beats}/min
Stage 7 SBP: 231 mmHg
Stage 7 Speed: 1.5 mph
Stage 8 DBP: 81 mmHg
Stage 8 Grade: 0 %
Stage 8 HR: 87 {beats}/min
Stage 8 SBP: 156 mmHg
Stage 8 Speed: 0 mph

## 2016-12-15 DIAGNOSIS — H16223 Keratoconjunctivitis sicca, not specified as Sjogren's, bilateral: Secondary | ICD-10-CM | POA: Diagnosis not present

## 2016-12-16 ENCOUNTER — Telehealth: Payer: Self-pay

## 2016-12-16 DIAGNOSIS — Z79899 Other long term (current) drug therapy: Secondary | ICD-10-CM

## 2016-12-16 MED ORDER — EZETIMIBE-SIMVASTATIN 10-40 MG PO TABS: 1.0000 | ORAL_TABLET | Freq: Every day | ORAL | 3 refills | Status: DC

## 2016-12-16 NOTE — Telephone Encounter (Addendum)
Patient aware of her results. Patient is concerned about her results and would like a call from Dr. Johnsie Cancel to explain more in depth about her calcium score. Will send patient's medication to pharmacy of choice. Patient would like to have a call back on her work phone if call is returned on Monday at [phone]

## 2016-12-16 NOTE — Telephone Encounter (Signed)
-----   Message from Josue Hector, MD sent at 12/16/2016  4:29 PM EDT ----- Calcium score high 86th percentile increase vytorin to 10/40 f/u lipids and liver in 3 months

## 2016-12-19 NOTE — Telephone Encounter (Signed)
I called f/u with me in a year

## 2016-12-28 DIAGNOSIS — K5732 Diverticulitis of large intestine without perforation or abscess without bleeding: Secondary | ICD-10-CM | POA: Diagnosis not present

## 2016-12-28 DIAGNOSIS — R109 Unspecified abdominal pain: Secondary | ICD-10-CM | POA: Diagnosis not present

## 2016-12-29 ENCOUNTER — Telehealth: Payer: Self-pay

## 2016-12-29 MED ORDER — GLUCOSE BLOOD VI STRP: ORAL_STRIP | 1 refills | Status: DC

## 2016-12-29 MED ORDER — ONETOUCH DELICA LANCETS 33G MISC: 1 refills | Status: DC

## 2016-12-29 MED ORDER — ONETOUCH ULTRA 2 W/DEVICE KIT: PACK | 0 refills | Status: DC

## 2016-12-29 NOTE — Telephone Encounter (Signed)
Pt request one touch ultra 2 meter,strips and lancets to [facility]. Pt test once daily. Seen 11/11/16; advised pt done.

## 2016-12-30 IMAGING — CT CT ABD-PELV W/ CM
2 of 5 series · 10 of 46 positions shown, 11 images · IV contrast (Iodine)
Comparison: 02/10/2012 abdominal sonogram. No prior CT abdomen/
pelvis. 10/29/2015 chest CT.

CLINICAL DATA: Diffuse abdominal pain.

EXAM:
CT ABDOMEN AND PELVIS WITH CONTRAST
TECHNIQUE: Multidetector CT imaging of the abdomen and pelvis was performed
using the standard protocol following bolus administration of
intravenous contrast.
CONTRAST:  100mL OMNIPAQUE IOHEXOL 300 MG/ML  SOLN

[Series 201: routine, idose (2) · axial · 0.77mm/px · z∈[-508,-133]mm · 7 of 97 slices shown, 8 images]
[im 11/97  soft-tissue]
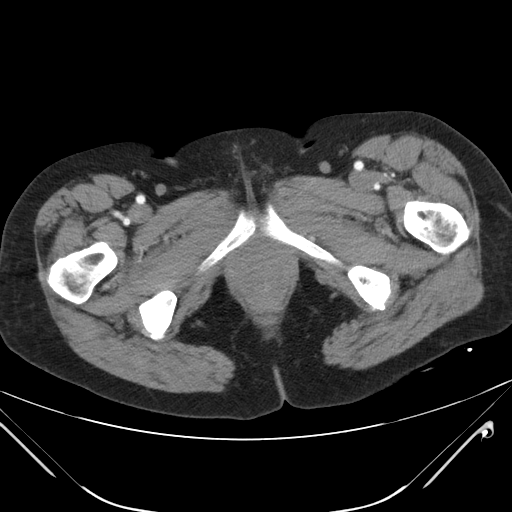
[im 11/97  bone]
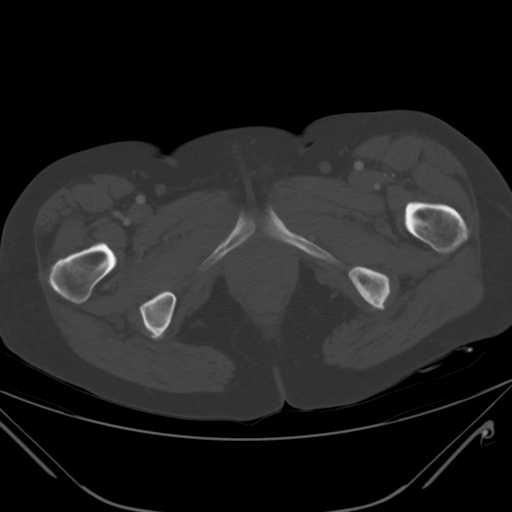
[im 22/97  soft-tissue]
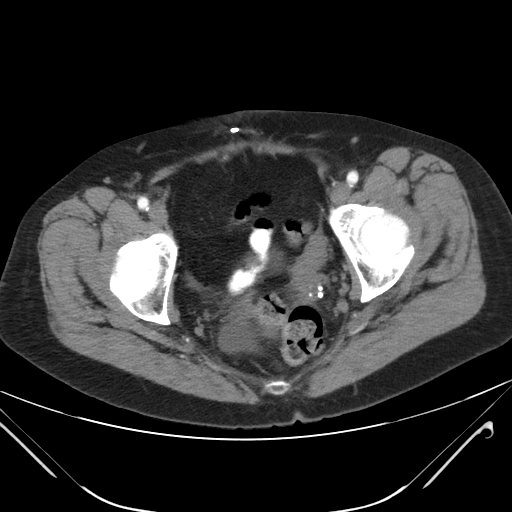
[im 38/97  soft-tissue]
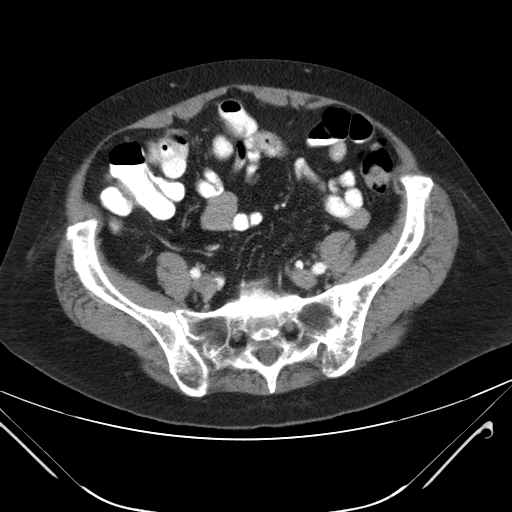
[im 49/97  soft-tissue]
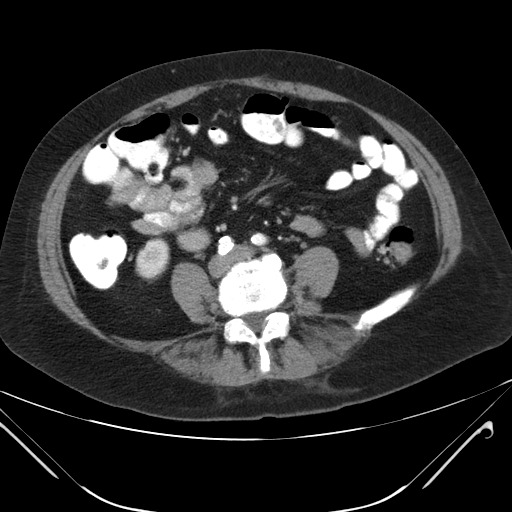
[im 59/97  soft-tissue]
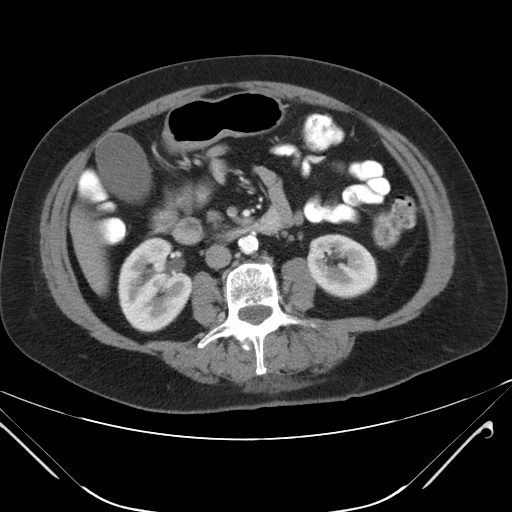
[im 75/97  soft-tissue]
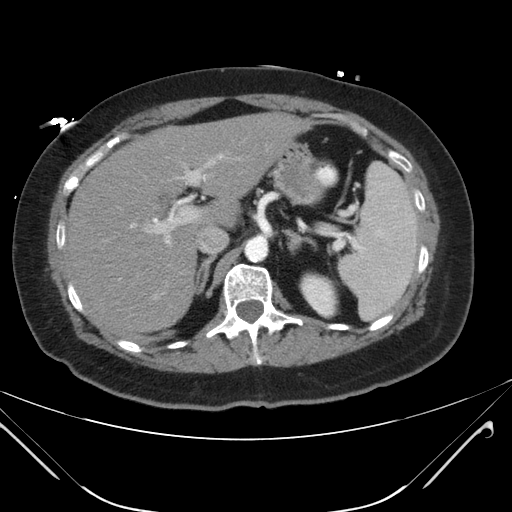
[im 86/97  soft-tissue]
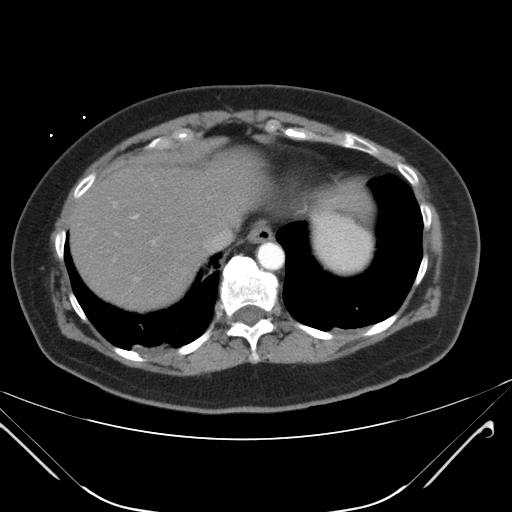

[Series 203: coronals, idose (2) · coronal · 0.45mm/px · 3 of 148 slices shown]
[im 50/148  soft-tissue]
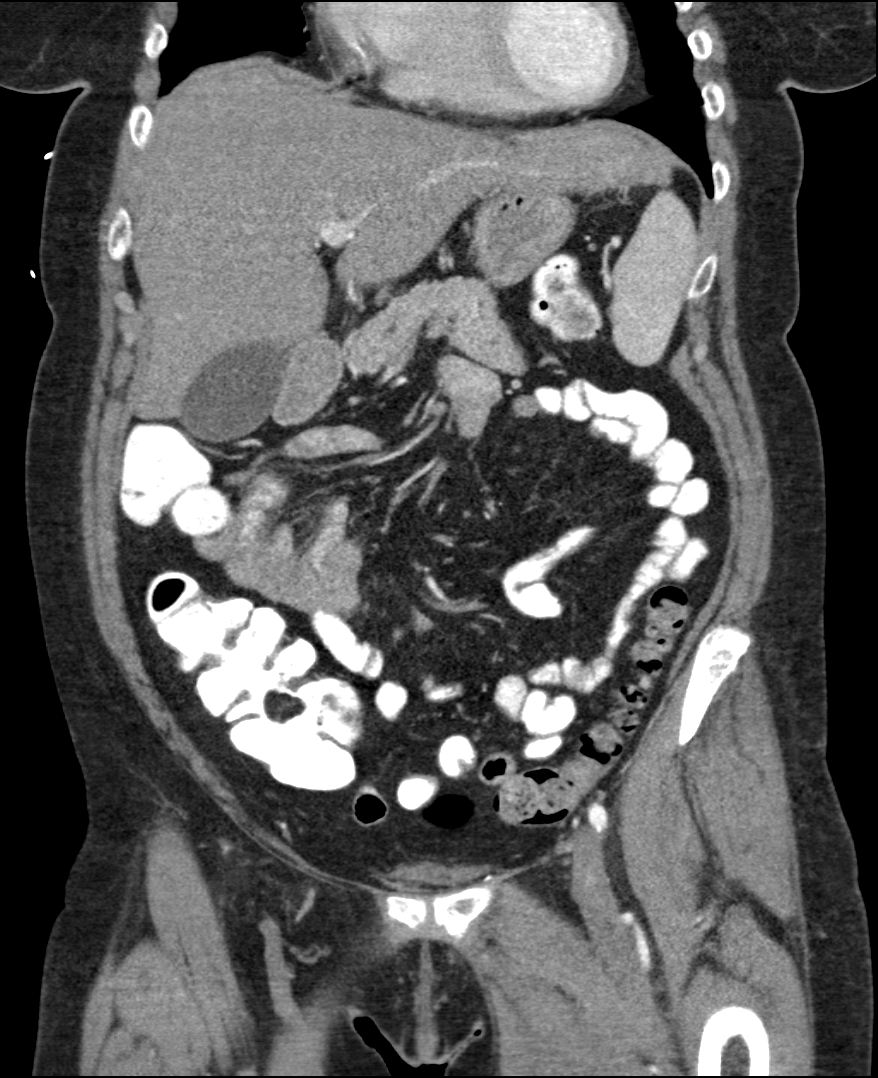
[im 66/148  soft-tissue]
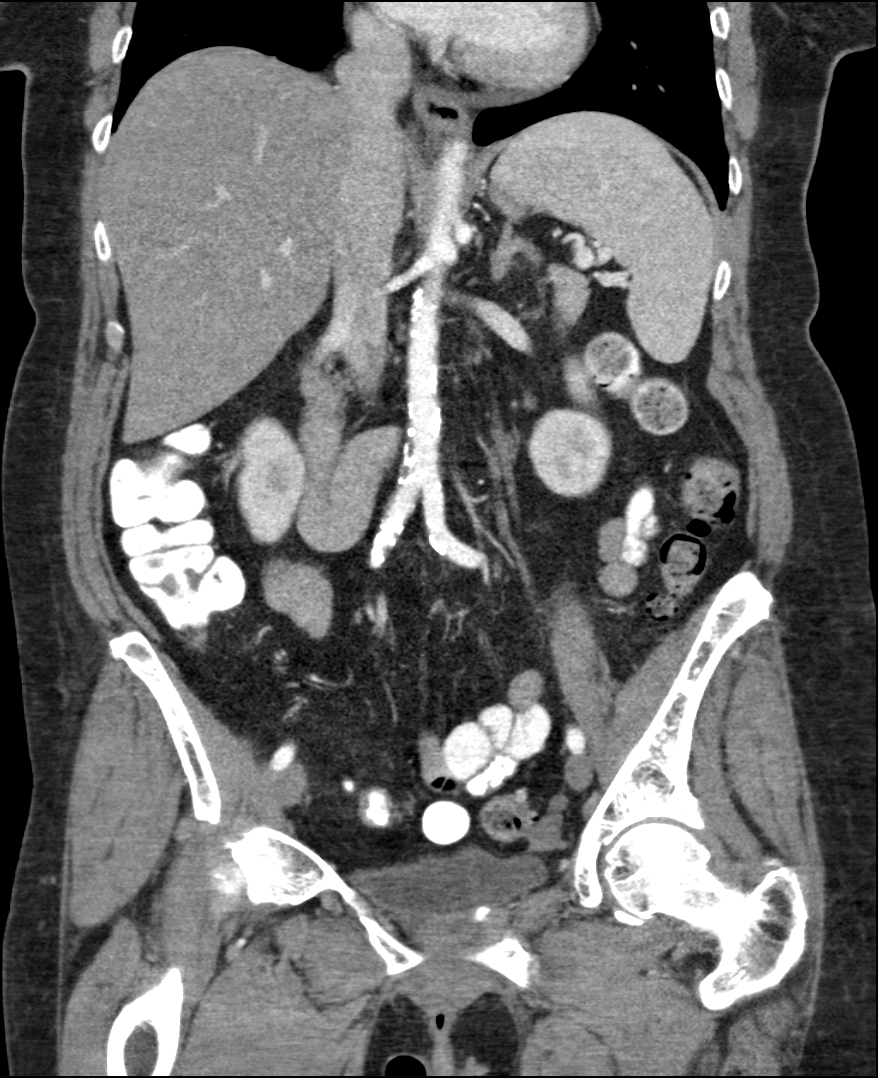
[im 82/148  soft-tissue]
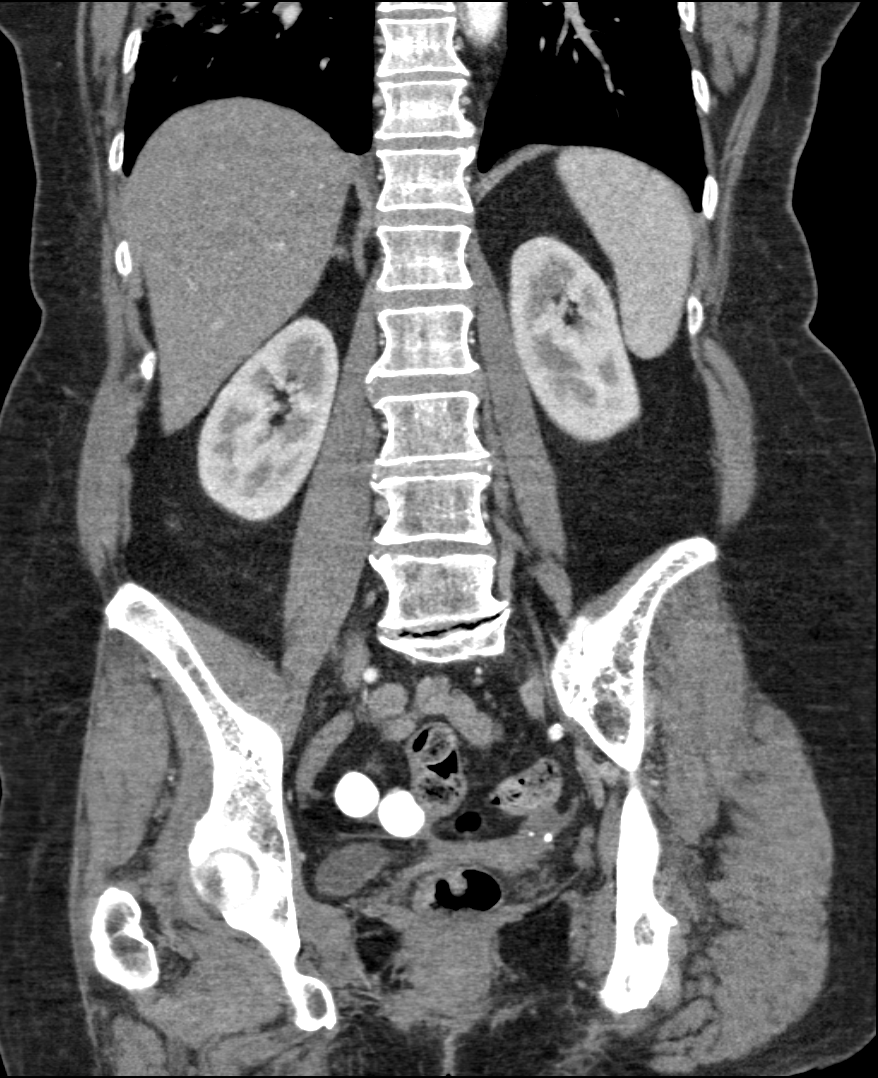

[10 of 46 positions shown; findings below may reference images not displayed]

FINDINGS: Lower chest: There is extensive patchy consolidation and
ground-glass opacity in the visualized dependent right lower lobe,
not appreciably changed since the chest CT study from 2 days prior.
There is mild patchy consolidation and ground-glass opacity in the
basilar left lower lobe, unchanged. New trace bilateral pleural
effusions, right greater than left .

Hepatobiliary: Normal liver with no liver mass. Normal gallbladder
with no radiopaque cholelithiasis. No intrahepatic biliary ductal
dilatation. Common bile duct diameter 6 mm, upper normal for age. No
radiopaque choledocholithiasis.

Pancreas: Normal, with no mass or duct dilation.

Spleen: Normal size. No mass.

Adrenals/Urinary Tract: Normal adrenals. There are subcentimeter
hypodense renal cortical lesions in both kidneys, too small to
characterize. Otherwise normal kidneys, with no hydronephrosis.
Normal bladder.

Stomach/Bowel: Grossly normal stomach. Normal caliber small bowel
with no small bowel wall thickening. Normal appendix. Mild to
moderate sigmoid diverticulosis, with no large bowel wall thickening
or pericolonic fat stranding.

Vascular/Lymphatic: Atherosclerotic nonaneurysmal abdominal aorta.
Patent portal, splenic, hepatic and renal veins. No pathologically
enlarged lymph nodes in the abdomen or pelvis.

Reproductive: Status post hysterectomy, with no abnormal findings at
the vaginal cuff. No adnexal mass.

Other: Nonspecific small volume simple free fluid in the pelvic
cul-de-sac. No abdominal ascites. No focal fluid collection. No
pneumoperitoneum.

Musculoskeletal: There is bony expansion and trabecular thickening
throughout the left hemipelvis suggestive of Paget's disease.
Moderate degenerative changes in the visualized thoracolumbar spine.
IMPRESSION: 1. Patchy consolidation and ground-glass opacity at the right
greater than left lung bases, unchanged from the chest CT study
performed 2 days prior, most suggestive of a multifocal pneumonia.
New trace bilateral pleural effusions, right greater than left.
Recommend follow-up PA and lateral post treatment chest radiographs
in 4-6 weeks.
2. Nonspecific small volume simple free fluid in the pelvic
cul-de-sac.
3. Otherwise no acute abnormality in the abdomen and pelvis. Mild to
moderate sigmoid diverticulosis. No evidence of bowel obstruction or
acute bowel inflammation. Normal appendix.
4. Probable Paget's disease in the left hemipelvis.

## 2017-01-05 ENCOUNTER — Other Ambulatory Visit: Payer: Self-pay | Admitting: Family Medicine

## 2017-01-05 ENCOUNTER — Other Ambulatory Visit: Payer: Self-pay

## 2017-01-05 MED ORDER — ATENOLOL 50 MG PO TABS
50.0000 mg | ORAL_TABLET | Freq: Every day | ORAL | 0 refills | Status: DC
Start: 2017-01-05 — End: 2017-01-05

## 2017-01-05 MED ORDER — ATENOLOL 50 MG PO TABS: 50.0000 mg | ORAL_TABLET | Freq: Every day | ORAL | 1 refills | Status: DC

## 2017-01-05 NOTE — Telephone Encounter (Signed)
Pt is in [place] and request 90 day atenolol sent to [facility] and 30 day sent to [facility] [place]. Advised pt done.

## 2017-01-26 DIAGNOSIS — M9900 Segmental and somatic dysfunction of head region: Secondary | ICD-10-CM | POA: Diagnosis not present

## 2017-01-26 DIAGNOSIS — M5030 Other cervical disc degeneration, unspecified cervical region: Secondary | ICD-10-CM | POA: Diagnosis not present

## 2017-01-26 DIAGNOSIS — M542 Cervicalgia: Secondary | ICD-10-CM | POA: Diagnosis not present

## 2017-01-26 DIAGNOSIS — M546 Pain in thoracic spine: Secondary | ICD-10-CM | POA: Diagnosis not present

## 2017-01-26 DIAGNOSIS — M9902 Segmental and somatic dysfunction of thoracic region: Secondary | ICD-10-CM | POA: Diagnosis not present

## 2017-01-26 DIAGNOSIS — M9901 Segmental and somatic dysfunction of cervical region: Secondary | ICD-10-CM | POA: Diagnosis not present

## 2017-01-26 DIAGNOSIS — R51 Headache: Secondary | ICD-10-CM | POA: Diagnosis not present

## 2017-01-26 DIAGNOSIS — M62830 Muscle spasm of back: Secondary | ICD-10-CM | POA: Diagnosis not present

## 2017-01-30 DIAGNOSIS — M5030 Other cervical disc degeneration, unspecified cervical region: Secondary | ICD-10-CM | POA: Diagnosis not present

## 2017-01-30 DIAGNOSIS — R51 Headache: Secondary | ICD-10-CM | POA: Diagnosis not present

## 2017-01-30 DIAGNOSIS — M542 Cervicalgia: Secondary | ICD-10-CM | POA: Diagnosis not present

## 2017-01-30 DIAGNOSIS — M9902 Segmental and somatic dysfunction of thoracic region: Secondary | ICD-10-CM | POA: Diagnosis not present

## 2017-01-30 DIAGNOSIS — M62830 Muscle spasm of back: Secondary | ICD-10-CM | POA: Diagnosis not present

## 2017-01-30 DIAGNOSIS — M546 Pain in thoracic spine: Secondary | ICD-10-CM | POA: Diagnosis not present

## 2017-01-30 DIAGNOSIS — M9900 Segmental and somatic dysfunction of head region: Secondary | ICD-10-CM | POA: Diagnosis not present

## 2017-01-30 DIAGNOSIS — M9901 Segmental and somatic dysfunction of cervical region: Secondary | ICD-10-CM | POA: Diagnosis not present

## 2017-02-01 DIAGNOSIS — M542 Cervicalgia: Secondary | ICD-10-CM | POA: Diagnosis not present

## 2017-02-01 DIAGNOSIS — M9901 Segmental and somatic dysfunction of cervical region: Secondary | ICD-10-CM | POA: Diagnosis not present

## 2017-02-01 DIAGNOSIS — R51 Headache: Secondary | ICD-10-CM | POA: Diagnosis not present

## 2017-02-01 DIAGNOSIS — M62830 Muscle spasm of back: Secondary | ICD-10-CM | POA: Diagnosis not present

## 2017-02-01 DIAGNOSIS — M9900 Segmental and somatic dysfunction of head region: Secondary | ICD-10-CM | POA: Diagnosis not present

## 2017-02-01 DIAGNOSIS — M546 Pain in thoracic spine: Secondary | ICD-10-CM | POA: Diagnosis not present

## 2017-02-01 DIAGNOSIS — M5030 Other cervical disc degeneration, unspecified cervical region: Secondary | ICD-10-CM | POA: Diagnosis not present

## 2017-02-01 DIAGNOSIS — M9902 Segmental and somatic dysfunction of thoracic region: Secondary | ICD-10-CM | POA: Diagnosis not present

## 2017-02-07 DIAGNOSIS — M62830 Muscle spasm of back: Secondary | ICD-10-CM | POA: Diagnosis not present

## 2017-02-07 DIAGNOSIS — M9901 Segmental and somatic dysfunction of cervical region: Secondary | ICD-10-CM | POA: Diagnosis not present

## 2017-02-07 DIAGNOSIS — R51 Headache: Secondary | ICD-10-CM | POA: Diagnosis not present

## 2017-02-07 DIAGNOSIS — M9900 Segmental and somatic dysfunction of head region: Secondary | ICD-10-CM | POA: Diagnosis not present

## 2017-02-07 DIAGNOSIS — M5030 Other cervical disc degeneration, unspecified cervical region: Secondary | ICD-10-CM | POA: Diagnosis not present

## 2017-02-07 DIAGNOSIS — M9902 Segmental and somatic dysfunction of thoracic region: Secondary | ICD-10-CM | POA: Diagnosis not present

## 2017-02-07 DIAGNOSIS — M542 Cervicalgia: Secondary | ICD-10-CM | POA: Diagnosis not present

## 2017-02-07 DIAGNOSIS — M546 Pain in thoracic spine: Secondary | ICD-10-CM | POA: Diagnosis not present

## 2017-02-09 DIAGNOSIS — M9902 Segmental and somatic dysfunction of thoracic region: Secondary | ICD-10-CM | POA: Diagnosis not present

## 2017-02-09 DIAGNOSIS — M62830 Muscle spasm of back: Secondary | ICD-10-CM | POA: Diagnosis not present

## 2017-02-09 DIAGNOSIS — M542 Cervicalgia: Secondary | ICD-10-CM | POA: Diagnosis not present

## 2017-02-09 DIAGNOSIS — M9900 Segmental and somatic dysfunction of head region: Secondary | ICD-10-CM | POA: Diagnosis not present

## 2017-02-09 DIAGNOSIS — R51 Headache: Secondary | ICD-10-CM | POA: Diagnosis not present

## 2017-02-09 DIAGNOSIS — M9901 Segmental and somatic dysfunction of cervical region: Secondary | ICD-10-CM | POA: Diagnosis not present

## 2017-02-09 DIAGNOSIS — M5030 Other cervical disc degeneration, unspecified cervical region: Secondary | ICD-10-CM | POA: Diagnosis not present

## 2017-02-09 DIAGNOSIS — M546 Pain in thoracic spine: Secondary | ICD-10-CM | POA: Diagnosis not present

## 2017-02-15 DIAGNOSIS — M9900 Segmental and somatic dysfunction of head region: Secondary | ICD-10-CM | POA: Diagnosis not present

## 2017-02-15 DIAGNOSIS — M5030 Other cervical disc degeneration, unspecified cervical region: Secondary | ICD-10-CM | POA: Diagnosis not present

## 2017-02-15 DIAGNOSIS — M9901 Segmental and somatic dysfunction of cervical region: Secondary | ICD-10-CM | POA: Diagnosis not present

## 2017-02-15 DIAGNOSIS — M9902 Segmental and somatic dysfunction of thoracic region: Secondary | ICD-10-CM | POA: Diagnosis not present

## 2017-02-15 DIAGNOSIS — R51 Headache: Secondary | ICD-10-CM | POA: Diagnosis not present

## 2017-02-15 DIAGNOSIS — M62830 Muscle spasm of back: Secondary | ICD-10-CM | POA: Diagnosis not present

## 2017-02-15 DIAGNOSIS — M546 Pain in thoracic spine: Secondary | ICD-10-CM | POA: Diagnosis not present

## 2017-02-15 DIAGNOSIS — M542 Cervicalgia: Secondary | ICD-10-CM | POA: Diagnosis not present

## 2017-02-16 DIAGNOSIS — M542 Cervicalgia: Secondary | ICD-10-CM | POA: Diagnosis not present

## 2017-02-16 DIAGNOSIS — M9900 Segmental and somatic dysfunction of head region: Secondary | ICD-10-CM | POA: Diagnosis not present

## 2017-02-16 DIAGNOSIS — M546 Pain in thoracic spine: Secondary | ICD-10-CM | POA: Diagnosis not present

## 2017-02-16 DIAGNOSIS — M5030 Other cervical disc degeneration, unspecified cervical region: Secondary | ICD-10-CM | POA: Diagnosis not present

## 2017-02-16 DIAGNOSIS — R51 Headache: Secondary | ICD-10-CM | POA: Diagnosis not present

## 2017-02-16 DIAGNOSIS — M9902 Segmental and somatic dysfunction of thoracic region: Secondary | ICD-10-CM | POA: Diagnosis not present

## 2017-02-16 DIAGNOSIS — M9901 Segmental and somatic dysfunction of cervical region: Secondary | ICD-10-CM | POA: Diagnosis not present

## 2017-02-16 DIAGNOSIS — M62830 Muscle spasm of back: Secondary | ICD-10-CM | POA: Diagnosis not present

## 2017-02-20 DIAGNOSIS — M542 Cervicalgia: Secondary | ICD-10-CM | POA: Diagnosis not present

## 2017-02-20 DIAGNOSIS — M9900 Segmental and somatic dysfunction of head region: Secondary | ICD-10-CM | POA: Diagnosis not present

## 2017-02-20 DIAGNOSIS — M5030 Other cervical disc degeneration, unspecified cervical region: Secondary | ICD-10-CM | POA: Diagnosis not present

## 2017-02-20 DIAGNOSIS — M9902 Segmental and somatic dysfunction of thoracic region: Secondary | ICD-10-CM | POA: Diagnosis not present

## 2017-02-20 DIAGNOSIS — M546 Pain in thoracic spine: Secondary | ICD-10-CM | POA: Diagnosis not present

## 2017-02-20 DIAGNOSIS — R51 Headache: Secondary | ICD-10-CM | POA: Diagnosis not present

## 2017-02-20 DIAGNOSIS — M62830 Muscle spasm of back: Secondary | ICD-10-CM | POA: Diagnosis not present

## 2017-02-20 DIAGNOSIS — M9901 Segmental and somatic dysfunction of cervical region: Secondary | ICD-10-CM | POA: Diagnosis not present

## 2017-02-23 DIAGNOSIS — M5030 Other cervical disc degeneration, unspecified cervical region: Secondary | ICD-10-CM | POA: Diagnosis not present

## 2017-02-23 DIAGNOSIS — M9901 Segmental and somatic dysfunction of cervical region: Secondary | ICD-10-CM | POA: Diagnosis not present

## 2017-02-23 DIAGNOSIS — M542 Cervicalgia: Secondary | ICD-10-CM | POA: Diagnosis not present

## 2017-02-23 DIAGNOSIS — R51 Headache: Secondary | ICD-10-CM | POA: Diagnosis not present

## 2017-02-23 DIAGNOSIS — M62830 Muscle spasm of back: Secondary | ICD-10-CM | POA: Diagnosis not present

## 2017-02-23 DIAGNOSIS — M9900 Segmental and somatic dysfunction of head region: Secondary | ICD-10-CM | POA: Diagnosis not present

## 2017-02-23 DIAGNOSIS — M546 Pain in thoracic spine: Secondary | ICD-10-CM | POA: Diagnosis not present

## 2017-02-23 DIAGNOSIS — M9902 Segmental and somatic dysfunction of thoracic region: Secondary | ICD-10-CM | POA: Diagnosis not present

## 2017-02-27 DIAGNOSIS — M9901 Segmental and somatic dysfunction of cervical region: Secondary | ICD-10-CM | POA: Diagnosis not present

## 2017-02-27 DIAGNOSIS — M546 Pain in thoracic spine: Secondary | ICD-10-CM | POA: Diagnosis not present

## 2017-02-27 DIAGNOSIS — M9900 Segmental and somatic dysfunction of head region: Secondary | ICD-10-CM | POA: Diagnosis not present

## 2017-02-27 DIAGNOSIS — M9902 Segmental and somatic dysfunction of thoracic region: Secondary | ICD-10-CM | POA: Diagnosis not present

## 2017-02-27 DIAGNOSIS — M62830 Muscle spasm of back: Secondary | ICD-10-CM | POA: Diagnosis not present

## 2017-02-27 DIAGNOSIS — M5030 Other cervical disc degeneration, unspecified cervical region: Secondary | ICD-10-CM | POA: Diagnosis not present

## 2017-02-27 DIAGNOSIS — R51 Headache: Secondary | ICD-10-CM | POA: Diagnosis not present

## 2017-02-27 DIAGNOSIS — M542 Cervicalgia: Secondary | ICD-10-CM | POA: Diagnosis not present

## 2017-02-28 ENCOUNTER — Encounter: Payer: Self-pay | Admitting: Neurology

## 2017-03-06 DIAGNOSIS — M5030 Other cervical disc degeneration, unspecified cervical region: Secondary | ICD-10-CM | POA: Diagnosis not present

## 2017-03-06 DIAGNOSIS — M9900 Segmental and somatic dysfunction of head region: Secondary | ICD-10-CM | POA: Diagnosis not present

## 2017-03-06 DIAGNOSIS — R51 Headache: Secondary | ICD-10-CM | POA: Diagnosis not present

## 2017-03-06 DIAGNOSIS — M9901 Segmental and somatic dysfunction of cervical region: Secondary | ICD-10-CM | POA: Diagnosis not present

## 2017-03-06 DIAGNOSIS — M9902 Segmental and somatic dysfunction of thoracic region: Secondary | ICD-10-CM | POA: Diagnosis not present

## 2017-03-06 DIAGNOSIS — M62830 Muscle spasm of back: Secondary | ICD-10-CM | POA: Diagnosis not present

## 2017-03-06 DIAGNOSIS — M542 Cervicalgia: Secondary | ICD-10-CM | POA: Diagnosis not present

## 2017-03-06 DIAGNOSIS — M546 Pain in thoracic spine: Secondary | ICD-10-CM | POA: Diagnosis not present

## 2017-03-08 ENCOUNTER — Ambulatory Visit: Payer: Medicare Other | Admitting: Neurology

## 2017-03-10 DIAGNOSIS — R350 Frequency of micturition: Secondary | ICD-10-CM | POA: Diagnosis not present

## 2017-03-10 DIAGNOSIS — N3020 Other chronic cystitis without hematuria: Secondary | ICD-10-CM | POA: Diagnosis not present

## 2017-03-13 DIAGNOSIS — M62830 Muscle spasm of back: Secondary | ICD-10-CM | POA: Diagnosis not present

## 2017-03-13 DIAGNOSIS — M9901 Segmental and somatic dysfunction of cervical region: Secondary | ICD-10-CM | POA: Diagnosis not present

## 2017-03-13 DIAGNOSIS — M9902 Segmental and somatic dysfunction of thoracic region: Secondary | ICD-10-CM | POA: Diagnosis not present

## 2017-03-13 DIAGNOSIS — M546 Pain in thoracic spine: Secondary | ICD-10-CM | POA: Diagnosis not present

## 2017-03-13 DIAGNOSIS — M5030 Other cervical disc degeneration, unspecified cervical region: Secondary | ICD-10-CM | POA: Diagnosis not present

## 2017-03-13 DIAGNOSIS — M9900 Segmental and somatic dysfunction of head region: Secondary | ICD-10-CM | POA: Diagnosis not present

## 2017-03-13 DIAGNOSIS — M542 Cervicalgia: Secondary | ICD-10-CM | POA: Diagnosis not present

## 2017-03-13 DIAGNOSIS — R51 Headache: Secondary | ICD-10-CM | POA: Diagnosis not present

## 2017-03-22 ENCOUNTER — Ambulatory Visit (INDEPENDENT_AMBULATORY_CARE_PROVIDER_SITE_OTHER): Payer: Medicare Other | Admitting: Internal Medicine

## 2017-03-22 ENCOUNTER — Ambulatory Visit (INDEPENDENT_AMBULATORY_CARE_PROVIDER_SITE_OTHER): Payer: Medicare Other | Admitting: Family Medicine

## 2017-03-22 ENCOUNTER — Encounter: Payer: Self-pay | Admitting: Internal Medicine

## 2017-03-22 ENCOUNTER — Encounter: Payer: Self-pay | Admitting: Family Medicine

## 2017-03-22 ENCOUNTER — Other Ambulatory Visit: Payer: Medicare Other

## 2017-03-22 ENCOUNTER — Ambulatory Visit: Payer: Medicare Other | Admitting: Family Medicine

## 2017-03-22 VITALS — BP 118/74 | HR 88 | Temp 98.5°F | Wt 181.0 lb

## 2017-03-22 VITALS — BP 118/72 | HR 79 | Ht 66.5 in | Wt 178.0 lb

## 2017-03-22 DIAGNOSIS — I639 Cerebral infarction, unspecified: Secondary | ICD-10-CM | POA: Diagnosis not present

## 2017-03-22 DIAGNOSIS — R109 Unspecified abdominal pain: Secondary | ICD-10-CM

## 2017-03-22 DIAGNOSIS — R197 Diarrhea, unspecified: Secondary | ICD-10-CM | POA: Diagnosis not present

## 2017-03-22 DIAGNOSIS — I10 Essential (primary) hypertension: Secondary | ICD-10-CM

## 2017-03-22 DIAGNOSIS — R1030 Lower abdominal pain, unspecified: Secondary | ICD-10-CM | POA: Diagnosis not present

## 2017-03-22 DIAGNOSIS — E1159 Type 2 diabetes mellitus with other circulatory complications: Secondary | ICD-10-CM

## 2017-03-22 LAB — POC URINALSYSI DIPSTICK (AUTOMATED)
Bilirubin, UA: NEGATIVE
Glucose, UA: NEGATIVE
Ketones, UA: NEGATIVE
Nitrite, UA: POSITIVE
Protein, UA: NEGATIVE
Spec Grav, UA: 1.025 (ref 1.010–1.025)
Urobilinogen, UA: 0.2 E.U./dL
pH, UA: 6 (ref 5.0–8.0)

## 2017-03-22 NOTE — Patient Instructions (Addendum)
Go to the lab on the way out.  We'll contact you with your lab report. Rest and fluids in the meantime.  Hold the antibiotics for now.  Update me tomorrow or the next day.  Avoid dairy.  Take care.  Glad to see you.

## 2017-03-22 NOTE — Progress Notes (Signed)
Sx started to get worse yesterday, may have been less severe and not noted as much the day or so prior to that.  Felt achy yesterday, all over, joints, muscles.  Then occ "stabbing" pains in the extremities.  Diarrhea in the meantime.  No burning with urination.  Presumed sick contacts at work.  No vomiting.  Some HA.  No mucous or blood in stools.   Meds, vitals, and allergies reviewed.   ROS: Per HPI unless specifically indicated in ROS section   GEN: nad, alert and oriented, but she looks like she doesn't feel well. HEENT: mucous membranes moist NECK: supple w/o LA CV: rrr. PULM: ctab, no inc wob ABD: soft, +bs, not ttp, no rebound EXT: no edema SKIN: no acute rash

## 2017-03-23 DIAGNOSIS — R197 Diarrhea, unspecified: Secondary | ICD-10-CM | POA: Diagnosis not present

## 2017-03-23 DIAGNOSIS — R109 Unspecified abdominal pain: Secondary | ICD-10-CM | POA: Diagnosis not present

## 2017-03-23 LAB — COMPREHENSIVE METABOLIC PANEL
ALT: 28 U/L (ref 0–35)
AST: 24 U/L (ref 0–37)
Albumin: 4.2 g/dL (ref 3.5–5.2)
Alkaline Phosphatase: 81 U/L (ref 39–117)
BUN: 13 mg/dL (ref 6–23)
CO2: 30 mEq/L (ref 19–32)
Calcium: 9.7 mg/dL (ref 8.4–10.5)
Chloride: 101 mEq/L (ref 96–112)
Creatinine, Ser: 0.84 mg/dL (ref 0.40–1.20)
GFR: 71.95 mL/min (ref >60.00)
Glucose, Bld: 149 mg/dL — ABNORMAL HIGH (ref 70–99)
Potassium: 4.2 mEq/L (ref 3.5–5.1)
Sodium: 137 mEq/L (ref 135–145)
Total Bilirubin: 0.8 mg/dL (ref 0.2–1.2)
Total Protein: 7.4 g/dL (ref 6.0–8.3)

## 2017-03-23 LAB — CBC WITH DIFFERENTIAL/PLATELET
Basophils Absolute: 0.2 10*3/uL — ABNORMAL HIGH (ref 0.0–0.1)
Basophils Relative: 1.3 % (ref 0.0–3.0)
Eosinophils Absolute: 0.1 10*3/uL (ref 0.0–0.7)
Eosinophils Relative: 0.3 % (ref 0.0–5.0)
HCT: 40.9 % (ref 36.0–46.0)
Hemoglobin: 13.9 g/dL (ref 12.0–15.0)
Lymphocytes Relative: 18.8 % (ref 12.0–46.0)
Lymphs Abs: 2.7 10*3/uL (ref 0.7–4.0)
MCHC: 33.9 g/dL (ref 30.0–36.0)
MCV: 90.8 fl (ref 78.0–100.0)
Monocytes Absolute: 1.4 10*3/uL — ABNORMAL HIGH (ref 0.1–1.0)
Monocytes Relative: 9.9 % (ref 3.0–12.0)
Neutro Abs: 10.1 10*3/uL — ABNORMAL HIGH (ref 1.4–7.7)
Neutrophils Relative %: 69.7 % (ref 43.0–77.0)
Platelets: 214 10*3/uL (ref 150.0–400.0)
RBC: 4.5 Mil/uL (ref 3.87–5.11)
RDW: 13.6 % (ref 11.5–15.5)
WBC: 14.5 10*3/uL — ABNORMAL HIGH (ref 4.0–10.5)

## 2017-03-23 NOTE — Addendum Note (Signed)
Addended by: Ellamae Sia on: 03/23/2017 04:34 PM   Modules accepted: Orders

## 2017-03-23 NOTE — Assessment & Plan Note (Signed)
With history of abdominal pain but abd is not sore at this point.  Discussed with patient about differential. Reasonable to check for C. difficile. Check routine labs. UA discussed with patient, urine cultures pending. At this point hold antibiotics until more data is available since she is nontoxic. She agrees. Okay for outpatient follow-up. Supportive care the meantime.

## 2017-03-24 ENCOUNTER — Telehealth: Payer: Self-pay | Admitting: *Deleted

## 2017-03-24 ENCOUNTER — Other Ambulatory Visit: Payer: Self-pay | Admitting: Family Medicine

## 2017-03-24 LAB — C. DIFFICILE GDH AND TOXIN A/B
C. difficile GDH: NOT DETECTED
C. difficile Toxin A/B: NOT DETECTED

## 2017-03-24 LAB — URINE CULTURE

## 2017-03-24 MED ORDER — FLUCONAZOLE 150 MG PO TABS: 150.0000 mg | ORAL_TABLET | Freq: Once | ORAL | 0 refills | Status: AC

## 2017-03-24 MED ORDER — SULFAMETHOXAZOLE-TRIMETHOPRIM 400-80 MG PO TABS: 1.0000 | ORAL_TABLET | Freq: Two times a day (BID) | ORAL | 0 refills | Status: DC

## 2017-03-24 NOTE — Telephone Encounter (Signed)
Patient states she usually gets a yeast infection with ABX and asks for something for that as well.  [facility] [address].

## 2017-03-25 NOTE — Assessment & Plan Note (Signed)
Consider stool cx/c diff if UA neg

## 2017-03-25 NOTE — Assessment & Plan Note (Signed)
?   UTI vs acute diarrheal illness; I suspect UTI, will place labs asap to be done but not clear if pt will follow through due to EPIC down at time of visit.

## 2017-03-25 NOTE — Patient Instructions (Signed)
Pt left without AVS or instructions other than verbal as EPIC down at time of visit  Please continue all other medications as before, and refills have been done if requested.  Please have the pharmacy call with any other refills you may need.  Please keep your appointments with your specialists as you may have planned  Please go to the LAB in the Basement (turn left off the elevator) for the tests to be done today  You will be contacted by phone if any changes need to be made immediately.  Otherwise, you will receive a letter about your results with an explanation, but please check with MyChart first.

## 2017-03-25 NOTE — Assessment & Plan Note (Signed)
stable overall by history and exam, and pt to continue medical treatment as before,  to f/u any worsening symptoms or concerns 

## 2017-03-25 NOTE — Assessment & Plan Note (Signed)
Lab Results  Component Value Date   HGBA1C 7.0 (H) 11/02/2016  stable overall by history and exam, recent data reviewed with pt, and pt to continue medical treatment as before,  to f/u any worsening symptoms or concerns

## 2017-03-25 NOTE — Progress Notes (Signed)
Subjective:    Patient ID: Allison Yoder, female    DOB: 28-Feb-1951, 66 y.o.   MRN: [id number]  HPI  Here with acute onset illness c/w 1-2 days onset diffuse aches, chills, HA, crampy abd pains lower abd with several loose stools x 5, Denies worsening reflux, dysphagia, n/v, or blood.  Has occasional aching of the right flank as well.  Denies urinary symptoms such as dysuria, frequency, urgency, flank pain, hematuria, except has had mild urinary retention. Has hx of recurrent UTI on macrodantin.  Pt denies polydipsia, polyuria, or low sugar symptoms   Due for DM labs. Unfortunately during this exam the EPIC computer system is down > 30 minutes, unable to place orders or give AVS; pt has left without. Past Medical History:  Diagnosis Date  . Allergy   . Anemia    "all the time as a child"  . Anxiety   . Arthritis    "all my joints; worse in my hands" (10/27/2015)  . Chronic lower back pain   . Depression   . Diverticulosis   . Fatty liver   . GERD (gastroesophageal reflux disease)   . Heart murmur    "dx'd years ago; very mild; never treated" (10/27/2015)  . Hyperlipidemia   . Hypertension   . Hypothyroidism 2002-~ 2010  . Iritis    per [facility]  . Migraines    "stopped when I went thru menopause"  . OSA on CPAP   . Pneumonia ~ 2013; 10/27/2015  . Recurrent urinary tract infection   . Snores   . Stroke ([place])   . Type II diabetes mellitus ([place])    "lost weight; started exercising again; don't have it anymore" (10/27/2015)  . Urinary incontinence   . Wears glasses    Past Surgical History:  Procedure Laterality Date  . BLEPHAROPLASTY Bilateral 2013; 2016  . BREAST LUMPECTOMY Right 1964   benign tumor,  . HEMORRHOID BANDING  X 2  . KNEE ARTHROSCOPY Left 2016   "meniscus repair"  . PELVIC FLOOR REPAIR  2001   "lift"  . POLYPECTOMY  X 3   "bladder"  . SHOULDER ARTHROSCOPY W/ ROTATOR CUFF REPAIR Left 2013  . SHOULDER ARTHROSCOPY W/ ROTATOR CUFF REPAIR Right 2015    . TUBAL LIGATION  ~ 1986  . VAGINAL HYSTERECTOMY  1992   Partial    reports that she quit smoking about 31 years ago. Her smoking use included Cigarettes. She has a 10.00 pack-year smoking history. She has never used smokeless tobacco. She reports that she drinks about 1.8 oz of alcohol per week . She reports that she does not use drugs. family history includes Alcohol abuse in her father; Arthritis in her maternal grandmother, mother, and sister; Breast cancer in her cousin; Dementia in her paternal grandmother; Depression in her mother; Diabetes in her maternal grandmother, mother, paternal uncle, and sister; Heart disease in her mother, paternal aunt, paternal aunt, paternal uncle, and paternal uncle; Hyperlipidemia in her mother; Hypertension in her mother; Lung cancer in her father; Stroke in her maternal grandmother, mother, and paternal aunt. Allergies  Allergen Reactions  . Cephalexin Itching  . Codeine Itching  . Inapsine [Droperidol] Shortness Of Breath, Anxiety and Hypertension    Elevated HR and BP, panic attack  . Lipitor [Atorvastatin Calcium]     Myalgias   . Niacin And Related     Flushing  . Pravastatin     Myalgias  . Adhesive [Tape] Rash    Blisters  with tape   Current Outpatient Prescriptions on File Prior to Visit  Medication Sig Dispense Refill  . albuterol (PROVENTIL HFA;VENTOLIN HFA) 108 (90 Base) MCG/ACT inhaler Inhale 1-2 puffs into the lungs every 6 (six) hours as needed for wheezing or shortness of breath. 1 Inhaler 1  . atenolol (TENORMIN) 50 MG tablet Take 1 tablet (50 mg total) by mouth daily. 90 tablet 1  . Blood Glucose Monitoring Suppl (ONE TOUCH ULTRA 2) w/Device KIT Check blood sugar once daily and as instructed. Dx E11.9 1 each 0  . Calcium-Vitamin D 600-200 MG-UNIT per tablet Take 1 tablet by mouth 2 (two) times daily.    . cetirizine (ZYRTEC) 10 MG tablet Take 10 mg by mouth daily as needed for allergies.     . citalopram (CELEXA) 20 MG tablet  Take 30 mg by mouth daily.    . clonazePAM (KLONOPIN) 0.5 MG tablet Take 0.5 mg by mouth daily as needed for anxiety.    Marland Kitchen ezetimibe-simvastatin (VYTORIN) 10-40 MG tablet Take 1 tablet by mouth daily. 90 tablet 3  . fluticasone (FLONASE) 50 MCG/ACT nasal spray Place into both nostrils daily as needed for allergies or rhinitis.    Marland Kitchen glucose blood (ONE TOUCH ULTRA TEST) test strip Check blood sugar once daily and as instructed. Dx E11.9 100 each 1  . hydrocortisone (ANUSOL-HC) 25 MG suppository Place 25 mg rectally daily as needed for hemorrhoids or itching.    Marland Kitchen lisinopril (PRINIVIL,ZESTRIL) 10 MG tablet TAKE 1 TABLET BY MOUTH AT  BEDTIME 90 tablet 2  . metFORMIN (GLUCOPHAGE) 500 MG tablet TAKE 1 TABLET BY MOUTH TWO  TIMES DAILY WITH A MEAL 180 tablet 2  . Multiple Vitamin (MULTIVITAMIN) tablet Take 1 tablet by mouth daily.    Marland Kitchen NITROFURANTOIN PO Take 100 mg by mouth daily.    [name] DELICA LANCETS 34D MISC Check blood sugar once daily and as instructed. Dx E11.9 100 each 1  . phenazopyridine (PYRIDIUM) 200 MG tablet Take 200 mg by mouth daily as needed for pain.     No current facility-administered medications on file prior to visit.    Review of Systems All otherwise neg per pt     Objective:   Physical Exam BP 118/72   Pulse 79   Ht 5' 6.5" (1.689 m)   Wt 178 lb (80.7 kg)   SpO2 99%   BMI 28.30 kg/m  VS noted, mild flushed and mild ill appearing Constitutional: Pt appears in NAD HENT: Head: NCAT.  Right Ear: External ear normal.  Left Ear: External ear normal.  Eyes: . Pupils are equal, round, and reactive to light. Conjunctivae and EOM are normal Nose: without d/c or deformity Neck: Neck supple. Gross normal ROM Cardiovascular: Normal rate and regular rhythm.   Pulmonary/Chest: Effort normal and breath sounds without rales or wheezing.  Abd:  Soft, NT, ND, + BS, no organomegaly, no guarding or rebound, no flank tender Neurological: Pt is alert. At baseline orientation,  motor grossly intact Skin: Skin is warm. No rashes, other new lesions, no LE edema Psychiatric: Pt behavior is normal without agitation  No other exam findings    Assessment & Plan:

## 2017-03-26 NOTE — Telephone Encounter (Signed)
Diflucan already sent. See result notes.

## 2017-03-28 ENCOUNTER — Encounter: Payer: Self-pay | Admitting: Internal Medicine

## 2017-03-28 ENCOUNTER — Other Ambulatory Visit: Payer: Medicare Other

## 2017-03-28 ENCOUNTER — Telehealth: Payer: Self-pay

## 2017-03-28 DIAGNOSIS — R109 Unspecified abdominal pain: Secondary | ICD-10-CM

## 2017-03-28 NOTE — Telephone Encounter (Signed)
Patient says she is not in pain currently, just having some cramps interittently and feels achey all over and lethargic.  Patient says she felt better after the Tramadol but it took a couple of hours after taking the medication for it to relieve some of the discomfort.  No dysuria and no diarrhea, no fevers.  Patient states she really does not feel like trying to get dressed to get a CT today (even if it could be scheduled today).  She prefers to wait until morning to see how she does tonight and will call in early tomorrow morning with an update.

## 2017-03-28 NOTE — Telephone Encounter (Signed)
Patient advised.

## 2017-03-28 NOTE — Telephone Encounter (Signed)
What is her status currently? How much pain currently? Did the tramadol help? Any dysuria? And diarrhea?  If continued sig pain then she may need CT abd/pelvis.  Let me know and we'll see about making arrangements.   Thanks.

## 2017-03-28 NOTE — Telephone Encounter (Signed)
Ordered CT.  If profound pain overnight, then advise ER.  We should recheck her labs when possible, here or elsewhere.  Ordered.  Thanks.

## 2017-03-28 NOTE — Telephone Encounter (Signed)
Pt was seen 03/22/17; not feel well enough to come in for appt; pt stayed out of work today. From midnight to 6 AM pt was in excruciating abd pain; pt said it feels like a very bad episode of menstrual cramps. Pt took tramadol this morning and went to sleep about 7 am until 11 AM. Pt is seeing no bleeding;no fever. What else to do. [facility] [place] [facility] request cb.

## 2017-03-28 NOTE — Telephone Encounter (Signed)
Pt left v/m; since pt has spoken with Lugene pt has started having a lot of pain again. Pt wants to get CT abd/pelvis on 03/29/17 ASAP. Pt request cb with appt.

## 2017-03-29 ENCOUNTER — Telehealth: Payer: Self-pay | Admitting: *Deleted

## 2017-03-29 ENCOUNTER — Ambulatory Visit (INDEPENDENT_AMBULATORY_CARE_PROVIDER_SITE_OTHER)
Admission: RE | Admit: 2017-03-29 | Discharge: 2017-03-29 | Disposition: A | Discharge status: Home / Self Care | Payer: Medicare Other | Source: Ambulatory Visit | Attending: Family Medicine | Admitting: Family Medicine

## 2017-03-29 ENCOUNTER — Other Ambulatory Visit: Payer: Self-pay | Admitting: Family Medicine

## 2017-03-29 DIAGNOSIS — R109 Unspecified abdominal pain: Secondary | ICD-10-CM | POA: Diagnosis not present

## 2017-03-29 DIAGNOSIS — N281 Cyst of kidney, acquired: Secondary | ICD-10-CM | POA: Diagnosis not present

## 2017-03-29 MED ORDER — IOPAMIDOL (ISOVUE-300) INJECTION 61%
100.0000 mL | Freq: Once | INTRAVENOUS | Status: AC | PRN
  Administered 2017-03-29: 100 mL via INTRAVENOUS

## 2017-03-29 MED ORDER — CIPROFLOXACIN HCL 500 MG PO TABS: 500.0000 mg | ORAL_TABLET | Freq: Two times a day (BID) | ORAL | 0 refills | Status: DC

## 2017-03-29 MED ORDER — METRONIDAZOLE 500 MG PO TABS: 500.0000 mg | ORAL_TABLET | Freq: Three times a day (TID) | ORAL | 0 refills | Status: DC

## 2017-03-29 NOTE — Telephone Encounter (Signed)
Shirlean Mylar wanted to let you know that Marzetta Board from CT called and wanted to let you know that the CT results are in Epic for you to look at.

## 2017-03-29 NOTE — Telephone Encounter (Signed)
Patient notified of test results per Dr. Damita Dunnings.

## 2017-03-30 ENCOUNTER — Telehealth: Payer: Self-pay

## 2017-03-30 NOTE — Telephone Encounter (Signed)
As long as she isn't worse today then I would take that as progress.  Update me as needed.  I would expect her to gradually improve in the next few days.  Clear liquid diet until the pain is better.  Many thanks for the update.

## 2017-03-30 NOTE — Telephone Encounter (Signed)
Pt left v/m; pt was to cb with update; pt said if she is up moving around pt still uncomfortable and has some pain.if pt stays in bed pt feels blah but feels OK. Pt is not going back to work today and is not sure if she will work on 03/31/17. FYI to Dr Damita Dunnings.

## 2017-03-30 NOTE — Telephone Encounter (Signed)
Patient advised.

## 2017-04-06 ENCOUNTER — Telehealth: Payer: Self-pay | Admitting: Family Medicine

## 2017-04-06 NOTE — Telephone Encounter (Addendum)
Glad to hear that.  Many thanks.

## 2017-04-06 NOTE — Telephone Encounter (Signed)
Patient wanted to let Dr.Duncan know she's feeling better.  She's gradually going back to a normal diet.

## 2017-04-12 DIAGNOSIS — K210 Gastro-esophageal reflux disease with esophagitis: Secondary | ICD-10-CM | POA: Diagnosis not present

## 2017-04-12 DIAGNOSIS — K5732 Diverticulitis of large intestine without perforation or abscess without bleeding: Secondary | ICD-10-CM | POA: Diagnosis not present

## 2017-04-26 DIAGNOSIS — M5030 Other cervical disc degeneration, unspecified cervical region: Secondary | ICD-10-CM | POA: Diagnosis not present

## 2017-04-26 DIAGNOSIS — M9902 Segmental and somatic dysfunction of thoracic region: Secondary | ICD-10-CM | POA: Diagnosis not present

## 2017-04-26 DIAGNOSIS — M62830 Muscle spasm of back: Secondary | ICD-10-CM | POA: Diagnosis not present

## 2017-04-26 DIAGNOSIS — R51 Headache: Secondary | ICD-10-CM | POA: Diagnosis not present

## 2017-04-26 DIAGNOSIS — M546 Pain in thoracic spine: Secondary | ICD-10-CM | POA: Diagnosis not present

## 2017-04-26 DIAGNOSIS — M9900 Segmental and somatic dysfunction of head region: Secondary | ICD-10-CM | POA: Diagnosis not present

## 2017-04-26 DIAGNOSIS — M542 Cervicalgia: Secondary | ICD-10-CM | POA: Diagnosis not present

## 2017-04-26 DIAGNOSIS — M9901 Segmental and somatic dysfunction of cervical region: Secondary | ICD-10-CM | POA: Diagnosis not present

## 2017-05-02 ENCOUNTER — Other Ambulatory Visit: Payer: Self-pay | Admitting: Family Medicine

## 2017-05-03 DIAGNOSIS — R300 Dysuria: Secondary | ICD-10-CM | POA: Diagnosis not present

## 2017-05-03 DIAGNOSIS — N3020 Other chronic cystitis without hematuria: Secondary | ICD-10-CM | POA: Diagnosis not present

## 2017-05-05 ENCOUNTER — Encounter: Payer: Self-pay | Admitting: Podiatry

## 2017-05-05 ENCOUNTER — Ambulatory Visit (INDEPENDENT_AMBULATORY_CARE_PROVIDER_SITE_OTHER): Payer: Medicare Other | Admitting: Podiatry

## 2017-05-05 DIAGNOSIS — L600 Ingrowing nail: Secondary | ICD-10-CM | POA: Diagnosis not present

## 2017-05-05 DIAGNOSIS — E119 Type 2 diabetes mellitus without complications: Secondary | ICD-10-CM | POA: Insufficient documentation

## 2017-05-05 DIAGNOSIS — I639 Cerebral infarction, unspecified: Secondary | ICD-10-CM | POA: Diagnosis not present

## 2017-05-05 DIAGNOSIS — E1165 Type 2 diabetes mellitus with hyperglycemia: Secondary | ICD-10-CM | POA: Insufficient documentation

## 2017-05-05 DIAGNOSIS — N390 Urinary tract infection, site not specified: Secondary | ICD-10-CM | POA: Insufficient documentation

## 2017-05-05 NOTE — Progress Notes (Signed)
   Subjective:    Patient ID: Allison Yoder, female    DOB: Oct 09, 1950, 66 y.o.   MRN: [id number]  HPI Chief Complaint  Patient presents with  . Toe Pain    Hallux right - lateral border, red and swollen and tender x 1 month, tried trimming      Review of Systems  All other systems reviewed and are negative.      Objective:   Physical Exam        Assessment & Plan:

## 2017-05-05 NOTE — Patient Instructions (Signed)

## 2017-05-08 NOTE — Progress Notes (Signed)
Subjective:    Patient ID: Allison Yoder, female   DOB: 66 y.o.   MRN: [id number]   HPI patient presents with long-term pain in the ingrown toenails of both feet stating that there is sore and making it hard for her to wear [place] gear comfortably    Review of Systems  All other systems reviewed and are negative.       Objective:  Physical Exam  Constitutional: She appears well-developed and well-nourished.  Cardiovascular: Intact distal pulses.   Musculoskeletal: Normal range of motion.  Neurological: She is alert.  Skin: Skin is warm.  Nursing note and vitals reviewed.  neurovascular status intact muscle strength adequate with range of motion within normal limits. Patient's noted to have incurvated lateral borders the hallux bilateral that are painful when pressed and making Vice gear difficult and has redness in the distal tips but no active drainage. Found have good digital perfusion well oriented 3     Assessment:   Chronic ingrown toenail hallux bilateral      Plan:    H&P conditions reviewed and treatment options discussed. I've recommended removal of the nail borders and I explained procedure and risk and today I went ahead and I infiltrated the hallux bilateral 60 mg Xylocaine Marcaine mixture I went ahead and remove the lateral borders exposing matrix and applied phenol 3 applications 30 seconds followed by alcohol lavaged sterile dressing. Gave instructions on soaks and reappoint

## 2017-05-18 ENCOUNTER — Encounter: Payer: Self-pay | Admitting: Family Medicine

## 2017-05-18 ENCOUNTER — Encounter: Payer: Self-pay | Admitting: Cardiovascular Disease

## 2017-05-18 ENCOUNTER — Ambulatory Visit (INDEPENDENT_AMBULATORY_CARE_PROVIDER_SITE_OTHER): Payer: Medicare Other | Admitting: Family Medicine

## 2017-05-18 VITALS — BP 136/64 | HR 59 | Temp 97.9°F | Wt 179.0 lb

## 2017-05-18 DIAGNOSIS — I639 Cerebral infarction, unspecified: Secondary | ICD-10-CM

## 2017-05-18 DIAGNOSIS — M6740 Ganglion, unspecified site: Secondary | ICD-10-CM

## 2017-05-18 DIAGNOSIS — E119 Type 2 diabetes mellitus without complications: Secondary | ICD-10-CM

## 2017-05-18 DIAGNOSIS — K760 Fatty (change of) liver, not elsewhere classified: Secondary | ICD-10-CM | POA: Diagnosis not present

## 2017-05-18 LAB — HEMOGLOBIN A1C: Hgb A1c MFr Bld: 7.1 % — ABNORMAL HIGH (ref 4.6–6.5)

## 2017-05-18 NOTE — Progress Notes (Signed)
Her son is doing well.  D/w pt.  She is happy about that.    Diabetes:  Using medications without difficulties:yes Hypoglycemic episodes:no sx unless she has prolonged fasting.   Hyperglycemic episodes:no sx Feet problems: she had ingrown nails removed recently.   Blood Sugars averaging:not checked often eye exam within last year: done about 6 months ago.  No retinopathy per patient report- at Dr. Judithe [facility] clinic.   A1c pending.   Fatty liver d/w pt.  D/w pt about diet and exercise.  Recheck LFTs in summer 2018 wnl.    Her abd pain didn't get better with PPI but got sig better with dicyclomine.  No ADE on med.    Mass in L wrist, flexor side, variable size, can be tender.  No trauma.  Present for a few months.    Flu shot done at work.    Meds, vitals, and allergies reviewed.   ROS: Per HPI unless specifically indicated in ROS section   GEN: nad, alert and oriented HEENT: mucous membranes moist NECK: supple w/o LA CV: rrr. PULM: ctab, no inc wob ABD: soft, +bs EXT: no edema SKIN: no acute rash L wrist with ganglion cyst noted.

## 2017-05-18 NOTE — Patient Instructions (Signed)
Rosaria Ferries will call about your referral. Go to the lab on the way out.  We'll contact you with your lab report. Take care.  Glad to see you.  Update me as needed.

## 2017-05-19 DIAGNOSIS — K760 Fatty (change of) liver, not elsewhere classified: Secondary | ICD-10-CM | POA: Insufficient documentation

## 2017-05-19 DIAGNOSIS — M6740 Ganglion, unspecified site: Secondary | ICD-10-CM | POA: Insufficient documentation

## 2017-05-19 NOTE — Assessment & Plan Note (Signed)
No improvement with PPI but she got significantly better with dicyclomine. She will check her dose at home and update me so we can refill his med.

## 2017-05-19 NOTE — Assessment & Plan Note (Signed)
Tender. Does not appear infected. Refer to hand surgery. She agrees.

## 2017-05-19 NOTE — Assessment & Plan Note (Signed)
A1c pending. See notes on labs. Continue work on diet and exercise. Discussed with patient. She agrees.

## 2017-05-19 NOTE — Assessment & Plan Note (Signed)
Path/phys d/w pt.  D/w pt about diet and exercise.  Recheck LFTs in summer 2018 wnl.  No sign of overt liver disease.

## 2017-05-20 ENCOUNTER — Other Ambulatory Visit: Payer: Self-pay | Admitting: Family Medicine

## 2017-05-20 MED ORDER — DICYCLOMINE HCL 10 MG PO CAPS: 10.0000 mg | ORAL_CAPSULE | Freq: Two times a day (BID) | ORAL | 3 refills | Status: DC | PRN

## 2017-05-26 MED ORDER — DICYCLOMINE HCL 10 MG PO CAPS: 10.0000 mg | ORAL_CAPSULE | Freq: Two times a day (BID) | ORAL | 3 refills | Status: DC | PRN

## 2017-05-26 NOTE — Addendum Note (Signed)
Addended by: Lurlean Nanny on: 05/26/2017 07:32 AM   Modules accepted: Orders

## 2017-05-29 DIAGNOSIS — R350 Frequency of micturition: Secondary | ICD-10-CM | POA: Diagnosis not present

## 2017-05-29 DIAGNOSIS — N3020 Other chronic cystitis without hematuria: Secondary | ICD-10-CM | POA: Diagnosis not present

## 2017-05-29 NOTE — Telephone Encounter (Signed)
Pt wanted to verify dicyclomine was sent to [facility]. Advised pt sent electronically on 05/26/17 to [facility]. Pt voiced understanding.

## 2017-06-01 DIAGNOSIS — M1812 Unilateral primary osteoarthritis of first carpometacarpal joint, left hand: Secondary | ICD-10-CM | POA: Diagnosis not present

## 2017-06-01 DIAGNOSIS — M67432 Ganglion, left wrist: Secondary | ICD-10-CM | POA: Diagnosis not present

## 2017-06-15 DIAGNOSIS — R8271 Bacteriuria: Secondary | ICD-10-CM | POA: Diagnosis not present

## 2017-06-15 DIAGNOSIS — N3021 Other chronic cystitis with hematuria: Secondary | ICD-10-CM | POA: Diagnosis not present

## 2017-06-18 ENCOUNTER — Encounter: Payer: Self-pay | Admitting: Family Medicine

## 2017-06-19 ENCOUNTER — Encounter: Payer: Self-pay | Admitting: Family Medicine

## 2017-06-23 DIAGNOSIS — N952 Postmenopausal atrophic vaginitis: Secondary | ICD-10-CM | POA: Diagnosis not present

## 2017-07-05 DIAGNOSIS — M546 Pain in thoracic spine: Secondary | ICD-10-CM | POA: Diagnosis not present

## 2017-07-05 DIAGNOSIS — M9901 Segmental and somatic dysfunction of cervical region: Secondary | ICD-10-CM | POA: Diagnosis not present

## 2017-07-05 DIAGNOSIS — M62830 Muscle spasm of back: Secondary | ICD-10-CM | POA: Diagnosis not present

## 2017-07-05 DIAGNOSIS — M5030 Other cervical disc degeneration, unspecified cervical region: Secondary | ICD-10-CM | POA: Diagnosis not present

## 2017-07-05 DIAGNOSIS — M9900 Segmental and somatic dysfunction of head region: Secondary | ICD-10-CM | POA: Diagnosis not present

## 2017-07-05 DIAGNOSIS — M9902 Segmental and somatic dysfunction of thoracic region: Secondary | ICD-10-CM | POA: Diagnosis not present

## 2017-07-05 DIAGNOSIS — M542 Cervicalgia: Secondary | ICD-10-CM | POA: Diagnosis not present

## 2017-07-05 DIAGNOSIS — R51 Headache: Secondary | ICD-10-CM | POA: Diagnosis not present

## 2017-07-12 DIAGNOSIS — M9901 Segmental and somatic dysfunction of cervical region: Secondary | ICD-10-CM | POA: Diagnosis not present

## 2017-07-12 DIAGNOSIS — M5030 Other cervical disc degeneration, unspecified cervical region: Secondary | ICD-10-CM | POA: Diagnosis not present

## 2017-07-12 DIAGNOSIS — M9902 Segmental and somatic dysfunction of thoracic region: Secondary | ICD-10-CM | POA: Diagnosis not present

## 2017-07-12 DIAGNOSIS — M9900 Segmental and somatic dysfunction of head region: Secondary | ICD-10-CM | POA: Diagnosis not present

## 2017-07-12 DIAGNOSIS — M542 Cervicalgia: Secondary | ICD-10-CM | POA: Diagnosis not present

## 2017-07-12 DIAGNOSIS — R51 Headache: Secondary | ICD-10-CM | POA: Diagnosis not present

## 2017-07-12 DIAGNOSIS — M62830 Muscle spasm of back: Secondary | ICD-10-CM | POA: Diagnosis not present

## 2017-07-12 DIAGNOSIS — M546 Pain in thoracic spine: Secondary | ICD-10-CM | POA: Diagnosis not present

## 2017-07-19 DIAGNOSIS — M62830 Muscle spasm of back: Secondary | ICD-10-CM | POA: Diagnosis not present

## 2017-07-19 DIAGNOSIS — M5030 Other cervical disc degeneration, unspecified cervical region: Secondary | ICD-10-CM | POA: Diagnosis not present

## 2017-07-19 DIAGNOSIS — R51 Headache: Secondary | ICD-10-CM | POA: Diagnosis not present

## 2017-07-19 DIAGNOSIS — M9900 Segmental and somatic dysfunction of head region: Secondary | ICD-10-CM | POA: Diagnosis not present

## 2017-07-19 DIAGNOSIS — M9901 Segmental and somatic dysfunction of cervical region: Secondary | ICD-10-CM | POA: Diagnosis not present

## 2017-07-19 DIAGNOSIS — M546 Pain in thoracic spine: Secondary | ICD-10-CM | POA: Diagnosis not present

## 2017-07-19 DIAGNOSIS — M9902 Segmental and somatic dysfunction of thoracic region: Secondary | ICD-10-CM | POA: Diagnosis not present

## 2017-07-19 DIAGNOSIS — M542 Cervicalgia: Secondary | ICD-10-CM | POA: Diagnosis not present

## 2017-07-20 DIAGNOSIS — M546 Pain in thoracic spine: Secondary | ICD-10-CM | POA: Diagnosis not present

## 2017-07-20 DIAGNOSIS — M542 Cervicalgia: Secondary | ICD-10-CM | POA: Diagnosis not present

## 2017-07-20 DIAGNOSIS — R300 Dysuria: Secondary | ICD-10-CM | POA: Diagnosis not present

## 2017-07-20 DIAGNOSIS — N3020 Other chronic cystitis without hematuria: Secondary | ICD-10-CM | POA: Diagnosis not present

## 2017-07-20 DIAGNOSIS — M62830 Muscle spasm of back: Secondary | ICD-10-CM | POA: Diagnosis not present

## 2017-07-20 DIAGNOSIS — M9902 Segmental and somatic dysfunction of thoracic region: Secondary | ICD-10-CM | POA: Diagnosis not present

## 2017-07-20 DIAGNOSIS — R51 Headache: Secondary | ICD-10-CM | POA: Diagnosis not present

## 2017-07-20 DIAGNOSIS — M9900 Segmental and somatic dysfunction of head region: Secondary | ICD-10-CM | POA: Diagnosis not present

## 2017-07-20 DIAGNOSIS — M5030 Other cervical disc degeneration, unspecified cervical region: Secondary | ICD-10-CM | POA: Diagnosis not present

## 2017-07-20 DIAGNOSIS — M9901 Segmental and somatic dysfunction of cervical region: Secondary | ICD-10-CM | POA: Diagnosis not present

## 2017-07-21 ENCOUNTER — Ambulatory Visit (INDEPENDENT_AMBULATORY_CARE_PROVIDER_SITE_OTHER): Payer: Medicare Other | Admitting: Family Medicine

## 2017-07-21 ENCOUNTER — Encounter: Payer: Self-pay | Admitting: Family Medicine

## 2017-07-21 VITALS — BP 112/68 | HR 86 | Temp 98.3°F | Wt 185.5 lb

## 2017-07-21 DIAGNOSIS — Z8673 Personal history of transient ischemic attack (TIA), and cerebral infarction without residual deficits: Secondary | ICD-10-CM

## 2017-07-21 DIAGNOSIS — S39012A Strain of muscle, fascia and tendon of lower back, initial encounter: Secondary | ICD-10-CM

## 2017-07-21 MED ORDER — CYCLOBENZAPRINE HCL 10 MG PO TABS: 10.0000 mg | ORAL_TABLET | Freq: Three times a day (TID) | ORAL | 0 refills | Status: DC | PRN

## 2017-07-21 MED ORDER — PREDNISONE 10 MG PO TABS: ORAL_TABLET | ORAL | 0 refills | Status: DC

## 2017-07-21 MED ORDER — HYDROCORTISONE ACETATE 25 MG RE SUPP: 25.0000 mg | Freq: Every day | RECTAL | 1 refills | Status: DC | PRN

## 2017-07-21 NOTE — Progress Notes (Signed)
Subjective:    Patient ID: Allison Yoder, female    DOB: 25-May-1951, 66 y.o.   MRN: [id number]  HPI This is a 66 yo female, accompanied by her SO,  who presents today with muscle spasms of low back x 6 days.  She picked up a case of water bottles 7 days ago, had pain the next day. Has been to chiropractor 3 times this week. Some improvement in pain, but continues to have intermittent muscle spasms. Was told she needed an antiinflammatory and muscle relaxer. Has not been exercising lately. Unable to take NSAIDs due to stomach sensitivity but is able to take prednisone. No UE/LE weakness, no radiating of pain, no loss of bowel or bladder function. No falls.   Past Medical History:  Diagnosis Date  . Allergy   . Anemia    "all the time as a child"  . Anxiety   . Arthritis    "all my joints; worse in my hands" (10/27/2015)  . Chronic lower back pain   . Depression   . Diverticulosis   . Fatty liver   . GERD (gastroesophageal reflux disease)   . Heart murmur    "dx'd years ago; very mild; never treated" (10/27/2015)  . Hyperlipidemia   . Hypertension   . Hypothyroidism 2002-~ 2010  . Iritis    per [facility]  . Migraines    "stopped when I went thru menopause"  . OSA on CPAP   . Pneumonia ~ 2013; 10/27/2015  . Recurrent urinary tract infection   . Snores   . Stroke ([place])   . Type II diabetes mellitus ([place])    "lost weight; started exercising again; don't have it anymore" (10/27/2015)  . Urinary incontinence   . Wears glasses    Past Surgical History:  Procedure Laterality Date  . BLEPHAROPLASTY Bilateral 2013; 2016  . BREAST LUMPECTOMY Right 1964   benign tumor,  . HEMORRHOID BANDING  X 2  . KNEE ARTHROSCOPY Left 2016   "meniscus repair"  . PELVIC FLOOR REPAIR  2001   "lift"  . POLYPECTOMY  X 3   "bladder"  . SHOULDER ARTHROSCOPY W/ ROTATOR CUFF REPAIR Left 2013  . SHOULDER ARTHROSCOPY W/ ROTATOR CUFF REPAIR Right 2015  . TUBAL LIGATION  ~ 1986  . VAGINAL  HYSTERECTOMY  1992   Partial   Family History  Problem Relation Age of Onset  . Arthritis Mother   . Heart disease Mother   . Hyperlipidemia Mother   . Hypertension Mother   . Diabetes Mother   . Depression Mother   . Stroke Mother   . Alcohol abuse Father   . Lung cancer Father   . Arthritis Maternal Grandmother   . Stroke Maternal Grandmother   . Diabetes Maternal Grandmother   . Heart disease Paternal Uncle        x 2  . Arthritis Sister   . Breast cancer Cousin   . Heart disease Paternal Aunt   . Diabetes Paternal Uncle   . Diabetes Sister   . Heart disease Paternal Uncle        x 5  . Heart disease Paternal Aunt        x 3  . Stroke Paternal Aunt   . Dementia Paternal Grandmother   . Colon cancer Neg Hx    Social History   Tobacco Use  . Smoking status: Former Smoker    Packs/day: 2.00    Years: 5.00    Pack years:  10.00    Types: Cigarettes    Last attempt to quit: 09/12/1985    Years since quitting: 31.8  . Smokeless tobacco: Never Used  Substance Use Topics  . Alcohol use: Yes    Alcohol/week: 1.8 oz    Types: 2 Shots of liquor, 1 Glasses of wine per week    Comment: weekends  . Drug use: No      Review of Systems Per HPI    Objective:   Physical Exam  Constitutional: She is oriented to person, place, and time. She appears well-developed and well-nourished. No distress.  HENT:  Head: Normocephalic and atraumatic.  Eyes: Conjunctivae and EOM are normal. Pupils are equal, round, and reactive to light. Right eye exhibits no discharge. Left eye exhibits no discharge.  Neck: Normal range of motion. Neck supple.  Cardiovascular: Normal rate, regular rhythm and normal heart sounds.  Pulmonary/Chest: Effort normal and breath sounds normal.  Musculoskeletal:       Cervical back: Normal.       Thoracic back: Normal.       Lumbar back: She exhibits tenderness (left paraspinal). She exhibits normal range of motion and no bony tenderness.    Lymphadenopathy:    She has no cervical adenopathy.  Neurological: She is alert and oriented to person, place, and time. She has normal reflexes. No cranial nerve deficit.  Skin: Skin is warm and dry. She is not diaphoretic.  Psychiatric: She has a normal mood and affect. Her behavior is normal. Judgment and thought content normal.  Vitals reviewed.     BP 112/68 (BP Location: Left Arm, Patient Position: Sitting, Cuff Size: Normal)   Pulse 86   Temp 98.3 F (36.8 C) (Oral)   Wt 185 lb 8 oz (84.1 kg)   SpO2 97%   BMI 29.49 kg/m  Wt Readings from Last 3 Encounters:  07/21/17 185 lb 8 oz (84.1 kg)  05/18/17 179 lb (81.2 kg)  03/22/17 181 lb (82.1 kg)       Assessment & Plan:  1. Strain of lumbar region, initial encounter - Provided written and verbal information regarding diagnosis and treatment. - RTC/ER precautions reviewed - encouraged stretching and provided exercises - predniSONE (DELTASONE) 10 MG tablet; Take 6 tablets for 1 day, then 5, then 4, then 3, then 2, then 1  Dispense: 21 tablet; Refill: 0- discussed potential side effects, instructed her to take with food and full glass of water - cyclobenzaprine (FLEXERIL) 10 MG tablet; Take 1 tablet (10 mg total) 3 (three) times daily as needed by mouth for muscle spasms.  Dispense: 30 tablet; Refill: 0   Clarene Reamer, FNP-BC  [facility]  07/22/2017 2:24 PM

## 2017-07-21 NOTE — Patient Instructions (Signed)

## 2017-07-22 ENCOUNTER — Encounter: Payer: Self-pay | Admitting: Family Medicine

## 2017-08-09 DIAGNOSIS — M62830 Muscle spasm of back: Secondary | ICD-10-CM | POA: Diagnosis not present

## 2017-08-09 DIAGNOSIS — M546 Pain in thoracic spine: Secondary | ICD-10-CM | POA: Diagnosis not present

## 2017-08-09 DIAGNOSIS — M5030 Other cervical disc degeneration, unspecified cervical region: Secondary | ICD-10-CM | POA: Diagnosis not present

## 2017-08-09 DIAGNOSIS — M9902 Segmental and somatic dysfunction of thoracic region: Secondary | ICD-10-CM | POA: Diagnosis not present

## 2017-08-09 DIAGNOSIS — R51 Headache: Secondary | ICD-10-CM | POA: Diagnosis not present

## 2017-08-09 DIAGNOSIS — M542 Cervicalgia: Secondary | ICD-10-CM | POA: Diagnosis not present

## 2017-08-09 DIAGNOSIS — M9901 Segmental and somatic dysfunction of cervical region: Secondary | ICD-10-CM | POA: Diagnosis not present

## 2017-08-09 DIAGNOSIS — M9900 Segmental and somatic dysfunction of head region: Secondary | ICD-10-CM | POA: Diagnosis not present

## 2017-08-14 ENCOUNTER — Telehealth: Payer: Self-pay | Admitting: *Deleted

## 2017-08-14 DIAGNOSIS — E119 Type 2 diabetes mellitus without complications: Secondary | ICD-10-CM

## 2017-08-14 NOTE — Telephone Encounter (Signed)
Copied from [place] [id number]. Topic: Appointment Scheduling - Scheduling Inquiry for Clinic >> Aug 14, 2017 11:35 AM Bea Graff, NT wrote: Reason for CRM: Patient is scheduled for an appt 08/16/17 at 6:15pm for her blood sugars running high. She would like to know if Dr. Damita Dunnings would like for her to come in and have her A1C drawn before this appt? Please contact pt.

## 2017-08-14 NOTE — Telephone Encounter (Signed)
Ordered bmet and a1c.  Thanks.

## 2017-08-14 NOTE — Telephone Encounter (Signed)
Lab appt scheduled for 08/15/17

## 2017-08-15 ENCOUNTER — Other Ambulatory Visit (INDEPENDENT_AMBULATORY_CARE_PROVIDER_SITE_OTHER): Payer: Medicare Other

## 2017-08-15 DIAGNOSIS — E119 Type 2 diabetes mellitus without complications: Secondary | ICD-10-CM

## 2017-08-15 DIAGNOSIS — Z79899 Other long term (current) drug therapy: Secondary | ICD-10-CM

## 2017-08-15 DIAGNOSIS — R109 Unspecified abdominal pain: Secondary | ICD-10-CM

## 2017-08-15 LAB — CBC WITH DIFFERENTIAL/PLATELET
Basophils Absolute: 0 10*3/uL (ref 0.0–0.1)
Basophils Relative: 0.6 % (ref 0.0–3.0)
Eosinophils Absolute: 0.1 10*3/uL (ref 0.0–0.7)
Eosinophils Relative: 1.9 % (ref 0.0–5.0)
HCT: 42.4 % (ref 36.0–46.0)
Hemoglobin: 14.1 g/dL (ref 12.0–15.0)
Lymphocytes Relative: 34.3 % (ref 12.0–46.0)
Lymphs Abs: 2.3 10*3/uL (ref 0.7–4.0)
MCHC: 33.3 g/dL (ref 30.0–36.0)
MCV: 91.6 fl (ref 78.0–100.0)
Monocytes Absolute: 0.5 10*3/uL (ref 0.1–1.0)
Monocytes Relative: 7.1 % (ref 3.0–12.0)
Neutro Abs: 3.8 10*3/uL (ref 1.4–7.7)
Neutrophils Relative %: 56.1 % (ref 43.0–77.0)
Platelets: 211 10*3/uL (ref 150.0–400.0)
RBC: 4.63 Mil/uL (ref 3.87–5.11)
RDW: 13.7 % (ref 11.5–15.5)
WBC: 6.8 10*3/uL (ref 4.0–10.5)

## 2017-08-15 LAB — COMPREHENSIVE METABOLIC PANEL
ALT: 42 U/L — ABNORMAL HIGH (ref 0–35)
AST: 34 U/L (ref 0–37)
Albumin: 4.2 g/dL (ref 3.5–5.2)
Alkaline Phosphatase: 79 U/L (ref 39–117)
BUN: 16 mg/dL (ref 6–23)
CO2: 29 mEq/L (ref 19–32)
Calcium: 9.8 mg/dL (ref 8.4–10.5)
Chloride: 102 mEq/L (ref 96–112)
Creatinine, Ser: 0.75 mg/dL (ref 0.40–1.20)
GFR: 81.9 mL/min (ref >60.00)
Glucose, Bld: 190 mg/dL — ABNORMAL HIGH (ref 70–99)
Potassium: 4.5 mEq/L (ref 3.5–5.1)
Sodium: 137 mEq/L (ref 135–145)
Total Bilirubin: 0.7 mg/dL (ref 0.2–1.2)
Total Protein: 6.7 g/dL (ref 6.0–8.3)

## 2017-08-15 LAB — LIPID PANEL
Cholesterol: 121 mg/dL (ref 0–200)
HDL: 28.6 mg/dL — ABNORMAL LOW (ref >39.00)
NonHDL: 92.28
Total CHOL/HDL Ratio: 4
Triglycerides: 202 mg/dL — ABNORMAL HIGH (ref 0.0–149.0)
VLDL: 40.4 mg/dL — ABNORMAL HIGH (ref 0.0–40.0)

## 2017-08-15 LAB — HEMOGLOBIN A1C: Hgb A1c MFr Bld: 8.2 % — ABNORMAL HIGH (ref 4.6–6.5)

## 2017-08-15 LAB — LDL CHOLESTEROL, DIRECT: Direct LDL: 66 mg/dL

## 2017-08-15 NOTE — Addendum Note (Signed)
Addended by: Ellamae Sia on: 08/15/2017 07:39 AM   Modules accepted: Orders

## 2017-08-16 ENCOUNTER — Ambulatory Visit (INDEPENDENT_AMBULATORY_CARE_PROVIDER_SITE_OTHER): Payer: Medicare Other | Admitting: Family Medicine

## 2017-08-16 ENCOUNTER — Encounter: Payer: Self-pay | Admitting: Family Medicine

## 2017-08-16 ENCOUNTER — Ambulatory Visit (INDEPENDENT_AMBULATORY_CARE_PROVIDER_SITE_OTHER)
Admission: RE | Admit: 2017-08-16 | Discharge: 2017-08-16 | Disposition: A | Discharge status: Home / Self Care | Payer: Medicare Other | Source: Ambulatory Visit | Attending: Family Medicine | Admitting: Family Medicine

## 2017-08-16 VITALS — BP 130/78 | HR 74 | Temp 98.3°F | Wt 185.0 lb

## 2017-08-16 DIAGNOSIS — M25539 Pain in unspecified wrist: Secondary | ICD-10-CM

## 2017-08-16 DIAGNOSIS — M792 Neuralgia and neuritis, unspecified: Secondary | ICD-10-CM

## 2017-08-16 DIAGNOSIS — M25532 Pain in left wrist: Secondary | ICD-10-CM | POA: Diagnosis not present

## 2017-08-16 DIAGNOSIS — M47812 Spondylosis without myelopathy or radiculopathy, cervical region: Secondary | ICD-10-CM | POA: Diagnosis not present

## 2017-08-16 DIAGNOSIS — E119 Type 2 diabetes mellitus without complications: Secondary | ICD-10-CM | POA: Diagnosis not present

## 2017-08-16 DIAGNOSIS — I639 Cerebral infarction, unspecified: Secondary | ICD-10-CM

## 2017-08-16 NOTE — Patient Instructions (Signed)
Start using a regular wrist brace on your left wrist.   Go to the lab on the way out.  We'll contact you with your xray report. Work on [employer] and increase the metformin up to 2 tabs twice a day if needed.  Update me about your sugar in about 2 weeks if not better.  Plan on recheck A1c in about 3 months.  Take care.  Glad to see you.

## 2017-08-16 NOTE — Progress Notes (Signed)
Diabetes:  Using medications without difficulties:yes Hypoglycemic episodes:no Hyperglycemic episodes:see below.   Feet problems:mild tingling in the R>L foot, near the 1st and 2nd toes.   Blood Sugars averaging: up to 200s, ~250 max.   eye exam within last year: due, d/w pt.   A1c up.  Labs d/w pt.    She had been lightheaded w/o room spinning episodically.  brief and sx resolved.  Lasted about a few minutes.   She has some occ word searching, no speech changes or loss.  No focal weakness but has been diffusely tired.   D/w pt about possible sx related to hyperglycemia.  No sx now.    Wrist pain, L.  Had eval for ganglion, s/p aspiration but still with pain with ROM and grip.  [place] fall about 3 weeks ago.    Shoulder and neck pain. Pain with neck ROM.  Pain radiating down the L arm episodically.  Had been seeing the chiropractor.  No R sided pain.    PMH and SH reviewed  Meds, vitals, and allergies reviewed.   ROS: Per HPI unless specifically indicated in ROS section   GEN: nad, alert and oriented HEENT: mucous membranes moist NECK: supple w/o LA, no midline pain but L trap ttp.  CV: rrr. PULM: ctab, no inc wob ABD: soft, +bs EXT: no edema SKIN: no acute rash L hand with normal grip but finklestein positive.  NV intact in the hand.  Normal ROM at the wrist.  No bruising, no rash.  No tendon deficit in the hand/fingers.

## 2017-08-17 ENCOUNTER — Telehealth: Payer: Self-pay

## 2017-08-17 DIAGNOSIS — M792 Neuralgia and neuritis, unspecified: Secondary | ICD-10-CM | POA: Insufficient documentation

## 2017-08-17 DIAGNOSIS — R7401 Elevation of levels of liver transaminase levels: Secondary | ICD-10-CM

## 2017-08-17 DIAGNOSIS — R740 Nonspecific elevation of levels of transaminase and lactic acid dehydrogenase [LDH]: Principal | ICD-10-CM

## 2017-08-17 DIAGNOSIS — M25539 Pain in unspecified wrist: Secondary | ICD-10-CM | POA: Insufficient documentation

## 2017-08-17 NOTE — Telephone Encounter (Signed)
Notes recorded by Michaelyn Barter, RN on 08/17/2017 at 4:25 PM EST Patient aware of lab results. Patient will have lab work done in March at her PCP office. Will put order in for LFTs. Will send copy to PCP. ------  Notes recorded by Josue Hector, MD on 08/17/2017 at 9:24 AM EST ALT minimally elevated BS too high f/u primary f/u labs in 3 months for LFTls ------

## 2017-08-17 NOTE — Telephone Encounter (Signed)
-----   Message from Josue Hector, MD sent at 08/16/2017  7:40 AM EST ----- HDL low needs more exercise f/u primary ? Has she had LFTls last 6 months continue vytorin

## 2017-08-17 NOTE — Assessment & Plan Note (Signed)
Start using a regular wrist brace on the left wrist.  Check plain films today.  See notes on imaging.

## 2017-08-17 NOTE — Assessment & Plan Note (Addendum)
She'll work on diet and increase the metformin up to 2 tabs twice a day if needed. She'll update me about sugar in about 2 weeks if not better.  Plan on recheck A1c in about 3 months.  She agrees.  Unclear if some of the sx above are related to relative hyperglycemia.  If more sx, then update me.  She agrees.  >25 minutes spent in face to face time with patient, >50% spent in counselling or coordination of care.

## 2017-08-17 NOTE — Assessment & Plan Note (Signed)
No weakness, check plain films today.  See notes on imaging.  She agrees.  Okay for outpatient f/u.

## 2017-08-18 ENCOUNTER — Other Ambulatory Visit: Payer: Self-pay | Admitting: Family Medicine

## 2017-08-18 ENCOUNTER — Telehealth: Payer: Self-pay | Admitting: Family Medicine

## 2017-08-18 DIAGNOSIS — E119 Type 2 diabetes mellitus without complications: Secondary | ICD-10-CM

## 2017-08-18 DIAGNOSIS — M792 Neuralgia and neuritis, unspecified: Secondary | ICD-10-CM

## 2017-08-18 NOTE — Telephone Encounter (Signed)
Copied from [place] [phone]. Topic: Quick Communication - Office Called Patient >> Aug 18, 2017  8:16 AM Robina Ade, Helene Kelp D wrote: Reason for CRM: Patient called and was returning office call. I could not find who called her. Please call patient back, thanks.

## 2017-09-10 ENCOUNTER — Encounter: Payer: Self-pay | Admitting: Family Medicine

## 2017-09-10 DIAGNOSIS — M8580 Other specified disorders of bone density and structure, unspecified site: Secondary | ICD-10-CM | POA: Insufficient documentation

## 2017-09-11 ENCOUNTER — Encounter: Payer: Self-pay | Admitting: Family Medicine

## 2017-09-16 ENCOUNTER — Ambulatory Visit (INDEPENDENT_AMBULATORY_CARE_PROVIDER_SITE_OTHER): Payer: Medicare Other | Admitting: Family Medicine

## 2017-09-16 ENCOUNTER — Encounter: Payer: Self-pay | Admitting: Family Medicine

## 2017-09-16 DIAGNOSIS — J209 Acute bronchitis, unspecified: Secondary | ICD-10-CM | POA: Diagnosis not present

## 2017-09-16 MED ORDER — AZITHROMYCIN 250 MG PO TABS: ORAL_TABLET | ORAL | 0 refills | Status: DC

## 2017-09-16 MED ORDER — HYDROCODONE-HOMATROPINE 5-1.5 MG/5ML PO SYRP: 5.0000 mL | ORAL_SOLUTION | Freq: Every evening | ORAL | 0 refills | Status: DC | PRN

## 2017-09-16 NOTE — Patient Instructions (Signed)
Rest , fluids.  Complete antibiotics.  Use cough suppressant at night.

## 2017-09-16 NOTE — Assessment & Plan Note (Signed)
Given worsening after 2-3 weeks.. Suspect bacterial superinfection.  Treat with antibiotics.

## 2017-09-16 NOTE — Progress Notes (Signed)
   Subjective:    Patient ID: Allison Yoder, female    DOB: [birth year], 67 y.o.   MRN: [id number]  Cough  This is a new problem. The current episode started 1 to 4 weeks ago (2-3 weeks). The problem has been gradually worsening. The problem occurs constantly. The cough is productive of sputum. Associated symptoms include headaches, nasal congestion, shortness of breath and wheezing. Pertinent negatives include no chest pain, chills, ear pain, fever, myalgias or postnasal drip. Associated symptoms comments:  Chest tightness. The symptoms are aggravated by lying down ( cough keeping her up). Risk factors for lung disease include smoking/tobacco exposure (former smoker,  remote, 17 pyhx). Treatments tried:  alkaseltzer plus. The treatment provided mild relief. There is no history of asthma, bronchitis, COPD, emphysema, environmental allergies or pneumonia.  Sore Throat   Associated symptoms include coughing, headaches and shortness of breath. Pertinent negatives include no ear pain.  Headache   Associated symptoms include coughing. Pertinent negatives include no ear pain or fever.   Blood pressure 120/78, pulse 76, temperature 98.4 F (36.9 C), temperature source Oral, weight 183 lb (83 kg), SpO2 98 %.   Review of Systems  Constitutional: Negative for chills and fever.  HENT: Negative for ear pain and postnasal drip.   Respiratory: Positive for cough, shortness of breath and wheezing.   Cardiovascular: Negative for chest pain.  Musculoskeletal: Negative for myalgias.  Allergic/Immunologic: Negative for environmental allergies.  Neurological: Positive for headaches.       Objective:   Physical Exam  Constitutional: Vital signs are normal. She appears well-developed and well-nourished. She is cooperative.  Non-toxic appearance. She does not appear ill. No distress.  HENT:  Head: Normocephalic.  Right Ear: Hearing, tympanic membrane, external ear and ear canal normal. Tympanic membrane is  not erythematous, not retracted and not bulging.  Left Ear: Hearing, tympanic membrane, external ear and ear canal normal. Tympanic membrane is not erythematous, not retracted and not bulging.  Nose: Mucosal edema and rhinorrhea present. Right sinus exhibits no maxillary sinus tenderness and no frontal sinus tenderness. Left sinus exhibits no maxillary sinus tenderness and no frontal sinus tenderness.  Mouth/Throat: Uvula is midline, oropharynx is clear and moist and mucous membranes are normal.  Eyes: Conjunctivae, EOM and lids are normal. Pupils are equal, round, and reactive to light. Lids are everted and swept, no foreign bodies found.  Neck: Trachea normal and normal range of motion. Neck supple. Carotid bruit is not present. No thyroid mass and no thyromegaly present.  Cardiovascular: Normal rate, regular rhythm, S1 normal, S2 normal, normal heart sounds, intact distal pulses and normal pulses. Exam reveals no gallop and no friction rub.  No murmur heard. Pulmonary/Chest: Effort normal. No tachypnea. No respiratory distress. She has no decreased breath sounds. She has no wheezes. She has rhonchi. She has no rales.  Neurological: She is alert.  Skin: Skin is warm, dry and intact. No rash noted.  Psychiatric: Her speech is normal and behavior is normal. Judgment normal. Her mood appears not anxious. Cognition and memory are normal. She does not exhibit a depressed mood.          Assessment & Plan:

## 2017-09-19 ENCOUNTER — Other Ambulatory Visit: Payer: Self-pay | Admitting: Family Medicine

## 2017-10-02 DIAGNOSIS — H20021 Recurrent acute iridocyclitis, right eye: Secondary | ICD-10-CM | POA: Diagnosis not present

## 2017-10-02 LAB — HM DIABETES EYE EXAM

## 2017-10-04 ENCOUNTER — Encounter (INDEPENDENT_AMBULATORY_CARE_PROVIDER_SITE_OTHER): Payer: Medicare Other | Admitting: Ophthalmology

## 2017-10-09 ENCOUNTER — Encounter (INDEPENDENT_AMBULATORY_CARE_PROVIDER_SITE_OTHER): Payer: Medicare Other | Admitting: Ophthalmology

## 2017-10-13 DIAGNOSIS — Z1589 Genetic susceptibility to other disease: Secondary | ICD-10-CM | POA: Diagnosis not present

## 2017-10-13 DIAGNOSIS — H903 Sensorineural hearing loss, bilateral: Secondary | ICD-10-CM | POA: Diagnosis not present

## 2017-10-13 DIAGNOSIS — G4733 Obstructive sleep apnea (adult) (pediatric): Secondary | ICD-10-CM | POA: Diagnosis not present

## 2017-10-13 DIAGNOSIS — H9113 Presbycusis, bilateral: Secondary | ICD-10-CM | POA: Diagnosis not present

## 2017-10-20 ENCOUNTER — Encounter: Payer: Self-pay | Admitting: Family Medicine

## 2017-10-27 DIAGNOSIS — Z6828 Body mass index (BMI) 28.0-28.9, adult: Secondary | ICD-10-CM | POA: Diagnosis not present

## 2017-10-27 DIAGNOSIS — M542 Cervicalgia: Secondary | ICD-10-CM | POA: Diagnosis not present

## 2017-10-27 DIAGNOSIS — I10 Essential (primary) hypertension: Secondary | ICD-10-CM | POA: Diagnosis not present

## 2017-11-15 ENCOUNTER — Telehealth: Payer: Self-pay

## 2017-11-15 DIAGNOSIS — R945 Abnormal results of liver function studies: Principal | ICD-10-CM

## 2017-11-15 DIAGNOSIS — R7989 Other specified abnormal findings of blood chemistry: Secondary | ICD-10-CM

## 2017-11-15 NOTE — Telephone Encounter (Signed)
Cheri from [facility] tranferred pt to me; pt is to see Dr Johnsie Cancel on 11/20/17 and is supposed to have hepatic function panel. Pt wants to have labs drawn at [facility] and I explained to pt about new guidelines as of 11/01/17 that only [place] physicians can order lab test to be drawn at [facility] lab this includes the Heart care cardiologist. Pt said she has always had labs drawn here and wants to know if there is anything Dr Damita Dunnings can do about this. Pt request cb.

## 2017-11-16 NOTE — Telephone Encounter (Signed)
Patient made an appointment at Dr. Kyla Balzarine office for the Hepatic Panel because she did not get a call back from [place] yesterday.  Please cancel the Hepatic panel and patient says she will schedule for A1c at another time.

## 2017-11-16 NOTE — Telephone Encounter (Signed)
Cancelled. Thanks

## 2017-11-16 NOTE — Telephone Encounter (Signed)
I ordered the hepatic panel.   The protocol change wasn't our idea.   Get A1c done at the same time.   See orders.  Thanks.

## 2017-11-17 ENCOUNTER — Encounter: Payer: Self-pay | Admitting: Cardiovascular Disease

## 2017-11-17 ENCOUNTER — Other Ambulatory Visit: Payer: Medicare Other | Admitting: *Deleted

## 2017-11-17 ENCOUNTER — Other Ambulatory Visit: Payer: Self-pay

## 2017-11-17 DIAGNOSIS — E785 Hyperlipidemia, unspecified: Secondary | ICD-10-CM

## 2017-11-17 DIAGNOSIS — R740 Nonspecific elevation of levels of transaminase and lactic acid dehydrogenase [LDH]: Principal | ICD-10-CM

## 2017-11-17 DIAGNOSIS — R7401 Elevation of levels of liver transaminase levels: Secondary | ICD-10-CM

## 2017-11-17 LAB — HEPATIC FUNCTION PANEL
ALT: 33 IU/L — ABNORMAL HIGH (ref 0–32)
AST: 26 IU/L (ref 0–40)
Albumin: 4.4 g/dL (ref 3.6–4.8)
Alkaline Phosphatase: 94 IU/L (ref 39–117)
Bilirubin Total: 0.4 mg/dL (ref 0.0–1.2)
Bilirubin, Direct: 0.12 mg/dL (ref 0.00–0.40)
Total Protein: 6.7 g/dL (ref 6.0–8.5)

## 2017-11-17 LAB — LIPID PANEL
Chol/HDL Ratio: 3.4 ratio (ref 0.0–4.4)
Cholesterol, Total: 124 mg/dL (ref 100–199)
HDL: 36 mg/dL — ABNORMAL LOW (ref >39)
LDL Calculated: 65 mg/dL (ref 0–99)
Triglycerides: 114 mg/dL (ref 0–149)
VLDL Cholesterol Cal: 23 mg/dL (ref 5–40)

## 2017-11-18 NOTE — Progress Notes (Signed)
Cardiology Office Note   Date:  11/20/2017   ID:  Allison Yoder, DOB 03-02-1951, MRN [id number]  PCP:  Tonia Ghent, MD  Cardiologist:   Jenkins Rouge, MD   No chief complaint on file.     History of Present Illness:  67 y.o. chest pain 12/01/16.  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 Allergy   . Anemia    "all the time as a child"  . Anxiety   . Arthritis    "all my joints; worse in my hands" (10/27/2015)  . Chronic lower back pain   . Depression   . Diverticulosis   . Fatty liver   . GERD (gastroesophageal reflux disease)   . Heart murmur    "dx'd years ago; very mild; never treated" (10/27/2015)  . Hyperlipidemia   . Hypertension   . Hypothyroidism 2002-~ 2010  . Iritis    per [facility]  . Migraines    "stopped when I went thru menopause"  . OSA on CPAP   . Pneumonia ~ 2013; 10/27/2015  . Recurrent urinary tract infection   . Snores   . Stroke ([place])   . Type II diabetes mellitus ([place])    "lost weight; started exercising again; don't have it anymore" (10/27/2015)  . Urinary incontinence   . Wears glasses     Past Surgical History:  Procedure Laterality Date  . BLEPHAROPLASTY Bilateral 2013; 2016  . BREAST LUMPECTOMY Right 1964   benign tumor,  . HEMORRHOID BANDING  X 2  . KNEE ARTHROSCOPY Left 2016   "meniscus repair"  . PELVIC FLOOR REPAIR  2001   "lift"  . POLYPECTOMY  X 3   "bladder"  . SHOULDER ARTHROSCOPY W/ ROTATOR CUFF REPAIR Left 2013  . SHOULDER ARTHROSCOPY W/ ROTATOR CUFF REPAIR Right 2015  . TUBAL LIGATION  ~ 1986  . VAGINAL  HYSTERECTOMY  1992   Partial     Current Outpatient Medications  Medication Sig Dispense Refill  . albuterol (PROVENTIL HFA;VENTOLIN HFA) 108 (90 Base) MCG/ACT inhaler Inhale 1-2 puffs into the lungs every 6 (six) hours as needed for wheezing or shortness of breath. 1 Inhaler 1  . aspirin EC 81 MG tablet Take 81 mg by mouth daily. Two by mouth daily    . atenolol (TENORMIN) 50 MG tablet TAKE 1 TABLET BY MOUTH  DAILY 90 tablet 1  . Blood Glucose Monitoring Suppl (ONE TOUCH ULTRA 2) w/Device KIT Check blood sugar once daily and as instructed. Dx E11.9 1 each 0  . cetirizine (ZYRTEC) 10 MG tablet Take 10 mg by mouth daily as needed for allergies.     . citalopram (CELEXA) 20 MG tablet TAKE 1 TABLET BY MOUTH  DAILY 90 tablet 2  . clonazePAM (KLONOPIN) 0.5 MG tablet Take 0.5 mg by mouth daily as needed for anxiety.    . cyclobenzaprine (FLEXERIL) 10 MG tablet Take 1 tablet (10 mg total) 3 (three) times daily as needed by mouth for muscle spasms. 30 tablet 0  . dicyclomine (BENTYL) 10  MG capsule Take 1 capsule (10 mg total) by mouth 2 (two) times daily as needed for spasms. 180 capsule 3  . ezetimibe-simvastatin (VYTORIN) 10-40 MG tablet Take 1 tablet by mouth daily. 90 tablet 3  . fluticasone (FLONASE) 50 MCG/ACT nasal spray Place into both nostrils daily as needed for allergies or rhinitis.    Marland Kitchen glucose blood (ONE TOUCH ULTRA TEST) test strip Check blood sugar once daily and as instructed. Dx E11.9 100 each 1  . hydrocortisone (ANUSOL-HC) 25 MG suppository Place 1 suppository (25 mg total) daily as needed rectally. 12 suppository 1  . lisinopril (PRINIVIL,ZESTRIL) 10 MG tablet TAKE 1 TABLET BY MOUTH AT  BEDTIME 90 tablet 2  . metFORMIN (GLUCOPHAGE) 500 MG tablet TAKE 1 TABLET BY MOUTH TWO  TIMES DAILY WITH A MEAL 180 tablet 2  . Multiple Vitamin (MULTIVITAMIN) tablet Take 1 tablet by mouth daily.    Marland Kitchen NITROFURANTOIN PO Take 100 mg by mouth daily as needed.     [name] DELICA LANCETS 59D MISC  Check blood sugar once daily and as instructed. Dx E11.9 100 each 1  . phenazopyridine (PYRIDIUM) 200 MG tablet Take 200 mg by mouth daily as needed for pain.     No current facility-administered medications for this visit.     Allergies:   Cephalexin; Codeine; Inapsine [droperidol]; Lipitor [atorvastatin calcium]; Niacin and related; Pravastatin; and Adhesive [tape]    Social History:  The patient  reports that she quit smoking about 32 years ago. Her smoking use included cigarettes. She has a 10.00 pack-year smoking history. she has never used smokeless tobacco. She reports that she drinks about 1.8 oz of alcohol per week. She reports that she does not use drugs.   Family History:  The patient's family history includes Alcohol abuse in her father; Arthritis in her maternal grandmother, mother, and sister; Breast cancer in her cousin; Dementia in her paternal grandmother; Depression in her mother; Diabetes in her maternal grandmother, mother, paternal uncle, and sister; Heart disease in her mother, paternal aunt, paternal aunt, paternal uncle, and paternal uncle; Hyperlipidemia in her mother; Hypertension in her mother; Lung cancer in her father; Stroke in her maternal grandmother, mother, and paternal aunt.    ROS:  Please see the history of present illness.   Otherwise, review of systems are positive for none.   All other systems are reviewed and negative.    PHYSICAL EXAM: VS:  BP 126/70   Pulse 67   Ht 5' 6.5" (1.689 m)   Wt 183 lb 8 oz (83.2 kg)   BMI 29.17 kg/m  , BMI Body mass index is 29.17 kg/m. Affect appropriate Healthy:  appears stated age 59: normal Neck supple with no adenopathy JVP normal no bruits no thyromegaly Lungs clear with no wheezing and good diaphragmatic motion Heart:  S1/S2 no murmur, no rub, gallop or click PMI normal Abdomen: benighn, BS positve, no tenderness, no AAA no bruit.  No HSM or HJR Distal pulses intact with no bruits No edema Neuro  non-focal Skin warm and dry No muscular weakness     EKG:  NSR normal ECG  11/20/17    Recent Labs: 08/15/2017: BUN 16; Creatinine, Ser 0.75; Hemoglobin 14.1; Platelets 211.0; Potassium 4.5; Sodium 137 11/17/2017: ALT 33    Lipid Panel    Component Value Date/Time   CHOL 124 11/17/2017 0804   TRIG 114 11/17/2017 0804   HDL 36 (L) 11/17/2017 0804   CHOLHDL 3.4 11/17/2017 0804   CHOLHDL 4  08/15/2017 0743   VLDL 40.4 (H) 08/15/2017 0743   LDLCALC 65 11/17/2017 0804   LDLDIRECT 66.0 08/15/2017 0743      Wt Readings from Last 3 Encounters:  11/20/17 183 lb 8 oz (83.2 kg)  09/16/17 183 lb (83 kg)  08/16/17 185 lb (83.9 kg)      Other studies Reviewed: Additional studies/ records that were reviewed today include: CXR, ECG labs and records ER visit 12/01/16.    ASSESSMENT AND PLAN:  1.  Chest Pain:  Normal ETT 12/14/16 Calcium Score 172 86 th percentile 12/16/16 Calcium was noted In proximal RCA and LAD  2. Chol:  Continue zetia and statin LDL 65 11/17/17  3. HTN:  Well controlled.  Continue current medications and low sodium Dash type diet.   4. DM:  Discussed low carb diet.  Target hemoglobin A1c is 6.5 or less.  Continue current medications. 5. Depression on celexa f/u Dr Damita Dunnings  6. Stroke: lacunar on ASA carotids with no high grade lesion f/u neuro    Current medicines are reviewed at length with the patient today.  The patient does not have concerns regarding medicines.  The following changes have been made:  no change  Labs/ tests ordered today include:  None   Orders Placed This Encounter  Procedures  . Hepatic function panel  . Lipid panel  . EKG 12-Lead     Disposition:   FU with me in a year      Signed, Jenkins Rouge, MD  11/20/2017 10:16 AM    [address], [place], [place]  [address] Phone: [phone]; Fax: [phone]

## 2017-11-20 ENCOUNTER — Encounter: Payer: Self-pay | Admitting: Cardiovascular Disease

## 2017-11-20 ENCOUNTER — Ambulatory Visit (INDEPENDENT_AMBULATORY_CARE_PROVIDER_SITE_OTHER): Payer: Medicare Other | Admitting: Cardiovascular Disease

## 2017-11-20 VITALS — BP 126/70 | HR 67 | Ht 66.5 in | Wt 183.5 lb

## 2017-11-20 DIAGNOSIS — R0789 Other chest pain: Secondary | ICD-10-CM

## 2017-11-20 DIAGNOSIS — E782 Mixed hyperlipidemia: Secondary | ICD-10-CM | POA: Diagnosis not present

## 2017-11-20 DIAGNOSIS — I10 Essential (primary) hypertension: Secondary | ICD-10-CM | POA: Diagnosis not present

## 2017-11-20 NOTE — Patient Instructions (Addendum)
Medication Instructions:  Your physician recommends that you continue on your current medications as directed. Please refer to the Current Medication list given to you today.  Labwork: Your physician recommends that you return for lab work in: 6 months for fasting lipid and liver panel.    Testing/Procedures: NONE  Follow-Up: Your physician wants you to follow-up in: 12 months with Dr. Johnsie Cancel. You will receive a reminder letter in the mail two months in advance. If you don't receive a letter, please call our office to schedule the follow-up appointment.   If you need a refill on your cardiac medications before your next appointment, please call your pharmacy.

## 2017-11-28 ENCOUNTER — Telehealth: Payer: Self-pay | Admitting: Family Medicine

## 2017-11-28 NOTE — Telephone Encounter (Signed)
Copied from [place]. Topic: Quick Communication - See Telephone Encounter >> Nov 28, 2017 10:59 AM Ahmed Prima L wrote: CRM for notification. See Telephone encounter for:   11/28/17.  Pt said that Dr Damita Dunnings wanted her to come in for a A1C check. No orders in. Please advise & call patient to sch [phone]

## 2017-11-28 NOTE — Telephone Encounter (Signed)
See below crm   Is it ok to schedule?

## 2017-11-29 NOTE — Telephone Encounter (Signed)
Order for A1c already in EMR.  Lab prior to OV when possible.  Thanks.

## 2017-11-30 NOTE — Telephone Encounter (Signed)
appointment 3/22  Pt aware

## 2017-12-01 ENCOUNTER — Other Ambulatory Visit: Payer: Medicare Other

## 2017-12-07 ENCOUNTER — Other Ambulatory Visit: Payer: Self-pay | Admitting: Family Medicine

## 2017-12-07 DIAGNOSIS — E119 Type 2 diabetes mellitus without complications: Secondary | ICD-10-CM

## 2017-12-08 ENCOUNTER — Ambulatory Visit: Payer: Medicare Other

## 2017-12-08 ENCOUNTER — Other Ambulatory Visit: Payer: Medicare Other

## 2017-12-31 ENCOUNTER — Other Ambulatory Visit: Payer: Self-pay | Admitting: Family Medicine

## 2017-12-31 ENCOUNTER — Other Ambulatory Visit: Payer: Self-pay | Admitting: Cardiovascular Disease

## 2018-03-02 ENCOUNTER — Ambulatory Visit: Payer: Medicare Other

## 2018-03-13 ENCOUNTER — Other Ambulatory Visit: Payer: Self-pay | Admitting: Family Medicine

## 2018-03-27 DIAGNOSIS — H109 Unspecified conjunctivitis: Secondary | ICD-10-CM | POA: Diagnosis not present

## 2018-04-04 ENCOUNTER — Other Ambulatory Visit: Payer: Medicare Other

## 2018-04-10 ENCOUNTER — Other Ambulatory Visit (INDEPENDENT_AMBULATORY_CARE_PROVIDER_SITE_OTHER): Payer: Medicare Other

## 2018-04-10 DIAGNOSIS — E119 Type 2 diabetes mellitus without complications: Secondary | ICD-10-CM | POA: Diagnosis not present

## 2018-04-10 LAB — TSH: TSH: 6.86 u[IU]/mL — ABNORMAL HIGH (ref 0.35–4.50)

## 2018-04-10 LAB — BASIC METABOLIC PANEL
BUN: 14 mg/dL (ref 6–23)
CO2: 30 mEq/L (ref 19–32)
Calcium: 9.6 mg/dL (ref 8.4–10.5)
Chloride: 104 mEq/L (ref 96–112)
Creatinine, Ser: 0.76 mg/dL (ref 0.40–1.20)
GFR: 80.5 mL/min (ref >60.00)
Glucose, Bld: 198 mg/dL — ABNORMAL HIGH (ref 70–99)
Potassium: 4.4 mEq/L (ref 3.5–5.1)
Sodium: 140 mEq/L (ref 135–145)

## 2018-04-10 LAB — HEMOGLOBIN A1C: Hgb A1c MFr Bld: 7.6 % — ABNORMAL HIGH (ref 4.6–6.5)

## 2018-04-13 ENCOUNTER — Encounter: Payer: Medicare Other | Admitting: Family Medicine

## 2018-04-14 ENCOUNTER — Telehealth: Payer: Self-pay | Admitting: Family Medicine

## 2018-04-14 DIAGNOSIS — E039 Hypothyroidism, unspecified: Secondary | ICD-10-CM

## 2018-04-14 NOTE — Telephone Encounter (Signed)
Notify patient.  I see that she reschedule her visit.  This is fine.  The only issue was her TSH being slightly elevated.  I wanted to recheck it in about 2 months.  She is coming in toward the end of September and I made a note to recheck it at the visit.  She does not need to do labs ahead of time.  She does not need to fast.  We will talk about her labs in detail when she comes in.  Thanks.

## 2018-04-16 NOTE — Telephone Encounter (Signed)
Patient notified as instructed by telephone and verbalized understanding. 

## 2018-04-30 ENCOUNTER — Ambulatory Visit: Payer: Medicare Other | Admitting: Family Medicine

## 2018-04-30 DIAGNOSIS — Z0289 Encounter for other administrative examinations: Secondary | ICD-10-CM

## 2018-05-08 ENCOUNTER — Other Ambulatory Visit: Payer: Self-pay | Admitting: Family Medicine

## 2018-05-22 ENCOUNTER — Other Ambulatory Visit: Payer: Medicare Other

## 2018-06-01 ENCOUNTER — Other Ambulatory Visit: Payer: Self-pay | Admitting: Family Medicine

## 2018-06-08 ENCOUNTER — Ambulatory Visit (INDEPENDENT_AMBULATORY_CARE_PROVIDER_SITE_OTHER): Payer: Medicare Other | Admitting: Family Medicine

## 2018-06-08 ENCOUNTER — Encounter: Payer: Self-pay | Admitting: Family Medicine

## 2018-06-08 VITALS — BP 134/76 | HR 69 | Temp 98.6°F | Ht 66.5 in | Wt 177.5 lb

## 2018-06-08 DIAGNOSIS — Z8719 Personal history of other diseases of the digestive system: Secondary | ICD-10-CM

## 2018-06-08 DIAGNOSIS — K760 Fatty (change of) liver, not elsewhere classified: Secondary | ICD-10-CM

## 2018-06-08 DIAGNOSIS — E039 Hypothyroidism, unspecified: Secondary | ICD-10-CM

## 2018-06-08 DIAGNOSIS — I10 Essential (primary) hypertension: Secondary | ICD-10-CM | POA: Diagnosis not present

## 2018-06-08 DIAGNOSIS — R0789 Other chest pain: Secondary | ICD-10-CM | POA: Diagnosis not present

## 2018-06-08 DIAGNOSIS — Z0000 Encounter for general adult medical examination without abnormal findings: Secondary | ICD-10-CM | POA: Diagnosis not present

## 2018-06-08 DIAGNOSIS — E782 Mixed hyperlipidemia: Secondary | ICD-10-CM | POA: Diagnosis not present

## 2018-06-08 DIAGNOSIS — I63332 Cerebral infarction due to thrombosis of left posterior cerebral artery: Secondary | ICD-10-CM | POA: Diagnosis not present

## 2018-06-08 DIAGNOSIS — Z7189 Other specified counseling: Secondary | ICD-10-CM

## 2018-06-08 DIAGNOSIS — E119 Type 2 diabetes mellitus without complications: Secondary | ICD-10-CM | POA: Diagnosis not present

## 2018-06-08 DIAGNOSIS — K5792 Diverticulitis of intestine, part unspecified, without perforation or abscess without bleeding: Secondary | ICD-10-CM

## 2018-06-08 LAB — TSH: TSH: 2.28 u[IU]/mL (ref 0.35–4.50)

## 2018-06-08 LAB — LIPID PANEL
Cholesterol: 89 mg/dL (ref 0–200)
HDL: 30.8 mg/dL — ABNORMAL LOW (ref >39.00)
LDL Cholesterol: 38 mg/dL (ref 0–99)
NonHDL: 57.77
Total CHOL/HDL Ratio: 3
Triglycerides: 98 mg/dL (ref 0.0–149.0)
VLDL: 19.6 mg/dL (ref 0.0–40.0)

## 2018-06-08 LAB — HEPATIC FUNCTION PANEL
ALT: 36 U/L — ABNORMAL HIGH (ref 0–35)
AST: 30 U/L (ref 0–37)
Albumin: 4.5 g/dL (ref 3.5–5.2)
Alkaline Phosphatase: 81 U/L (ref 39–117)
Bilirubin, Direct: 0.2 mg/dL (ref 0.0–0.3)
Total Bilirubin: 0.6 mg/dL (ref 0.2–1.2)
Total Protein: 7.1 g/dL (ref 6.0–8.3)

## 2018-06-08 MED ORDER — AMOXICILLIN-POT CLAVULANATE 875-125 MG PO TABS: 1.0000 | ORAL_TABLET | Freq: Two times a day (BID) | ORAL | 0 refills | Status: DC

## 2018-06-08 NOTE — Addendum Note (Signed)
Addended by: Lendon Collar on: 06/08/2018 01:26 PM   Modules accepted: Orders

## 2018-06-08 NOTE — Patient Instructions (Addendum)
Get a flu shot at work.  Call GYN about follow up.  Use the augmentin if needed but update [place] at that point.  If you can find a cheaper meter/strip that is covered, then let us know.  Go to the lab on the way out.  We'll contact you with your lab report.  Plan on recheck here in about 2-3 months.  The only lab you need to have done for your next diabetic visit is an A1c.  We can do this with a fingerstick test at the office visit.  You do not need a lab visit ahead of time for this.  It does not matter if you are fasting when the lab is done.    Take care.  Glad to see you.

## 2018-06-08 NOTE — Progress Notes (Signed)
I have personally reviewed the Medicare Annual Wellness questionnaire and have noted 1. The patient's medical and social history 2. Their use of alcohol, tobacco or illicit drugs 3. Their current medications and supplements 4. The patient's functional ability including ADL's, fall risks, home safety risks and hearing or visual             impairment. 5. Diet and physical activities 6. Evidence for depression or mood disorders  The patients weight, height, BMI have been recorded in the chart and visual acuity is per eye clinic.  I have made referrals, counseling and provided education to the patient based review of the above and I have provided the pt with a written personalized care plan for preventive services.  Provider list updated- see scanned forms.  Routine anticipatory guidance given to patient.  See health maintenance. The possibility exists that previously documented standard health maintenance information may have been brought forward from a previous encounter into this note.  If needed, that same information has been updated to reflect the current situation based on today's encounter.    Flu to be done next week.  Discussed with patient. Shingles 2013 PNA up-to-date Tetanus 2013 Colonoscopy 2016 Breast cancer screening-pending per gynecology clinic.  She will call about follow-up on that. Bone density testing pending per gynecology clinic.  She will call about follow-up on that.  Discussed. Advance directive-sister Debbie to be designated if patient were incapacitated. Cognitive function addressed- see scanned forms- and if abnormal then additional documentation follows.   Diabetes:  Using medications without difficulties: yes Hypoglycemic episodes: only if prolonged fasting.   Hyperglycemic episodes: no Feet problems: some occ burning at night, not in the day.   Blood Sugars averaging: not checked often.   eye exam within last year: yes A1c not due today.   Elevated  Cholesterol: Using medications without problems:yes Muscle aches: no Diet compliance: encouraged.   Exercise: encouraged Prev labs d/w pt.   Hypertension:    Using medication without problems or lightheadedness: yes Chest pain with exertion:no pain currently but she has some exertional chest tightness over the last year.  Edema: occ R ankle edema attributed to OA.   Short of breath:she attributed some of that to relative deconditioning.    She has h/o diverticulosis/itis.  She had LLQ abd pain and diarrhea.  She took a course of augmentin with relief of sx.  She has had episodic flares over the last few years.  She has seen Dr. Watt Climes with GI.  We talked about options.  D/w pt about diet.  She had itching with keflex but can tolerate augmentin.    Prev CT with: 1. Extensive inflammatory change associated with diverticula in the sigmoid colon compatible with acute diverticulitis. 2. No definite free air rupture. 3. Additional diverticular within the descending colon without associated inflammatory change. 4. Extensive hepatic steatosis without a discrete lesion. 5. Bilateral renal cysts.  She has been looking after her grandson at home- he is 38 months old.  She is still working.  She has a lot going on.  She is happy to be able to help but it is a big commitment.    PMH and SH reviewed  Meds, vitals, and allergies reviewed.   ROS: Per HPI.  Unless specifically indicated otherwise in HPI, the patient denies:  General: fever. Eyes: acute vision changes ENT: sore throat Cardiovascular: chest pain Respiratory: SOB GI: vomiting GU: dysuria Musculoskeletal: acute back pain Derm: acute rash Neuro: acute motor dysfunction Psych:  worsening mood Endocrine: polydipsia Heme: bleeding Allergy: hayfever  GEN: nad, alert and oriented HEENT: mucous membranes moist NECK: supple w/o LA CV: rrr. PULM: ctab, no inc wob ABD: soft, +bs EXT: no edema SKIN: no acute rash  Diabetic foot  exam: Normal inspection No skin breakdown No calluses  Normal DP pulses Normal sensation to light touch and monofilament Nails normal

## 2018-06-10 ENCOUNTER — Encounter: Payer: Self-pay | Admitting: Family Medicine

## 2018-06-10 DIAGNOSIS — R0789 Other chest pain: Secondary | ICD-10-CM | POA: Insufficient documentation

## 2018-06-10 DIAGNOSIS — Z0000 Encounter for general adult medical examination without abnormal findings: Secondary | ICD-10-CM | POA: Insufficient documentation

## 2018-06-10 NOTE — Assessment & Plan Note (Signed)
History of.  No symptoms now.  If she has return of significant left lower quadrant pain then I want her start a clear liquid diet, start Augmentin, and seek evaluation.  Routine cautions given.  She agrees.  Okay for outpatient follow-up

## 2018-06-10 NOTE — Assessment & Plan Note (Signed)
No new symptoms.  Continue risk factor reduction with current medications and lifestyle interventions.

## 2018-06-10 NOTE — Assessment & Plan Note (Signed)
No change in meds.  Continue work on diet and exercise.  Labs discussed with patient. 

## 2018-06-10 NOTE — Assessment & Plan Note (Signed)
Known.  Discussed diet and exercise.

## 2018-06-10 NOTE — Assessment & Plan Note (Signed)
Flu to be done next week.  Discussed with patient. Shingles 2013 PNA up-to-date Tetanus 2013 Colonoscopy 2016 Breast cancer screening-pending per gynecology clinic.  She will call about follow-up on that. Bone density testing pending per gynecology clinic.  She will call about follow-up on that.  Discussed. Advance directive-sister Debbie to be designated if patient were incapacitated. Cognitive function addressed- see scanned forms- and if abnormal then additional documentation follows.

## 2018-06-10 NOTE — Assessment & Plan Note (Signed)
Advance directive-sister Debbie to be designated if patient were incapacitated.

## 2018-06-10 NOTE — Assessment & Plan Note (Signed)
She had some episodic exertional chest tightness over the past year.  Discussed with patient.  I will cardiology input on this.  Routine cautions given.

## 2018-06-10 NOTE — Assessment & Plan Note (Signed)
A1c not due for recheck today.  Discussed with patient about diet and exercise.  No change in meds at this point.  She agrees.

## 2018-06-10 NOTE — Assessment & Plan Note (Signed)
History of abnormal TSH.  Recheck today.  See notes on labs.

## 2018-06-11 ENCOUNTER — Telehealth: Payer: Self-pay

## 2018-06-11 DIAGNOSIS — R0789 Other chest pain: Secondary | ICD-10-CM

## 2018-06-11 NOTE — Telephone Encounter (Signed)
-----   Message from Josue Hector, MD sent at 06/11/2018  8:31 AM EDT ----- Have patient get lexiscan myovue and f/u with me or PA

## 2018-06-11 NOTE — Telephone Encounter (Signed)
Called patient about having lexiscan done and having f/u with a PA. Informed patient of instructions of lexiscan. Will place order and have [facility] call patient to schedule. Patient verbalized understanding.

## 2018-06-14 ENCOUNTER — Telehealth (HOSPITAL_COMMUNITY): Payer: Self-pay | Admitting: *Deleted

## 2018-06-14 NOTE — Telephone Encounter (Signed)
Patient given detailed instructions per Myocardial Perfusion Study Information Sheet for the test on 06/19/18. Patient notified to arrive 15 minutes early and that it is imperative to arrive on time for appointment to keep from having the test rescheduled.  If you need to cancel or reschedule your appointment, please call the office within 24 hours of your appointment. . Patient verbalized understanding. Kirstie Peri

## 2018-06-19 ENCOUNTER — Ambulatory Visit (HOSPITAL_COMMUNITY): Payer: Medicare Other | Attending: Cardiovascular Disease

## 2018-06-19 VITALS — Ht 66.5 in | Wt 183.0 lb

## 2018-06-19 DIAGNOSIS — R0789 Other chest pain: Secondary | ICD-10-CM | POA: Diagnosis not present

## 2018-06-19 MED ORDER — TECHNETIUM TC 99M TETROFOSMIN IV KIT
32.0000 | PACK | Freq: Once | INTRAVENOUS | Status: AC | PRN
  Administered 2018-06-19: 32 via INTRAVENOUS
  Filled 2018-06-19: qty 32

## 2018-06-19 MED ORDER — REGADENOSON 0.4 MG/5ML IV SOLN
0.4000 mg | Freq: Once | INTRAVENOUS | Status: AC
  Administered 2018-06-19: 0.4 mg via INTRAVENOUS

## 2018-06-21 ENCOUNTER — Encounter: Payer: Self-pay | Admitting: Family Medicine

## 2018-06-21 ENCOUNTER — Ambulatory Visit (INDEPENDENT_AMBULATORY_CARE_PROVIDER_SITE_OTHER): Payer: Medicare Other | Admitting: Family Medicine

## 2018-06-21 VITALS — BP 110/70 | HR 75 | Temp 98.5°F | Ht 66.5 in | Wt 179.5 lb

## 2018-06-21 DIAGNOSIS — R300 Dysuria: Secondary | ICD-10-CM | POA: Diagnosis not present

## 2018-06-21 DIAGNOSIS — I63332 Cerebral infarction due to thrombosis of left posterior cerebral artery: Secondary | ICD-10-CM

## 2018-06-21 LAB — POC URINALSYSI DIPSTICK (AUTOMATED)
Blood, UA: NEGATIVE
Glucose, UA: NEGATIVE
Ketones, UA: NEGATIVE
Nitrite, UA: POSITIVE
Protein, UA: NEGATIVE
Spec Grav, UA: 1.025 (ref 1.010–1.025)
Urobilinogen, UA: 4 E.U./dL — AB
pH, UA: 6 (ref 5.0–8.0)

## 2018-06-21 MED ORDER — SULFAMETHOXAZOLE-TRIMETHOPRIM 800-160 MG PO TABS: 1.0000 | ORAL_TABLET | Freq: Two times a day (BID) | ORAL | 0 refills | Status: DC

## 2018-06-21 NOTE — Progress Notes (Signed)
Dr. Frederico Hamman T. Sameria Morss, MD, [facility] [place], [address] Phone: [phone] Fax: [phone]  06/21/2018  Patient: Allison Yoder, MRN: [id number], DOB: [birth year], 67 y.o.  Primary Physician:  Tonia Ghent, MD   Chief Complaint  Patient presents with  . Dysuria   Subjective:   This 67 y.o. female patient presents with burning, urgency. No vaginal discharge or external irritation.  No STD exposure. No abd pain, no flank pain.  Mild hypogastric pain yest  Taking azo  The PMH, PSH, Social History, Family History, Medications, and allergies have been reviewed in [facility], and have been updated if relevant.  Patient Active Problem List   Diagnosis Date Noted  . Medicare annual wellness visit, initial 06/10/2018  . Other chest pain 06/10/2018  . Osteopenia 09/10/2017  . Fatty liver 05/19/2017  . Diabetes mellitus ([place]) 05/05/2017  . Cerebrovascular accident (CVA) due to thrombosis of left posterior cerebral artery ([place]) 04/08/2016  . Hypertensive disorder 10/27/2015  . Advance care planning 04/29/2015  . Obstructive sleep apnea of adult 07/25/2014  . Palpitations 01/06/2013  . Anxiety 09/01/2012  . MDD (major depressive disorder) 12/19/2011  . OA (osteoarthritis) 12/19/2011  . Hyperlipidemia 12/19/2011  . Diverticulitis 12/19/2011  . Migraines 12/19/2011  . Hypothyroid 12/19/2011    Past Medical History:  Diagnosis Date  . Allergy   . Anemia    "all the time as a child"  . Anxiety   . Arthritis    "all my joints; worse in my hands" (10/27/2015)  . Chronic lower back pain   . Depression   . Diverticulosis   . Fatty liver   . GERD (gastroesophageal reflux disease)   . Heart murmur    "dx'd years ago; very mild; never treated" (10/27/2015)  . Hyperlipidemia   . Hypertension   . Hypothyroidism 2002-~ 2010  . Iritis    per [facility]  . Migraines    "stopped when I went thru  menopause"  . OSA on CPAP   . Pneumonia ~ 2013; 10/27/2015  . Recurrent urinary tract infection   . Routine general medical examination at a health care facility 02/26/2014  . Snores   . Stroke ([place])   . Type II diabetes mellitus ([place])    "lost weight; started exercising again; don't have it anymore" (10/27/2015)  . Urinary incontinence   . Wears glasses     Past Surgical History:  Procedure Laterality Date  . BLEPHAROPLASTY Bilateral 2013; 2016  . BREAST LUMPECTOMY Right 1964   benign tumor,  . HEMORRHOID BANDING  X 2  . KNEE ARTHROSCOPY Left 2016   "meniscus repair"  . PELVIC FLOOR REPAIR  2001   "lift"  . POLYPECTOMY  X 3   "bladder"  . SHOULDER ARTHROSCOPY W/ ROTATOR CUFF REPAIR Left 2013  . SHOULDER ARTHROSCOPY W/ ROTATOR CUFF REPAIR Right 2015  . TUBAL LIGATION  ~ 1986  . VAGINAL HYSTERECTOMY  1992   Partial    Social History   Socioeconomic History  . Marital status: Divorced    Spouse name: Not on file  . Number of children: 1  . Years of education: Not on file  . Highest education level: Not on file  Occupational History  . Occupation: Dental Receptionist/Assistant    Comment: Dr. Quillian Quince in [place]: [employer]  . Financial resource strain: Not on file  . Food insecurity:  Worry: Not on file    Inability: Not on file  . Transportation needs:    Medical: Not on file    Non-medical: Not on file  Tobacco Use  . Smoking status: Former Smoker    Packs/day: 2.00    Years: 5.00    Pack years: 10.00    Types: Cigarettes    Last attempt to quit: 09/12/1985    Years since quitting: 32.7  . Smokeless tobacco: Never Used  Substance and Sexual Activity  . Alcohol use: Yes    Alcohol/week: 3.0 standard drinks    Types: 1 Glasses of wine, 2 Shots of liquor per week    Comment: weekends  . Drug use: No  . Sexual activity: Yes  Lifestyle  . Physical activity:    Days per week: Not on file    Minutes per session: Not on  file  . Stress: Not on file  Relationships  . Social connections:    Talks on phone: Not on file    Gets together: Not on file    Attends religious service: Not on file    Active member of club or organization: Not on file    Attends meetings of clubs or organizations: Not on file    Relationship status: Not on file  . Intimate partner violence:    Fear of current or ex partner: Not on file    Emotionally abused: Not on file    Physically abused: Not on file    Forced sexual activity: Not on file  Other Topics Concern  . Not on file  Social History Narrative   Education:  BA   Dental assistant   Divorced and lives with her brother.      Family History  Problem Relation Age of Onset  . Arthritis Mother   . Heart disease Mother   . Hyperlipidemia Mother   . Hypertension Mother   . Diabetes Mother   . Depression Mother   . Stroke Mother   . Alcohol abuse Father   . Lung cancer Father   . Arthritis Maternal Grandmother   . Stroke Maternal Grandmother   . Diabetes Maternal Grandmother   . Heart disease Paternal Uncle        x 2  . Arthritis Sister   . Breast cancer Cousin   . Heart disease Paternal Aunt   . Diabetes Paternal Uncle   . Diabetes Sister   . Heart disease Paternal Uncle        x 5  . Heart disease Paternal Aunt        x 3  . Stroke Paternal Aunt   . Dementia Paternal Grandmother   . Colon cancer Neg Hx     Allergies  Allergen Reactions  . Cephalexin Itching    Does tolerate augmentin  . Codeine Itching  . Inapsine [Droperidol] Shortness Of Breath, Anxiety and Hypertension    Elevated HR and BP, panic attack  . Lipitor [Atorvastatin Calcium]     Myalgias   . Niacin And Related     Flushing  . Pravastatin     Myalgias  . Adhesive [Tape] Rash    Blisters with tape    Medication list reviewed and updated in full in  Link.  GEN:  no fevers, chills. GI: No n/v/d, eating normally Otherwise, ROS is as per the HPI.  Objective:    Blood pressure 110/70, pulse 75, temperature 98.5 F (36.9 C), temperature source Oral, height 5' 6.5" (1.689 m),   weight 179 lb 8 oz (81.4 kg).  GEN: WDWN, A&Ox4,NAD. Non-toxic HEENT: Atraumatc, normocephalic. CV: RRR, No M/G/R PULM: CTA B, No wheezes, crackles, or rhonchi ABD: S, NT, ND, +BS, no rebound. No CVAT. No suprapubic tenderness. EXT: No c/c/e  Objective Data: Results for orders placed or performed in visit on 06/21/18  POCT Urinalysis Dipstick (Automated)  Result Value Ref Range   Color, UA Orange    Clarity, UA Hazy    Glucose, UA Negative Negative   Bilirubin, UA 1+    Ketones, UA Negative    Spec Grav, UA 1.025 1.010 - 1.025   Blood, UA Negative    pH, UA 6.0 5.0 - 8.0   Protein, UA Negative Negative   Urobilinogen, UA 4.0 (A) 0.2 or 1.0 E.U./dL   Nitrite, UA Positive    Leukocytes, UA Moderate (2+) (A) Negative     Assessment and Plan:   Dysuria - Plan: POCT Urinalysis Dipstick (Automated), Urine Culture  Rx with ABX as below. Drink plenty of fluids and supportive care.  Follow-up: No follow-ups on file.  Orders Placed This Encounter  Procedures  . Urine Culture  . POCT Urinalysis Dipstick (Automated)    Signed,  Spencer T. Copland, MD   Patient's Medications  New Prescriptions   No medications on file  Previous Medications   ALBUTEROL (PROVENTIL HFA;VENTOLIN HFA) 108 (90 BASE) MCG/ACT INHALER    Inhale 1-2 puffs into the lungs every 6 (six) hours as needed for wheezing or shortness of breath.   ASPIRIN EC 81 MG TABLET    Take 81 mg by mouth daily. Two by mouth daily   ATENOLOL (TENORMIN) 50 MG TABLET    TAKE 1 TABLET BY MOUTH  DAILY   BLOOD GLUCOSE MONITORING SUPPL (ONE TOUCH ULTRA 2) W/DEVICE KIT    Check blood sugar once daily and as instructed. Dx E11.9   CETIRIZINE (ZYRTEC) 10 MG TABLET    Take 10 mg by mouth daily as needed for allergies.    CITALOPRAM (CELEXA) 20 MG TABLET    TAKE 1 TABLET BY MOUTH  DAILY   CLONAZEPAM (KLONOPIN) 0.5  MG TABLET    Take 0.5 mg by mouth daily as needed for anxiety.   CYANOCOBALAMIN 1000 MCG TABLET    Take 1,000 mcg by mouth daily.   CYCLOBENZAPRINE (FLEXERIL) 10 MG TABLET    Take 1 tablet (10 mg total) 3 (three) times daily as needed by mouth for muscle spasms.   DICYCLOMINE (BENTYL) 10 MG CAPSULE    Take 1 capsule (10 mg total) by mouth 2 (two) times daily as needed for spasms.   EZETIMIBE-SIMVASTATIN (VYTORIN) 10-40 MG TABLET    TAKE 1 TABLET BY MOUTH  DAILY   FLUTICASONE (FLONASE) 50 MCG/ACT NASAL SPRAY    Place into both nostrils daily as needed for allergies or rhinitis.   GLUCOSE BLOOD (ONE TOUCH ULTRA TEST) TEST STRIP    Check blood sugar once daily and as instructed. Dx E11.9   LISINOPRIL (PRINIVIL,ZESTRIL) 10 MG TABLET    TAKE 1 TABLET BY MOUTH AT  BEDTIME   METFORMIN (GLUCOPHAGE) 500 MG TABLET    Take 1,000 mg by mouth 2 (two) times daily with a meal.   MULTIPLE VITAMIN (MULTIVITAMIN) TABLET    Take 1 tablet by mouth daily.   NITROFURANTOIN PO    Take 100 mg by mouth daily as needed.    ONETOUCH DELICA LANCETS 33G MISC    Check blood sugar once daily and as instructed. Dx   E11.9   PHENAZOPYRIDINE (PYRIDIUM) 200 MG TABLET    Take 200 mg by mouth daily as needed for pain.   VITAMIN D, CHOLECALCIFEROL, 1000 UNITS CAPS    Take 3,000 Units by mouth daily.  Modified Medications   No medications on file  Discontinued Medications   AMOXICILLIN-CLAVULANATE (AUGMENTIN) 875-125 MG TABLET    Take 1 tablet by mouth 2 (two) times daily.    

## 2018-06-22 ENCOUNTER — Ambulatory Visit (HOSPITAL_COMMUNITY): Payer: Medicare Other | Attending: Internal Medicine

## 2018-06-22 LAB — MYOCARDIAL PERFUSION IMAGING
LV dias vol: 49 mL (ref 46–106)
LV sys vol: 14 mL
Peak HR: 106 {beats}/min
Rest HR: 71 {beats}/min
SDS: 0
SRS: 0
SSS: 0
TID: 0.93

## 2018-06-22 MED ORDER — TECHNETIUM TC 99M TETROFOSMIN IV KIT
32.7000 | PACK | Freq: Once | INTRAVENOUS | Status: AC | PRN
  Administered 2018-06-22: 32.7 via INTRAVENOUS
  Filled 2018-06-22: qty 33

## 2018-06-23 ENCOUNTER — Other Ambulatory Visit: Payer: Self-pay | Admitting: Family Medicine

## 2018-06-23 LAB — URINE CULTURE
MICRO NUMBER:: 91220345
SPECIMEN QUALITY:: ADEQUATE

## 2018-06-23 MED ORDER — AMOXICILLIN-POT CLAVULANATE 875-125 MG PO TABS: 1.0000 | ORAL_TABLET | Freq: Two times a day (BID) | ORAL | 0 refills | Status: AC

## 2018-06-23 NOTE — Progress Notes (Signed)
Change abx due to resistant UTI

## 2018-07-06 ENCOUNTER — Ambulatory Visit: Payer: Medicare Other | Admitting: Cardiology

## 2018-08-01 ENCOUNTER — Other Ambulatory Visit: Payer: Self-pay | Admitting: Family Medicine

## 2018-08-01 DIAGNOSIS — Z23 Encounter for immunization: Secondary | ICD-10-CM | POA: Diagnosis not present

## 2018-08-25 ENCOUNTER — Other Ambulatory Visit: Payer: Self-pay | Admitting: Family Medicine

## 2018-09-21 ENCOUNTER — Other Ambulatory Visit: Payer: Medicare Other

## 2018-09-26 ENCOUNTER — Telehealth: Payer: Self-pay | Admitting: Cardiovascular Disease

## 2018-09-26 DIAGNOSIS — E782 Mixed hyperlipidemia: Secondary | ICD-10-CM

## 2018-09-26 NOTE — Telephone Encounter (Signed)
New Message:     Pt has an appointment with Dr Johnsie Cancel on 11-30-18. She would like for you to order her lab work the week before please.Marland Kitchen

## 2018-09-27 DIAGNOSIS — K5732 Diverticulitis of large intestine without perforation or abscess without bleeding: Secondary | ICD-10-CM | POA: Diagnosis not present

## 2018-09-27 DIAGNOSIS — R109 Unspecified abdominal pain: Secondary | ICD-10-CM | POA: Diagnosis not present

## 2018-09-27 NOTE — Telephone Encounter (Signed)
Patient had lab work in September and needs repeat lab work in 6 months, per Dr. Johnsie Cancel. Patient has appt for lab work in March.

## 2018-10-26 ENCOUNTER — Ambulatory Visit: Payer: Medicare Other | Admitting: Family Medicine

## 2018-10-26 DIAGNOSIS — Z0289 Encounter for other administrative examinations: Secondary | ICD-10-CM

## 2018-11-19 ENCOUNTER — Encounter: Payer: Self-pay | Admitting: Family Medicine

## 2018-11-19 ENCOUNTER — Ambulatory Visit (INDEPENDENT_AMBULATORY_CARE_PROVIDER_SITE_OTHER): Payer: Medicare Other | Admitting: Family Medicine

## 2018-11-19 VITALS — BP 118/68 | HR 78 | Temp 98.6°F | Ht 66.5 in | Wt 171.6 lb

## 2018-11-19 DIAGNOSIS — E119 Type 2 diabetes mellitus without complications: Secondary | ICD-10-CM | POA: Diagnosis not present

## 2018-11-19 DIAGNOSIS — J069 Acute upper respiratory infection, unspecified: Secondary | ICD-10-CM

## 2018-11-19 DIAGNOSIS — S39012A Strain of muscle, fascia and tendon of lower back, initial encounter: Secondary | ICD-10-CM | POA: Diagnosis not present

## 2018-11-19 DIAGNOSIS — E782 Mixed hyperlipidemia: Secondary | ICD-10-CM | POA: Diagnosis not present

## 2018-11-19 LAB — POCT GLYCOSYLATED HEMOGLOBIN (HGB A1C): Hemoglobin A1C: 7.3 % — AB (ref 4.0–5.6)

## 2018-11-19 MED ORDER — EZETIMIBE 10 MG PO TABS: 10.0000 mg | ORAL_TABLET | Freq: Every day | ORAL | 3 refills | Status: DC

## 2018-11-19 MED ORDER — BENZONATATE 200 MG PO CAPS: 200.0000 mg | ORAL_CAPSULE | Freq: Three times a day (TID) | ORAL | 1 refills | Status: DC | PRN

## 2018-11-19 MED ORDER — DICYCLOMINE HCL 10 MG PO CAPS: 10.0000 mg | ORAL_CAPSULE | Freq: Two times a day (BID) | ORAL | 3 refills | Status: DC | PRN

## 2018-11-19 MED ORDER — SIMVASTATIN 40 MG PO TABS: 40.0000 mg | ORAL_TABLET | Freq: Every day | ORAL | 3 refills | Status: DC

## 2018-11-19 MED ORDER — CLONAZEPAM 0.5 MG PO TABS: 0.5000 mg | ORAL_TABLET | Freq: Every day | ORAL | 1 refills | Status: DC | PRN

## 2018-11-19 MED ORDER — CYCLOBENZAPRINE HCL 10 MG PO TABS: 10.0000 mg | ORAL_TABLET | Freq: Every day | ORAL | 3 refills | Status: DC | PRN

## 2018-11-19 NOTE — Patient Instructions (Signed)
Recheck A1c in about 6 months at a visit, sooner if needed.  Cut back on the metformin if needed.  Thanks for your effort.   Use tessalon for the cough. Update me as needed.    Take care.  Glad to see you.

## 2018-11-19 NOTE — Progress Notes (Signed)
Diabetes:  Using medications without difficulties: see below Hypoglycemic episodes: noted on higher dose of metformin, improved when she cut her dose  Hyperglycemic episodes:no Feet problems:no Blood Sugars averaging: not checked often A1c some better.  D/w pt at OV.   Weight loss with inc activity.  She has noted more joint pains with weather changes.    She has full custody of her grandson Abigail Miyamoto, who is 54 months old.  She was fired 2 weeks prior to Christmas.  This happened since I saw her at the at last OV. She is looking for a job.  Discussed.  She is trying to manage all the changes in her life.  She needed a refill on dicyclomine.    She had trouble getting vytorin filled due to cost, d/w pt about separate fills to see if that is cheaper.  Prescription sent.  Recent URI sx.  ST started Friday.  No fevers.  No vomiting.  Clear sputum.  Coughing.  Loose stools.  Scant wheeze last night, not today.    Meds, vitals, and allergies reviewed.  ROS: Per HPI unless specifically indicated in ROS section   GEN: nad, alert and oriented HEENT: mucous membranes moist, tm w/o erythema, nasal exam w/o erythema, clear discharge noted,  OP with cobblestoning NECK: supple w/o LA CV: rrr.   PULM: ctab, no inc wob EXT: no edema SKIN: no acute rash

## 2018-11-21 DIAGNOSIS — J069 Acute upper respiratory infection, unspecified: Secondary | ICD-10-CM | POA: Insufficient documentation

## 2018-11-21 NOTE — Assessment & Plan Note (Signed)
She had trouble getting vytorin filled due to cost, d/w pt about separate fills to see if that is cheaper.  Prescription sent.

## 2018-11-21 NOTE — Assessment & Plan Note (Signed)
A1c some better.  D/w pt at OV.  Considering the amount of upheaval in her life her A1c is actually good. Goal to avoid hypoglycemia. Recheck A1c in about 6 months at a visit, sooner if needed.  Cut back on the metformin if needed.  She agrees with plan.

## 2018-11-21 NOTE — Assessment & Plan Note (Signed)
Nontoxic.  Okay for outpatient follow-up. Use tessalon for the cough. Update me as needed.

## 2018-11-23 ENCOUNTER — Other Ambulatory Visit: Payer: Medicare Other | Admitting: *Deleted

## 2018-11-23 ENCOUNTER — Other Ambulatory Visit: Payer: Self-pay

## 2018-11-23 DIAGNOSIS — E782 Mixed hyperlipidemia: Secondary | ICD-10-CM

## 2018-11-24 LAB — HEPATIC FUNCTION PANEL
ALT: 33 IU/L — ABNORMAL HIGH (ref 0–32)
AST: 28 IU/L (ref 0–40)
Albumin: 4.2 g/dL (ref 3.8–4.8)
Alkaline Phosphatase: 99 IU/L (ref 39–117)
Bilirubin Total: 0.5 mg/dL (ref 0.0–1.2)
Bilirubin, Direct: 0.14 mg/dL (ref 0.00–0.40)
Total Protein: 6.5 g/dL (ref 6.0–8.5)

## 2018-11-24 LAB — LIPID PANEL
Chol/HDL Ratio: 7 ratio — ABNORMAL HIGH (ref 0.0–4.4)
Cholesterol, Total: 246 mg/dL — ABNORMAL HIGH (ref 100–199)
HDL: 35 mg/dL — ABNORMAL LOW (ref >39)
LDL Calculated: 182 mg/dL — ABNORMAL HIGH (ref 0–99)
Triglycerides: 144 mg/dL (ref 0–149)
VLDL Cholesterol Cal: 29 mg/dL (ref 5–40)

## 2018-11-27 ENCOUNTER — Telehealth: Payer: Self-pay

## 2018-11-27 DIAGNOSIS — E785 Hyperlipidemia, unspecified: Secondary | ICD-10-CM

## 2018-11-27 NOTE — Telephone Encounter (Signed)
Called patient with results. Per Dr. Johnsie Cancel, LDL too high on zetia ? Intolerant to statins f/u with lipid clinic for alternative. Patient stated her PCP started her on zetia 10 mg and simvastatin 40 mg. Patient was agreeable to seeing pharm D for lipid clinic. Scheduled patient with lipid clinic.

## 2018-11-27 NOTE — Telephone Encounter (Signed)
-----   Message from Josue Hector, MD sent at 11/26/2018  8:27 AM EDT ----- LDL too high on zetia ? Intolerant to statins f/u with lipid clinic for alternative

## 2018-11-30 ENCOUNTER — Ambulatory Visit: Payer: Medicare Other | Admitting: Cardiovascular Disease

## 2018-12-17 ENCOUNTER — Telehealth: Payer: Self-pay

## 2018-12-17 NOTE — Telephone Encounter (Signed)
Virtual Visit Pre-Appointment Phone Call  Steps For Call:  1. Confirm consent - "In the setting of the current Covid19 crisis, you are scheduled for a (phone or video) visit with your provider on (date) at (time).  Just as we do with many in-office visits, in order for you to participate in this visit, we must obtain consent.  If you'd like, I can send this to your mychart (if signed up) or email for you to review.  Otherwise, I can obtain your verbal consent now.  All virtual visits are billed to your insurance company just like a normal visit would be.  By agreeing to a virtual visit, we'd like you to understand that the technology does not allow for your provider to perform an examination, and thus may limit your provider's ability to fully assess your condition.  Finally, though the technology is pretty good, we cannot assure that it will always work on either your or our end, and in the setting of a video visit, we may have to convert it to a phone-only visit.  In either situation, we cannot ensure that we have a secure connection.  Are you willing to proceed?"  2. Give patient instructions for WebEx download to smartphone as below if video visit  3. Advise patient to be prepared with any vital sign or heart rhythm information, their current medicines, and a piece of paper and pen handy for any instructions they may receive the day of their visit  4. Inform patient they will receive a phone call 15 minutes prior to their appointment time (may be from unknown caller ID) so they should be prepared to answer  5. Confirm that appointment type is correct in Epic appointment notes (video vs telephone)    TELEPHONE CALL NOTE  Allison Yoder has been deemed a candidate for a follow-up tele-health visit to limit community exposure during the Covid-19 pandemic. I spoke with the patient via phone to ensure availability of phone/video source, confirm preferred email & phone number, and discuss  instructions and expectations.  I reminded Allison Yoder to be prepared with any vital sign and/or heart rhythm information that could potentially be obtained via home monitoring, at the time of her visit. I reminded Allison Yoder to expect a phone call at the time of her visit if her visit.  Did the patient verbally acknowledge consent to treatment? Yes  Jacinta Letson, [place] 12/17/2018 3:17 PM   DOWNLOADING THE [place]  - If Apple, go to [employer] and type in WebEx in the search bar. [place] [employer], the blue/green circle. The app is free but as with any other app downloads, their phone may require them to verify saved payment information or Apple password. The patient does NOT have to create an account.  - If Android, ask patient to go to [employer] and type in WebEx in the search bar. [place] [employer], the blue/green circle. The app is free but as with any other app downloads, their phone may require them to verify saved payment information or Android password. The patient does NOT have to create an account.   CONSENT FOR TELE-HEALTH VISIT - PLEASE REVIEW  I hereby voluntarily request, consent and authorize [facility] and its employed or contracted physicians, physician assistants, nurse practitioners or other licensed health care professionals (the Practitioner), to provide me with telemedicine health care services (the "Services") as deemed necessary by the treating Practitioner.  I acknowledge and consent to receive the Services by the Practitioner via telemedicine. I understand that the telemedicine visit will involve communicating with the Practitioner through live audiovisual communication technology and the disclosure of certain medical information by electronic transmission. I acknowledge that I have been given the opportunity to request an in-person assessment or other available alternative prior to the telemedicine visit and am  voluntarily participating in the telemedicine visit.  I understand that I have the right to withhold or withdraw my consent to the use of telemedicine in the course of my care at any time, without affecting my right to future care or treatment, and that the Practitioner or I may terminate the telemedicine visit at any time. I understand that I have the right to inspect all information obtained and/or recorded in the course of the telemedicine visit and may receive copies of available information for a reasonable fee.  I understand that some of the potential risks of receiving the Services via telemedicine include:  Marland Kitchen Delay or interruption in medical evaluation due to technological equipment failure or disruption; . Information transmitted may not be sufficient (e.g. poor resolution of images) to allow for appropriate medical decision making by the Practitioner; and/or  . In rare instances, security protocols could fail, causing a breach of personal health information.  Furthermore, I acknowledge that it is my responsibility to provide information about my medical history, conditions and care that is complete and accurate to the best of my ability. I acknowledge that Practitioner's advice, recommendations, and/or decision may be based on factors not within their control, such as incomplete or inaccurate data provided by me or distortions of diagnostic images or specimens that may result from electronic transmissions. I understand that the practice of medicine is not an exact science and that Practitioner makes no warranties or guarantees regarding treatment outcomes. I acknowledge that I will receive a copy of this consent concurrently upon execution via email to the email address I last provided but may also request a printed copy by calling the office of [place].    I understand that my insurance will be billed for this visit.   I have read or had this consent read to me. . I understand the  contents of this consent, which adequately explains the benefits and risks of the Services being provided via telemedicine.  . I have been provided ample opportunity to ask questions regarding this consent and the Services and have had my questions answered to my satisfaction. . I give my informed consent for the services to be provided through the use of telemedicine in my medical care  By participating in this telemedicine visit I agree to the above.

## 2018-12-17 NOTE — Telephone Encounter (Signed)
Spoke with pt who is agreeable to see Dr. Johnsie Cancel on 4/7 at 2:30 virtually. Pt has been sent a mychart message with consent and instructions on how to download webex. Pt states that she is very familiar with how to download an app and with download webex later. Pt states if she has any trouble she will call our office. Pt verbalized understanding and thanked me for the call.

## 2018-12-18 ENCOUNTER — Other Ambulatory Visit: Payer: Self-pay

## 2018-12-18 ENCOUNTER — Encounter: Payer: Self-pay | Admitting: Cardiovascular Disease

## 2018-12-18 ENCOUNTER — Telehealth (INDEPENDENT_AMBULATORY_CARE_PROVIDER_SITE_OTHER): Payer: Medicare Other | Admitting: Cardiovascular Disease

## 2018-12-18 VITALS — BP 144/90 | HR 75 | Ht 66.0 in | Wt 166.0 lb

## 2018-12-18 DIAGNOSIS — E782 Mixed hyperlipidemia: Secondary | ICD-10-CM

## 2018-12-18 MED ORDER — ATORVASTATIN CALCIUM 10 MG PO TABS: 10.0000 mg | ORAL_TABLET | Freq: Every day | ORAL | 3 refills | Status: DC

## 2018-12-18 NOTE — Patient Instructions (Addendum)
Medication Instructions:  Your physician has recommended you make the following change in your medication:  1-START Lipitor 10 mg by mouth daily  If you need a refill on your cardiac medications before your next appointment, please call your pharmacy.   Lab work:  If you have labs (blood work) drawn today and your tests are completely normal, you will receive your results only by: Marland Kitchen MyChart Message (if you have MyChart) OR . A paper copy in the mail If you have any lab test that is abnormal or we need to change your treatment, we will call you to review the results.  Testing/Procedures: None ordered today  Follow-Up: At [facility], you and your health needs are our priority.  As part of our continuing mission to provide you with exceptional heart care, we have created designated Provider Care Teams.  These Care Teams include your primary Cardiologist (physician) and Advanced Practice Providers (APPs -  Physician Assistants and Nurse Practitioners) who all work together to provide you with the care you need, when you need it. You will need a follow up appointment in 6 months.  Please call our office 2 months in advance to schedule this appointment.  You may see Jenkins Rouge, MD or one of the following Advanced Practice Providers on your designated Care Team:   Truitt Merle, NP Cecilie Kicks, NP . Kathyrn Drown, NP

## 2018-12-18 NOTE — Progress Notes (Signed)
Cardiology Office Note   Date:  12/18/2018   ID:  Allison Yoder, DOB 14-Jun-1951, MRN [id number]  PCP:  Tonia Ghent, MD  Cardiologist:   Jenkins Rouge, MD   No chief complaint on file.     History of Present Illness:  68 y.o. chest pain 12/01/16.  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 Allergy   . Anemia    "all the time as a child"  . Anxiety   . Arthritis    "all my joints; worse in my hands" (10/27/2015)  . Chronic lower back pain   . Depression   . Diverticulosis   . Fatty liver   . GERD (gastroesophageal reflux disease)   . Heart murmur    "dx'd years ago; very mild; never treated" (10/27/2015)  . Hyperlipidemia   . Hypertension   . Hypothyroidism 2002-~ 2010  . Iritis    per [facility]  . Migraines    "stopped when I went thru menopause"  . OSA on CPAP   . Pneumonia ~ 2013; 10/27/2015  . Recurrent urinary tract infection   . Routine general medical examination at a health care facility 02/26/2014  . Snores   . Stroke ([place])   . Type II diabetes mellitus ([place])    "lost weight; started exercising again; don't have it anymore" (10/27/2015)  . Urinary incontinence   . Wears glasses     Past Surgical History:  Procedure Laterality Date  . BLEPHAROPLASTY Bilateral 2013; 2016  . BREAST LUMPECTOMY Right 1964   benign tumor,  . HEMORRHOID BANDING  X 2  . KNEE ARTHROSCOPY Left 2016   "meniscus repair"  .  PELVIC FLOOR REPAIR  2001   "lift"  . POLYPECTOMY  X 3   "bladder"  . SHOULDER ARTHROSCOPY W/ ROTATOR CUFF REPAIR Left 2013  . SHOULDER ARTHROSCOPY W/ ROTATOR CUFF REPAIR Right 2015  . TUBAL LIGATION  ~ 1986  . VAGINAL HYSTERECTOMY  1992   Partial     Current Outpatient Medications  Medication Sig Dispense Refill  . albuterol (PROVENTIL HFA;VENTOLIN HFA) 108 (90 Base) MCG/ACT inhaler Inhale 1-2 puffs into the lungs every 6 (six) hours as needed for wheezing or shortness of breath. 1 Inhaler 1  . atenolol (TENORMIN) 50 MG tablet TAKE 1 TABLET BY MOUTH  DAILY 90 tablet 3  . benzonatate (TESSALON) 200 MG capsule Take 200 mg by mouth 3 (three) times daily as needed for cough.    . Blood Glucose Monitoring Suppl (ONE TOUCH ULTRA 2) w/Device KIT Check blood sugar once daily and as instructed. Dx E11.9 1 each 0  . cetirizine (ZYRTEC) 10 MG tablet Take 10 mg by mouth daily as needed for allergies.     . citalopram (CELEXA) 20 MG tablet TAKE 1 TABLET BY MOUTH  DAILY 90 tablet 0  . clonazePAM (KLONOPIN) 0.5 MG tablet Take 1  tablet (0.5 mg total) by mouth daily as needed for anxiety. 30 tablet 1  . cyanocobalamin 1000 MCG tablet Take 1,000 mcg by mouth daily.    . cyclobenzaprine (FLEXERIL) 10 MG tablet Take 1 tablet (10 mg total) by mouth daily as needed for muscle spasms. 90 tablet 3  . ezetimibe (ZETIA) 10 MG tablet Take 1 tablet (10 mg total) by mouth daily. 90 tablet 3  . fluticasone (FLONASE) 50 MCG/ACT nasal spray Place into both nostrils daily as needed for allergies or rhinitis.    Marland Kitchen glucose blood (ONE TOUCH ULTRA TEST) test strip Check blood sugar once daily and as instructed. Dx E11.9 100 each 1  . lisinopril (PRINIVIL,ZESTRIL) 10 MG tablet TAKE 1 TABLET BY MOUTH AT  BEDTIME 90 tablet 2  . metFORMIN (GLUCOPHAGE) 500 MG tablet TAKE 1 TABLET BY MOUTH TWO  TIMES DAILY WITH A MEAL 180 tablet 2  . Multiple Vitamin (MULTIVITAMIN) tablet Take 1 tablet by mouth daily.    Marland Kitchen NITROFURANTOIN PO Take  100 mg by mouth daily as needed.     [name] DELICA LANCETS 86V MISC Check blood sugar once daily and as instructed. Dx E11.9 100 each 1  . simvastatin (ZOCOR) 40 MG tablet Take 1 tablet (40 mg total) by mouth at bedtime. 90 tablet 3  . Vitamin D, Cholecalciferol, 1000 units CAPS Take 3,000 Units by mouth daily.    Marland Kitchen atorvastatin (LIPITOR) 10 MG tablet Take 1 tablet (10 mg total) by mouth daily. 90 tablet 3   No current facility-administered medications for this visit.     Allergies:   Cephalexin; Codeine; Inapsine [droperidol]; Lipitor [atorvastatin calcium]; Niacin and related; Pravastatin; and Adhesive [tape]    Social History:  The patient  reports that she quit smoking about 33 years ago. Her smoking use included cigarettes. She has a 10.00 pack-year smoking history. She has never used smokeless tobacco. She reports current alcohol use of about 3.0 standard drinks of alcohol per week. She reports that she does not use drugs.   Family History:  The patient's family history includes Alcohol abuse in her father; Arthritis in her maternal grandmother, mother, and sister; Breast cancer in her cousin; Dementia in her paternal grandmother; Depression in her mother; Diabetes in her maternal grandmother, mother, paternal uncle, and sister; Heart disease in her mother, paternal aunt, paternal aunt, paternal uncle, and paternal uncle; Hyperlipidemia in her mother; Hypertension in her mother; Lung cancer in her father; Stroke in her maternal grandmother, mother, and paternal aunt.    ROS:  Please see the history of present illness.   Otherwise, review of systems are positive for none.   All other systems are reviewed and negative.    PHYSICAL EXAM: VS:  BP (!) 144/90   Pulse 75   Ht _0  (1.676 m)   Wt 75.3 kg   BMI 26.79 kg/m  , BMI Body mass index is 26.79 kg/m. Affect appropriate Healthy:  appears stated age 39: normal Neck supple with no adenopathy JVP normal no bruits no  thyromegaly Lungs clear with no wheezing and good diaphragmatic motion Heart:  S1/S2 no murmur, no rub, gallop or click PMI normal Abdomen: benighn, BS positve, no tenderness, no AAA no bruit.  No HSM or HJR Distal pulses intact with no bruits No edema Neuro non-focal Skin warm and dry No muscular weakness     EKG:  NSR normal ECG  11/20/17    Recent Labs: 04/10/2018: BUN 14; Creatinine, Ser 0.76; Potassium 4.4;  Sodium 140 06/08/2018: TSH 2.28 11/23/2018: ALT 33    Lipid Panel    Component Value Date/Time   CHOL 246 (H) 11/23/2018 1410   TRIG 144 11/23/2018 1410   HDL 35 (L) 11/23/2018 1410   CHOLHDL 7.0 (H) 11/23/2018 1410   CHOLHDL 3 06/08/2018 1327   VLDL 19.6 06/08/2018 1327   LDLCALC 182 (H) 11/23/2018 1410   LDLDIRECT 66.0 08/15/2017 0743      Wt Readings from Last 3 Encounters:  12/18/18 75.3 kg  11/19/18 77.8 kg  06/21/18 81.4 kg      Other studies Reviewed: Additional studies/ records that were reviewed today include: CXR, ECG labs and records ER visit 12/01/16.    ASSESSMENT AND PLAN:  1.  Chest Pain:  Normal ETT 12/14/16 Calcium Score 172 86 th percentile 12/16/16 Calcium was noted In proximal RCA and LAD  2. Chol:  Continue zetia  For now pending lipid appointment to start bempedmoic acid or statin alternative For now will call in generic lipitor 10 mg to see if she tolerates this with the zetia  3. HTN:  Well controlled.  Continue current medications and low sodium Dash type diet.   4. DM:  Discussed low carb diet.  Target hemoglobin A1c is 6.5 or less.  Continue current medications. 5. Depression on celexa f/u Dr Damita Dunnings  6. Stroke: lacunar on ASA carotids with no high grade lesion f/u neuro    Current medicines are reviewed at length with the patient today.  The patient does not have concerns regarding medicines.  The following changes have been made:  D/c VytorinTM start Lipitor 10 mg   Labs/ tests ordered today include:  None   No orders of  the defined types were placed in this encounter.    Disposition:   FU with me in 6 months     Signed, Jenkins Rouge, MD  12/18/2018 2:26 PM    [facility] [address], [place], [place]  [address] Phone: [phone]; Fax: [phone]

## 2018-12-21 ENCOUNTER — Ambulatory Visit: Payer: Medicare Other | Admitting: Family Medicine

## 2018-12-26 ENCOUNTER — Ambulatory Visit: Payer: Medicare Other

## 2019-01-16 ENCOUNTER — Telehealth: Payer: Self-pay | Admitting: Pharmacist

## 2019-01-16 DIAGNOSIS — N958 Other specified menopausal and perimenopausal disorders: Secondary | ICD-10-CM | POA: Diagnosis not present

## 2019-01-16 DIAGNOSIS — M8588 Other specified disorders of bone density and structure, other site: Secondary | ICD-10-CM | POA: Diagnosis not present

## 2019-01-16 DIAGNOSIS — Z1231 Encounter for screening mammogram for malignant neoplasm of breast: Secondary | ICD-10-CM | POA: Diagnosis not present

## 2019-01-16 DIAGNOSIS — E785 Hyperlipidemia, unspecified: Secondary | ICD-10-CM

## 2019-01-16 DIAGNOSIS — N952 Postmenopausal atrophic vaginitis: Secondary | ICD-10-CM | POA: Diagnosis not present

## 2019-01-16 DIAGNOSIS — Z01419 Encounter for gynecological examination (general) (routine) without abnormal findings: Secondary | ICD-10-CM | POA: Diagnosis not present

## 2019-01-16 DIAGNOSIS — E782 Mixed hyperlipidemia: Secondary | ICD-10-CM

## 2019-01-16 DIAGNOSIS — Z6827 Body mass index (BMI) 27.0-27.9, adult: Secondary | ICD-10-CM | POA: Diagnosis not present

## 2019-01-16 NOTE — Telephone Encounter (Deleted)
Patient ID: Allison Yoder                 DOB: [birth year]                    MRN: [id number]     HPI: Allison Yoder is a 68 y.o. female patient referred to lipid clinic by Dr. Johnsie Cancel. PMH is significant for HTN, DM, HLD, hypothyroidism, h/o TIA  LDL continues to be elevated at 182. Patient cannot afford Vytorin and is currently not taking. Also patient has reported myalgia with Vytorin. Vytorin was discontinued on 12/18/2018 and atorvastatin 2m daily was initiated. Calcium score was elevated on 12/16/2016 at 172 (86th percentile for age and sex)  Current Medications: Zetia 190mdaily, atorvastatin 1015maily Intolerances:  Risk Factors: DM, elevated calcium score of 172 on statin therapy  LDL goal:   Diet:   Exercise:   Family History: alcohol abuse in father, arthritis in maternal grandmother, mother, and sister; breast cancer in cousin; diabetes in maternal grandmother, mother, and sister; heart disease in mother, paternal aunt, and paternal uncle; hyperlipidemia in mother; hypertension in mother; stroke in maternal grandmother, mother and paternal aunt.   -Verify adherence to zetia and atorvastatin  -tolerance to statin therapy  -Schedule labs for  Social History: Quit smoking about 33 years ago (10 pack-year smoking history). Reports drinking alcohol 3x per week.   Labs: Lipid panel on 11/23/2018: Total Chol 246; TG 144; HDL 35; LDL 182 (LDL 38 in 05/2018)  Past Medical History:  Diagnosis Date  . Allergy   . Anemia    "all the time as a child"  . Anxiety   . Arthritis    "all my joints; worse in my hands" (10/27/2015)  . Chronic lower back pain   . Depression   . Diverticulosis   . Fatty liver   . GERD (gastroesophageal reflux disease)   . Heart murmur    "dx'd years ago; very mild; never treated" (10/27/2015)  . Hyperlipidemia   . Hypertension   . Hypothyroidism 2002-~ 2010  . [facility]ritis    [facility]    "stopped when I went thru menopause"   . OSA on CPAP   . Pneumonia ~ 2013; 10/27/2015  . Recurrent urinary tract infection   . Routine general medical examination at a health care facility 02/26/2014  . Snores   . [place]  "lost weight; started exercising again; don't have it anymore" (10/27/2015)  . Urinary incontinence   . Wears glasses     Current Outpatient Medications on File Prior to Visit  Medication Sig Dispense Refill  . albuterol (PROVENTIL HFA;VENTOLIN HFA) 108 (90 Base) MCG/ACT inhaler Inhale 1-2 puffs into the lungs every 6 (six) hours as needed for wheezing or shortness of breath. 1 Inhaler 1  . atenolol (TENORMIN) 50 MG tablet TAKE 1 TABLET BY MOUTH  DAILY 90 tablet 3  . atorvastatin (LIPITOR) 10 MG tablet Take 1 tablet (10 mg total) by mouth daily. 90 tablet 3  . benzonatate (TESSALON) 200 MG capsule Take 200 mg by mouth 3 (three) times daily as needed for cough.    . Blood Glucose Monitoring Suppl (ONE TOUCH ULTRA 2) w/Device KIT Check blood sugar once daily and as instructed. Dx E11.9 1 each 0  . cetirizine (ZYRTEC) 10 MG tablet Take 10 mg by mouth daily as needed for allergies.     .Marland Kitchen  citalopram (CELEXA) 20 MG tablet TAKE 1 TABLET BY MOUTH  DAILY 90 tablet 0  . clonazePAM (KLONOPIN) 0.5 MG tablet Take 1 tablet (0.5 mg total) by mouth daily as needed for anxiety. 30 tablet 1  . cyanocobalamin 1000 MCG tablet Take 1,000 mcg by mouth daily.    . cyclobenzaprine (FLEXERIL) 10 MG tablet Take 1 tablet (10 mg total) by mouth daily as needed for muscle spasms. 90 tablet 3  . ezetimibe (ZETIA) 10 MG tablet Take 1 tablet (10 mg total) by mouth daily. 90 tablet 3  . fluticasone (FLONASE) 50 MCG/ACT nasal spray Place into both nostrils daily as needed for allergies or rhinitis.    Marland Kitchen glucose blood (ONE TOUCH ULTRA TEST) test strip Check blood sugar once daily and as instructed. Dx E11.9 100 each 1  . lisinopril (PRINIVIL,ZESTRIL) 10 MG tablet TAKE 1 TABLET BY MOUTH AT  BEDTIME 90  tablet 2  . metFORMIN (GLUCOPHAGE) 500 MG tablet TAKE 1 TABLET BY MOUTH TWO  TIMES DAILY WITH A MEAL 180 tablet 2  . Multiple Vitamin (MULTIVITAMIN) tablet Take 1 tablet by mouth daily.    Marland Kitchen NITROFURANTOIN PO Take 100 mg by mouth daily as needed.     [name] DELICA LANCETS 25P MISC Check blood sugar once daily and as instructed. Dx E11.9 100 each 1  . simvastatin (ZOCOR) 40 MG tablet Take 1 tablet (40 mg total) by mouth at bedtime. 90 tablet 3  . Vitamin D, Cholecalciferol, 1000 units CAPS Take 3,000 Units by mouth daily.     No current facility-administered medications on file prior to visit.     Allergies  Allergen Reactions  . Cephalexin Itching    Does tolerate augmentin  . Codeine Itching  . Inapsine [Droperidol] Shortness Of Breath, Anxiety and Hypertension    Elevated HR and BP, panic attack  . Lipitor [Atorvastatin Calcium]     Myalgias   . Niacin And Related     Flushing  . Pravastatin     Myalgias  . Adhesive [Tape] Rash    Blisters with tape    Assessment/Plan:  1. Hyperlipidemia -

## 2019-01-17 NOTE — Telephone Encounter (Addendum)
Patient ID: Allison Yoder                 DOB: 28-Nov-1950                    MRN: [id number]     HPI: Allison Yoder is a 68 y.o. female patient referred to lipid clinic by Dr. Johnsie Cancel. PMH is significant for HTN, DM, HLD, hypothyroidism, h/o TIA  LDL continues to be elevated at 182. Patient cannot afford Vytorin and is currently not taking. Also patient has reported myalgia with Vytorin. Vytorin was discontinued on 12/18/2018 and atorvastatin 75m daily was initiated. Calcium score was elevated on 12/16/2016 at 172 (86th percentile for age and sex)  Conducted pharmacy clinic visit on 5/20 via telehealth visit due to limiting in office visits with current Coronavirus pandemic. Patient reports compliance with both Zetia 10 mg daily and atorvastatin 10 mg daily. Patient states she has been having some muscle aches in her legs, thighs and hips with the atorvastatin, however states that this has been tolerable and is not something is is concerned with. Patient also states that she has been trying to improve her diet, especially now that her brother need to eat a healthier diet for his health conditions.   Current Medications: Zetia 10 mg daily, atorvastatin 10 mg daily Intolerances: Simvastatin (myalgias)  Risk Factors: Stroke, DM, elevated calcium score of 172 on statin therapy  LDL goal: <70  Diet: Patient states that her diet has overall improved, especially because her brother is now on a diet as well and it has been easier to improve their diet together. Patient states she normally has a smaller breakfast that may consist of granola bas, coffee, whole wheat protein bagels with whipped cream cheese with honey and almonds. For lunch, will typically have rye bread sandwiches with ham, mayo, mustard, and swiss cheese. For dinner, patient has been having chick salad, tuna salad, chicken pie, beef tacos, whole wheat bread. Patient also states she occasionally has sausage gravy on the weekends, but has been  trying to limit this.   Exercise: Will take a walk and stroll grandchild around the neighborhood 3-4 times a week for 1 to 1.5 miles.   Family History:  alcohol abuse in father, arthritis in maternal grandmother, mother, and sister; breast cancer in cousin; diabetes in maternal grandmother, mother, and sister; heart disease in mother, paternal aunt, and paternal uncle; hyperlipidemia in mother; hypertension in mother; stroke in maternal grandmother, mother and paternal aunt.  Social History: Quit smoking about 33 years ago (10 pack-year smoking history). Reports drinking alcohol 3x per week.   Labs:Lipid panel on 11/23/2018: Total Chol 246; TG 144; HDL 35; LDL 182 (LDL 38 in 05/2018)  Past Medical History:  Diagnosis Date   Allergy    Anemia    "all the time as a child"   Anxiety    Arthritis    "all my joints; worse in my hands" (10/27/2015)   Chronic lower back pain    Depression    Diverticulosis    Fatty liver    GERD (gastroesophageal reflux disease)    Heart murmur    "dx'd years ago; very mild; never treated" (10/27/2015)   Hyperlipidemia    Hypertension    Hypothyroidism 2002-~ 2010   [facility]  Migraines    "stopped when I went thru menopause"   OSA on CPAP    Pneumonia ~ 2013; 10/27/2015  Recurrent urinary tract infection    Routine general medical examination at a health care facility 02/26/2014   Snores    Stroke [facility])    Type II diabetes mellitus ([place])    "lost weight; started exercising again; don't have it anymore" (10/27/2015)   Urinary incontinence    Wears glasses     Current Outpatient Medications on File Prior to Visit  Medication Sig Dispense Refill   albuterol (PROVENTIL HFA;VENTOLIN HFA) 108 (90 Base) MCG/ACT inhaler Inhale 1-2 puffs into the lungs every 6 (six) hours as needed for wheezing or shortness of breath. 1 Inhaler 1   atenolol (TENORMIN) 50 MG tablet TAKE 1 TABLET BY MOUTH  DAILY 90 tablet 3     atorvastatin (LIPITOR) 10 MG tablet Take 1 tablet (10 mg total) by mouth daily. 90 tablet 3   benzonatate (TESSALON) 200 MG capsule Take 200 mg by mouth 3 (three) times daily as needed for cough.     Blood Glucose Monitoring Suppl (ONE TOUCH ULTRA 2) w/Device KIT Check blood sugar once daily and as instructed. Dx E11.9 1 each 0   cetirizine (ZYRTEC) 10 MG tablet Take 10 mg by mouth daily as needed for allergies.      citalopram (CELEXA) 20 MG tablet TAKE 1 TABLET BY MOUTH  DAILY 90 tablet 0   clonazePAM (KLONOPIN) 0.5 MG tablet Take 1 tablet (0.5 mg total) by mouth daily as needed for anxiety. 30 tablet 1   cyanocobalamin 1000 MCG tablet Take 1,000 mcg by mouth daily.     cyclobenzaprine (FLEXERIL) 10 MG tablet Take 1 tablet (10 mg total) by mouth daily as needed for muscle spasms. 90 tablet 3   ezetimibe (ZETIA) 10 MG tablet Take 1 tablet (10 mg total) by mouth daily. 90 tablet 3   fluticasone (FLONASE) 50 MCG/ACT nasal spray Place into both nostrils daily as needed for allergies or rhinitis.     glucose blood (ONE TOUCH ULTRA TEST) test strip Check blood sugar once daily and as instructed. Dx E11.9 100 each 1   lisinopril (PRINIVIL,ZESTRIL) 10 MG tablet TAKE 1 TABLET BY MOUTH AT  BEDTIME 90 tablet 2   metFORMIN (GLUCOPHAGE) 500 MG tablet TAKE 1 TABLET BY MOUTH TWO  TIMES DAILY WITH A MEAL 180 tablet 2   Multiple Vitamin (MULTIVITAMIN) tablet Take 1 tablet by mouth daily.     NITROFURANTOIN PO Take 100 mg by mouth daily as needed.      ONETOUCH DELICA LANCETS 31D MISC Check blood sugar once daily and as instructed. Dx E11.9 100 each 1   simvastatin (ZOCOR) 40 MG tablet Take 1 tablet (40 mg total) by mouth at bedtime. 90 tablet 3   Vitamin D, Cholecalciferol, 1000 units CAPS Take 3,000 Units by mouth daily.     No current facility-administered medications on file prior to visit.     Allergies  Allergen Reactions   Cephalexin Itching    Does tolerate augmentin    Codeine Itching   Inapsine [Droperidol] Shortness Of Breath, Anxiety and Hypertension    Elevated HR and BP, panic attack   Lipitor [Atorvastatin Calcium]     Myalgias    Niacin And Related     Flushing   Pravastatin     Myalgias   Adhesive [Tape] Rash    Blisters with tape    Assessment/Plan:  1. Hyperlipidemia - Patient followed by lipid clinic for cholesterol management with risk factors including diabetes and elevated calcium score along with clinical ASCVD secondary to stroke.  Patient's LDL was above goal in March of 2020 with an LDL of 182 (goal of <70). Will continue patient on Zetia 10 mg daily and will increase atorvastatin to 20 mg daily. Patient preferred increasing dose of atorvastatin instead of switching statin to rosuvastatin at this time. Informed patient to call pharmacy clinic if she experiences worsening muscle pains with the increased dose. If this occurs, informed patient that we could switch her to rosuvastatin instead as it is usually better tolerated. Also encouraged patient to continue improving diet and educated patient to limit amount of transfats and saturated fats consumed. Will follow up with patient after lipid panel on July 28th - educated patient to fast starting 8 pm the night before. Patient communicated understanding and was provided pharmacy clinic contact number for any questions.   Gwenlyn Found, [place].Brock D PGY1 Pharmacy Resident  01/30/2019   11:56 AM

## 2019-01-30 ENCOUNTER — Other Ambulatory Visit: Payer: Self-pay | Admitting: Family Medicine

## 2019-02-07 DIAGNOSIS — H35371 Puckering of macula, right eye: Secondary | ICD-10-CM | POA: Diagnosis not present

## 2019-02-07 LAB — HM DIABETES EYE EXAM

## 2019-02-12 ENCOUNTER — Ambulatory Visit: Payer: Medicare Other | Admitting: Cardiovascular Disease

## 2019-02-12 ENCOUNTER — Ambulatory Visit: Payer: Medicare Other

## 2019-02-12 DIAGNOSIS — R87619 Unspecified abnormal cytological findings in specimens from cervix uteri: Secondary | ICD-10-CM | POA: Diagnosis not present

## 2019-02-15 MED ORDER — ROSUVASTATIN CALCIUM 5 MG PO TABS: 5.0000 mg | ORAL_TABLET | Freq: Every day | ORAL | 3 refills | Status: DC

## 2019-02-15 NOTE — Telephone Encounter (Signed)
Called and left VM for patient to return call. Do not believe we called her recently.

## 2019-02-15 NOTE — Telephone Encounter (Addendum)
Patient called to let us know she is not tolerating the higher dose of atorvastatin. Discussed switching to rosuvasatin (lower potential for muscle pains) Stop atorvastatin. When you feel back to baseline start rosuvastatin 5mg  daily. Continue Zetia 10mg  daily. Repeat labs in August scheduled.

## 2019-02-15 NOTE — Telephone Encounter (Signed)
Patient returned call

## 2019-02-15 NOTE — Addendum Note (Signed)
Addended by: Marcelle Overlie D on: 02/15/2019 11:35 AM   Modules accepted: Orders

## 2019-03-29 ENCOUNTER — Encounter: Payer: Self-pay | Admitting: Family Medicine

## 2019-04-09 ENCOUNTER — Other Ambulatory Visit: Payer: Medicare Other

## 2019-04-23 ENCOUNTER — Other Ambulatory Visit: Payer: Medicare Other

## 2019-04-23 ENCOUNTER — Other Ambulatory Visit: Payer: Self-pay

## 2019-04-23 DIAGNOSIS — E782 Mixed hyperlipidemia: Secondary | ICD-10-CM | POA: Diagnosis not present

## 2019-04-23 LAB — LIPID PANEL
Chol/HDL Ratio: 3.4 ratio (ref 0.0–4.4)
Cholesterol, Total: 110 mg/dL (ref 100–199)
HDL: 32 mg/dL — ABNORMAL LOW (ref >39)
LDL Calculated: 54 mg/dL (ref 0–99)
Triglycerides: 121 mg/dL (ref 0–149)
VLDL Cholesterol Cal: 24 mg/dL (ref 5–40)

## 2019-05-01 DIAGNOSIS — Z8719 Personal history of other diseases of the digestive system: Secondary | ICD-10-CM | POA: Diagnosis not present

## 2019-05-01 DIAGNOSIS — K5730 Diverticulosis of large intestine without perforation or abscess without bleeding: Secondary | ICD-10-CM | POA: Diagnosis not present

## 2019-05-02 ENCOUNTER — Telehealth: Payer: Self-pay | Admitting: Cardiovascular Disease

## 2019-05-02 NOTE — Telephone Encounter (Signed)
Pt did not elaborate stated she is returning this office call.

## 2019-05-14 ENCOUNTER — Ambulatory Visit: Payer: Medicare Other

## 2019-05-18 ENCOUNTER — Other Ambulatory Visit: Payer: Self-pay | Admitting: Family Medicine

## 2019-05-24 ENCOUNTER — Telehealth: Payer: Self-pay | Admitting: *Deleted

## 2019-05-24 DIAGNOSIS — M1990 Unspecified osteoarthritis, unspecified site: Secondary | ICD-10-CM

## 2019-05-24 DIAGNOSIS — H20021 Recurrent acute iridocyclitis, right eye: Secondary | ICD-10-CM | POA: Diagnosis not present

## 2019-05-24 NOTE — Telephone Encounter (Signed)
Patient left a voicemail requesting a referral to a rheumatologist. Left message on voicemail for patient to call back. When patient calls back need to know why she needs a referral?

## 2019-05-28 NOTE — Telephone Encounter (Signed)
Patient returned Regina's call.  Patient said she has Reactive Arthritis.She said she's having pain in every joint of her body.  She said she's in a lot of pain. She's taking Tylenol at night. Patient said [facility] was going to refer her to [facility], but she said that would take forever. Patient would like to be referred to [facility]. Patient can go anytime.

## 2019-05-28 NOTE — Telephone Encounter (Signed)
Faxed for records

## 2019-05-28 NOTE — Telephone Encounter (Signed)
I put in the referral.  Thanks.  Please request the most recent eye clinic notes.

## 2019-05-31 ENCOUNTER — Encounter: Payer: Self-pay | Admitting: Family Medicine

## 2019-05-31 ENCOUNTER — Other Ambulatory Visit: Payer: Self-pay | Admitting: *Deleted

## 2019-05-31 ENCOUNTER — Ambulatory Visit (INDEPENDENT_AMBULATORY_CARE_PROVIDER_SITE_OTHER): Payer: Medicare Other | Admitting: Family Medicine

## 2019-05-31 ENCOUNTER — Other Ambulatory Visit: Payer: Self-pay

## 2019-05-31 VITALS — BP 122/72 | HR 79 | Temp 97.9°F | Ht 66.0 in | Wt 173.6 lb

## 2019-05-31 DIAGNOSIS — M2550 Pain in unspecified joint: Secondary | ICD-10-CM

## 2019-05-31 DIAGNOSIS — H209 Unspecified iridocyclitis: Secondary | ICD-10-CM | POA: Diagnosis not present

## 2019-05-31 DIAGNOSIS — F419 Anxiety disorder, unspecified: Secondary | ICD-10-CM

## 2019-05-31 DIAGNOSIS — E782 Mixed hyperlipidemia: Secondary | ICD-10-CM

## 2019-05-31 DIAGNOSIS — E119 Type 2 diabetes mellitus without complications: Secondary | ICD-10-CM | POA: Diagnosis not present

## 2019-05-31 DIAGNOSIS — Z23 Encounter for immunization: Secondary | ICD-10-CM

## 2019-05-31 DIAGNOSIS — H20021 Recurrent acute iridocyclitis, right eye: Secondary | ICD-10-CM | POA: Diagnosis not present

## 2019-05-31 LAB — LIPID PANEL
Cholesterol: 118 mg/dL (ref 0–200)
HDL: 35.4 mg/dL — ABNORMAL LOW (ref >39.00)
LDL Cholesterol: 52 mg/dL (ref 0–99)
NonHDL: 82.25
Total CHOL/HDL Ratio: 3
Triglycerides: 153 mg/dL — ABNORMAL HIGH (ref 0.0–149.0)
VLDL: 30.6 mg/dL (ref 0.0–40.0)

## 2019-05-31 LAB — COMPREHENSIVE METABOLIC PANEL
ALT: 34 U/L (ref 0–35)
AST: 28 U/L (ref 0–37)
Albumin: 4.1 g/dL (ref 3.5–5.2)
Alkaline Phosphatase: 92 U/L (ref 39–117)
BUN: 16 mg/dL (ref 6–23)
CO2: 31 mEq/L (ref 19–32)
Calcium: 9.6 mg/dL (ref 8.4–10.5)
Chloride: 102 mEq/L (ref 96–112)
Creatinine, Ser: 0.75 mg/dL (ref 0.40–1.20)
GFR: 76.65 mL/min (ref >60.00)
Glucose, Bld: 194 mg/dL — ABNORMAL HIGH (ref 70–99)
Potassium: 4.8 mEq/L (ref 3.5–5.1)
Sodium: 139 mEq/L (ref 135–145)
Total Bilirubin: 0.8 mg/dL (ref 0.2–1.2)
Total Protein: 6.6 g/dL (ref 6.0–8.3)

## 2019-05-31 LAB — CBC WITH DIFFERENTIAL/PLATELET
Basophils Absolute: 0.1 10*3/uL (ref 0.0–0.1)
Basophils Relative: 0.7 % (ref 0.0–3.0)
Eosinophils Absolute: 0.4 10*3/uL (ref 0.0–0.7)
Eosinophils Relative: 5 % (ref 0.0–5.0)
HCT: 41.6 % (ref 36.0–46.0)
Hemoglobin: 13.9 g/dL (ref 12.0–15.0)
Lymphocytes Relative: 30.7 % (ref 12.0–46.0)
Lymphs Abs: 2.3 10*3/uL (ref 0.7–4.0)
MCHC: 33.5 g/dL (ref 30.0–36.0)
MCV: 91.5 fl (ref 78.0–100.0)
Monocytes Absolute: 0.5 10*3/uL (ref 0.1–1.0)
Monocytes Relative: 7 % (ref 3.0–12.0)
Neutro Abs: 4.1 10*3/uL (ref 1.4–7.7)
Neutrophils Relative %: 56.6 % (ref 43.0–77.0)
Platelets: 212 10*3/uL (ref 150.0–400.0)
RBC: 4.55 Mil/uL (ref 3.87–5.11)
RDW: 14.2 % (ref 11.5–15.5)
WBC: 7.3 10*3/uL (ref 4.0–10.5)

## 2019-05-31 LAB — HEMOGLOBIN A1C: Hgb A1c MFr Bld: 8.2 % — ABNORMAL HIGH (ref 4.6–6.5)

## 2019-05-31 LAB — TSH: TSH: 2.95 u[IU]/mL (ref 0.35–4.50)

## 2019-05-31 MED ORDER — GLUCOSE BLOOD VI STRP: ORAL_STRIP | 1 refills | Status: DC

## 2019-05-31 NOTE — Patient Instructions (Addendum)
Stop the crestor for 1 week just to see if the aches get any better.  Go to the lab on the way out.  We'll contact you with your lab report. Thanks for getting a flu shot.   Let me know if you have any trouble getting set up at [facility] with rheumatology.  We'll see about a short term prednisone course when I get your labs.  Take care.  Glad to see you.
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
## 2019-06-02 NOTE — Assessment & Plan Note (Signed)
See notes on labs. 

## 2019-06-02 NOTE — Assessment & Plan Note (Signed)
She is doing the best she can to look after her grandson.  She is trying to manage her home situation.  Safety considerations discussed.  See above.

## 2019-06-02 NOTE — Assessment & Plan Note (Addendum)
See notes on labs.  Refer to [facility] rheumatology.  I want her to stop Crestor on the outside chance it is making her aches worse.  If she does not get any better with that then we can consider short course of a low-dose of prednisone, depending on her sugar.  Discussed options.  She agrees.  >25 minutes spent in face to face time with patient, >50% spent in counselling or coordination of care.

## 2019-06-11 DIAGNOSIS — M5030 Other cervical disc degeneration, unspecified cervical region: Secondary | ICD-10-CM | POA: Diagnosis not present

## 2019-06-11 DIAGNOSIS — M9900 Segmental and somatic dysfunction of head region: Secondary | ICD-10-CM | POA: Diagnosis not present

## 2019-06-11 DIAGNOSIS — M62830 Muscle spasm of back: Secondary | ICD-10-CM | POA: Diagnosis not present

## 2019-06-11 DIAGNOSIS — M542 Cervicalgia: Secondary | ICD-10-CM | POA: Diagnosis not present

## 2019-06-11 DIAGNOSIS — R51 Headache: Secondary | ICD-10-CM | POA: Diagnosis not present

## 2019-06-11 DIAGNOSIS — M9901 Segmental and somatic dysfunction of cervical region: Secondary | ICD-10-CM | POA: Diagnosis not present

## 2019-06-11 DIAGNOSIS — M546 Pain in thoracic spine: Secondary | ICD-10-CM | POA: Diagnosis not present

## 2019-06-11 DIAGNOSIS — M9902 Segmental and somatic dysfunction of thoracic region: Secondary | ICD-10-CM | POA: Diagnosis not present

## 2019-06-12 DIAGNOSIS — M5030 Other cervical disc degeneration, unspecified cervical region: Secondary | ICD-10-CM | POA: Diagnosis not present

## 2019-06-12 DIAGNOSIS — M542 Cervicalgia: Secondary | ICD-10-CM | POA: Diagnosis not present

## 2019-06-12 DIAGNOSIS — M9902 Segmental and somatic dysfunction of thoracic region: Secondary | ICD-10-CM | POA: Diagnosis not present

## 2019-06-12 DIAGNOSIS — R51 Headache: Secondary | ICD-10-CM | POA: Diagnosis not present

## 2019-06-12 DIAGNOSIS — M9900 Segmental and somatic dysfunction of head region: Secondary | ICD-10-CM | POA: Diagnosis not present

## 2019-06-12 DIAGNOSIS — M62830 Muscle spasm of back: Secondary | ICD-10-CM | POA: Diagnosis not present

## 2019-06-12 DIAGNOSIS — M546 Pain in thoracic spine: Secondary | ICD-10-CM | POA: Diagnosis not present

## 2019-06-12 DIAGNOSIS — M9901 Segmental and somatic dysfunction of cervical region: Secondary | ICD-10-CM | POA: Diagnosis not present

## 2019-06-14 ENCOUNTER — Encounter: Payer: Self-pay | Admitting: Family Medicine

## 2019-06-26 DIAGNOSIS — M0230 Reiter's disease, unspecified site: Secondary | ICD-10-CM | POA: Diagnosis not present

## 2019-06-26 DIAGNOSIS — H20023 Recurrent acute iridocyclitis, bilateral: Secondary | ICD-10-CM | POA: Diagnosis not present

## 2019-06-27 ENCOUNTER — Ambulatory Visit (INDEPENDENT_AMBULATORY_CARE_PROVIDER_SITE_OTHER): Payer: Medicare Other

## 2019-06-27 VITALS — Wt 169.0 lb

## 2019-06-27 DIAGNOSIS — Z0000 Encounter for general adult medical examination without abnormal findings: Secondary | ICD-10-CM

## 2019-06-27 NOTE — Patient Instructions (Signed)
Allison Yoder , Thank you for taking time to come for your Medicare Wellness Visit. I appreciate your ongoing commitment to your health goals. Please review the following plan we discussed and let me know if I can assist you in the future.   Screening recommendations/referrals: Colonoscopy: up to date, completed 04/17/2015 Mammogram: up to date, completed 12/2018 per patient  Bone Density: up to date, completed 09/12/2016 Recommended yearly ophthalmology/optometry visit for glaucoma screening and checkup Recommended yearly dental visit for hygiene and checkup  Vaccinations: Influenza vaccine: up to date, completed 05/31/2019 Pneumococcal vaccine: series completed Tdap vaccine: up to date, completed 06/25/2012 Shingles vaccine: declined    Advanced directives: Advance directive discussed with you today. I have provided a copy for you to complete at home and have notarized. Once this is complete please bring a copy in to our office so we can scan it into your chart.  Conditions/risks identified: diabetes, hyperlipidemia  Next appointment: 06/28/2019 @ 12 pm    Preventive Care 65 Years and Older, Female Preventive care refers to lifestyle choices and visits with your health care provider that can promote health and wellness. What does preventive care include?  A yearly physical exam. This is also called an annual well check.  Dental exams once or twice a year.  Routine eye exams. Ask your health care provider how often you should have your eyes checked.  Personal lifestyle choices, including:  Daily care of your teeth and gums.  Regular physical activity.  Eating a healthy diet.  Avoiding tobacco and drug use.  Limiting alcohol use.  Practicing safe sex.  Taking low-dose aspirin every day.  Taking vitamin and mineral supplements as recommended by your health care provider. What happens during an annual well check? The services and screenings done by your health care provider  during your annual well check will depend on your age, overall health, lifestyle risk factors, and family history of disease. Counseling  Your health care provider may ask you questions about your:  Alcohol use.  Tobacco use.  Drug use.  Emotional well-being.  Home and relationship well-being.  Sexual activity.  Eating habits.  History of falls.  Memory and ability to understand (cognition).  Work and work Statistician.  Reproductive health. Screening  You may have the following tests or measurements:  Height, weight, and BMI.  Blood pressure.  Lipid and cholesterol levels. These may be checked every 5 years, or more frequently if you are over 46 years old.  Skin check.  Lung cancer screening. You may have this screening every year starting at age 20 if you have a 30-pack-year history of smoking and currently smoke or have quit within the past 15 years.  Fecal occult blood test (FOBT) of the stool. You may have this test every year starting at age 64.  Flexible sigmoidoscopy or colonoscopy. You may have a sigmoidoscopy every 5 years or a colonoscopy every 10 years starting at age 80.  Hepatitis C blood test.  Hepatitis B blood test.  Sexually transmitted disease (STD) testing.  Diabetes screening. This is done by checking your blood sugar (glucose) after you have not eaten for a while (fasting). You may have this done every 1-3 years.  Bone density scan. This is done to screen for osteoporosis. You may have this done starting at age 25.  Mammogram. This may be done every 1-2 years. Talk to your health care provider about how often you should have regular mammograms. Talk with your health care provider about  your test results, treatment options, and if necessary, the need for more tests. Vaccines  Your health care provider may recommend certain vaccines, such as:  Influenza vaccine. This is recommended every year.  Tetanus, diphtheria, and acellular pertussis  (Tdap, Td) vaccine. You may need a Td booster every 10 years.  Zoster vaccine. You may need this after age 59.  Pneumococcal 13-valent conjugate (PCV13) vaccine. One dose is recommended after age 31.  Pneumococcal polysaccharide (PPSV23) vaccine. One dose is recommended after age 52. Talk to your health care provider about which screenings and vaccines you need and how often you need them. This information is not intended to replace advice given to you by your health care provider. Make sure you discuss any questions you have with your health care provider. Document Released: 09/25/2015 Document Revised: 05/18/2016 Document Reviewed: 06/30/2015 Elsevier Interactive Patient Education  2017 [place] Prevention in the Home Falls can cause injuries. They can happen to people of all ages. There are many things you can do to make your home safe and to help prevent falls. What can I do on the outside of my home?  Regularly fix the edges of walkways and driveways and fix any cracks.  Remove anything that might make you trip as you walk through a door, such as a raised step or threshold.  Trim any bushes or trees on the path to your home.  Use bright outdoor lighting.  Clear any walking paths of anything that might make someone trip, such as rocks or tools.  Regularly check to see if handrails are loose or broken. Make sure that both sides of any steps have handrails.  Any raised decks and porches should have guardrails on the edges.  Have any leaves, snow, or ice cleared regularly.  Use sand or salt on walking paths during winter.  Clean up any spills in your garage right away. This includes oil or grease spills. What can I do in the bathroom?  Use night lights.  Install grab bars by the toilet and in the tub and shower. Do not use towel bars as grab bars.  Use non-skid mats or decals in the tub or shower.  If you need to sit down in the shower, use a plastic, non-slip  stool.  Keep the floor dry. Clean up any water that spills on the floor as soon as it happens.  Remove soap buildup in the tub or shower regularly.  Attach bath mats securely with double-sided non-slip rug tape.  Do not have throw rugs and other things on the floor that can make you trip. What can I do in the bedroom?  Use night lights.  Make sure that you have a light by your bed that is easy to reach.  Do not use any sheets or blankets that are too big for your bed. They should not hang down onto the floor.  Have a firm chair that has side arms. You can use this for support while you get dressed.  Do not have throw rugs and other things on the floor that can make you trip. What can I do in the kitchen?  Clean up any spills right away.  Avoid walking on wet floors.  Keep items that you use a lot in easy-to-reach places.  If you need to reach something above you, use a strong step stool that has a grab bar.  Keep electrical cords out of the way.  Do not use floor polish or wax  that makes floors slippery. If you must use wax, use non-skid floor wax.  Do not have throw rugs and other things on the floor that can make you trip. What can I do with my stairs?  Do not leave any items on the stairs.  Make sure that there are handrails on both sides of the stairs and use them. Fix handrails that are broken or loose. Make sure that handrails are as long as the stairways.  Check any carpeting to make sure that it is firmly attached to the stairs. Fix any carpet that is loose or worn.  Avoid having throw rugs at the top or bottom of the stairs. If you do have throw rugs, attach them to the floor with carpet tape.  Make sure that you have a light switch at the top of the stairs and the bottom of the stairs. If you do not have them, ask someone to add them for you. What else can I do to help prevent falls?  Wear shoes that:  Do not have high heels.  Have rubber bottoms.  Are  comfortable and fit you well.  Are closed at the toe. Do not wear sandals.  If you use a stepladder:  Make sure that it is fully opened. Do not climb a closed stepladder.  Make sure that both sides of the stepladder are locked into place.  Ask someone to hold it for you, if possible.  Clearly mark and make sure that you can see:  Any grab bars or handrails.  First and last steps.  Where the edge of each step is.  Use tools that help you move around (mobility aids) if they are needed. These include:  Canes.  Walkers.  Scooters.  Crutches.  Turn on the lights when you go into a dark area. Replace any light bulbs as soon as they burn out.  Set up your furniture so you have a clear path. Avoid moving your furniture around.  If any of your floors are uneven, fix them.  If there are any pets around you, be aware of where they are.  Review your medicines with your doctor. Some medicines can make you feel dizzy. This can increase your chance of falling. Ask your doctor what other things that you can do to help prevent falls. This information is not intended to replace advice given to you by your health care provider. Make sure you discuss any questions you have with your health care provider. Document Released: 06/25/2009 Document Revised: 02/04/2016 Document Reviewed: 10/03/2014 Elsevier Interactive Patient Education  2017 [employer].

## 2019-06-27 NOTE — Progress Notes (Signed)
Subjective:   Allison Yoder is a 68 y.o. female who presents for Medicare Annual (Subsequent) preventive examination.  Review of Systems:    This visit is being conducted through telemedicine via telephone at the nurse health advisor's home address due to the COVID-19 pandemic. This patient has given me verbal consent via doximity to conduct this visit, patient states they are participating from their home address. Some vital signs may be absent or patient reported.    Patient identification: identified by name, DOB, and current address Patient and myself on the telephone call. There is no referral for this visit.   Cardiac Risk Factors include: advanced age (>46mn, >>4women);diabetes mellitus;dyslipidemia     Objective:     Vitals: Wt 169 lb (76.7 kg)   BMI 27.28 kg/m   Body mass index is 27.28 kg/m.  Advanced Directives 06/27/2019 12/01/2016 11/11/2016 01/25/2016 10/27/2015 10/15/2014 03/09/2012  Does Patient Have a Medical Advance Directive? No - Yes No Yes Yes Patient does not have advance directive  Type of Advance Directive - Living will;Healthcare Power of [place] - - [place] -  Does patient want to make changes to medical advance directive? - - - - Yes - information given - -  Copy of [place]? - - Yes - - - -  Would patient like information on creating a medical advance directive? Yes (MAU/Ambulatory/Procedural Areas - Information given) - - No - patient declined information - - -    Tobacco Social History   Tobacco Use  Smoking Status Former Smoker  . Packs/day: 2.00  . Years: 5.00  . Pack years: 10.00  . Types: Cigarettes  . Quit date: 09/12/1985  . Years since quitting: 33.8  Smokeless Tobacco Never Used     Counseling given: Not Answered   Clinical Intake:  Pre-visit preparation completed: Yes  Pain : 0-10 Pain Score: 4  Pain Type: Chronic pain Pain  Location: Neck Pain Descriptors / Indicators: Aching Pain Onset: More than a month ago Pain Frequency: Intermittent     Nutritional Risks: None Diabetes: Yes CBG done?: No Did pt. bring in CBG monitor from home?: No  How often do you need to have someone help you when you read instructions, pamphlets, or other written materials from your doctor or pharmacy?: 1 - Never What is the last grade level you completed in school?: bachelors  Interpreter Needed?: No  Information entered by :: CJohnson, LPN  Past Medical History:  Diagnosis Date  . Allergy   . Anemia    "all the time as a child"  . Anxiety   . Arthritis    "all my joints; worse in my hands" (10/27/2015)  . Chronic lower back pain   . Depression   . Diverticulosis   . Fatty liver   . GERD (gastroesophageal reflux disease)   . Heart murmur    "dx'd years ago; very mild; never treated" (10/27/2015)  . Hyperlipidemia   . Hypertension   . Hypothyroidism 2002-~ 2010  . [facility] . Migraines    "stopped when I went thru menopause"  . OSA on CPAP   . Pneumonia ~ 2013; 10/27/2015  . Recurrent urinary tract infection   . Routine general medical examination at a health care facility 02/26/2014  . Snores   . Stroke ([place]   . Type II diabetes mellitus ([place]    "lost weight;  started exercising again; don't have it anymore" (10/27/2015)  . Urinary incontinence   . Wears glasses    Past Surgical History:  Procedure Laterality Date  . BLEPHAROPLASTY Bilateral 2013; 2016  . BREAST LUMPECTOMY Right 1964   benign tumor,  . HEMORRHOID BANDING  X 2  . KNEE ARTHROSCOPY Left 2016   "meniscus repair"  . PELVIC FLOOR REPAIR  2001   "lift"  . POLYPECTOMY  X 3   "bladder"  . SHOULDER ARTHROSCOPY W/ ROTATOR CUFF REPAIR Left 2013  . SHOULDER ARTHROSCOPY W/ ROTATOR CUFF REPAIR Right 2015  . TUBAL LIGATION  ~ 1986  . VAGINAL HYSTERECTOMY  1992   Partial   Family History  Problem Relation Age of Onset   . Arthritis Mother   . Heart disease Mother   . Hyperlipidemia Mother   . Hypertension Mother   . Diabetes Mother   . Depression Mother   . Stroke Mother   . Alcohol abuse Father   . Lung cancer Father   . Arthritis Maternal Grandmother   . Stroke Maternal Grandmother   . Diabetes Maternal Grandmother   . Heart disease Paternal Uncle        x 2  . Arthritis Sister   . Breast cancer Cousin   . Heart disease Paternal Aunt   . Diabetes Paternal Uncle   . Diabetes Sister   . Heart disease Paternal Uncle        x 5  . Heart disease Paternal Aunt        x 3  . Stroke Paternal Aunt   . Dementia Paternal Grandmother   . Colon cancer Neg Hx    Social History   Socioeconomic History  . Marital status: Divorced    Spouse name: Not on file  . Number of children: 1  . Years of education: Not on file  . Highest education level: Not on file  Occupational History  . Occupation: Dental Receptionist/Assistant    Comment: Dr. Quillian Quince in [place]: [place]  . Financial resource strain: Not hard at all  . Food insecurity    Worry: Never true    Inability: Never true  . Transportation needs    Medical: No    Non-medical: No  Tobacco Use  . Smoking status: Former Smoker    Packs/day: 2.00    Years: 5.00    Pack years: 10.00    Types: Cigarettes    Quit date: 09/12/1985    Years since quitting: 33.8  . Smokeless tobacco: Never Used  Substance and Sexual Activity  . Alcohol use: Yes    Alcohol/week: 3.0 standard drinks    Types: 1 Glasses of wine, 2 Shots of liquor per week    Comment: weekends  . Drug use: No  . Sexual activity: Yes  Lifestyle  . Physical activity    Days per week: 0 days    Minutes per session: 0 min  . Stress: To some extent  Relationships  . Social Herbalist on phone: Not on file    Gets together: Not on file    Attends religious service: Not on file    Active member of club or organization: Not on  file    Attends meetings of clubs or organizations: Not on file    Relationship status: Not on file  Other Topics Concern  . Not on file  Social History Narrative   Education:  BA  Dental assistant   Divorced and lives with her brother.      Outpatient Encounter Medications as of 06/27/2019  Medication Sig  . albuterol (PROVENTIL HFA;VENTOLIN HFA) 108 (90 Base) MCG/ACT inhaler Inhale 1-2 puffs into the lungs every 6 (six) hours as needed for wheezing or shortness of breath.  Marland Kitchen atenolol (TENORMIN) 50 MG tablet TAKE 1 TABLET BY MOUTH  DAILY  . Blood Glucose Monitoring Suppl (ONE TOUCH ULTRA 2) w/Device KIT Check blood sugar once daily and as instructed. Dx E11.9  . citalopram (CELEXA) 20 MG tablet TAKE 1 TABLET BY MOUTH  DAILY  . clonazePAM (KLONOPIN) 0.5 MG tablet Take 1 tablet (0.5 mg total) by mouth daily as needed for anxiety.  . cyanocobalamin 1000 MCG tablet Take 1,000 mcg by mouth daily.  . cyclobenzaprine (FLEXERIL) 10 MG tablet Take 1 tablet (10 mg total) by mouth daily as needed for muscle spasms.  Marland Kitchen ezetimibe (ZETIA) 10 MG tablet Take 1 tablet (10 mg total) by mouth daily.  . fluticasone (FLONASE) 50 MCG/ACT nasal spray Place into both nostrils daily as needed for allergies or rhinitis.  Marland Kitchen glucose blood (ONE TOUCH ULTRA TEST) test strip Check blood sugar once daily and as instructed. Dx E11.9  . levocetirizine (XYZAL) 5 MG tablet Take 5 mg by mouth every evening.  Marland Kitchen lisinopril (ZESTRIL) 10 MG tablet TAKE 1 TABLET BY MOUTH AT  BEDTIME  . metFORMIN (GLUCOPHAGE) 500 MG tablet TAKE 1 TABLET BY MOUTH TWO  TIMES DAILY WITH A MEAL  . Multiple Vitamin (MULTIVITAMIN) tablet Take 1 tablet by mouth daily.  Marland Kitchen NITROFURANTOIN PO Take 100 mg by mouth daily as needed.   [name] DELICA LANCETS 65Y MISC Check blood sugar once daily and as instructed. Dx E11.9  . Vitamin D, Cholecalciferol, 1000 units CAPS Take 3,000 Units by mouth daily.  Junita Push MT Use as directed in the mouth or throat.    No facility-administered encounter medications on file as of 06/27/2019.     Activities of Daily Living In your present state of health, do you have any difficulty performing the following activities: 06/27/2019  Hearing? Y  Comment needs hearing aids  Vision? N  Difficulty concentrating or making decisions? N  Walking or climbing stairs? N  Dressing or bathing? N  Doing errands, shopping? N  Preparing Food and eating ? N  Using the Toilet? N  In the past six months, have you accidently leaked urine? N  Do you have problems with loss of bowel control? N  Managing your Medications? N  Managing your Finances? N  Housekeeping or managing your Housekeeping? N  Some recent data might be hidden    Patient Care Team: Tonia Ghent, MD as PCP - General (Family Medicine) Josue Hector, MD as PCP - Cardiology (Cardiology)    Assessment:   This is a routine wellness examination for [employer].  Exercise Activities and Dietary recommendations Current Exercise Habits: The patient does not participate in regular exercise at present, Exercise limited by: None identified  Goals    . Increase physical activity     Starting 11/11/2016, I will continue to exercise at least 45 min 4 days per week.     . Patient Stated     06/27/2019, I will exercise more and eat a healthier diet.        Fall Risk Fall Risk  06/27/2019 06/08/2018 11/11/2016 04/08/2016  Falls in the past year? 0 No No No  Risk for fall due to :  Medication side effect - - -  Follow up Falls evaluation completed;Falls prevention discussed - - -   Is the patient's home free of loose throw rugs in walkways, pet beds, electrical cords, etc?   yes      Grab bars in the bathroom? yes      Handrails on the stairs?   yes      Adequate lighting?   yes  Timed Get Up and Go performed: N/A  Depression Screen PHQ 2/9 Scores 06/27/2019 06/08/2018 11/11/2016 11/11/2016  PHQ - 2 Score 0 0 2 2  PHQ- 9 Score 0 - 13 13     Cognitive  Function MMSE - Mini Mental State Exam 06/27/2019 11/11/2016  Orientation to time 5 5  Orientation to Place 5 5  Registration 3 3  Attention/ Calculation 5 0  Recall 3 3  Language- name 2 objects - 0  Language- repeat 1 1  Language- follow 3 step command - 3  Language- read & follow direction - 0  Write a sentence - 0  Copy design - 0  Total score - 20  Mini Cog  Mini-Cog screen was completed. Maximum score is 22. A value of 0 denotes this part of the MMSE was not completed or the patient failed this part of the Mini-Cog screening.      Immunization History  Administered Date(s) Administered  . Fluad Quad(high Dose 65+) 05/31/2019  . Hepatitis B, adult 07/19/2013, 08/27/2013, 04/04/2014  . Influenza, High Dose Seasonal PF 08/01/2018  . Influenza,inj,Quad PF,6+ Mos 07/25/2014  . Influenza-Unspecified 05/14/2015, 05/13/2016, 06/08/2017  . Pneumococcal Conjugate-13 02/26/2014  . Pneumococcal Polysaccharide-23 05/06/2016  . Tdap 06/25/2012  . Zoster 09/10/2012    Qualifies for Shingles Vaccine? yes  Screening Tests Health Maintenance  Topic Date Due  . FOOT EXAM  06/09/2019  . HEMOGLOBIN A1C  11/28/2019  . OPHTHALMOLOGY EXAM  02/07/2020  . MAMMOGRAM  12/11/2020  . TETANUS/TDAP  06/25/2022  . COLONOSCOPY  04/16/2025  . INFLUENZA VACCINE  Completed  . DEXA SCAN  Completed  . Hepatitis C Screening  Completed  . PNA vac Low Risk Adult  Completed    Cancer Screenings: Lung: Low Dose CT Chest recommended if Age 44-80 years, 30 pack-year currently smoking OR have quit w/in 15years. Patient does not qualify. Breast:  Up to date on Mammogram? Yes, completed 12/2018 per patient    Up to date of Bone Density/Dexa? Yes, completed 09/12/2016 Colorectal: completed 04/17/2015  Additional Screenings:  Hepatitis C Screening: 04/28/2015     Plan:    Patient will exercise more and eat a healthier diet.    I have personally reviewed and noted the following in the patient's chart:   .  Medical and social history . Use of alcohol, tobacco or illicit drugs  . Current medications and supplements . Functional ability and status . Nutritional status . Physical activity . Advanced directives . List of other physicians . Hospitalizations, surgeries, and ER visits in previous 12 months . Vitals . Screenings to include cognitive, depression, and falls . Referrals and appointments  In addition, I have reviewed and discussed with patient certain preventive protocols, quality metrics, and best practice recommendations. A written personalized care plan for preventive services as well as general preventive health recommendations were provided to patient.     Andrez Grime, LPN  [id number]

## 2019-06-27 NOTE — Progress Notes (Signed)
PCP notes: none  Health Maintenance: Declined Shingrix at this time.     Abnormal Screenings: none    Patient concerns: Patient would like a copy of the advance directives at her physical tomorrow. Patient wants to discuss the recall on Metformin.     Nurse concerns: none    Next PCP appt.: 06/28/2019 @ 12 pm

## 2019-06-28 ENCOUNTER — Encounter: Payer: Medicare Other | Admitting: Family Medicine

## 2019-06-28 ENCOUNTER — Ambulatory Visit: Payer: Medicare Other

## 2019-07-03 ENCOUNTER — Other Ambulatory Visit: Payer: Self-pay | Admitting: Family Medicine

## 2019-07-11 ENCOUNTER — Other Ambulatory Visit: Payer: Medicare Other

## 2019-07-15 ENCOUNTER — Other Ambulatory Visit: Payer: Self-pay | Admitting: Family Medicine

## 2019-07-15 NOTE — Telephone Encounter (Signed)
Please sign and close encounter if completed.  

## 2019-07-19 ENCOUNTER — Other Ambulatory Visit: Payer: Self-pay

## 2019-07-19 ENCOUNTER — Ambulatory Visit (INDEPENDENT_AMBULATORY_CARE_PROVIDER_SITE_OTHER): Payer: Medicare Other | Admitting: Family Medicine

## 2019-07-19 ENCOUNTER — Telehealth: Payer: Self-pay | Admitting: Family Medicine

## 2019-07-19 ENCOUNTER — Encounter: Payer: Self-pay | Admitting: Family Medicine

## 2019-07-19 DIAGNOSIS — E119 Type 2 diabetes mellitus without complications: Secondary | ICD-10-CM

## 2019-07-19 DIAGNOSIS — Z7189 Other specified counseling: Secondary | ICD-10-CM

## 2019-07-19 DIAGNOSIS — M2550 Pain in unspecified joint: Secondary | ICD-10-CM | POA: Diagnosis not present

## 2019-07-19 DIAGNOSIS — G4733 Obstructive sleep apnea (adult) (pediatric): Secondary | ICD-10-CM | POA: Diagnosis not present

## 2019-07-19 DIAGNOSIS — E782 Mixed hyperlipidemia: Secondary | ICD-10-CM | POA: Diagnosis not present

## 2019-07-19 DIAGNOSIS — R05 Cough: Secondary | ICD-10-CM

## 2019-07-19 DIAGNOSIS — Z20822 Contact with and (suspected) exposure to covid-19: Secondary | ICD-10-CM

## 2019-07-19 DIAGNOSIS — I10 Essential (primary) hypertension: Secondary | ICD-10-CM | POA: Diagnosis not present

## 2019-07-19 DIAGNOSIS — Z0000 Encounter for general adult medical examination without abnormal findings: Secondary | ICD-10-CM

## 2019-07-19 DIAGNOSIS — R059 Cough, unspecified: Secondary | ICD-10-CM

## 2019-07-19 MED ORDER — ALBUTEROL SULFATE HFA 108 (90 BASE) MCG/ACT IN AERS: 1.0000 | INHALATION_SPRAY | Freq: Four times a day (QID) | RESPIRATORY_TRACT | 2 refills | Status: DC | PRN

## 2019-07-19 NOTE — Telephone Encounter (Signed)
Please call pt.  She needs COVID testing in [place], please help set that up.  I put in the order.  Please mail her a copy of advance directive packet per her request.  Thanks.

## 2019-07-19 NOTE — Telephone Encounter (Signed)
Left detailed message on voicemail.   Mailed advance directive packet.

## 2019-07-19 NOTE — Progress Notes (Signed)
Virtual visit completed through WebEx or similar program Patient location: home  Provider location: Financial controller at [facility], office   Pandemic considerations d/w pt.   Limitations and rationale for visit method d/w patient.  Patient agreed to proceed.   CC: follow up.   HPI:  Diabetes:  Using medications without difficulties:yes Hypoglycemic episodes:no Hyperglycemic episodes:no Feet problems:no Blood Sugars averaging: 180s in the AMs, much lower in the PMs.   eye exam within last year: yes Discussed metformin recall but she shouldn't be affected since she didn't have XR formulation.    Elevated Cholesterol: She feels better off crestor.  She still has aches with f/u pending at [facility] re: rheum appointment.  She has some paresthesias in the R 1st-4th fingers.  She has some medial L elbow pain.  She may be compressing her elbow at night and that may contribute, d/w pt.  She has trouble with grip from arthritis.    Still using CPAP at night, with relief.  Compliant.  Used nightly.  Sleeping better with use.    Hypertension:    Using medication without problems or lightheadedness: rarely lightheaded, cautions d/w pt.  Noted only with sudden position changes Chest pain with exertion:no Edema:no Short of breath:no  She has cough with clear sputum.  No fevers.  Recently noted.  Not SOB.  Some wheeze, but hasn't used SABA yet.    Flu UTD Shingles 2013 PNA up-to-date Tetanus 2013 Colonoscopy 2016 Breast cancer screening prev done.  Bone density testing pending per gynecology clinic.  Advance directive-sister Debbie to be designated if patient were incapacitated.  BP today 136/82 p76 today.   Meds and allergies reviewed.   ROS: Per HPI unless specifically indicated in ROS section   NAD Speech wnl  A/P:  Diabetes:  Discussed metformin recall but she shouldn't be affected since she didn't have XR formulation.    We will plan on recheck A1c after she has f/u at [facility] rheum  clinic.   Hyperlipidemia. She feels better off crestor.  She still has aches with f/u pending at [facility] re: rheum appointment.  I would continue off Crestor for now.  She agrees.  Joint aches.She still has aches with f/u pending at [facility] re: rheum appointment.  She has some paresthesias in the R 1st-4th fingers.  She has some medial L elbow pain.  She may be compressing her elbow at night and that may contribute, d/w pt. I will await rheumatology input.  Still using CPAP at night, with relief.  Compliant.  Used nightly.  Sleeping better with use.    Hypertension:    Continue as is for now.  Update me as needed.  She agrees.  She has cough with clear sputum.  No fevers.  Recently noted.  Not SOB.  Some wheeze, but hasn't used SABA yet.  Needs covid testing.  We will get this set up.  See following phone note.  Routine cautions given.  She agrees  Flu UTD Shingles 2013 PNA up-to-date  Tetanus 2013 Colonoscopy 2016 Breast cancer screening prev done.  Bone density testing pending per gynecology clinic.  Advance directive-sister Debbie to be designated if patient were incapacitated.

## 2019-07-21 LAB — NOVEL CORONAVIRUS, NAA: SARS-CoV-2, NAA: NOT DETECTED

## 2019-07-22 ENCOUNTER — Encounter: Payer: Self-pay | Admitting: Family Medicine

## 2019-07-22 ENCOUNTER — Telehealth: Payer: Self-pay | Admitting: Family Medicine

## 2019-07-22 DIAGNOSIS — M2550 Pain in unspecified joint: Secondary | ICD-10-CM | POA: Insufficient documentation

## 2019-07-22 DIAGNOSIS — R059 Cough, unspecified: Secondary | ICD-10-CM | POA: Insufficient documentation

## 2019-07-22 DIAGNOSIS — R05 Cough: Secondary | ICD-10-CM | POA: Insufficient documentation

## 2019-07-22 DIAGNOSIS — Z0000 Encounter for general adult medical examination without abnormal findings: Secondary | ICD-10-CM | POA: Insufficient documentation

## 2019-07-22 NOTE — Assessment & Plan Note (Signed)
Flu UTD Shingles 2013 PNA up-to-date Tetanus 2013 Colonoscopy 2016 Breast cancer screening prev done.  Bone density testing pending per gynecology clinic.  Advance directive-sister Debbie to be designated if patient were incapacitated.

## 2019-07-22 NOTE — Assessment & Plan Note (Signed)
Advance directive-sister Debbie to be designated if patient were incapacitated.

## 2019-07-22 NOTE — Assessment & Plan Note (Signed)
She feels better off crestor.  She still has aches with f/u pending at [facility] re: rheum appointment.  I would continue off Crestor for now.  She agrees.

## 2019-07-22 NOTE — Assessment & Plan Note (Signed)
Joint aches.She still has aches with f/u pending at [facility] re: rheum appointment.  She has some paresthesias in the R 1st-4th fingers.  She has some medial L elbow pain.  She may be compressing her elbow at night and that may contribute, d/w pt. I will await rheumatology input.

## 2019-07-22 NOTE — Assessment & Plan Note (Signed)
Continue as is for now.  Update me as needed.  She agrees.

## 2019-07-22 NOTE — Telephone Encounter (Signed)
Negative COVID results given. Patient results "NOT Detected." Caller expressed understanding. ° °

## 2019-07-22 NOTE — Assessment & Plan Note (Signed)
Still using CPAP at night, with relief.  Compliant.  Used nightly.  Sleeping better with use.

## 2019-07-22 NOTE — Assessment & Plan Note (Signed)
She has cough with clear sputum.  No fevers.  Recently noted.  Not SOB.  Some wheeze, but hasn't used SABA yet.  Needs covid testing.  We will get this set up.  See following phone note.  Routine cautions given.  She agrees

## 2019-07-22 NOTE — Assessment & Plan Note (Signed)
Discussed metformin recall but she shouldn't be affected since she didn't have XR formulation.    We will plan on recheck A1c after she has f/u at [facility] rheum clinic.

## 2019-07-23 ENCOUNTER — Telehealth: Payer: Self-pay | Admitting: *Deleted

## 2019-07-23 MED ORDER — BENZONATATE 200 MG PO CAPS: 200.0000 mg | ORAL_CAPSULE | Freq: Three times a day (TID) | ORAL | 1 refills | Status: DC | PRN

## 2019-07-23 NOTE — Telephone Encounter (Signed)
Sent tessalon. Thanks.  Will await update from patient.

## 2019-07-23 NOTE — Telephone Encounter (Signed)
Patient would like Allison Yoder sent to pharmacy and will try the inhaler for the cough.  Patient says she didn't think of using the inhaler for the cough.  No fever, no SOB.  Patient will call tomorrow or Thursday if no better.  Patient says she is able to cough up phlegm.

## 2019-07-23 NOTE — Telephone Encounter (Addendum)
Please see if she has used albuterol for the cough and see if that helped.  Did tessalon help prev?  If either/both/neither helped, then let me know.    Assuming she isn't SOB or febrile the we may be able to treat her symptomatically.  Thanks.

## 2019-07-23 NOTE — Telephone Encounter (Signed)
Patient was advised that her Covid test was negative and says she is not doing too well.  She has extreme congestion and cough.  She has no fever and hasn't had fever at all, no SOB.  Patient is taking Theraflu that is a multi-symptom medication and she says it helps some but the cough is bad, especially at night.  Please advise.

## 2019-07-23 NOTE — Addendum Note (Signed)
Addended by: Tonia Ghent on: 07/23/2019 04:55 PM   Modules accepted: Orders

## 2019-07-29 ENCOUNTER — Telehealth: Payer: Self-pay | Admitting: Family Medicine

## 2019-07-29 ENCOUNTER — Ambulatory Visit (INDEPENDENT_AMBULATORY_CARE_PROVIDER_SITE_OTHER): Payer: Medicare Other | Admitting: Family Medicine

## 2019-07-29 DIAGNOSIS — K5792 Diverticulitis of intestine, part unspecified, without perforation or abscess without bleeding: Secondary | ICD-10-CM

## 2019-07-29 MED ORDER — AMOXICILLIN-POT CLAVULANATE 875-125 MG PO TABS: 1.0000 | ORAL_TABLET | Freq: Two times a day (BID) | ORAL | 0 refills | Status: DC

## 2019-07-29 NOTE — Telephone Encounter (Signed)
Pt called stating she is having a flare up of diverticulosis and wanted to know if dr Damita Dunnings would call in her a rx for augmentin  [facility]  She stated she went to urgent care last night but they were closed. She had a 100.9 fever last night.  No fever today  She has been on liquid diet since yesterday am
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
## 2019-07-29 NOTE — Telephone Encounter (Signed)
[facility] Night - Client TELEPHONE ADVICE RECORD [facility] Patient Name: Allison Yoder Gender: Female DOB: 27-Dec-1950 Age: 68 Y 71 D Return Phone Number: [phone] (Primary), [phone] (Secondary) Address[place] [address] Client Kenneth Night - Client Client Site [place] Physician Renford Dills - MD Contact Type Call Who Is Calling Patient / Member / Family / Caregiver Call Type Triage / Clinical Relationship To Patient Self Return Phone Number [phone] (Primary) Chief Complaint Abdominal Pain Reason for Call Symptomatic / Request for Treasure states she has a temperature of 100.9 (temporal) with a cold all last week. States cold is a lot better today. States cough has set off her diverticulitis. States extreme sore left side and some pain. Stated it will need surgical management in the future but she would rather have it done electively. States she needs an Rx for Augmentin until she can be seen. Translation No Nurse Assessment Nurse: Louie Boston, RN, Barnetta Chapel Date/Time (Eastern Time): 07/28/2019 7:04:46 PM Confirm and document reason for call. If symptomatic, describe symptoms. ---Caller states she is having left lower abdomen pain/soreness, its the diverticulitis again. pain is not unbearable or bother her that much unless she touches it. loose stools all this week. she has a cold and coughing like crazy but much better today-white phlegm today. pain started yesterday. temp 100.9 forehead. sinus drainage. tested negative for covid. Has the patient had close contact with a person known or suspected to have the novel coronavirus illness OR traveled / lives in area with major community spread (including international travel) in the last 14 days from the onset of symptoms? * If Asymptomatic, screen for exposure and travel within  the last 14 days. ---No Does the patient have any new or worsening symptoms? ---Yes Will a triage be completed? ---Yes Related visit to physician within the last 2 weeks? ---No Does the PT have any chronic conditions? (i.e. diabetes, asthma, this includes High risk factors for pregnancy, etc.) ---Yes List chronic conditions. ---pneumonia 2016, diverticulitis, type 2 diabetes, htn, arthritis Is this a behavioral health or substance abuse call? ---No PLEASE NOTE: All timestamps contained within this report are represented as [place] Standard Time. CONFIDENTIALTY NOTICE: This fax transmission is intended only for the addressee. It contains information that is legally privileged, confidential or otherwise protected from use or disclosure. If you are not the intended recipient, you are strictly prohibited from reviewing, disclosing, copying using or disseminating any of this information or taking any action in reliance on or regarding this information. If you have received this fax in error, please notify us immediately by telephone so that we can arrange for its return to [place]. Phone: [phone], Toll-Free: [phone], Fax: [phone] Page: 2 of 2 Call Id: [id number] Guidelines Guideline Title Affirmed Question Affirmed Notes Nurse Date/Time Eilene Ghazi Time) Abdominal Pain - Female [1] Fever > 100.0 F (37.8 C) AND [2] diabetes mellitus or weak immune system (e.g., HIV positive, cancer chemo, splenectomy, organ transplant, chronic steroids) Urcheck, RN, Barnetta Chapel 07/28/2019 7:11:04 PM Disp. Time Eilene Ghazi Time) Disposition Final User 07/28/2019 7:16:16 PM See HCP within 4 Hours (or PCP triage) Yes Louie Boston, RN, Ledell Noss Disagree/Comply Comply Caller Understands Yes PreDisposition InappropriateToAsk Care Advice Given Per Guideline SEE HCP WITHIN 4 HOURS (OR PCP TRIAGE): * IF OFFICE WILL BE CLOSED AND NO PCP (PRIMARY CARE PROVIDER) SECOND-LEVEL TRIAGE: You need to be seen within  the next 3 or 4 hours. A nearby  Urgent [facility]) is often a good source of care. Another choice is to go to the ED. Go sooner if you become worse. CARE ADVICE given per Abdominal Pain, Female (Adult) guideline. CALL BACK IF: * You become worse. Comments User: Debbora Presto, RN Date/Time Eilene Ghazi Time): 07/28/2019 7:09:40 PM video visit 2 weeks ago for the cough. advised tessalon and inhaler User: Debbora Presto, RN Date/Time Eilene Ghazi Time): 07/28/2019 7:11:23 PM feels better today than she has in a week Referrals [facility] [place] [facility]

## 2019-07-29 NOTE — Telephone Encounter (Signed)
I don't disagree with the patient but first please triage patient and let me know.  We may be able to handle this via this phone note.  Thanks.

## 2019-07-29 NOTE — Telephone Encounter (Signed)
Pt went to UC but closed and ED too busy. Pt has been on liquid diet since 07/28/19. T now 98.4; diarrhea or loose stool last wk. Pt has sharp tenderness in lt side below waist line; tenderness is not as bad as yesterday. Pt has muscle and joint pain all the time. Dry cough,last wk lost taste and smell but has regained both this wk. Runny nose and S/T, and wheezing (inhaler helped) last wk. Pt still has head and chest congestion and sinus drainage. Pt does not want to go to UC and pt scheduled virtual visit 07/29/19 at 2 PM. UC & ED precautions given and pt voiced understanding. Pt will go ahead and get vitals for CMA call.

## 2019-07-29 NOTE — Telephone Encounter (Signed)
Noted. Thanks.

## 2019-07-31 NOTE — Assessment & Plan Note (Signed)
abd pain, presumed diverticulitis.  Will start augmentin and continue liquid diet.  If worse, then to ER.  She agrees.  She'll update me as needed.    Will stop nitrofurantion in the meantime.   Still okay for outpatient follow-up.

## 2019-08-05 ENCOUNTER — Encounter: Payer: Self-pay | Admitting: Family Medicine

## 2019-08-06 ENCOUNTER — Other Ambulatory Visit: Payer: Self-pay | Admitting: Family Medicine

## 2019-08-06 MED ORDER — AMOXICILLIN-POT CLAVULANATE 875-125 MG PO TABS: 1.0000 | ORAL_TABLET | Freq: Two times a day (BID) | ORAL | 0 refills | Status: DC

## 2019-08-06 MED ORDER — METOPROLOL SUCCINATE ER 50 MG PO TB24: 50.0000 mg | ORAL_TABLET | Freq: Every day | ORAL | 3 refills | Status: DC

## 2019-09-03 ENCOUNTER — Other Ambulatory Visit: Payer: Self-pay

## 2019-09-03 ENCOUNTER — Encounter: Payer: Self-pay | Admitting: Family Medicine

## 2019-09-03 ENCOUNTER — Ambulatory Visit (INDEPENDENT_AMBULATORY_CARE_PROVIDER_SITE_OTHER): Payer: Medicare Other | Admitting: Family Medicine

## 2019-09-03 VITALS — BP 130/74 | HR 61 | Temp 96.5°F | Ht 66.0 in | Wt 170.1 lb

## 2019-09-03 DIAGNOSIS — F419 Anxiety disorder, unspecified: Secondary | ICD-10-CM | POA: Diagnosis not present

## 2019-09-03 DIAGNOSIS — K5792 Diverticulitis of intestine, part unspecified, without perforation or abscess without bleeding: Secondary | ICD-10-CM

## 2019-09-03 DIAGNOSIS — E782 Mixed hyperlipidemia: Secondary | ICD-10-CM

## 2019-09-03 DIAGNOSIS — E119 Type 2 diabetes mellitus without complications: Secondary | ICD-10-CM

## 2019-09-03 DIAGNOSIS — S39012A Strain of muscle, fascia and tendon of lower back, initial encounter: Secondary | ICD-10-CM

## 2019-09-03 LAB — POCT GLYCOSYLATED HEMOGLOBIN (HGB A1C): Hemoglobin A1C: 7.5 % — AB (ref 4.0–5.6)

## 2019-09-03 MED ORDER — CLONAZEPAM 0.5 MG PO TABS: 0.5000 mg | ORAL_TABLET | Freq: Every day | ORAL | 1 refills | Status: DC | PRN

## 2019-09-03 MED ORDER — AMOXICILLIN-POT CLAVULANATE 875-125 MG PO TABS: 1.0000 | ORAL_TABLET | Freq: Two times a day (BID) | ORAL | 0 refills | Status: DC

## 2019-09-03 MED ORDER — CYCLOBENZAPRINE HCL 10 MG PO TABS: 10.0000 mg | ORAL_TABLET | Freq: Every day | ORAL | 3 refills | Status: AC | PRN

## 2019-09-03 NOTE — Patient Instructions (Addendum)
Recheck in about 4 months.  A1c at the visit.  Thanks for your effort.  Update me as needed.  Take care.  Glad to see you.

## 2019-09-03 NOTE — Progress Notes (Signed)
This visit occurred during the SARS-CoV-2 public health emergency.  Safety protocols were in place, including screening questions prior to the visit, additional usage of staff PPE, and extensive cleaning of exam room while observing appropriate contact time as indicated for disinfecting solutions.  Diabetes:  Using medications without difficulties: yes Hypoglycemic episodes: rare, cautions d/w pt.   Hyperglycemic episodes:no Feet problems:no tingling but she has OA at baseline with f/u pending.  Blood Sugars averaging: usually ~120-130s, occ higher eye exam within last year: yes A1c d/w pt.  Improved.   Stressors d/w pt.  Has used klonopin prn.  D/w pt.   Her son was living at home and she had to get him evicted.  Patient has been talking to her attorney.    Rx printed for augmentin in case of another episode with diverticulitis.    She clearly feels better off statin.   Meds, vitals, and allergies reviewed.  ROS: Per HPI unless specifically indicated in ROS section   GEN: nad, alert and oriented HEENT: ncat NECK: supple w/o LA CV: rrr. PULM: ctab, no inc wob EXT: no edema SKIN: no acute rash  Diabetic foot exam: Normal inspection No skin breakdown No calluses  Normal DP pulses Normal sensation to light touch and monofilament Nails normal

## 2019-09-04 NOTE — Assessment & Plan Note (Signed)
She clearly feels better off statin.  Continue off statin for now.  She agrees.

## 2019-09-04 NOTE — Assessment & Plan Note (Signed)
No symptoms currently.  Routine cautions given to patient Rx printed for augmentin in case of another episode with diverticulitis.  She will hold the prescription for now.

## 2019-09-04 NOTE — Assessment & Plan Note (Signed)
A1c d/w pt.  Improved.  Recheck periodically.  No change in meds at this point.  She agrees.  See after visit summary.

## 2019-09-04 NOTE — Assessment & Plan Note (Signed)
Stressors d/w pt.  Has used klonopin prn.  Continue as needed use.  D/w pt.   Her son was living at home and she had to get him evicted.  Patient has been talking to her attorney.   She is still safe at home.  Okay for outpatient follow-up.

## 2019-09-15 ENCOUNTER — Other Ambulatory Visit: Payer: Self-pay | Admitting: Family Medicine

## 2019-09-30 ENCOUNTER — Other Ambulatory Visit: Payer: Self-pay | Admitting: Family Medicine

## 2019-10-22 ENCOUNTER — Encounter: Payer: Self-pay | Admitting: Family Medicine

## 2019-10-27 ENCOUNTER — Other Ambulatory Visit: Payer: Self-pay | Admitting: Family Medicine

## 2019-11-04 DIAGNOSIS — M47812 Spondylosis without myelopathy or radiculopathy, cervical region: Secondary | ICD-10-CM | POA: Diagnosis not present

## 2019-11-04 DIAGNOSIS — M19041 Primary osteoarthritis, right hand: Secondary | ICD-10-CM | POA: Diagnosis not present

## 2019-11-04 DIAGNOSIS — M19042 Primary osteoarthritis, left hand: Secondary | ICD-10-CM | POA: Diagnosis not present

## 2019-11-04 DIAGNOSIS — M47816 Spondylosis without myelopathy or radiculopathy, lumbar region: Secondary | ICD-10-CM | POA: Diagnosis not present

## 2019-11-04 DIAGNOSIS — G8929 Other chronic pain: Secondary | ICD-10-CM | POA: Diagnosis not present

## 2019-11-04 DIAGNOSIS — M545 Low back pain: Secondary | ICD-10-CM | POA: Diagnosis not present

## 2019-11-04 DIAGNOSIS — M542 Cervicalgia: Secondary | ICD-10-CM | POA: Diagnosis not present

## 2019-11-12 ENCOUNTER — Encounter: Payer: Self-pay | Admitting: Family Medicine

## 2019-11-15 ENCOUNTER — Encounter: Payer: Self-pay | Admitting: Family Medicine

## 2019-11-15 ENCOUNTER — Telehealth: Payer: Self-pay | Admitting: Radiology

## 2019-11-15 NOTE — Telephone Encounter (Signed)
See my chart message.  Can she get a CD burned with her images?  Please let her know.  Thanks.

## 2019-11-15 NOTE — Telephone Encounter (Signed)
LVM for patient to call about x rays. Please send call to x ray.

## 2019-12-09 ENCOUNTER — Telehealth: Payer: Self-pay | Admitting: Pharmacist

## 2019-12-09 NOTE — Telephone Encounter (Signed)
Called patient and left voice mail. Calling to schedule appointment in lipid clinic per Dr. Johnsie Cancel. Patient previously on rosuvastatin 5mg  and not tolerating per PCP. Requesting to see in lipid clinic again to discuss alternatives.

## 2019-12-09 NOTE — Progress Notes (Deleted)
Cardiology Office Note   Date:  12/09/2019   ID:  Allison Yoder, DOB 05/11/1951, MRN [id number]  PCP:  Tonia Ghent, MD  Cardiologist:   Jenkins Rouge, MD   No chief complaint on file.     History of Present Illness:  69 y.o. chest pain 12/01/16.  Normal ETT 12/14/16 Calcium score elevated 172 on statin Also history of HTN and DM  Echo from 01/26/16 normal EF 65-70% done for TIA Carotid 5/17  Plaque RICA no stenosis   CVA 01/2016 lacunar with small area of acute infarct in left amygdala and tail of hippocampus   She has a grand-baby in [place] named Jaxson   LDL is elevated  She was intolerant to higher dose lipitor and note from pharm D indicated changing to low Dose crestor with her Zetia However note from primary indicates she is not on statin and feels much better  ***  **  Past Medical History:  Diagnosis Date  . Allergy   . Anemia    "all the time as a child"  . Anxiety   . Arthritis    "all my joints; worse in my hands" (10/27/2015)  . Chronic lower back pain   . Depression   . Diverticulosis   . Fatty liver   . GERD (gastroesophageal reflux disease)   . Heart murmur    "dx'd years ago; very mild; never treated" (10/27/2015)  . Hyperlipidemia   . Hypertension   . Hypothyroidism 2002-~ 2010  . Iritis    per [facility]  . Migraines    "stopped when I went thru menopause"  . OSA on CPAP   . Pneumonia ~ 2013; 10/27/2015  . Recurrent urinary tract infection   . Routine general medical examination at a health care facility 02/26/2014  . Snores   . Stroke ([place])   . Type II diabetes mellitus ([place])    "lost weight; started exercising again; don't have it anymore" (10/27/2015)  . Urinary incontinence   . Wears glasses     Past Surgical History:  Procedure Laterality Date  . BLEPHAROPLASTY Bilateral 2013; 2016  . BREAST LUMPECTOMY Right 1964   benign tumor,  . HEMORRHOID BANDING  X 2  . KNEE ARTHROSCOPY Left 2016   "meniscus repair"  .  PELVIC FLOOR REPAIR  2001   "lift"  . POLYPECTOMY  X 3   "bladder"  . SHOULDER ARTHROSCOPY W/ ROTATOR CUFF REPAIR Left 2013  . SHOULDER ARTHROSCOPY W/ ROTATOR CUFF REPAIR Right 2015  . TUBAL LIGATION  ~ 1986  . VAGINAL HYSTERECTOMY  1992   Partial     Current Outpatient Medications  Medication Sig Dispense Refill  . albuterol (VENTOLIN HFA) 108 (90 Base) MCG/ACT inhaler Inhale 1-2 puffs into the lungs every 6 (six) hours as needed for wheezing or shortness of breath. 18 g 2  . amoxicillin-clavulanate (AUGMENTIN) 875-125 MG tablet Take 1 tablet by mouth 2 (two) times daily. 20 tablet 0  . Blood Glucose Monitoring Suppl (ONE TOUCH ULTRA 2) w/Device KIT Check blood sugar once daily and as instructed. Dx E11.9 1 each 0  . citalopram (CELEXA) 20 MG tablet TAKE 1 TABLET BY MOUTH  DAILY 90 tablet 3  . clonazePAM (KLONOPIN) 0.5 MG tablet Take 1 tablet (0.5 mg total) by mouth daily as needed for anxiety. 30 tablet 1  . cyanocobalamin 1000 MCG tablet Take 1,000 mcg by mouth daily.    . cyclobenzaprine (FLEXERIL) 10 MG tablet Take 1 tablet (  10 mg total) by mouth daily as needed for muscle spasms. 90 tablet 3  . ezetimibe (ZETIA) 10 MG tablet TAKE 1 TABLET BY MOUTH ONCE DAILY 90 tablet 3  . fluticasone (FLONASE) 50 MCG/ACT nasal spray Place into both nostrils daily as needed for allergies or rhinitis.    Marland Kitchen glucose blood (ONE TOUCH ULTRA TEST) test strip Check blood sugar once daily and as instructed. Dx E11.9 100 each 1  . levocetirizine (XYZAL) 5 MG tablet Take 5 mg by mouth every evening.    Marland Kitchen lisinopril (ZESTRIL) 10 MG tablet TAKE 1 TABLET BY MOUTH AT  BEDTIME 90 tablet 0  . metFORMIN (GLUCOPHAGE) 500 MG tablet TAKE 1 TABLET BY MOUTH  TWICE DAILY WITH A MEAL 180 tablet 1  . metoprolol succinate (TOPROL-XL) 50 MG 24 hr tablet Take 1 tablet (50 mg total) by mouth daily. Take with or immediately following a meal. 90 tablet 3  . Multiple Vitamin (MULTIVITAMIN) tablet Take 1 tablet by mouth daily.     Marland Kitchen NITROFURANTOIN PO Take 100 mg by mouth daily as needed.     [name] DELICA LANCETS 83E MISC Check blood sugar once daily and as instructed. Dx E11.9 100 each 1  . Vitamin D, Cholecalciferol, 1000 units CAPS Take 3,000 Units by mouth daily.     No current facility-administered medications for this visit.    Allergies:   Cephalexin, Codeine, Inapsine [droperidol], Crestor [rosuvastatin], Lipitor [atorvastatin calcium], Niacin and related, Pravastatin, and Adhesive [tape]    Social History:  The patient  reports that she quit smoking about 34 years ago. Her smoking use included cigarettes. She has a 10.00 pack-year smoking history. She has never used smokeless tobacco. She reports current alcohol use of about 3.0 standard drinks of alcohol per week. She reports that she does not use drugs.   Family History:  The patient's family history includes Alcohol abuse in her father; Arthritis in her maternal grandmother, mother, and sister; Breast cancer in her cousin; Dementia in her paternal grandmother; Depression in her mother; Diabetes in her maternal grandmother, mother, paternal uncle, and sister; Heart disease in her mother, paternal aunt, paternal aunt, paternal uncle, and paternal uncle; Hyperlipidemia in her mother; Hypertension in her mother; Lung cancer in her father; Stroke in her maternal grandmother, mother, and paternal aunt.    ROS:  Please see the history of present illness.   Otherwise, review of systems are positive for none.   All other systems are reviewed and negative.    PHYSICAL EXAM: VS:  There were no vitals taken for this visit. , BMI There is no height or weight on file to calculate BMI. Affect appropriate Healthy:  appears stated age 1: normal Neck supple with no adenopathy JVP normal no bruits no thyromegaly Lungs clear with no wheezing and good diaphragmatic motion Heart:  S1/S2 no murmur, no rub, gallop or click PMI normal Abdomen: benighn, BS positve, no  tenderness, no AAA no bruit.  No HSM or HJR Distal pulses intact with no bruits No edema Neuro non-focal Skin warm and dry No muscular weakness   EKG:  NSR normal ECG  11/20/17    Recent Labs: 05/31/2019: ALT 34; BUN 16; Creatinine, Ser 0.75; Hemoglobin 13.9; Platelets 212.0; Potassium 4.8; Sodium 139; TSH 2.95    Lipid Panel    Component Value Date/Time   CHOL 118 05/31/2019 0928   CHOL 110 04/23/2019 0741   TRIG 153.0 (H) 05/31/2019 0928   HDL 35.40 (L) 05/31/2019 9407  HDL 32 (L) 04/23/2019 0741   CHOLHDL 3 05/31/2019 0928   VLDL 30.6 05/31/2019 0928   LDLCALC 52 05/31/2019 0928   LDLCALC 54 04/23/2019 0741   LDLDIRECT 66.0 08/15/2017 0743      Wt Readings from Last 3 Encounters:  09/03/19 170 lb 2 oz (77.2 kg)  06/27/19 169 lb (76.7 kg)  05/31/19 173 lb 9 oz (78.7 kg)      Other studies Reviewed: Additional studies/ records that were reviewed today include: CXR, ECG labs and records ER visit 12/01/16.    ASSESSMENT AND PLAN:  1.  Chest Pain:  Normal ETT 12/14/16 Calcium Score 172 86 th percentile 12/16/16 Calcium was noted In proximal RCA and LAD  2. Chol:  Continue zetia  F/u lipid clinic to discuss nexlitol or PSK 9 as statin alternative  3. HTN:  Well controlled.  Continue current medications and low sodium Dash type diet.   4. DM:  Discussed low carb diet.  Target hemoglobin A1c is 6.5 or less.  Continue current medications. 5. Depression on celexa f/u Dr Damita Dunnings  6. Stroke: lacunar on ASA carotids with no high grade lesion f/u neuro    Current medicines are reviewed at length with the patient today.  The patient does not have concerns regarding medicines.  The following changes have been made:  None   Labs/ tests ordered today include:  None   No orders of the defined types were placed in this encounter.    Disposition:   FU with me in a year   Refer to lipid clinic     Signed, Jenkins Rouge, MD  12/09/2019 12:15 PM    [address], [place], [place]  [address] Phone: [phone]; Fax: [phone]

## 2019-12-12 ENCOUNTER — Ambulatory Visit: Payer: Medicare Other | Admitting: Cardiovascular Disease

## 2019-12-12 NOTE — Progress Notes (Unsigned)
Patient ID: Allison Yoder                 DOB: Jun 29, 1951                    MRN: [id number]     HPI: Allison Yoder is a 69 y.o. female patient referred to lipid clinic by her Dr. Johnsie Cancel. PMH is significant for hx of CVA (2019), chest pain, HLD, HTN, DM, and OSA. CT cardiac scoring on 12/14/16 showed a coronary calcium score of 172 (86th percentile). Patient has a history of statin intolerance to multiple statin medications.  Patient presents today for initial appt with lipid clinic.  NEED INSURANCE CARD NEED UPDATED LIPID PANEL  Current Medications: ezetimibe 10 mg daily Intolerances: atorvastatin 10 mg daily (myalgias), ezetimibe-simvastatin 10-20 mg daily, rosuvastatin 5 mg daily (myalgias), simvastatin 40 mg daily (myalgias), pravastatin (myalgias), niacine (flushing) Risk Factors: ASCVD (chest pain, (hx of CVA (2019)), calcium score 172 (86th percentile), HLD, HTN, DM, strong familial history LDL goal: < 70 mg/dL  Diet:   Exercise:   Family History: The patient's family history includes Alcohol abuse in her father; Arthritis in her maternal grandmother, mother, and sister; Breast cancer in her cousin; Dementia in her paternal grandmother; Depression in her mother; Diabetes in her maternal grandmother, mother, paternal uncle, and sister; Heart disease in her mother, paternal aunt, paternal aunt, paternal uncle, and paternal uncle; Hyperlipidemia in her mother; Hypertension in her mother; Lung cancer in her father; Stroke in her maternal grandmother, mother, and paternal aunt.   Social History: The patient  reports that she quit smoking about 33 years ago. Her smoking use included cigarettes. She has a 10.00 pack-year smoking history. She has never used smokeless tobacco. She reports current alcohol use of about 3.0 standard drinks of alcohol per week. She reports that she does not use drugs.  Labs: 05/31/19 TC 118 TG 153 HDL 35.4 LDL 52; simvastatin 40 mg daily switched to rosuvastatin  5 mg daily on 02/15/2019, eztimibe 10 mg daily 04/23/19 TC 110 TG 121 HDL 32 LDL 54; simvastatin 40 mg daily, ezetimibe 10 mg daily 11/23/18 TC 246 TG 144 HDL 35 LDL 182; eztimibe-simvastatin 10-40 mg daily  Past Medical History:  Diagnosis Date  . Allergy   . Anemia    "all the time as a child"  . Anxiety   . Arthritis    "all my joints; worse in my hands" (10/27/2015)  . Chronic lower back pain   . Depression   . Diverticulosis   . Fatty liver   . GERD (gastroesophageal reflux disease)   . Heart murmur    "dx'd years ago; very mild; never treated" (10/27/2015)  . Hyperlipidemia   . Hypertension   . Hypothyroidism 2002-~ 2010  . Iritis    per [facility]  . Migraines    "stopped when I went thru menopause"  . OSA on CPAP   . Pneumonia ~ 2013; 10/27/2015  . Recurrent urinary tract infection   . Routine general medical examination at a health care facility 02/26/2014  . Snores   . Stroke ([place])   . Type II diabetes mellitus ([place])    "lost weight; started exercising again; don't have it anymore" (10/27/2015)  . Urinary incontinence   . Wears glasses     Current Outpatient Medications on File Prior to Visit  Medication Sig Dispense Refill  . albuterol (VENTOLIN HFA) 108 (90 Base) MCG/ACT inhaler Inhale 1-2 puffs into the  lungs every 6 (six) hours as needed for wheezing or shortness of breath. 18 g 2  . amoxicillin-clavulanate (AUGMENTIN) 875-125 MG tablet Take 1 tablet by mouth 2 (two) times daily. 20 tablet 0  . Blood Glucose Monitoring Suppl (ONE TOUCH ULTRA 2) w/Device KIT Check blood sugar once daily and as instructed. Dx E11.9 1 each 0  . citalopram (CELEXA) 20 MG tablet TAKE 1 TABLET BY MOUTH  DAILY 90 tablet 3  . clonazePAM (KLONOPIN) 0.5 MG tablet Take 1 tablet (0.5 mg total) by mouth daily as needed for anxiety. 30 tablet 1  . cyanocobalamin 1000 MCG tablet Take 1,000 mcg by mouth daily.    . cyclobenzaprine (FLEXERIL) 10 MG tablet Take 1 tablet (10 mg total) by  mouth daily as needed for muscle spasms. 90 tablet 3  . ezetimibe (ZETIA) 10 MG tablet TAKE 1 TABLET BY MOUTH ONCE DAILY 90 tablet 3  . fluticasone (FLONASE) 50 MCG/ACT nasal spray Place into both nostrils daily as needed for allergies or rhinitis.    Marland Kitchen glucose blood (ONE TOUCH ULTRA TEST) test strip Check blood sugar once daily and as instructed. Dx E11.9 100 each 1  . levocetirizine (XYZAL) 5 MG tablet Take 5 mg by mouth every evening.    Marland Kitchen lisinopril (ZESTRIL) 10 MG tablet TAKE 1 TABLET BY MOUTH AT  BEDTIME 90 tablet 0  . metFORMIN (GLUCOPHAGE) 500 MG tablet TAKE 1 TABLET BY MOUTH  TWICE DAILY WITH A MEAL 180 tablet 1  . metoprolol succinate (TOPROL-XL) 50 MG 24 hr tablet Take 1 tablet (50 mg total) by mouth daily. Take with or immediately following a meal. 90 tablet 3  . Multiple Vitamin (MULTIVITAMIN) tablet Take 1 tablet by mouth daily.    Marland Kitchen NITROFURANTOIN PO Take 100 mg by mouth daily as needed.     [name] DELICA LANCETS 58I MISC Check blood sugar once daily and as instructed. Dx E11.9 100 each 1  . Vitamin D, Cholecalciferol, 1000 units CAPS Take 3,000 Units by mouth daily.     No current facility-administered medications on file prior to visit.    Allergies  Allergen Reactions  . Cephalexin Itching    Does tolerate augmentin  . Codeine Itching  . Inapsine [Droperidol] Shortness Of Breath, Anxiety and Hypertension    Elevated HR and BP, panic attack  . Crestor [Rosuvastatin]     Aches  . Lipitor [Atorvastatin Calcium]     Myalgias   . Niacin And Related     Flushing  . Pravastatin     Myalgias  . Adhesive [Tape] Rash    Blisters with tape    Assessment/Plan:  1. Hyperlipidemia - LDL goal < 70 mg/dL; therefore patient does not appear to be at goal.    Thank you for involving pharmacy to assist in providing this patient's care.   Drexel Iha, PharmD PGY2 Ambulatory Care Pharmacy Resident

## 2019-12-17 ENCOUNTER — Ambulatory Visit: Payer: Medicare Other

## 2019-12-24 NOTE — Progress Notes (Signed)
Cardiology Office Note   Date:  12/25/2019   ID:  Allison Yoder, DOB [birth year], MRN [id number]  PCP:  Tonia Ghent, MD  Cardiologist:   Jenkins Rouge, MD   No chief complaint on file.     History of Present Illness:  69 y.o. chest pain 12/01/16.  Normal ETT 12/14/16 Calcium score elevated 172 on statin Also history of HTN and DM  Echo from 01/26/16 normal EF 65-70% done for TIA Carotid 5/17  Plaque RICA no stenosis   CVA 01/2016 lacunar with small area of acute infarct in left amygdala and tail of hippocampus   She has a grand-baby in [place] named Jaxson She has partial custody of him as her son and his wife Are having issues with drug addiction   LDL is elevated  She is intolerant to crestor, lipitor, pravastatin and niacin  She has been on zetia and was to see lipid clinic but has not shown Her LDL has been as high as 182 ? Off Rx and was 52 on 05/31/19 ? On brand name vytorin that she cannot get now   Working again at [employer] as she has spent a lot of time and money trying to get custody of her grandson    Past Medical History:  Diagnosis Date  . Allergy   . Anemia    "all the time as a child"  . Anxiety   . Arthritis    "all my joints; worse in my hands" (10/27/2015)  . Chronic lower back pain   . Depression   . Diverticulosis   . Fatty liver   . GERD (gastroesophageal reflux disease)   . Heart murmur    "dx'd years ago; very mild; never treated" (10/27/2015)  . Hyperlipidemia   . Hypertension   . Hypothyroidism 2002-~ 2010  . Iritis    per  [facility]  . Migraines    "stopped when I went thru menopause"  . OSA on CPAP   . Pneumonia ~ 2013; 10/27/2015  . Recurrent urinary tract infection   . Routine general medical examination at a health care facility 02/26/2014  . Snores   . Stroke ([place])   . Type II diabetes mellitus ([place])    "lost weight; started exercising again; don't have it anymore" (10/27/2015)  . Urinary incontinence   . Wears  glasses     Past Surgical History:  Procedure Laterality Date  . BLEPHAROPLASTY Bilateral 2013; 2016  . BREAST LUMPECTOMY Right 1964   benign tumor,  . HEMORRHOID BANDING  X 2  . KNEE ARTHROSCOPY Left 2016   "meniscus repair"  . PELVIC FLOOR REPAIR  2001   "lift"  . POLYPECTOMY  X 3   "bladder"  . SHOULDER ARTHROSCOPY W/ ROTATOR CUFF REPAIR Left 2013  . SHOULDER ARTHROSCOPY W/ ROTATOR CUFF REPAIR Right 2015  . TUBAL LIGATION  ~ 1986  . VAGINAL HYSTERECTOMY  1992   Partial     Current Outpatient Medications  Medication Sig Dispense Refill  . albuterol (VENTOLIN HFA) 108 (90 Base) MCG/ACT inhaler Inhale 1-2 puffs into the lungs every 6 (six) hours as needed for wheezing or shortness of breath. 18 g 2  . amoxicillin-clavulanate (AUGMENTIN) 875-125 MG tablet Take 1 tablet by mouth 2 (two) times daily. 20 tablet 0  . Blood Glucose Monitoring Suppl (ONE TOUCH ULTRA 2) w/Device KIT Check blood sugar once daily and as instructed. Dx E11.9 1 each 0  . citalopram (CELEXA) 20 MG tablet TAKE  1 TABLET BY MOUTH  DAILY 90 tablet 3  . clonazePAM (KLONOPIN) 0.5 MG tablet Take 1 tablet (0.5 mg total) by mouth daily as needed for anxiety. 30 tablet 1  . cyanocobalamin 1000 MCG tablet Take 1,000 mcg by mouth daily.    . cyclobenzaprine (FLEXERIL) 10 MG tablet Take 1 tablet (10 mg total) by mouth daily as needed for muscle spasms. 90 tablet 3  . ezetimibe (ZETIA) 10 MG tablet TAKE 1 TABLET BY MOUTH ONCE DAILY 90 tablet 3  . fluticasone (FLONASE) 50 MCG/ACT nasal spray Place into both nostrils daily as needed for allergies or rhinitis.    Marland Kitchen glucose blood (ONE TOUCH ULTRA TEST) test strip Check blood sugar once daily and as instructed. Dx E11.9 100 each 1  . levocetirizine (XYZAL) 5 MG tablet Take 5 mg by mouth every evening.    Marland Kitchen lisinopril (ZESTRIL) 10 MG tablet TAKE 1 TABLET BY MOUTH AT  BEDTIME 90 tablet 0  . metFORMIN (GLUCOPHAGE) 500 MG tablet TAKE 1 TABLET BY MOUTH  TWICE DAILY WITH A MEAL 180  tablet 1  . metoprolol succinate (TOPROL-XL) 50 MG 24 hr tablet Take 1 tablet (50 mg total) by mouth daily. Take with or immediately following a meal. 90 tablet 3  . Multiple Vitamin (MULTIVITAMIN) tablet Take 1 tablet by mouth daily.    Marland Kitchen NITROFURANTOIN PO Take 100 mg by mouth daily as needed.     [name] DELICA LANCETS 76P MISC Check blood sugar once daily and as instructed. Dx E11.9 100 each 1  . Vitamin D, Cholecalciferol, 1000 units CAPS Take 3,000 Units by mouth daily.     No current facility-administered medications for this visit.    Allergies:   Cephalexin, Codeine, Inapsine [droperidol], Crestor [rosuvastatin], Lipitor [atorvastatin calcium], Niacin and related, Pravastatin, and Adhesive [tape]    Social History:  The patient  reports that she quit smoking about 34 years ago. Her smoking use included cigarettes. She has a 10.00 pack-year smoking history. She has never used smokeless tobacco. She reports current alcohol use of about 3.0 standard drinks of alcohol per week. She reports that she does not use drugs.   Family History:  The patient's family history includes Alcohol abuse in her father; Arthritis in her maternal grandmother, mother, and sister; Breast cancer in her cousin; Dementia in her paternal grandmother; Depression in her mother; Diabetes in her maternal grandmother, mother, paternal uncle, and sister; Heart disease in her mother, paternal aunt, paternal aunt, paternal uncle, and paternal uncle; Hyperlipidemia in her mother; Hypertension in her mother; Lung cancer in her father; Stroke in her maternal grandmother, mother, and paternal aunt.    ROS:  Please see the history of present illness.   Otherwise, review of systems are positive for none.   All other systems are reviewed and negative.    PHYSICAL EXAM: VS:  BP (!) 146/78   Pulse 92   Ht 5' 6.5" (1.689 m)   Wt 178 lb (80.7 kg)   SpO2 96%   BMI 28.30 kg/m  , BMI Body mass index is 28.3 kg/m. Affect  appropriate Healthy:  appears stated age 29: normal Neck supple with no adenopathy JVP normal no bruits no thyromegaly Lungs clear with no wheezing and good diaphragmatic motion Heart:  S1/S2 no murmur, no rub, gallop or click PMI normal Abdomen: benighn, BS positve, no tenderness, no AAA no bruit.  No HSM or HJR Distal pulses intact with no bruits No edema Neuro non-focal Skin warm and  dry No muscular weakness   EKG:  NSR normal ECG  11/20/17 4/14//21  SR rate 92 normal    Recent Labs: 05/31/2019: ALT 34; BUN 16; Creatinine, Ser 0.75; Hemoglobin 13.9; Platelets 212.0; Potassium 4.8; Sodium 139; TSH 2.95    Lipid Panel    Component Value Date/Time   CHOL 118 05/31/2019 0928   CHOL 110 04/23/2019 0741   TRIG 153.0 (H) 05/31/2019 0928   HDL 35.40 (L) 05/31/2019 0928   HDL 32 (L) 04/23/2019 0741   CHOLHDL 3 05/31/2019 0928   VLDL 30.6 05/31/2019 0928   LDLCALC 52 05/31/2019 0928   LDLCALC 54 04/23/2019 0741   LDLDIRECT 66.0 08/15/2017 0743      Wt Readings from Last 3 Encounters:  12/25/19 178 lb (80.7 kg)  09/03/19 170 lb 2 oz (77.2 kg)  06/27/19 169 lb (76.7 kg)      Other studies Reviewed: Additional studies/ records that were reviewed today include: CXR, ECG labs and records ER visit 12/01/16.    ASSESSMENT AND PLAN:  1.  Chest Pain:  Normal ETT 12/14/16 Calcium Score 172 86 th percentile 12/16/16 Calcium was noted In proximal RCA and LAD  2. Chol:  Continue zetia  F/u lipid clinic to discuss nexlitol or PSK 9 as statin alternative intolerant to pravastatin, crestor, lipitor and niacin  3. HTN:  Well controlled.  Continue current medications and low sodium Dash type diet.   4. DM:  Discussed low carb diet.  Target hemoglobin A1c is 6.5 or less.  Continue current medications. 5. Depression on celexa f/u Dr Damita Dunnings  6. Stroke: lacunar on ASA carotids with no high grade lesion f/u neuro    Current medicines are reviewed at length with the patient today.  The  patient does not have concerns regarding medicines.  The following changes have been made:  None   Labs/ tests ordered today include:  None   Orders Placed This Encounter  Procedures  . Hepatic function panel  . Lipid panel  . EKG 12-Lead     Disposition:   FU with me in a year   Refer to lipid clinic     Signed, Jenkins Rouge, MD  12/25/2019 8:48 AM    [address], [place], [place]  [address] Phone: [phone]; Fax: [phone]

## 2019-12-25 ENCOUNTER — Ambulatory Visit (INDEPENDENT_AMBULATORY_CARE_PROVIDER_SITE_OTHER): Payer: Medicare Other | Admitting: Cardiovascular Disease

## 2019-12-25 ENCOUNTER — Encounter: Payer: Self-pay | Admitting: Cardiovascular Disease

## 2019-12-25 ENCOUNTER — Other Ambulatory Visit: Payer: Self-pay

## 2019-12-25 VITALS — BP 146/78 | HR 92 | Ht 66.5 in | Wt 178.0 lb

## 2019-12-25 DIAGNOSIS — I10 Essential (primary) hypertension: Secondary | ICD-10-CM

## 2019-12-25 DIAGNOSIS — E785 Hyperlipidemia, unspecified: Secondary | ICD-10-CM

## 2019-12-25 NOTE — Patient Instructions (Addendum)
Medication Instructions:  *If you need a refill on your cardiac medications before your next appointment, please call your pharmacy*  Lab Work: Your physician recommends that you return for lab work on Monday for fasting lipid and liver.   If you have labs (blood work) drawn today and your tests are completely normal, you will receive your results only by: Marland Kitchen MyChart Message (if you have MyChart) OR . A paper copy in the mail If you have any lab test that is abnormal or we need to change your treatment, we will call you to review the results.  Testing/Procedures: None ordered today.  Follow-Up: At [facility], you and your health needs are our priority.  As part of our continuing mission to provide you with exceptional heart care, we have created designated Provider Care Teams.  These Care Teams include your primary Cardiologist (physician) and Advanced Practice Providers (APPs -  Physician Assistants and Nurse Practitioners) who all work together to provide you with the care you need, when you need it.  We recommend signing up for the patient portal called "MyChart".  Sign up information is provided on this After Visit Summary.  MyChart is used to connect with patients for Virtual Visits (Telemedicine).  Patients are able to view lab/test results, encounter notes, upcoming appointments, etc.  Non-urgent messages can be sent to your provider as well.   To learn more about what you can do with MyChart, go to [email or web address].    Your next appointment:   12 month(s)  The format for your next appointment:   In Person  Provider:   You may see Jenkins Rouge, MD or one of the following Advanced Practice Providers on your designated Care Team:    Truitt Merle, NP  Cecilie Kicks, NP  Kathyrn Drown, NP

## 2019-12-30 ENCOUNTER — Ambulatory Visit (INDEPENDENT_AMBULATORY_CARE_PROVIDER_SITE_OTHER): Payer: Medicare Other | Admitting: Pharmacist

## 2019-12-30 ENCOUNTER — Other Ambulatory Visit: Payer: Self-pay

## 2019-12-30 ENCOUNTER — Other Ambulatory Visit: Payer: Medicare Other | Admitting: *Deleted

## 2019-12-30 DIAGNOSIS — E785 Hyperlipidemia, unspecified: Secondary | ICD-10-CM

## 2019-12-30 DIAGNOSIS — I10 Essential (primary) hypertension: Secondary | ICD-10-CM

## 2019-12-30 DIAGNOSIS — E782 Mixed hyperlipidemia: Secondary | ICD-10-CM

## 2019-12-30 LAB — LIPID PANEL
Chol/HDL Ratio: 5.9 ratio — ABNORMAL HIGH (ref 0.0–4.4)
Cholesterol, Total: 188 mg/dL (ref 100–199)
HDL: 32 mg/dL — ABNORMAL LOW (ref >39)
LDL Chol Calc (NIH): 110 mg/dL — ABNORMAL HIGH (ref 0–99)
Triglycerides: 262 mg/dL — ABNORMAL HIGH (ref 0–149)
VLDL Cholesterol Cal: 46 mg/dL — ABNORMAL HIGH (ref 5–40)

## 2019-12-30 LAB — HEPATIC FUNCTION PANEL
ALT: 47 IU/L — ABNORMAL HIGH (ref 0–32)
AST: 37 IU/L (ref 0–40)
Albumin: 4.2 g/dL (ref 3.8–4.8)
Alkaline Phosphatase: 117 IU/L (ref 39–117)
Bilirubin Total: 0.3 mg/dL (ref 0.0–1.2)
Bilirubin, Direct: 0.1 mg/dL (ref 0.00–0.40)
Total Protein: 6.5 g/dL (ref 6.0–8.5)

## 2019-12-30 NOTE — Patient Instructions (Addendum)
It was a pleasure to meet you  I will submit a prior authorization for Praluent and apply for the [employer].  I will call you once everything is approved.  Please feel free to call us at [phone] with any questions or concerns.

## 2019-12-30 NOTE — Progress Notes (Signed)
Patient ID: Allison Yoder                 DOB: 10/23/1950                    MRN: [id number]     HPI: Allison Yoder is a 69 y.o. female patient referred to lipid clinic by Dr. Johnsie Cancel. PMH is significant for HTN, DM, HLD, hypothyroidism, h/o TIA Calcium score was elevated on 12/16/2016 at 172 (86th percentile for age and sex). Patient was previously seen in lipid clinic. Her atorvastatin was increased to 65m daily, however patient could not tolerate increased dose. She was then switched to rosuvastatin 533mdaily, but reports being unable to tolerate as well. She saw Dr. NiJohnsie Cancelecently and was referred back to lipid clinic.   Patient presents today with her significant other. She states she had a horrible reaction to rosuvastatin. She was tired, muscles aches. She is not interested in trying another statin. She has been on 3. She had labs drawn this AM. Still pending.   [facility] uses [facility] [address] or [facility] [id number] Current Medications: Zetia 10 mg daily Intolerances: Simvastatin (myalgias), atorvastatin 2033mmyalgias) rosuvastatin 5mg74mily (myalgias, fatigue) Risk Factors: Stroke, DM, elevated calcium score of 172 on statin therapy  LDL goal: <55  Diet: breakfast: granolla bar or belveeta bar, coffee or tea or eggs whites with spinach/veggies Lunch: apple pecans, cheese, ham sandwich whole wheat bread, salads Dinner: romaine lettuce w/ mandarin oranges, strawberries, pecans, chicken feta (small amount) poppy seed dressing, steak twice a month, pork chops, scrambled eggs, yogurt, mexiPolandd twice a month Snacks: potato chips Drinks: lactacid non fat milk, water, un sweet tea  Exercise: walks about a mile with grandchild  Family History:  alcohol abuse in father, arthritis in maternal grandmother, mother, and sister; breast cancer in cousin; diabetes in maternal grandmother, mother, and sister; heart disease in mother, paternal  aunt, and paternal uncle; hyperlipidemia in mother; hypertension in mother; stroke in maternal grandmother, mother and paternal aunt.  Social History: Quit smoking about 33 years ago (10 pack-year smoking history). Reports drinking alcohol 3x per week (1 glass of wine)  Labs: 05/31/2019: TC 118, TG 153, HDL 35, LDL 52 (rosuvastatin 5mg 71mly, zetia 10mg 65my)  Past Medical History:  Diagnosis Date  . Allergy   . Anemia    "all the time as a child"  . Anxiety   . Arthritis    "all my joints; worse in my hands" (10/27/2015)  . Chronic lower back pain   . Depression   . Diverticulosis   . Fatty liver   . GERD (gastroesophageal reflux disease)   . Heart murmur    "dx'd years ago; very mild; never treated" (10/27/2015)  . Hyperlipidemia   . Hypertension   . Hypothyroidism 2002-~ 2010  . [facility]    "stopped when I went thru menopause"  . OSA on CPAP   . Pneumonia ~ 2013; 10/27/2015  . Recurrent urinary tract infection   . Routine general medical examination at a health care facility 02/26/2014  . Snores   . Stroke ([facility])  [place]; don't have it anymore" (10/27/2015)  . Urinary incontinence   . Wears glasses     Current Outpatient Medications on File Prior to Visit  Medication Sig Dispense Refill  . albuterol (VENTOLIN HFA) 108 (90 Base) MCG/ACT inhaler Inhale 1-2 puffs into the lungs every 6 (six) hours as needed for wheezing or shortness of breath. 18 g 2  . amoxicillin-clavulanate (AUGMENTIN) 875-125 MG tablet Take 1 tablet by mouth 2 (two) times daily. 20 tablet 0  . Blood Glucose Monitoring Suppl (ONE TOUCH ULTRA 2) w/Device KIT Check blood sugar once daily and as instructed. Dx E11.9 1 each 0  . citalopram (CELEXA) 20 MG tablet TAKE 1 TABLET BY MOUTH  DAILY 90 tablet 3  . clonazePAM (KLONOPIN) 0.5 MG tablet Take 1 tablet (0.5 mg total) by mouth daily as needed for  anxiety. 30 tablet 1  . cyanocobalamin 1000 MCG tablet Take 1,000 mcg by mouth daily.    . cyclobenzaprine (FLEXERIL) 10 MG tablet Take 1 tablet (10 mg total) by mouth daily as needed for muscle spasms. 90 tablet 3  . ezetimibe (ZETIA) 10 MG tablet TAKE 1 TABLET BY MOUTH ONCE DAILY 90 tablet 3  . fluticasone (FLONASE) 50 MCG/ACT nasal spray Place into both nostrils daily as needed for allergies or rhinitis.    Marland Kitchen glucose blood (ONE TOUCH ULTRA TEST) test strip Check blood sugar once daily and as instructed. Dx E11.9 100 each 1  . levocetirizine (XYZAL) 5 MG tablet Take 5 mg by mouth every evening.    Marland Kitchen lisinopril (ZESTRIL) 10 MG tablet TAKE 1 TABLET BY MOUTH AT  BEDTIME 90 tablet 0  . metFORMIN (GLUCOPHAGE) 500 MG tablet TAKE 1 TABLET BY MOUTH  TWICE DAILY WITH A MEAL 180 tablet 1  . metoprolol succinate (TOPROL-XL) 50 MG 24 hr tablet Take 1 tablet (50 mg total) by mouth daily. Take with or immediately following a meal. 90 tablet 3  . Multiple Vitamin (MULTIVITAMIN) tablet Take 1 tablet by mouth daily.    Marland Kitchen NITROFURANTOIN PO Take 100 mg by mouth daily as needed.     [name] DELICA LANCETS 19F MISC Check blood sugar once daily and as instructed. Dx E11.9 100 each 1  . Vitamin D, Cholecalciferol, 1000 units CAPS Take 3,000 Units by mouth daily.     No current facility-administered medications on file prior to visit.    Allergies  Allergen Reactions  . Cephalexin Itching    Does tolerate augmentin  . Codeine Itching and Rash  . Inapsine [Droperidol] Shortness Of Breath, Anxiety and Hypertension    Elevated HR and BP, panic attack  . Crestor [Rosuvastatin]     Aches  . Lipitor [Atorvastatin Calcium]     Myalgias   . Niacin And Related     Flushing  . Pravastatin     Myalgias  . Adhesive [Tape] Rash    Blisters with tape    Assessment/Plan:  1. Hyperlipidemia - LDL pending but suspect it will be above goal of <55. Discussed PCSK9i vs Nexlizet. Compared LDL lowering capability,  side effects, administration and potential cost. Patient has a family history of gout, therefore would like to avoid Nexlizet. PCSK9i will give better LDL reduction and fast cardiovascular risk reduction. Patient has a $435 deductible, but will apply for [employer] grant to help with this. I will submit prior auth for Praluent and then apply for [employer] grant once approved. Patient would like Rx sent to [facility] on [address]. I will call patient once everything is approved. Will continue zetia 68m daily until repeat labs are done on PCSK9i.  Diet and exercise were also discussed and small suggestions for diet modifications  made.   Ramond Dial, Pharm.D, BCPS, CPP [place]  [address] [place], [place] [address]  Phone: [phone]; Fax: [phone]  12/30/2019   7:35 AM

## 2019-12-31 ENCOUNTER — Telehealth: Payer: Self-pay | Admitting: Pharmacist

## 2019-12-31 MED ORDER — PRALUENT 75 MG/ML ~~LOC~~ SOAJ: 1.0000 "pen " | SUBCUTANEOUS | 11 refills | Status: DC

## 2019-12-31 NOTE — Telephone Encounter (Signed)
Praluent PA approved through 07/01/2020 [facility] grant approved Pharmacy Card Id [id number] Group [id number] PCN [id number] BIN [id number]  [facility] [address]  Called patient and LVM for her to call back. Review labs and approval TG are high. Patient needs to try to limit carbs. She eats a lot of fruit and needs to try to cut back some. Recheck lipids is 8-12 weeks.

## 2020-01-02 NOTE — Telephone Encounter (Signed)
Spoke with patient. Reviewed lipid labs with her. Advised that Praluent should be ready at no charge at [facility].  Advised that she should try to limit her carbs and sugars (try to decrease amount of fruit) because her TG were elevated. Will call patient in 2-3 months to set up repeat labs

## 2020-01-16 ENCOUNTER — Other Ambulatory Visit: Payer: Self-pay | Admitting: Family Medicine

## 2020-01-25 ENCOUNTER — Encounter: Payer: Self-pay | Admitting: Family Medicine

## 2020-01-25 ENCOUNTER — Telehealth (INDEPENDENT_AMBULATORY_CARE_PROVIDER_SITE_OTHER): Payer: Medicare Other | Admitting: Family Medicine

## 2020-01-25 ENCOUNTER — Other Ambulatory Visit: Payer: Self-pay

## 2020-01-25 VITALS — Ht 66.5 in | Wt 174.0 lb

## 2020-01-25 DIAGNOSIS — J209 Acute bronchitis, unspecified: Secondary | ICD-10-CM | POA: Diagnosis not present

## 2020-01-25 MED ORDER — PREDNISONE 10 MG PO TABS: ORAL_TABLET | ORAL | 0 refills | Status: DC

## 2020-01-25 MED ORDER — ALBUTEROL SULFATE HFA 108 (90 BASE) MCG/ACT IN AERS: 1.0000 | INHALATION_SPRAY | Freq: Four times a day (QID) | RESPIRATORY_TRACT | 1 refills | Status: DC | PRN

## 2020-01-25 MED ORDER — BENZONATATE 200 MG PO CAPS
200.0000 mg | ORAL_CAPSULE | Freq: Three times a day (TID) | ORAL | 1 refills | Status: DC | PRN
Start: 2020-01-25 — End: 2021-01-28

## 2020-01-25 NOTE — Patient Instructions (Signed)
Go ahead and get tested for covid to be on the safe side and isolate until you get a result  I sent prednisone to take as directed  Also albuterol mdi to use as needed and tessalon for cough  Update if not starting to improve in a week or if worsening   Also if any new symptoms  Please call us with your covid result

## 2020-01-25 NOTE — Progress Notes (Signed)
Virtual Visit via Video Note  I connected with Allison Yoder on 01/25/20 at 11:00 AM EDT by a video enabled telemedicine application and verified that I am speaking with the correct person using two identifiers.  Location: Patient: home Provider: office    I discussed the limitations of evaluation and management by telemedicine and the availability of in person appointments. The patient expressed understanding and agreed to proceed.  Parties involved in encounter  Patient: Allison Yoder  Provider:  Loura Pardon MD    History of Present Illness: 69 yo pt of Dr Damita Dunnings presents with c/o uri symptoms  She is a former smoker with h/o DM and CVA   A week ago had ST Then uri symptoms-? Allergies or sinus trouble  Then achey and congested Took theraflu through Wednesday   By Thursday- junky cough/ seldom produces anything  Clear to white nasal drainage   Now chest is feeling a little tight  No wheezy  No sob  Does not have an inhaler   Low grade temp early in the week -99s  Has a mild headache for weeks from allergies  usuall allergies just cause drainage  Uses flonse    Had pneumonia in the past- none since   Has not been tested for covid  Had shots in jan/feb  No sick contacts  Wears a mask   otc flonase theraflu first-then changed to alka selzer plus   Allergic to cephalexin and codeine   Patient Active Problem List   Diagnosis Date Noted  . Healthcare maintenance 07/22/2019  . Cough 07/22/2019  . Arthralgia 07/22/2019  . Medicare annual wellness visit, initial 06/10/2018  . Other chest pain 06/10/2018  . Acute bronchitis 09/16/2017  . Osteopenia 09/10/2017  . Fatty liver 05/19/2017  . Diabetes mellitus ([place]) 05/05/2017  . Cerebrovascular accident (CVA) due to thrombosis of left posterior cerebral artery ([place]) 04/08/2016  . Hypertensive disorder 10/27/2015  . Advance care planning 04/29/2015  . Obstructive sleep apnea of adult 07/25/2014  . Palpitations  01/06/2013  . Anxiety 09/01/2012  . MDD (major depressive disorder) 12/19/2011  . OA (osteoarthritis) 12/19/2011  . Hyperlipidemia 12/19/2011  . Diverticulitis 12/19/2011  . Migraines 12/19/2011  . Hypothyroid 12/19/2011  . Iritis 12/19/2011   Past Medical History:  Diagnosis Date  . Allergy   . Anemia    "all the time as a child"  . Anxiety   . Arthritis    "all my joints; worse in my hands" (10/27/2015)  . Chronic lower back pain   . Depression   . Diverticulosis   . Fatty liver   . GERD (gastroesophageal reflux disease)   . Heart murmur    "dx'd years ago; very mild; never treated" (10/27/2015)  . Hyperlipidemia   . Hypertension   . Hypothyroidism 2002-~ 2010  . Iritis    per [facility]  . Migraines    "stopped when I went thru menopause"  . OSA on CPAP   . Pneumonia ~ 2013; 10/27/2015  . Recurrent urinary tract infection   . Routine general medical examination at a health care facility 02/26/2014  . Snores   . Stroke ([place])   . Type II diabetes mellitus ([place])    "lost weight; started exercising again; don't have it anymore" (10/27/2015)  . Urinary incontinence   . Wears glasses    Past Surgical History:  Procedure Laterality Date  . BLEPHAROPLASTY Bilateral 2013; 2016  . BREAST LUMPECTOMY Right 1964   benign tumor,  .  HEMORRHOID BANDING  X 2  . KNEE ARTHROSCOPY Left 2016   "meniscus repair"  . PELVIC FLOOR REPAIR  2001   "lift"  . POLYPECTOMY  X 3   "bladder"  . SHOULDER ARTHROSCOPY W/ ROTATOR CUFF REPAIR Left 2013  . SHOULDER ARTHROSCOPY W/ ROTATOR CUFF REPAIR Right 2015  . TUBAL LIGATION  ~ 1986  . VAGINAL HYSTERECTOMY  1992   Partial   Social History   Tobacco Use  . Smoking status: Former Smoker    Packs/day: 2.00    Years: 5.00    Pack years: 10.00    Types: Cigarettes    Quit date: 09/12/1985    Years since quitting: 34.3  . Smokeless tobacco: Never Used  Substance Use Topics  . Alcohol use: Yes    Alcohol/week: 3.0 standard drinks     Types: 1 Glasses of wine, 2 Shots of liquor per week    Comment: weekends  . Drug use: No   Family History  Problem Relation Age of Onset  . Arthritis Mother   . Heart disease Mother   . Hyperlipidemia Mother   . Hypertension Mother   . Diabetes Mother   . Depression Mother   . Stroke Mother   . Alcohol abuse Father   . Lung cancer Father   . Arthritis Maternal Grandmother   . Stroke Maternal Grandmother   . Diabetes Maternal Grandmother   . Heart disease Paternal Uncle        x 2  . Arthritis Sister   . Breast cancer Cousin   . Heart disease Paternal Aunt   . Diabetes Paternal Uncle   . Diabetes Sister   . Heart disease Paternal Uncle        x 5  . Heart disease Paternal Aunt        x 3  . Stroke Paternal Aunt   . Dementia Paternal Grandmother   . Colon cancer Neg Hx    Allergies  Allergen Reactions  . Cephalexin Itching    Does tolerate augmentin  . Codeine Itching and Rash  . Inapsine [Droperidol] Shortness Of Breath, Anxiety and Hypertension    Elevated HR and BP, panic attack  . Crestor [Rosuvastatin]     Aches  . Lipitor [Atorvastatin Calcium]     Myalgias   . Niacin And Related     Flushing  . Pravastatin     Myalgias  . Adhesive [Tape] Rash    Blisters with tape   Current Outpatient Medications on File Prior to Visit  Medication Sig Dispense Refill  . Alirocumab (PRALUENT) 75 MG/ML SOAJ Inject 1 pen into the skin every 14 (fourteen) days. 2 pen 11  . Blood Glucose Monitoring Suppl (ONE TOUCH ULTRA 2) w/Device KIT Check blood sugar once daily and as instructed. Dx E11.9 1 each 0  . citalopram (CELEXA) 20 MG tablet TAKE 1 TABLET BY MOUTH  DAILY 90 tablet 3  . clonazePAM (KLONOPIN) 0.5 MG tablet Take 1 tablet (0.5 mg total) by mouth daily as needed for anxiety. 30 tablet 1  . cyanocobalamin 1000 MCG tablet Take 1,000 mcg by mouth daily.    . cyclobenzaprine (FLEXERIL) 10 MG tablet Take 1 tablet (10 mg total) by mouth daily as needed for muscle  spasms. 90 tablet 3  . ezetimibe (ZETIA) 10 MG tablet TAKE 1 TABLET BY MOUTH ONCE DAILY 90 tablet 3  . fluticasone (FLONASE) 50 MCG/ACT nasal spray Place into both nostrils daily as needed for allergies or rhinitis.    Marland Kitchen  glucose blood (ONE TOUCH ULTRA TEST) test strip Check blood sugar once daily and as instructed. Dx E11.9 100 each 1  . levocetirizine (XYZAL) 5 MG tablet Take 5 mg by mouth every evening.    Marland Kitchen lisinopril (ZESTRIL) 10 MG tablet TAKE 1 TABLET BY MOUTH AT  BEDTIME 90 tablet 3  . metFORMIN (GLUCOPHAGE) 500 MG tablet TAKE 1 TABLET BY MOUTH  TWICE DAILY WITH A MEAL 180 tablet 1  . metoprolol succinate (TOPROL-XL) 50 MG 24 hr tablet Take 1 tablet (50 mg total) by mouth daily. Take with or immediately following a meal. 90 tablet 3  . Multiple Vitamin (MULTIVITAMIN) tablet Take 1 tablet by mouth daily.    Marland Kitchen NITROFURANTOIN PO Take 100 mg by mouth daily as needed.     [name] DELICA LANCETS 10C MISC Check blood sugar once daily and as instructed. Dx E11.9 100 each 1  . Vitamin D, Cholecalciferol, 1000 units CAPS Take 3,000 Units by mouth daily.     No current facility-administered medications on file prior to visit.   Review of Systems  Constitutional: Positive for malaise/fatigue. Negative for chills, diaphoresis and fever.  HENT: Positive for congestion. Negative for ear pain, sinus pain and sore throat.   Eyes: Negative for blurred vision, discharge and redness.  Respiratory: Positive for cough and sputum production. Negative for shortness of breath, wheezing and stridor.        Tight chest but not sob or wheezing   Cardiovascular: Negative for chest pain, palpitations and leg swelling.  Gastrointestinal: Negative for abdominal pain, diarrhea, nausea and vomiting.  Musculoskeletal: Negative for myalgias.  Skin: Negative for rash.  Neurological: Positive for headaches. Negative for dizziness.    Observations/Objective: Patient appears well, in no distress Weight is baseline   No facial swelling or asymmetry Normal voice-not hoarse and no slurred speech No obvious tremor or mobility impairment Moving neck and UEs normally Able to hear the call well  occ junky sounding cough but no audible wheeze during interview  Talkative and mentally sharp with no cognitive changes No skin changes on face or neck , no rash or pallor Affect is normal    Assessment and Plan: Problem List Items Addressed This Visit      Respiratory   Acute bronchitis - Primary    With tight chest/cough/clear sputum  Pt is immunized for covid but will go ahead and get tested to be safe  Home symptom control and isolation discussed Prednisone 30 mg taper and albuterol mdi and tessalon sent to pharmacy  Continue guaifenesin  Update if not starting to improve in a week or if worsening  inst to call us with result of covid test          Follow Up Instructions: Go ahead and get tested for covid to be on the safe side and isolate until you get a result  I sent prednisone to take as directed  Also albuterol mdi to use as needed and tessalon for cough  Update if not starting to improve in a week or if worsening   Also if any new symptoms  Please call us with your covid result    I discussed the assessment and treatment plan with the patient. The patient was provided an opportunity to ask questions and all were answered. The patient agreed with the plan and demonstrated an understanding of the instructions.   The patient was advised to call back or seek an in-person evaluation if the symptoms worsen or if  the condition fails to improve as anticipated.     Loura Pardon, MD

## 2020-01-25 NOTE — Assessment & Plan Note (Signed)
With tight chest/cough/clear sputum  Pt is immunized for covid but will go ahead and get tested to be safe  Home symptom control and isolation discussed Prednisone 30 mg taper and albuterol mdi and tessalon sent to pharmacy  Continue guaifenesin  Update if not starting to improve in a week or if worsening  inst to call us with result of covid test

## 2020-01-31 ENCOUNTER — Encounter: Payer: Self-pay | Admitting: Family Medicine

## 2020-02-02 MED ORDER — PREDNISONE 10 MG PO TABS: ORAL_TABLET | ORAL | 0 refills | Status: DC

## 2020-02-17 ENCOUNTER — Ambulatory Visit: Payer: Medicare Other | Admitting: Cardiovascular Disease

## 2020-03-12 ENCOUNTER — Telehealth: Payer: Self-pay

## 2020-03-12 ENCOUNTER — Other Ambulatory Visit: Payer: Medicare Other | Admitting: *Deleted

## 2020-03-12 ENCOUNTER — Other Ambulatory Visit: Payer: Self-pay

## 2020-03-12 DIAGNOSIS — E782 Mixed hyperlipidemia: Secondary | ICD-10-CM

## 2020-03-12 LAB — LIPID PANEL
Chol/HDL Ratio: 3.6 ratio (ref 0.0–4.4)
Cholesterol, Total: 124 mg/dL (ref 100–199)
HDL: 34 mg/dL — ABNORMAL LOW (ref >39)
LDL Chol Calc (NIH): 63 mg/dL (ref 0–99)
Triglycerides: 159 mg/dL — ABNORMAL HIGH (ref 0–149)
VLDL Cholesterol Cal: 27 mg/dL (ref 5–40)

## 2020-03-12 NOTE — Telephone Encounter (Signed)
-----   Message from Ramond Dial, [place] sent at 03/12/2020  7:16 AM EDT -----  ----- Message ----- From: Marcelle Overlie D, RPH-CPP Sent: 03/12/2020 To: Ramond Dial, RPH-CPP  Set up lipid labs

## 2020-03-12 NOTE — Telephone Encounter (Signed)
Called and spoke w/pt regarding needing lipid panel. Schedule pt for today at 9am

## 2020-03-17 MED ORDER — PRALUENT 150 MG/ML ~~LOC~~ SOAJ: 1.0000 "pen " | SUBCUTANEOUS | 11 refills | Status: DC

## 2020-03-31 DIAGNOSIS — Z1272 Encounter for screening for malignant neoplasm of vagina: Secondary | ICD-10-CM | POA: Diagnosis not present

## 2020-03-31 DIAGNOSIS — Z6828 Body mass index (BMI) 28.0-28.9, adult: Secondary | ICD-10-CM | POA: Diagnosis not present

## 2020-03-31 DIAGNOSIS — N952 Postmenopausal atrophic vaginitis: Secondary | ICD-10-CM | POA: Diagnosis not present

## 2020-03-31 DIAGNOSIS — Z1231 Encounter for screening mammogram for malignant neoplasm of breast: Secondary | ICD-10-CM | POA: Diagnosis not present

## 2020-03-31 DIAGNOSIS — Z01419 Encounter for gynecological examination (general) (routine) without abnormal findings: Secondary | ICD-10-CM | POA: Diagnosis not present

## 2020-04-02 ENCOUNTER — Other Ambulatory Visit: Payer: Self-pay | Admitting: Family Medicine

## 2020-04-03 NOTE — Telephone Encounter (Signed)
Please call patient and schedule 4 month follow-up visit on diabetes. Send back to CMA for refill after appointment is scheduled.

## 2020-04-06 DIAGNOSIS — H43813 Vitreous degeneration, bilateral: Secondary | ICD-10-CM | POA: Diagnosis not present

## 2020-04-06 DIAGNOSIS — E119 Type 2 diabetes mellitus without complications: Secondary | ICD-10-CM | POA: Diagnosis not present

## 2020-04-06 LAB — HM DIABETES EYE EXAM

## 2020-04-13 ENCOUNTER — Encounter: Payer: Self-pay | Admitting: Family Medicine

## 2020-04-14 DIAGNOSIS — R87619 Unspecified abnormal cytological findings in specimens from cervix uteri: Secondary | ICD-10-CM | POA: Insufficient documentation

## 2020-04-24 DIAGNOSIS — H20021 Recurrent acute iridocyclitis, right eye: Secondary | ICD-10-CM | POA: Diagnosis not present

## 2020-05-04 DIAGNOSIS — D225 Melanocytic nevi of trunk: Secondary | ICD-10-CM | POA: Diagnosis not present

## 2020-05-04 DIAGNOSIS — D485 Neoplasm of uncertain behavior of skin: Secondary | ICD-10-CM | POA: Diagnosis not present

## 2020-05-04 DIAGNOSIS — B078 Other viral warts: Secondary | ICD-10-CM | POA: Diagnosis not present

## 2020-05-04 DIAGNOSIS — Z1283 Encounter for screening for malignant neoplasm of skin: Secondary | ICD-10-CM | POA: Diagnosis not present

## 2020-05-05 DIAGNOSIS — R87619 Unspecified abnormal cytological findings in specimens from cervix uteri: Secondary | ICD-10-CM | POA: Diagnosis not present

## 2020-05-05 DIAGNOSIS — B977 Papillomavirus as the cause of diseases classified elsewhere: Secondary | ICD-10-CM | POA: Diagnosis not present

## 2020-05-05 DIAGNOSIS — R87610 Atypical squamous cells of undetermined significance on cytologic smear of cervix (ASC-US): Secondary | ICD-10-CM | POA: Diagnosis not present

## 2020-05-11 DIAGNOSIS — H20021 Recurrent acute iridocyclitis, right eye: Secondary | ICD-10-CM | POA: Diagnosis not present

## 2020-05-19 ENCOUNTER — Encounter: Payer: Self-pay | Admitting: Family Medicine

## 2020-05-20 ENCOUNTER — Other Ambulatory Visit: Payer: Self-pay | Admitting: Family Medicine

## 2020-05-20 ENCOUNTER — Telehealth: Payer: Self-pay | Admitting: Family Medicine

## 2020-05-20 MED ORDER — AMOXICILLIN-POT CLAVULANATE 875-125 MG PO TABS: 1.0000 | ORAL_TABLET | Freq: Two times a day (BID) | ORAL | 0 refills | Status: DC

## 2020-05-20 NOTE — Telephone Encounter (Signed)
Please triage patient regarding abdominal pain, possible diverticulitis.  I sent Augmentin prescription in the evening on 05/20/2020.  Thanks.

## 2020-05-21 ENCOUNTER — Telehealth: Payer: Self-pay | Admitting: *Deleted

## 2020-05-21 MED ORDER — FLUCONAZOLE 150 MG PO TABS
150.0000 mg | ORAL_TABLET | Freq: Once | ORAL | 0 refills | Status: AC
Start: 2020-05-21 — End: 2020-05-21

## 2020-05-21 NOTE — Telephone Encounter (Signed)
I spoke with pt; pt started Augmentin last night; today pt continues with liquid diet; pt said she is feeling better this morning with 2 doses of Augmentin and liquid diet; pt plans to continue the liquid diet at least one more day depending on how she feels. Pt said the soreness in lt lower quadrant of abdomen is also better this morning. Pt will cb if condition changes or worsens. UC precautions given and pt voiced understanding. See note from [place].

## 2020-05-21 NOTE — Telephone Encounter (Signed)
Sent. Thanks.   

## 2020-05-21 NOTE — Addendum Note (Signed)
Addended by: Tonia Ghent on: 05/21/2020 02:04 PM   Modules accepted: Orders

## 2020-05-21 NOTE — Telephone Encounter (Signed)
Called patient as requested concerning a flare of diverticulitis.  Patient has started the medication last evening and asks if she could get a Rx of Diflucan to take after she finishes the course of ABX?  She is taking probiotics and yogurt so she may not need it but wants it just in case.

## 2020-05-22 NOTE — Telephone Encounter (Signed)
Noted. Thanks.

## 2020-05-26 ENCOUNTER — Telehealth: Payer: Self-pay

## 2020-05-26 DIAGNOSIS — E782 Mixed hyperlipidemia: Secondary | ICD-10-CM

## 2020-05-26 NOTE — Telephone Encounter (Signed)
-----   Message from Ramond Dial, [place] sent at 05/26/2020  7:24 AM EDT -----  ----- Message ----- From: Marcelle Overlie D, RPH-CPP Sent: 05/26/2020 To: Ramond Dial, RPH-CPP  Set up lipids-praluent was increased to 150

## 2020-05-26 NOTE — Telephone Encounter (Signed)
Called and lmomed the pt regarding needing an appt for lipid and lft labs since the increase of the praluent dosage. Will await call back.

## 2020-05-27 ENCOUNTER — Other Ambulatory Visit: Payer: Medicare Other | Admitting: *Deleted

## 2020-05-27 ENCOUNTER — Other Ambulatory Visit: Payer: Self-pay

## 2020-05-27 DIAGNOSIS — E782 Mixed hyperlipidemia: Secondary | ICD-10-CM

## 2020-05-27 LAB — HEPATIC FUNCTION PANEL
ALT: 81 IU/L — ABNORMAL HIGH (ref 0–32)
AST: 59 IU/L — ABNORMAL HIGH (ref 0–40)
Albumin: 4.1 g/dL (ref 3.8–4.8)
Alkaline Phosphatase: 119 IU/L (ref 44–121)
Bilirubin Total: 0.5 mg/dL (ref 0.0–1.2)
Bilirubin, Direct: 0.17 mg/dL (ref 0.00–0.40)
Total Protein: 6.7 g/dL (ref 6.0–8.5)

## 2020-05-27 LAB — LIPID PANEL
Chol/HDL Ratio: 2.5 ratio (ref 0.0–4.4)
Cholesterol, Total: 96 mg/dL — ABNORMAL LOW (ref 100–199)
HDL: 39 mg/dL — ABNORMAL LOW (ref >39)
LDL Chol Calc (NIH): 34 mg/dL (ref 0–99)
Triglycerides: 133 mg/dL (ref 0–149)
VLDL Cholesterol Cal: 23 mg/dL (ref 5–40)

## 2020-05-28 ENCOUNTER — Telehealth: Payer: Self-pay | Admitting: Pharmacist

## 2020-05-28 DIAGNOSIS — E782 Mixed hyperlipidemia: Secondary | ICD-10-CM

## 2020-05-28 NOTE — Telephone Encounter (Signed)
LDL wonderful! Down to 34 on Praluent 150mg  q 14 days. AST and ALT mildly elevated. Pt has hx of fatty liver. Will repeat in 4 weeks to make sure stable. Patient made aware of results and all questions answered.

## 2020-06-04 DIAGNOSIS — H20021 Recurrent acute iridocyclitis, right eye: Secondary | ICD-10-CM | POA: Diagnosis not present

## 2020-06-07 ENCOUNTER — Other Ambulatory Visit: Payer: Self-pay | Admitting: Family Medicine

## 2020-06-18 ENCOUNTER — Other Ambulatory Visit: Payer: Self-pay

## 2020-06-18 ENCOUNTER — Other Ambulatory Visit: Payer: Medicare Other

## 2020-06-18 DIAGNOSIS — E782 Mixed hyperlipidemia: Secondary | ICD-10-CM

## 2020-06-19 LAB — HEPATIC FUNCTION PANEL
ALT: 80 IU/L — ABNORMAL HIGH (ref 0–32)
AST: 52 IU/L — ABNORMAL HIGH (ref 0–40)
Albumin: 4.2 g/dL (ref 3.8–4.8)
Alkaline Phosphatase: 136 IU/L — ABNORMAL HIGH (ref 44–121)
Bilirubin Total: 0.4 mg/dL (ref 0.0–1.2)
Bilirubin, Direct: 0.13 mg/dL (ref 0.00–0.40)
Total Protein: 6.5 g/dL (ref 6.0–8.5)

## 2020-06-22 DIAGNOSIS — Z23 Encounter for immunization: Secondary | ICD-10-CM | POA: Diagnosis not present

## 2020-06-25 ENCOUNTER — Other Ambulatory Visit: Payer: Medicare Other

## 2020-07-17 ENCOUNTER — Telehealth: Payer: Self-pay | Admitting: Family Medicine

## 2020-07-17 NOTE — Chronic Care Management (AMB) (Signed)
  Chronic Care Management   Note  07/17/2020 Name: Allison Yoder MRN: [id number] DOB: [birth year]  Allison Yoder is a 69 y.o. year old female who is a primary care patient of Tonia Ghent, MD. I reached out to [employer] by phone today in response to a referral sent by Ms. Lendell Caprice Pyeatt's PCP, Tonia Ghent, MD.   Allison Yoder was given information about Chronic Care Management services today including:  1. CCM service includes personalized support from designated clinical staff supervised by her physician, including individualized plan of care and coordination with other care providers 2. 24/7 contact phone numbers for assistance for urgent and routine care needs. 3. Service will only be billed when office clinical staff spend 20 minutes or more in a month to coordinate care. 4. Only one practitioner may furnish and bill the service in a calendar month. 5. The patient may stop CCM services at any time (effective at the end of the month) by phone call to the office staff.   Patient agreed to services and verbal consent obtained.   Follow up plan:   [place]

## 2020-07-17 NOTE — Progress Notes (Signed)
  Chronic Care Management   Outreach Note  07/17/2020 Name: Allison Yoder MRN: [id number] DOB: 1950/09/18  Referred by: Tonia Ghent, MD Reason for referral : Chronic Care Management   An unsuccessful telephone outreach was attempted today. The patient was referred to the pharmacist for assistance with care management and care coordination.   Follow Up Plan:   Hilario Quarry  Upstream Scheduler

## 2020-07-20 ENCOUNTER — Other Ambulatory Visit: Payer: Self-pay

## 2020-07-20 ENCOUNTER — Ambulatory Visit (INDEPENDENT_AMBULATORY_CARE_PROVIDER_SITE_OTHER): Payer: Medicare Other | Admitting: Family Medicine

## 2020-07-20 ENCOUNTER — Encounter: Payer: Self-pay | Admitting: Family Medicine

## 2020-07-20 VITALS — BP 142/80 | HR 88 | Temp 97.3°F | Wt 177.0 lb

## 2020-07-20 DIAGNOSIS — R1310 Dysphagia, unspecified: Secondary | ICD-10-CM

## 2020-07-20 DIAGNOSIS — E119 Type 2 diabetes mellitus without complications: Secondary | ICD-10-CM

## 2020-07-20 LAB — POCT GLYCOSYLATED HEMOGLOBIN (HGB A1C): Hemoglobin A1C: 8.8 % — AB (ref 4.0–5.6)

## 2020-07-20 MED ORDER — CELECOXIB 100 MG PO CAPS: 100.0000 mg | ORAL_CAPSULE | Freq: Every day | ORAL | Status: DC

## 2020-07-20 MED ORDER — METFORMIN HCL 500 MG PO TABS
ORAL_TABLET | ORAL | 3 refills | Status: DC
Start: 2020-07-20 — End: 2020-12-11

## 2020-07-20 NOTE — Assessment & Plan Note (Addendum)
Increase metformin to 2 tabs BID and recheck in about 3 months.   She will check on coverage for a dexcom meter.  I will asked staff here if they have any guidance about this. Recheck in about 3 months, A1c at the visit.

## 2020-07-20 NOTE — Progress Notes (Signed)
This visit occurred during the SARS-CoV-2 public health emergency.  Safety protocols were in place, including screening questions prior to the visit, additional usage of staff PPE, and extensive cleaning of exam room while observing appropriate contact time as indicated for disinfecting solutions.  Diabetes:  Using medications without difficulties: still on metformin BID.   Diet had been off recently, d/w pt.   Hypoglycemic episodes: no Hyperglycemic episodes:no Feet problems: no tingling  Blood Sugars averaging: not checked.   Statin intolerant due to myalgias.  A1c up, d/w pt.     She is on celebrex per outside clinic with relief of joint pain.  I'll defer.  She agrees.    She had to get a restraining order against her son.  D/w pt.  She is safe at home.  D/w pt.  She is still caring for her grandson at baseline.  Her work situation is going well.    She thought she was having episodic esophageal spasms.  She had an episode with food sticking.  She didn't choke but she couldn't swallow the bolus fully.  It finally moved distally.  This wasn't the first time.  Not daily sx.    Meds, vitals, and allergies reviewed.  ROS: Per HPI unless specifically indicated in ROS section   GEN: nad, alert and oriented HEENT: ncat NECK: supple w/o LA CV: rrr. PULM: ctab, no inc wob ABD: soft, +bs EXT: no edema SKIN: no acute rash  Diabetic foot exam: Normal inspection No skin breakdown Callus noted on medial R 5th toe.   Normal DP pulses Normal sensation to light touch and monofilament Nails normal

## 2020-07-20 NOTE — Patient Instructions (Addendum)
Increase metformin to 2 tabs twice a day and recheck in about 3 months.   Check on coverage for a dexcom meter.   Recheck in about 3 months, A1c at the visit.  Take care.  Glad to see you. We'll call about seeing GI.  Small bites in the meantime.

## 2020-07-22 DIAGNOSIS — R1310 Dysphagia, unspecified: Secondary | ICD-10-CM | POA: Insufficient documentation

## 2020-07-22 NOTE — Assessment & Plan Note (Signed)
She thought she was having episodic esophageal spasms.  Discussed.  Refer.  Routine cautions given to patient.  She agrees.

## 2020-07-28 ENCOUNTER — Other Ambulatory Visit: Payer: Self-pay | Admitting: Family Medicine

## 2020-07-28 ENCOUNTER — Other Ambulatory Visit: Payer: Self-pay

## 2020-07-28 ENCOUNTER — Telehealth (INDEPENDENT_AMBULATORY_CARE_PROVIDER_SITE_OTHER): Payer: Medicare Other | Admitting: Family Medicine

## 2020-07-28 DIAGNOSIS — R059 Cough, unspecified: Secondary | ICD-10-CM

## 2020-07-28 MED ORDER — ALBUTEROL SULFATE HFA 108 (90 BASE) MCG/ACT IN AERS
1.0000 | INHALATION_SPRAY | Freq: Four times a day (QID) | RESPIRATORY_TRACT | 1 refills | Status: DC | PRN
Start: 2020-07-28 — End: 2020-07-28

## 2020-07-28 NOTE — Telephone Encounter (Signed)
Refill sent in 07/28/20  18G/1 Note from pharmacy is that patient request a 90 day supply Last office visit today 07/28/20

## 2020-07-28 NOTE — Progress Notes (Signed)
Interactive audio and video telecommunications were attempted between this provider and patient, however failed, due to patient having technical difficulties OR patient did not have access to video capability.  We continued and completed visit with audio only.   Virtual Visit via Telephone Note  I connected with patient on 07/28/20  at 9:11 AM  by telephone and verified that I am speaking with the correct person using two identifiers.  Location of patient: home.   Location of MD: [facility] Name of referring provider (if blank then none associated): Names per persons and role in encounter:  MD: Earlyne Iba, Patient: name listed above.    I discussed the limitations, risks, security and privacy concerns of performing an evaluation and management service by telephone and the availability of in person appointments. I also discussed with the patient that there may be a patient responsible charge related to this service. The patient expressed understanding and agreed to proceed.  CC: cough.    History of Present Illness:   Recently with cough and rhinorrhea but she had sx predating that for >1 week.  She had L sided sinus pressure that improved with neti pot.  Cough most of last week.  No fevers.  No vomiting.  She had to leave work yesterday due to cough.  She had covid vaccine.  Throat irritation noted.  Chest felt tighter prev but didn't have to use SABA.  Recently with some loose stools for about 4 days, better in the meantime.  No facial pain now.  No ear pain.  She is using OTC cold medicine in the meantime.  No wheeze.  She hasn't used tessalon recently.  Her cough is clearly better today.  She had used flonase prev but hasn't used in the last few days.  She clearly feels better today.  Voice sounds back to baseline after clearing her throat.    Observations/Objective: nad Speech wnl.    Assessment and Plan: Cough, improved recently.  D/w pt about SABA prn, tessalon prn, flonase,  rest and fluids and update me as needed.  Hold on abx and prednisone given recent improvement.  She agrees with plan.  She'll update me as needed.  Would defer covid testing at this point given improvement and timeline.   Follow Up Instructions: see above.    I discussed the assessment and treatment plan with the patient. The patient was provided an opportunity to ask questions and all were answered. The patient agreed with the plan and demonstrated an understanding of the instructions.   The patient was advised to call back or seek an in-person evaluation if the symptoms worsen or if the condition fails to improve as anticipated.  I provided 13 minutes of non-face-to-face time during this encounter.  Elsie Stain, MD

## 2020-07-29 NOTE — Telephone Encounter (Signed)
Sent. Thanks.   

## 2020-07-29 NOTE — Assessment & Plan Note (Signed)
Cough, improved recently.  D/w pt about SABA prn, tessalon prn, flonase, rest and fluids and update me as needed.  Hold on abx and prednisone given recent improvement.  She agrees with plan.  She'll update me as needed.  Would defer covid testing at this point given improvement and timeline.

## 2020-07-31 DIAGNOSIS — R1311 Dysphagia, oral phase: Secondary | ICD-10-CM | POA: Diagnosis not present

## 2020-08-11 ENCOUNTER — Telehealth (INDEPENDENT_AMBULATORY_CARE_PROVIDER_SITE_OTHER): Payer: Medicare Other | Admitting: Family Medicine

## 2020-08-11 ENCOUNTER — Other Ambulatory Visit: Payer: Medicare Other

## 2020-08-11 ENCOUNTER — Other Ambulatory Visit: Payer: Self-pay | Admitting: Family Medicine

## 2020-08-11 ENCOUNTER — Encounter: Payer: Self-pay | Admitting: Family Medicine

## 2020-08-11 VITALS — BP 161/91 | HR 77 | Wt 178.0 lb

## 2020-08-11 DIAGNOSIS — Z20822 Contact with and (suspected) exposure to covid-19: Secondary | ICD-10-CM | POA: Diagnosis not present

## 2020-08-11 DIAGNOSIS — J209 Acute bronchitis, unspecified: Secondary | ICD-10-CM

## 2020-08-11 MED ORDER — PREDNISONE 10 MG PO TABS: ORAL_TABLET | ORAL | 0 refills | Status: AC

## 2020-08-11 NOTE — Progress Notes (Signed)
I connected with Allison Yoder on 08/11/20 at 12:00 PM EST by video and verified that I am speaking with the correct person using two identifiers.   I discussed the limitations, risks, security and privacy concerns of performing an evaluation and management service by video and the availability of in person appointments. I also discussed with the patient that there may be a patient responsible charge related to this service. The patient expressed understanding and agreed to proceed.  Patient location: Home Provider Location: [place] Participants: Lesleigh Noe and Brooklee V Kunzler   Subjective:     Allison Yoder is a 69 y.o. female presenting for Cough ("chest is burning") and Sinusitis     HPI  #Cough - started improving last week - felt great the week of thanksgiving - started feeling crummy on Saturday - sore throat started - sneezing on Saturday - blowing mucus from nose - this is improving - treatment: cold and flu medication - worse symptoms cough and congestion in chest - Chest feels clear but feels like congestion - no sinus pressure or sinus congestion - no loss of taste or smell  - sick contact - grandson with similar symptoms and lives with her  - no fever/vomiting, diarrhea   Using albuterol w/ improvement - but not as much as she should  No known hx of COPD  Had a hx of pneumonia 4 years ago which lead to blood infection, only using inhaler with bad colds  On day 4 of symptoms  Just got her covid booster   Review of Systems   Social History   Tobacco Use  Smoking Status Former Smoker  . Packs/day: 2.00  . Years: 5.00  . Pack years: 10.00  . Types: Cigarettes  . Quit date: 09/12/1985  . Years since quitting: 34.9  Smokeless Tobacco Never Used        Objective:   BP Readings from Last 3 Encounters:  08/11/20 (!) 161/91  07/20/20 (!) 142/80  12/25/19 (!) 146/78   Wt Readings from Last 3 Encounters:  08/11/20 178 lb  (80.7 kg)  07/28/20 175 lb (79.4 kg)  07/20/20 177 lb (80.3 kg)   BP (!) 161/91   Pulse 77   Wt 178 lb (80.7 kg)   BMI 28.30 kg/m   Physical Exam Constitutional:      Appearance: Normal appearance. She is not ill-appearing.  HENT:     Head: Normocephalic and atraumatic.     Right Ear: External ear normal.     Left Ear: External ear normal.  Eyes:     Conjunctiva/sclera: Conjunctivae normal.  Pulmonary:     Effort: Pulmonary effort is normal. No respiratory distress.  Neurological:     Mental Status: She is alert. Mental status is at baseline.  Psychiatric:        Mood and Affect: Mood normal.        Behavior: Behavior normal.        Thought Content: Thought content normal.        Judgment: Judgment normal.           Assessment & Plan:   Problem List Items Addressed This Visit      Respiratory   Acute bronchitis - Primary   Relevant Medications   predniSONE (DELTASONE) 10 MG tablet    Other Visit Diagnoses    Suspected COVID-19 virus infection         Discussed OTC treatment for viral illness Patient will  come today for drive-up testing Instructed to isolate until the results come back  Pt with hx of similar illnesses which respond to prednisone. Discussed using albuterol q4-6 hours for next 24 hours and if not improved starting prednisone.    Return if symptoms worsen or fail to improve.  Lesleigh Noe, MD

## 2020-08-13 ENCOUNTER — Other Ambulatory Visit: Payer: Self-pay | Admitting: Family Medicine

## 2020-08-13 LAB — NOVEL CORONAVIRUS, NAA: SARS-CoV-2, NAA: NOT DETECTED

## 2020-08-13 LAB — SPECIMEN STATUS REPORT

## 2020-08-13 LAB — SARS-COV-2, NAA 2 DAY TAT

## 2020-08-13 NOTE — Telephone Encounter (Signed)
Pharmacy requests refill on: Ezetimibe 10 mg   LAST REFILL: 09/16/2019 (Q-90, R-3) LAST OV: 07/20/2020 NEXT OV: Not Scheduled  PHARMACY: [place], [place]

## 2020-08-14 ENCOUNTER — Telehealth: Payer: Self-pay | Admitting: Family Medicine

## 2020-08-14 NOTE — Telephone Encounter (Signed)
Called and spoke with patient who stated that she has cut back to taking 2 pills of the prednisone and she is feeling much better. She stated that she will taper accordingly. Patient verbalized understanding of going to UC or ED if her symptoms worsen or new symptoms develop.

## 2020-08-14 NOTE — Telephone Encounter (Signed)
Called and left voicemail for patient to return call.

## 2020-08-14 NOTE — Telephone Encounter (Signed)
Patient called in stating she is very nauseous, had diarrhea, and other symptoms while on prednisone. Pt stated this has never happened before and is wondering if she should continue to take. Please advise.

## 2020-08-14 NOTE — Telephone Encounter (Signed)
Would recommend stopping and monitoring symptoms. If breathing worsens would recommend urgent care or the ER for re-evaluation.

## 2020-08-20 ENCOUNTER — Telehealth: Payer: Self-pay

## 2020-08-20 NOTE — Progress Notes (Deleted)
Chronic Care Management Pharmacy Assistant   Name: Allison Yoder  MRN: [id number] DOB: 04/24/1951  Reason for Encounter: {Follow-Up Reason:24148}  Patient Questions:  1.  Have you seen any other providers since your last visit? {CHL THN UPSTREAM YES/NO:24149}  2.  Any changes in your medicines or health? {CHL THN UPSTREAM YES/NO:24149}   Allison Yoder,  69 y.o. , female presents for their {Initial/Follow-up:3041532} CCM visit with the clinical pharmacist {CHL HP Upstream Pharm visit MBWG:[id number]}.  PCP : Tonia Ghent, MD  Allergies:   Allergies  Allergen Reactions  . Cephalexin Itching    Does tolerate augmentin  . Codeine Itching and Rash  . Inapsine [Droperidol] Shortness Of Breath, Anxiety and Hypertension    Elevated HR and BP, panic attack  . Crestor [Rosuvastatin]     Aches  . Lipitor [Atorvastatin Calcium]     Myalgias   . Niacin And Related     Flushing  . Pravastatin     Myalgias  . Adhesive [Tape] Rash    Blisters with tape    Medications: Outpatient Encounter Medications as of 08/20/2020  Medication Sig  . albuterol (VENTOLIN HFA) 108 (90 Base) MCG/ACT inhaler INHALE 1 TO 2 PUFFS INTO THE LUNGS EVERY 6 HOURS AS NEEDED FOR WHEEZING OR SHORTNESS OF BREATH  . Alirocumab (PRALUENT) 150 MG/ML SOAJ Inject 1 pen into the skin every 14 (fourteen) days.  . benzonatate (TESSALON) 200 MG capsule Take 1 capsule (200 mg total) by mouth 3 (three) times daily as needed for cough. Swallow whole, do not bite pill  . Blood Glucose Monitoring Suppl (ONE TOUCH ULTRA 2) w/Device KIT Check blood sugar once daily and as instructed. Dx E11.9  . celecoxib (CELEBREX) 100 MG capsule Take 1 capsule (100 mg total) by mouth daily.  . citalopram (CELEXA) 20 MG tablet Take 1 tablet (20 mg total) by mouth daily. Due for physical exam in December.  . clonazePAM (KLONOPIN) 0.5 MG tablet Take 1 tablet (0.5 mg total) by mouth daily as needed for anxiety.  . cyanocobalamin 1000 MCG  tablet Take 1,000 mcg by mouth daily.  . cyclobenzaprine (FLEXERIL) 10 MG tablet Take 1 tablet (10 mg total) by mouth daily as needed for muscle spasms.  Marland Kitchen ezetimibe (ZETIA) 10 MG tablet TAKE 1 TABLET BY MOUTH  DAILY  . fluticasone (FLONASE) 50 MCG/ACT nasal spray Place into both nostrils daily as needed for allergies or rhinitis.  Marland Kitchen glucose blood (ONE TOUCH ULTRA TEST) test strip Check blood sugar once daily and as instructed. Dx E11.9  . levocetirizine (XYZAL) 5 MG tablet Take 5 mg by mouth every evening.  Marland Kitchen lisinopril (ZESTRIL) 10 MG tablet TAKE 1 TABLET BY MOUTH AT  BEDTIME  . metFORMIN (GLUCOPHAGE) 500 MG tablet TAKE 1-2 TABLETS BY MOUTH TWICE DAILY WITH A MEAL  . metoprolol succinate (TOPROL-XL) 50 MG 24 hr tablet Take 1 tablet (50 mg total) by mouth daily. Take with or immediately following a meal.  . Multiple Vitamin (MULTIVITAMIN) tablet Take 1 tablet by mouth daily.  Marland Kitchen NITROFURANTOIN PO Take 100 mg by mouth daily as needed.   [name] DELICA LANCETS 77L MISC Check blood sugar once daily and as instructed. Dx E11.9  . predniSONE (DELTASONE) 10 MG tablet Take 3 tablets (30 mg total) by mouth daily with breakfast for 3 days, THEN 2 tablets (20 mg total) daily with breakfast for 3 days, THEN 1 tablet (10 mg total) daily with breakfast for 3 days.  . Vitamin  D, Cholecalciferol, 1000 units CAPS Take 3,000 Units by mouth daily.   No facility-administered encounter medications on file as of 08/20/2020.    Current Diagnosis: Patient Active Problem List   Diagnosis Date Noted  . Dysphagia 07/22/2020  . Healthcare maintenance 07/22/2019  . Cough 07/22/2019  . Arthralgia 07/22/2019  . Medicare annual wellness visit, initial 06/10/2018  . Other chest pain 06/10/2018  . Acute bronchitis 09/16/2017  . Osteopenia 09/10/2017  . Fatty liver 05/19/2017  . Diabetes mellitus ([place]) 05/05/2017  . Cerebrovascular accident (CVA) due to thrombosis of left posterior cerebral artery ([place]) 04/08/2016   . Hypertensive disorder 10/27/2015  . Advance care planning 04/29/2015  . Obstructive sleep apnea of adult 07/25/2014  . Palpitations 01/06/2013  . Anxiety 09/01/2012  . MDD (major depressive disorder) 12/19/2011  . OA (osteoarthritis) 12/19/2011  . Hyperlipidemia 12/19/2011  . Diverticulitis 12/19/2011  . Migraines 12/19/2011  . Hypothyroid 12/19/2011  . Iritis 12/19/2011    Goals Addressed   None     Follow-Up:  {Upstream CPA Follow-up:24147}

## 2020-08-20 NOTE — Chronic Care Management (AMB) (Signed)
Chronic Care Management Pharmacy Assistant   Name: Allison Yoder  MRN: [id number] DOB: 08-25-1951  Reason for Encounter: Medication Review    PCP : Tonia Ghent, MD  Allergies:   Allergies  Allergen Reactions  . Cephalexin Itching    Does tolerate augmentin  . Codeine Itching and Rash  . Inapsine [Droperidol] Shortness Of Breath, Anxiety and Hypertension    Elevated HR and BP, panic attack  . Crestor [Rosuvastatin]     Aches  . Lipitor [Atorvastatin Calcium]     Myalgias   . Niacin And Related     Flushing  . Pravastatin     Myalgias  . Adhesive [Tape] Rash    Blisters with tape    Medications: Outpatient Encounter Medications as of 08/20/2020  Medication Sig  . albuterol (VENTOLIN HFA) 108 (90 Base) MCG/ACT inhaler INHALE 1 TO 2 PUFFS INTO THE LUNGS EVERY 6 HOURS AS NEEDED FOR WHEEZING OR SHORTNESS OF BREATH  . Alirocumab (PRALUENT) 150 MG/ML SOAJ Inject 1 pen into the skin every 14 (fourteen) days.  . benzonatate (TESSALON) 200 MG capsule Take 1 capsule (200 mg total) by mouth 3 (three) times daily as needed for cough. Swallow whole, do not bite pill  . Blood Glucose Monitoring Suppl (ONE TOUCH ULTRA 2) w/Device KIT Check blood sugar once daily and as instructed. Dx E11.9  . celecoxib (CELEBREX) 100 MG capsule Take 1 capsule (100 mg total) by mouth daily.  . citalopram (CELEXA) 20 MG tablet Take 1 tablet (20 mg total) by mouth daily. Due for physical exam in December.  . clonazePAM (KLONOPIN) 0.5 MG tablet Take 1 tablet (0.5 mg total) by mouth daily as needed for anxiety.  . cyanocobalamin 1000 MCG tablet Take 1,000 mcg by mouth daily.  . cyclobenzaprine (FLEXERIL) 10 MG tablet Take 1 tablet (10 mg total) by mouth daily as needed for muscle spasms.  Marland Kitchen ezetimibe (ZETIA) 10 MG tablet TAKE 1 TABLET BY MOUTH  DAILY  . fluticasone (FLONASE) 50 MCG/ACT nasal spray Place into both nostrils daily as needed for allergies or rhinitis.  Marland Kitchen glucose blood (ONE TOUCH ULTRA  TEST) test strip Check blood sugar once daily and as instructed. Dx E11.9  . levocetirizine (XYZAL) 5 MG tablet Take 5 mg by mouth every evening.  Marland Kitchen lisinopril (ZESTRIL) 10 MG tablet TAKE 1 TABLET BY MOUTH AT  BEDTIME  . metFORMIN (GLUCOPHAGE) 500 MG tablet TAKE 1-2 TABLETS BY MOUTH TWICE DAILY WITH A MEAL  . metoprolol succinate (TOPROL-XL) 50 MG 24 hr tablet Take 1 tablet (50 mg total) by mouth daily. Take with or immediately following a meal.  . Multiple Vitamin (MULTIVITAMIN) tablet Take 1 tablet by mouth daily.  Marland Kitchen NITROFURANTOIN PO Take 100 mg by mouth daily as needed.   [name] DELICA LANCETS 70W MISC Check blood sugar once daily and as instructed. Dx E11.9  . predniSONE (DELTASONE) 10 MG tablet Take 3 tablets (30 mg total) by mouth daily with breakfast for 3 days, THEN 2 tablets (20 mg total) daily with breakfast for 3 days, THEN 1 tablet (10 mg total) daily with breakfast for 3 days.  . Vitamin D, Cholecalciferol, 1000 units CAPS Take 3,000 Units by mouth daily.   No facility-administered encounter medications on file as of 08/20/2020.    Current Diagnosis: Patient Active Problem List   Diagnosis Date Noted  . Dysphagia 07/22/2020  . Healthcare maintenance 07/22/2019  . Cough 07/22/2019  . Arthralgia 07/22/2019  . Medicare annual wellness  visit, initial 06/10/2018  . Other chest pain 06/10/2018  . Acute bronchitis 09/16/2017  . Osteopenia 09/10/2017  . Fatty liver 05/19/2017  . Diabetes mellitus ([place]) 05/05/2017  . Cerebrovascular accident (CVA) due to thrombosis of left posterior cerebral artery ([place]) 04/08/2016  . Hypertensive disorder 10/27/2015  . Advance care planning 04/29/2015  . Obstructive sleep apnea of adult 07/25/2014  . Palpitations 01/06/2013  . Anxiety 09/01/2012  . MDD (major depressive disorder) 12/19/2011  . OA (osteoarthritis) 12/19/2011  . Hyperlipidemia 12/19/2011  . Diverticulitis 12/19/2011  . Migraines 12/19/2011  . Hypothyroid 12/19/2011  .  Iritis 12/19/2011   Have you seen any other providers since your last visit?   ~07/21/20 -Dr. Watt Climes -Gastroenterology  Any changes in your medications or health? She was placed on Nexium for GERD  Any side effects from any medications? No  Do you have an symptoms or problems not managed by your medications? No  Any concerns about your health right now? Not currently  Has your provider asked that you check blood pressure, blood sugar, or follow special diet at home?  States she is supposed to check blood sugar regularly but does not check as often as she should. She states she works at a [employer] and prefers not to stick her finger often  Do you get any type of exercise on a regular basis? No, states she is active and gets quite a bit of exercise at work.   Can you think of a goal you would like to reach for your health? Not at this time  Do you have any problems getting your medications? No  Is there anything that you would like to discuss during the appointment? Patient would like to discuss getting Dexcom or something similar so she does not have to stick her finger.   Patient was reminded to have medications and supplements available to her, for review, at her 08/28/20 telephone appointment with Debbora Dus.   Follow-Up:  Pharmacist Review   Debbora Dus, CPP notified  Margaretmary Dys, [place] Pharmacy Assistant [phone]

## 2020-08-25 ENCOUNTER — Other Ambulatory Visit: Payer: Medicare Other

## 2020-08-27 NOTE — Chronic Care Management (AMB) (Deleted)
Chronic Care Management Pharmacy  Name: MARGARETMARY PRISK  MRN: [id number] DOB: 13-Feb-1951  Chief Complaint/ HPI  [name],  69 y.o. , female presents for their Initial CCM visit with the clinical pharmacist via telephone.   Reviewed initial questions from [facility] on 08/20/20 - new meds Nexium, no medication problems, no health concerns, would like to try Dexcom/Libre. Exercises a lot at work.  PCP : Tonia Ghent, MD  Their chronic conditions include: HTN, CVA, migraines, OSA, fatty liver, hypothyroid, DM, OA, osteopenia, MDD, HLD, anxiety  Office Visits:  07/20/20: PCP - increase metformin to 2 tabs BID, recheck in 3 months, check coverage for Dexcom. GI referral  Consult Visit:  05/28/20: Cardiology phone call - LDL wonderful! Down to 34 on Praluent 159m q 14 days. AST and ALT mildly elevated. Pt has hx of fatty liver. Will repeat in 4 weeks to make sure stable.   04/06/20: Diabetic eye exam   02/27/20: Rheumatology - OA, continue celecoxib, off statin, not sure if much improvement  12/30/19: Cardiology pharmacy clinic - start Praluent   Medications: Outpatient Encounter Medications as of 08/28/2020  Medication Sig  . albuterol (VENTOLIN HFA) 108 (90 Base) MCG/ACT inhaler INHALE 1 TO 2 PUFFS INTO THE LUNGS EVERY 6 HOURS AS NEEDED FOR WHEEZING OR SHORTNESS OF BREATH  . Alirocumab (PRALUENT) 150 MG/ML SOAJ Inject 1 pen into the skin every 14 (fourteen) days.  . benzonatate (TESSALON) 200 MG capsule Take 1 capsule (200 mg total) by mouth 3 (three) times daily as needed for cough. Swallow whole, do not bite pill  . Blood Glucose Monitoring Suppl (ONE TOUCH ULTRA 2) w/Device KIT Check blood sugar once daily and as instructed. Dx E11.9  . celecoxib (CELEBREX) 100 MG capsule Take 1 capsule (100 mg total) by mouth daily.  . citalopram (CELEXA) 20 MG tablet Take 1 tablet (20 mg total) by mouth daily. Due for physical exam in December.  . clonazePAM (KLONOPIN) 0.5 MG tablet Take 1  tablet (0.5 mg total) by mouth daily as needed for anxiety.  . cyanocobalamin 1000 MCG tablet Take 1,000 mcg by mouth daily.  . cyclobenzaprine (FLEXERIL) 10 MG tablet Take 1 tablet (10 mg total) by mouth daily as needed for muscle spasms.  .Marland Kitchenezetimibe (ZETIA) 10 MG tablet TAKE 1 TABLET BY MOUTH  DAILY  . fluticasone (FLONASE) 50 MCG/ACT nasal spray Place into both nostrils daily as needed for allergies or rhinitis.  .Marland Kitchenglucose blood (ONE TOUCH ULTRA TEST) test strip Check blood sugar once daily and as instructed. Dx E11.9  . levocetirizine (XYZAL) 5 MG tablet Take 5 mg by mouth every evening.  .Marland Kitchenlisinopril (ZESTRIL) 10 MG tablet TAKE 1 TABLET BY MOUTH AT  BEDTIME  . metFORMIN (GLUCOPHAGE) 500 MG tablet TAKE 1-2 TABLETS BY MOUTH TWICE DAILY WITH A MEAL  . metoprolol succinate (TOPROL-XL) 50 MG 24 hr tablet Take 1 tablet (50 mg total) by mouth daily. Take with or immediately following a meal.  . Multiple Vitamin (MULTIVITAMIN) tablet Take 1 tablet by mouth daily.  .Marland KitchenNITROFURANTOIN PO Take 100 mg by mouth daily as needed.   .Glory RosebushDELICA LANCETS 309XMISC Check blood sugar once daily and as instructed. Dx E11.9  . Vitamin D, Cholecalciferol, 1000 units CAPS Take 3,000 Units by mouth daily.   No facility-administered encounter medications on file as of 08/28/2020.     Current Diagnosis/Assessment:  Goals Addressed   None    Hypertension   CMP Latest Ref  Rng & Units 06/18/2020 05/27/2020 12/30/2019  Glucose 70 - 99 mg/dL - - -  BUN 6 - 23 mg/dL - - -  Creatinine 0.40 - 1.20 mg/dL - - -  Sodium 135 - 145 mEq/L - - -  Potassium 3.5 - 5.1 mEq/L - - -  Chloride 96 - 112 mEq/L - - -  CO2 19 - 32 mEq/L - - -  Calcium 8.4 - 10.5 mg/dL - - -  Total Protein 6.0 - 8.5 g/dL 6.5 6.7 6.5  Total Bilirubin 0.0 - 1.2 mg/dL 0.4 0.5 0.3  Alkaline Phos 44 - 121 IU/L 136(H) 119 117  AST 0 - 40 IU/L 52(H) 59(H) 37  ALT 0 - 32 IU/L 80(H) 81(H) 47(H)   Office blood pressures are: BP Readings from Last  3 Encounters:  08/11/20 (!) 161/91  07/20/20 (!) 142/80  12/25/19 (!) 146/78   BP today is:  {CHL HP UPSTREAM Pharmacist BP ranges:[phone]}  Patient has failed these meds in the past:  Patient checks BP at home {CHL HP BP Monitoring Frequency:[phone]} Patient home BP readings are ranging:  OSA -   Patient is currently {CHL Controlled/[phone]} on the following medications:   Lisinopril 10 mg - 1 daily  Metoprolol succinate 50 mg  - 1 daily  We discussed:  {CHL HP Upstream Pharmacy [phone]}  Plan: Continue {CHL HP Upstream Pharmacy Plans:[phone]}  {CHL HP Upstream Pharmacy Diagnosis/Assessment:[phone]}   Hyperlipidemia   LDL goal < 70  Last lipids Lab Results  Component Value Date   CHOL 96 (L) 05/27/2020   HDL 39 (L) 05/27/2020   LDLCALC 34 05/27/2020   LDLDIRECT 66.0 08/15/2017   TRIG 133 05/27/2020   CHOLHDL 2.5 05/27/2020   Hepatic Function Latest Ref Rng & Units 06/18/2020 05/27/2020 12/30/2019  Total Protein 6.0 - 8.5 g/dL 6.5 6.7 6.5  Albumin 3.8 - 4.8 g/dL 4.2 4.1 4.2  AST 0 - 40 IU/L 52(H) 59(H) 37  ALT 0 - 32 IU/L 80(H) 81(H) 47(H)  Alk Phosphatase 44 - 121 IU/L 136(H) 119 117  Total Bilirubin 0.0 - 1.2 mg/dL 0.4 0.5 0.3  Bilirubin, Direct 0.00 - 0.40 mg/dL 0.13 0.17 0.10     The ASCVD Risk score Mikey Bussing DC Jr., et al., 2013) failed to calculate for the following reasons:   The patient has a prior MI or stroke diagnosis   Patient has failed these meds in past: Simvastatin (myalgias), atorvastatin 47m (myalgias) rosuvastatin 578mdaily (myalgias, fatigue) Patient is currently {CHL Controlled/[phone]} on the following medications:  . Zetia 10 mg - 1 tablet daily . Praluent  - inject every 14 days  ([employer] grant)  We discussed:  {CHL HP Upstream Pharmacy [phone]}  Plan  Continue {CHL HP Upstream Pharmacy Plans:[phone]}  Diabetes   Recent Relevant Labs: Lab Results   Component Value Date/Time   HGBA1C 8.8 (A) 07/20/2020 04:17 PM   HGBA1C 7.5 (A) 09/03/2019 09:04 AM   HGBA1C 8.2 (H) 05/31/2019 09:28 AM   HGBA1C 7.6 (H) 04/10/2018 08:13 AM    Pt was on prednisone 08/01/20 - short course 9 days   Checking BG: {CHL HP Blood Glucose Monitoring Frequency:[phone]}  Recent FBG Readings: Recent pre-meal BG readings: *** Recent 2hr PP BG readings:  *** Recent HS BG readings: *** Patient has failed these meds in past: *** Patient is currently {CHL Controlled/[phone]} on the following medications:   Metformin 500 mg - 2 tablets BID  Last diabetic Foot exam:  Lab Results  Component Value Date/Time  HMDIABEYEEXA No Retinopathy 04/06/2020 12:00 AM    Last diabetic Eye exam: No results found for: HMDIABFOOTEX   We discussed: {CHL HP Upstream Pharmacy discussion:[phone]}   Diet: breakfast: granolla bar or belveeta bar, coffee or tea or eggs whites with spinach/veggies Lunch: apple pecans, cheese, ham sandwich whole wheat bread, salads Dinner: romaine lettuce w/ mandarin oranges, strawberries, pecans, chicken feta (small amount) poppy seed dressing, steak twice a month, pork chops, scrambled eggs, yogurt, [place] food twice a month Snacks: potato chips Drinks: lactacid non fat milk, water, un sweet tea  Exercise: walks about a mile with grandchild  Plan  Continue {CHL HP Upstream Pharmacy UXNAT:[id number]}   GERD   Patient has failed these meds in past:  Patient is currently controlled on the following medications:   Omeprazole 40 mg - 1 tablet daily before breakfast   Assessment:  . Symptoms:  . Duration of therapy: . Pertinent history: None of the following noted --> GI bleed, Barrett's esophagus, severe esophagitis, long-term NSAID use or DAPT with additional bleeding risk . Risks of long-term PPI therapy reviewed: B12 deficiency, hypomagnesemia, pneumonia, fractures, enteric infections  Plan: Continue current  therapy. Consider PPI taper annually.  Plan: Recommend beginning PPI taper. Will consult with PCP. Monitor symptoms.   Osteoarthritis   Patient has failed these meds in past:  Patient is currently {CHL Controlled/Uncontrolled:[id number]} on the following medications:   Celecoxib 200 mg - 1 daily PRN  Cyclobenzaprine 10 mg - 1 tablet daily PRN  We discussed:   Plan: Continue {CHL HP Upstream Pharmacy Plans:[phone]}   Hypothyroidism   Lab Results  Component Value Date/Time   TSH 2.95 05/31/2019 09:28 AM   TSH 2.28 06/08/2018 01:27 PM   FREET4 0.93 06/02/2016 11:57 AM   Patient has failed these meds in past: *** Patient is currently {CHL Controlled/Uncontrolled:6062629826} on the following medications:  . No pharmacotherapy  We discussed:  {CHL HP Upstream Pharmacy discussion:[phone]}  Plan  Continue {CHL HP Upstream Pharmacy YHCWC:[id number]}  Medication Management   Patient's preferred pharmacy is:  [facility], [address], [address] [place], [place] [address] Phone: [phone] Fax: [facility], [place] [address] Phone: [phone] Fax: [phone]  Uses pill box? {Yes or If no, why not?:20788} Pt endorses ***% compliance  We discussed: {Pharmacy options:24294}  Plan  {US Pharmacy PZWC:58527}  Follow up: *** month phone visit  Debbora Dus, PharmD Clinical Pharmacist [facility] [phone]

## 2020-08-28 ENCOUNTER — Telehealth: Payer: Medicare Other

## 2020-08-28 ENCOUNTER — Ambulatory Visit: Payer: Self-pay

## 2020-08-28 NOTE — Chronic Care Management (AMB) (Signed)
  Chronic Care Management   Outreach Note  08/28/2020 Name: ADAYA GARMANY MRN: [id number] DOB: 02-Aug-1951  PCP: Tonia Ghent, MD  Attempted to reach patient for initial CCM visit 12/17 at 55 AM. Left voicemail with contact information for rescheduling.  Debbora Dus, PharmD Clinical Pharmacist [facility] [phone]

## 2020-08-31 ENCOUNTER — Other Ambulatory Visit: Payer: Self-pay | Admitting: Family Medicine

## 2020-09-02 ENCOUNTER — Telehealth: Payer: Medicare Other

## 2020-09-02 NOTE — Chronic Care Management (AMB) (Unsigned)
Chronic Care Management Pharmacy  Name: Allison Yoder  MRN: [id number] DOB: [birth year]  Chief Complaint/ HPI  Allison Yoder,  69 y.o. , female presents for their Initial CCM visit with the clinical pharmacist via telephone.   Reviewed initial questions from [place] on 08/20/20 - new meds Nexium, no medication problems, no health concerns, would like to try Dexcom/Libre. Exercises a lot at work.  PCP : Tonia Ghent, MD  Their chronic conditions include: HTN, CVA, migraines, OSA, fatty liver, hypothyroid, DM, OA, osteopenia, MDD, HLD, anxiety  Office Visits:  07/20/20: PCP - increase metformin to 2 tabs BID, recheck in 3 months, check coverage for Dexcom. GI referral  Consult Visit:  05/28/20: Cardiology phone call - LDL wonderful! Down to 34 on Praluent 111m q 14 days. AST and ALT mildly elevated. Pt has hx of fatty liver. Will repeat in 4 weeks to make sure stable.   04/06/20: Diabetic eye exam   02/27/20: Rheumatology - OA, continue celecoxib, off statin, not sure if much improvement  12/30/19: Cardiology pharmacy clinic - start Praluent   Medications: Outpatient Encounter Medications as of 09/02/2020  Medication Sig  . albuterol (VENTOLIN HFA) 108 (90 Base) MCG/ACT inhaler INHALE 1 TO 2 PUFFS INTO THE LUNGS EVERY 6 HOURS AS NEEDED FOR WHEEZING OR SHORTNESS OF BREATH  . Alirocumab (PRALUENT) 150 MG/ML SOAJ Inject 1 pen into the skin every 14 (fourteen) days.  . benzonatate (TESSALON) 200 MG capsule Take 1 capsule (200 mg total) by mouth 3 (three) times daily as needed for cough. Swallow whole, do not bite pill  . Blood Glucose Monitoring Suppl (ONE TOUCH ULTRA 2) w/Device KIT Check blood sugar once daily and as instructed. Dx E11.9  . celecoxib (CELEBREX) 100 MG capsule Take 1 capsule (100 mg total) by mouth daily.  . citalopram (CELEXA) 20 MG tablet TAKE 1 TABLET BY MOUTH  DAILY  . clonazePAM (KLONOPIN) 0.5 MG tablet Take 1 tablet (0.5 mg total) by mouth daily as needed for  anxiety.  . cyanocobalamin 1000 MCG tablet Take 1,000 mcg by mouth daily.  . cyclobenzaprine (FLEXERIL) 10 MG tablet Take 1 tablet (10 mg total) by mouth daily as needed for muscle spasms.  .Marland Kitchenezetimibe (ZETIA) 10 MG tablet TAKE 1 TABLET BY MOUTH  DAILY  . fluticasone (FLONASE) 50 MCG/ACT nasal spray Place into both nostrils daily as needed for allergies or rhinitis.  .Marland Kitchenglucose blood (ONE TOUCH ULTRA TEST) test strip Check blood sugar once daily and as instructed. Dx E11.9  . levocetirizine (XYZAL) 5 MG tablet Take 5 mg by mouth every evening.  .Marland Kitchenlisinopril (ZESTRIL) 10 MG tablet TAKE 1 TABLET BY MOUTH AT  BEDTIME  . metFORMIN (GLUCOPHAGE) 500 MG tablet TAKE 1-2 TABLETS BY MOUTH TWICE DAILY WITH A MEAL  . metoprolol succinate (TOPROL-XL) 50 MG 24 hr tablet Take 1 tablet (50 mg total) by mouth daily. Take with or immediately following a meal.  . Multiple Vitamin (MULTIVITAMIN) tablet Take 1 tablet by mouth daily.  .Marland KitchenNITROFURANTOIN PO Take 100 mg by mouth daily as needed.   .Glory RosebushDELICA LANCETS 356LMISC Check blood sugar once daily and as instructed. Dx E11.9  . Vitamin D, Cholecalciferol, 1000 units CAPS Take 3,000 Units by mouth daily.   No facility-administered encounter medications on file as of 09/02/2020.    Current Diagnosis/Assessment:  Goals Addressed   None    Hypertension   CMP Latest Ref Rng & Units 06/18/2020 05/27/2020 12/30/2019  Glucose 70 -  99 mg/dL - - -  BUN 6 - 23 mg/dL - - -  Creatinine 0.40 - 1.20 mg/dL - - -  Sodium 135 - 145 mEq/L - - -  Potassium 3.5 - 5.1 mEq/L - - -  Chloride 96 - 112 mEq/L - - -  CO2 19 - 32 mEq/L - - -  Calcium 8.4 - 10.5 mg/dL - - -  Total Protein 6.0 - 8.5 g/dL 6.5 6.7 6.5  Total Bilirubin 0.0 - 1.2 mg/dL 0.4 0.5 0.3  Alkaline Phos 44 - 121 IU/L 136(H) 119 117  AST 0 - 40 IU/L 52(H) 59(H) 37  ALT 0 - 32 IU/L 80(H) 81(H) 47(H)   Office blood pressures are: BP Readings from Last 3 Encounters:  08/11/20 (!) 161/91  07/20/20 (!)  142/80  12/25/19 (!) 146/78   BP today is:  {CHL HP UPSTREAM Pharmacist BP ranges:[phone]}  Patient has failed these meds in the past:  Patient checks BP at home {CHL HP BP Monitoring Frequency:[id number]} Patient home BP readings are ranging:  OSA -   Patient is currently {CHL Controlled/Uncontrolled:[phone]} on the following medications:   Lisinopril 10 mg - 1 daily  Metoprolol succinate 50 mg  - 1 daily  We discussed:  {CHL HP Upstream Pharmacy [phone]}  Plan: Continue {CHL HP Upstream Pharmacy Plans:[phone]}  {CHL HP Upstream Pharmacy Diagnosis/Assessment:7030752066}   Hyperlipidemia   LDL goal < 70  Last lipids Lab Results  Component Value Date   CHOL 96 (L) 05/27/2020   HDL 39 (L) 05/27/2020   LDLCALC 34 05/27/2020   LDLDIRECT 66.0 08/15/2017   TRIG 133 05/27/2020   CHOLHDL 2.5 05/27/2020   Hepatic Function Latest Ref Rng & Units 06/18/2020 05/27/2020 12/30/2019  Total Protein 6.0 - 8.5 g/dL 6.5 6.7 6.5  Albumin 3.8 - 4.8 g/dL 4.2 4.1 4.2  AST 0 - 40 IU/L 52(H) 59(H) 37  ALT 0 - 32 IU/L 80(H) 81(H) 47(H)  Alk Phosphatase 44 - 121 IU/L 136(H) 119 117  Total Bilirubin 0.0 - 1.2 mg/dL 0.4 0.5 0.3  Bilirubin, Direct 0.00 - 0.40 mg/dL 0.13 0.17 0.10     The ASCVD Risk score Mikey Bussing DC Jr., et al., 2013) failed to calculate for the following reasons:   The patient has a prior MI or stroke diagnosis   Patient has failed these meds in past: Simvastatin (myalgias), atorvastatin 18m (myalgias) rosuvastatin 531mdaily (myalgias, fatigue) Patient is currently {CHL Controlled/Uncontrolled:[phone]} on the following medications:  . Zetia 10 mg - 1 tablet daily . Praluent  - inject every 14 days  ([employer] grant)  We discussed:  {CHL HP Upstream Pharmacy [phone]}  Plan  Continue {CHL HP Upstream Pharmacy Plans:[phone]}  Diabetes   Recent Relevant Labs: Lab Results  Component Value Date/Time   HGBA1C 8.8 (A) 07/20/2020  04:17 PM   HGBA1C 7.5 (A) 09/03/2019 09:04 AM   HGBA1C 8.2 (H) 05/31/2019 09:28 AM   HGBA1C 7.6 (H) 04/10/2018 08:13 AM    Pt was on prednisone 08/01/20 - short course 9 days   Checking BG: {CHL HP Blood Glucose Monitoring [phone]}  Recent FBG Readings: Recent pre-meal BG readings: *** Recent 2hr PP BG readings:  *** Recent HS BG readings: *** Patient has failed these meds in past: *** Patient is currently {CHL Controlled/Uncontrolled:[phone]} on the following medications:   Metformin 500 mg - 2 tablets BID  Last diabetic Foot exam:  Lab Results  Component Value Date/Time   HMDIABEYEEXA No Retinopathy 04/06/2020 12:00 AM    Last  diabetic Eye exam: No results found for: HMDIABFOOTEX   We discussed: {CHL HP Upstream Pharmacy [phone]}   Diet: breakfast: granolla bar or belveeta bar, coffee or tea or eggs whites with spinach/veggies Lunch: apple pecans, cheese, ham sandwich whole wheat bread, salads Dinner: romaine lettuce w/ mandarin oranges, strawberries, pecans, chicken feta (small amount) poppy seed dressing, steak twice a month, pork chops, scrambled eggs, yogurt, [place] food twice a month Snacks: potato chips Drinks: lactacid non fat milk, water, un sweet tea  Exercise: walks about a mile with grandchild  Plan  Continue {CHL HP Upstream Pharmacy OVZCH:[phone]}   GERD   Patient has failed these meds in past:  Patient is currently controlled on the following medications:   Omeprazole 40 mg - 1 tablet daily before breakfast   Assessment:  . Symptoms:  . Duration of therapy: . Pertinent history: None of the following noted --> GI bleed, Barrett's esophagus, severe esophagitis, long-term NSAID use or DAPT with additional bleeding risk . Risks of long-term PPI therapy reviewed: B12 deficiency, hypomagnesemia, pneumonia, fractures, enteric infections  Plan: Continue current therapy. Consider PPI taper annually.  Plan: Recommend  beginning PPI taper. Will consult with PCP. Monitor symptoms.   Osteoarthritis   Patient has failed these meds in past:  Patient is currently {CHL Controlled/[phone]} on the following medications:   Celecoxib 200 mg - 1 daily PRN  Cyclobenzaprine 10 mg - 1 tablet daily PRN  We discussed:   Plan: Continue {CHL HP Upstream Pharmacy [phone]}   Hypothyroidism   Lab Results  Component Value Date/Time   TSH 2.95 05/31/2019 09:28 AM   TSH 2.28 06/08/2018 01:27 PM   FREET4 0.93 06/02/2016 11:57 AM   Patient has failed these meds in past: *** Patient is currently {CHL Controlled/[phone]} on the following medications:  . No pharmacotherapy  We discussed:  {CHL HP Upstream Pharmacy [phone]}  Plan  Continue {CHL HP Upstream Pharmacy INOMV:[id number]}  Medication Management   Patient's preferred pharmacy is:  [facility], [address], [address] [place], [place] [address] Phone: [phone] Fax: [place] [address], [place] [address] Phone: [phone] Fax: [phone]  Uses pill box? {Yes or If no, why not?:20788} Pt endorses ***% compliance  We discussed: {Pharmacy options:24294}  Plan  {US Pharmacy ZLDJ:57017}  Follow up: *** month phone visit  Debbora Dus, PharmD Clinical Pharmacist [facility] [phone]

## 2020-09-08 ENCOUNTER — Telehealth: Payer: Self-pay

## 2020-09-08 NOTE — Chronic Care Management (AMB) (Signed)
Chronic Care Management Pharmacy Assistant   Name: Allison Yoder  MRN: [id number] DOB: [birth year]  Called patient to reschedule her initial visit with Debbora Dus, Pharm D. Patient now scheduled for 09/25/2020 at 1:15pm. Patient is aware this will be a phone visit.    PCP : Tonia Ghent, MD  Allergies:   Allergies  Allergen Reactions  . Cephalexin Itching    Does tolerate augmentin  . Codeine Itching and Rash  . Inapsine [Droperidol] Shortness Of Breath, Anxiety and Hypertension    Elevated HR and BP, panic attack  . Crestor [Rosuvastatin]     Aches  . Lipitor [Atorvastatin Calcium]     Myalgias   . Niacin And Related     Flushing  . Pravastatin     Myalgias  . Adhesive [Tape] Rash    Blisters with tape    Medications: Outpatient Encounter Medications as of 09/08/2020  Medication Sig  . albuterol (VENTOLIN HFA) 108 (90 Base) MCG/ACT inhaler INHALE 1 TO 2 PUFFS INTO THE LUNGS EVERY 6 HOURS AS NEEDED FOR WHEEZING OR SHORTNESS OF BREATH  . Alirocumab (PRALUENT) 150 MG/ML SOAJ Inject 1 pen into the skin every 14 (fourteen) days.  . benzonatate (TESSALON) 200 MG capsule Take 1 capsule (200 mg total) by mouth 3 (three) times daily as needed for cough. Swallow whole, do not bite pill  . Blood Glucose Monitoring Suppl (ONE TOUCH ULTRA 2) w/Device KIT Check blood sugar once daily and as instructed. Dx E11.9  . celecoxib (CELEBREX) 100 MG capsule Take 1 capsule (100 mg total) by mouth daily.  . citalopram (CELEXA) 20 MG tablet TAKE 1 TABLET BY MOUTH  DAILY  . clonazePAM (KLONOPIN) 0.5 MG tablet Take 1 tablet (0.5 mg total) by mouth daily as needed for anxiety.  . cyanocobalamin 1000 MCG tablet Take 1,000 mcg by mouth daily.  . cyclobenzaprine (FLEXERIL) 10 MG tablet Take 1 tablet (10 mg total) by mouth daily as needed for muscle spasms.  Marland Kitchen ezetimibe (ZETIA) 10 MG tablet TAKE 1 TABLET BY MOUTH  DAILY  . fluticasone (FLONASE) 50 MCG/ACT nasal spray Place into both nostrils  daily as needed for allergies or rhinitis.  Marland Kitchen glucose blood (ONE TOUCH ULTRA TEST) test strip Check blood sugar once daily and as instructed. Dx E11.9  . levocetirizine (XYZAL) 5 MG tablet Take 5 mg by mouth every evening.  Marland Kitchen lisinopril (ZESTRIL) 10 MG tablet TAKE 1 TABLET BY MOUTH AT  BEDTIME  . metFORMIN (GLUCOPHAGE) 500 MG tablet TAKE 1-2 TABLETS BY MOUTH TWICE DAILY WITH A MEAL  . metoprolol succinate (TOPROL-XL) 50 MG 24 hr tablet Take 1 tablet (50 mg total) by mouth daily. Take with or immediately following a meal.  . Multiple Vitamin (MULTIVITAMIN) tablet Take 1 tablet by mouth daily.  Marland Kitchen NITROFURANTOIN PO Take 100 mg by mouth daily as needed.   [name] DELICA LANCETS 95G MISC Check blood sugar once daily and as instructed. Dx E11.9  . Vitamin D, Cholecalciferol, 1000 units CAPS Take 3,000 Units by mouth daily.   No facility-administered encounter medications on file as of 09/08/2020.    Current Diagnosis: Patient Active Problem List   Diagnosis Date Noted  . Dysphagia 07/22/2020  . Healthcare maintenance 07/22/2019  . Cough 07/22/2019  . Arthralgia 07/22/2019  . Medicare annual wellness visit, initial 06/10/2018  . Other chest pain 06/10/2018  . Acute bronchitis 09/16/2017  . Osteopenia 09/10/2017  . Fatty liver 05/19/2017  . Diabetes mellitus ([place]) 05/05/2017  .  Cerebrovascular accident (CVA) due to thrombosis of left posterior cerebral artery ([place]) 04/08/2016  . Hypertensive disorder 10/27/2015  . Advance care planning 04/29/2015  . Obstructive sleep apnea of adult 07/25/2014  . Palpitations 01/06/2013  . Anxiety 09/01/2012  . MDD (major depressive disorder) 12/19/2011  . OA (osteoarthritis) 12/19/2011  . Hyperlipidemia 12/19/2011  . Diverticulitis 12/19/2011  . Migraines 12/19/2011  . Hypothyroid 12/19/2011  . Iritis 12/19/2011     Follow-Up:  Pharmacist Review and Scheduled Follow-Up With Clinical Pharmacist   Debbora Dus, CPP notified  Margaretmary Dys, [place] [facility] Assistant [phone]

## 2020-09-17 ENCOUNTER — Other Ambulatory Visit: Payer: Self-pay | Admitting: Family Medicine

## 2020-09-25 ENCOUNTER — Telehealth: Payer: Self-pay

## 2020-09-25 ENCOUNTER — Ambulatory Visit: Payer: Medicare Other

## 2020-09-25 ENCOUNTER — Other Ambulatory Visit: Payer: Self-pay

## 2020-09-25 DIAGNOSIS — I10 Essential (primary) hypertension: Secondary | ICD-10-CM

## 2020-09-25 DIAGNOSIS — E119 Type 2 diabetes mellitus without complications: Secondary | ICD-10-CM

## 2020-09-25 NOTE — Chronic Care Management (AMB) (Signed)
Chronic Care Management Pharmacy  Name: Allison Yoder  MRN: [id number] DOB: [birth year]  Chief Complaint/ HPI  Allison Yoder,  70 y.o. , female presents for their Initial CCM visit with the clinical pharmacist via telephone.   Reviewed initial questions from Allison Yoder on 08/20/20 - no medication problems, no health concerns, would like to try Dexcom/Libre.   PCP : Allison Ghent, MD  Their chronic conditions include: HTN, CVA, migraines, OSA, fatty liver, hypothyroid, DM, OA, osteopenia, MDD, HLD, anxiety  Office Visits:  07/20/20: PCP - increase metformin to 2 tabs BID, recheck in 3 months, check coverage for Dexcom. GI referral  Consult Visit:  05/28/20: Cardiology phone call - LDL wonderful! Down to 34 on Praluent 126m q 14 days. AST and ALT mildly elevated. Pt has hx of fatty liver. Will repeat in 4 weeks to make sure stable.   04/06/20: Diabetic eye exam   02/27/20: Rheumatology - OA, continue celecoxib, off statin, not sure if much improvement  12/30/19: Cardiology pharmacy clinic - start Praluent   Pt reports seeing GI - Allison Yoder. Surgery is recommended but she is not able to do it at this time with young child at home.  Allergies  Allergen Reactions  . Cephalexin Itching    Does tolerate augmentin  . Codeine Itching and Rash  . Inapsine [Droperidol] Shortness Of Breath, Anxiety and Hypertension    Elevated HR and BP, panic attack  . Crestor [Rosuvastatin]     Aches  . Lipitor [Atorvastatin Calcium]     Myalgias   . Niacin And Related     Flushing  . Pravastatin     Myalgias  . Adhesive [Tape] Rash    Blisters with tape   Medications: Outpatient Encounter Medications as of 09/25/2020  Medication Sig  . albuterol (VENTOLIN HFA) 108 (90 Base) MCG/ACT inhaler INHALE 1 TO 2 PUFFS INTO THE LUNGS EVERY 6 HOURS AS NEEDED FOR WHEEZING OR SHORTNESS OF BREATH  . Alirocumab (PRALUENT) 150 MG/ML SOAJ Inject 1 pen into the skin every 14 (fourteen) days.   . benzonatate (TESSALON) 200 MG capsule Take 1 capsule (200 mg total) by mouth 3 (three) times daily as needed for cough. Swallow whole, do not bite pill  . Blood Glucose Monitoring Suppl (ONE TOUCH ULTRA 2) w/Device KIT Check blood sugar once daily and as instructed. Dx E11.9  . celecoxib (CELEBREX) 100 MG capsule Take 1 capsule (100 mg total) by mouth daily.  . citalopram (CELEXA) 20 MG tablet TAKE 1 TABLET BY MOUTH  DAILY  . clonazePAM (KLONOPIN) 0.5 MG tablet Take 1 tablet (0.5 mg total) by mouth daily as needed for anxiety.  . cyanocobalamin 1000 MCG tablet Take 1,000 mcg by mouth daily.  . cyclobenzaprine (FLEXERIL) 10 MG tablet Take 1 tablet (10 mg total) by mouth daily as needed for muscle spasms.  .Marland Kitchenezetimibe (ZETIA) 10 MG tablet TAKE 1 TABLET BY MOUTH  DAILY  . fluticasone (FLONASE) 50 MCG/ACT nasal spray Place into both nostrils daily as needed for allergies or rhinitis.  .Marland Kitchenglucose blood (ONE TOUCH ULTRA TEST) test strip Check blood sugar once daily and as instructed. Dx E11.9  . levocetirizine (XYZAL) 5 MG tablet Take 5 mg by mouth every evening.  .Marland Kitchenlisinopril (ZESTRIL) 10 MG tablet TAKE 1 TABLET BY MOUTH AT  BEDTIME  . metFORMIN (GLUCOPHAGE) 500 MG tablet TAKE 1-2 TABLETS BY MOUTH TWICE DAILY WITH A MEAL  . metoprolol succinate (TOPROL-XL) 50 MG 24 hr tablet  TAKE 1 TABLET BY MOUTH  DAILY WITH OR IMMEDIATELY  FOLLOWING A MEAL  . Multiple Vitamin (MULTIVITAMIN) tablet Take 1 tablet by mouth daily.  Marland Kitchen NITROFURANTOIN PO Take 100 mg by mouth daily as needed.   [name] DELICA LANCETS 62I MISC Check blood sugar once daily and as instructed. Dx E11.9  . Vitamin D, Cholecalciferol, 1000 units CAPS Take 3,000 Units by mouth daily.   No facility-administered encounter medications on file as of 09/25/2020.    Current Diagnosis/Assessment:  SDOH Interventions   Flowsheet Row Most Recent Value  SDOH Interventions   Financial Strain Interventions Intervention Not Indicated  [Medications  affordable]     Goals Addressed            This Visit's Progress   . Pharmacy Care Plan       CARE PLAN ENTRY (see longitudinal plan of care for additional care plan information)  Current Barriers:  . Chronic Disease Management support, education, and care coordination needs related to Hypertension and Diabetes   Hypertension BP Readings from Last 3 Encounters:  08/11/20 (!) 161/91  07/20/20 (!) 142/80  12/25/19 (!) 146/78   . Pharmacist Clinical Goal(s): o Over the next 3 months, patient will work with PharmD and providers to achieve BP goal <130/80 mmHg . Current regimen:   Lisinopril 10 mg - 1 daily  Metoprolol succinate 50 mg  - 1 daily . Interventions: o Recommend checking blood pressure at home with arm cuff this week for CMA to call and review  . Patient self care activities - Over the next 3 months, patient will: o Check BP several days this week, document, and provide at follow up call o Limit salt intake   Diabetes Lab Results  Component Value Date/Time   HGBA1C 8.8 (A) 07/20/2020 04:17 PM   HGBA1C 7.5 (A) 09/03/2019 09:04 AM   HGBA1C 8.2 (H) 05/31/2019 09:28 AM   HGBA1C 7.6 (H) 04/10/2018 08:13 AM   . Pharmacist Clinical Goal(s): o Over the next 3 months, patient will work with PharmD and providers to achieve A1c goal <7% . Current regimen:  o Metformin 500 mg - 2 tablets in AM and 1 at night  . Interventions: o Recommend checking home BG with Dexcom sample o Coordinate BG log review in 2 weeks o Send Dexcom prescription to [facility] to verify benefits  . Patient self care activities - Over the next 3 months, patient will: o Check blood sugar in the morning before eating or drinking, document, and provide at future appointments o Contact provider with any episodes of hypoglycemia  Initial goal documentation      Hypertension   CMP Latest Ref Rng & Units 06/18/2020 05/27/2020 12/30/2019  Glucose 70 - 99 mg/dL - - -  BUN 6 - 23 mg/dL - - -  Creatinine  0.40 - 1.20 mg/dL - - -  Sodium 135 - 145 mEq/L - - -  Potassium 3.5 - 5.1 mEq/L - - -  Chloride 96 - 112 mEq/L - - -  CO2 19 - 32 mEq/L - - -  Calcium 8.4 - 10.5 mg/dL - - -  Total Protein 6.0 - 8.5 g/dL 6.5 6.7 6.5  Total Bilirubin 0.0 - 1.2 mg/dL 0.4 0.5 0.3  Alkaline Phos 44 - 121 IU/L 136(H) 119 117  AST 0 - 40 IU/L 52(H) 59(H) 37  ALT 0 - 32 IU/L 80(H) 81(H) 47(H)   Office blood pressures are: BP Readings from Last 3 Encounters:  08/11/20 (!) 161/91  07/20/20 (!) 142/80  12/25/19 (!) 146/78   BP goal < 130/80 mmHg  Patient has failed these meds in the past: Atenolol  Patient checks BP at home infrequently, pt has arm and wrist monitor, last check of 161/91 was at home with wrist cuff  Patient home BP readings are ranging: none reported OSA - Currently wearing nightly  Patient is currently uncontrolled on the following medications:   Lisinopril 10 mg - 1 daily  Metoprolol succinate 50 mg  - 1 daily  We discussed: Patient is concerned about her BP. We discussed goals including < 140/90 as general goal and < 130/80 if able without side effects due to CV history. Pt is interested in increasing lisinopril. Recommend monitor BP with arm cuff this week. CMA will call for home BP log.   Plan: Continue current medications; Recommend monitor BP with arm cuff this week. CMA will call for home BP log in ~ 2 weeks.   Hyperlipidemia   LDL goal < 70  Last lipids Lab Results  Component Value Date   CHOL 96 (L) 05/27/2020   HDL 39 (L) 05/27/2020   LDLCALC 34 05/27/2020   LDLDIRECT 66.0 08/15/2017   TRIG 133 05/27/2020   CHOLHDL 2.5 05/27/2020   Hepatic Function Latest Ref Rng & Units 06/18/2020 05/27/2020 12/30/2019  Total Protein 6.0 - 8.5 g/dL 6.5 6.7 6.5  Albumin 3.8 - 4.8 g/dL 4.2 4.1 4.2  AST 0 - 40 IU/L 52(H) 59(H) 37  ALT 0 - 32 IU/L 80(H) 81(H) 47(H)  Alk Phosphatase 44 - 121 IU/L 136(H) 119 117  Total Bilirubin 0.0 - 1.2 mg/dL 0.4 0.5 0.3  Bilirubin, Direct 0.00 -  0.40 mg/dL 0.13 0.17 0.10    The ASCVD Risk score ([place]., et al., 2013) failed to calculate for the following reasons:   The patient has a prior MI or stroke diagnosis   Patient has failed these meds in past: Simvastatin (myalgias), atorvastatin 24m (myalgias) rosuvastatin 549mdaily (myalgias, fatigue) Patient is currently controlled on the following medications:  . Zetia 10 mg - 1 tablet daily . Praluent - Inject every 14 days  ([employer] grant)  We discussed: Reports Praluent and Zetia are working very well, denies adverse effects.   Plan: Continue current medications  Diabetes   Lab Results  Component Value Date   CREATININE 0.75 05/31/2019   BUN 16 05/31/2019   GFR 76.65 05/31/2019   GFRNONAA >60 12/01/2016   GFRAA >60 12/01/2016   NA 139 05/31/2019   K 4.8 05/31/2019   CALCIUM 9.6 05/31/2019   CO2 31 05/31/2019   Recent Relevant Labs: Lab Results  Component Value Date/Time   HGBA1C 8.8 (A) 07/20/2020 04:17 PM   HGBA1C 7.5 (A) 09/03/2019 09:04 AM   HGBA1C 8.2 (H) 05/31/2019 09:28 AM   HGBA1C 7.6 (H) 04/10/2018 08:13 AM    A1c goal < 7% Pt was on prednisone 08/01/20 - short course 9 days Works in [employer]ilities [employer] and hands are important for job, worried about bacteria exposure   Checking BG: cant remember last time she checked it  Would like to try a sample Dexcom, will pick it up in office on Monday.  Reports she does not need additional education, will read materials provided.  Patient has failed these meds in past: none  Patient is currently uncontrolled on the following medications:   Metformin 500 mg - 2 tablets in AM and 1 at night   Lab Results  Component Value Date/Time  HMDIABEYEEXA No Retinopathy 04/06/2020 12:00 AM    Last diabetic foot exam: 07/20/20 normal   We discussed: Metformin increased at last PCP visit from 2 tabs to 3 a day 07/20/20. Pt reports history of Yoder, has not noticed any worsening/changes with dose increase. Denies  any previous failed meds.  Diet: Reports diet has been terrible. More sweets than usual. Stress eating  Exercise: busy with grandchild   Plan: Trial Dexcom. CMA call in 2 weeks to review BG log. Consider increasing metformin to 2 tablets AM and 2 tablets PM at that time.   Depression   Patient has failed these meds in past: none  Patient is currently uncontrolled on the following medications:   Citalopram 20 mg - 1 tablet daily  Clonazepam 0.5 mg - 1/2 tablet PRN   We discussed: Taking clonazepam 1/2 tablet 1-2 days per week in the afternoons when anxiety peaks. Taking Celexa since 1993. Patient is overwhelmed with family and caregiver role. She would like a referral for counseling.  Plan: Continue current medications; Coordinate referral for counseling.  Medication Management   Patient's preferred pharmacy is:  [facility], [address], [address] [place], [address] [place] [address] Phone: [phone] Fax: [place], [address] [place] [address] Phone: [phone] Fax: [phone]  We discussed: Current pharmacy is preferred with insurance plan and patient is satisfied with pharmacy services  Plan  Continue current medication management strategy  Follow up: 3 month phone visit (CMA call in 2 weeks for BP and BG log review)  Debbora Dus, PharmD Clinical Pharmacist [facility] at [facility] [phone]

## 2020-09-25 NOTE — Telephone Encounter (Signed)
Patient would like to come in on Monday (09/28/20) next week to pick up a Dexcom sample. Provided basic instructions. She declines an educational visit at this time. Since I am out of the office, [place], could you assist by having a sample ready for her at the front desk?  Debbora Dus, PharmD Clinical Pharmacist [facility] at [facility] [phone]

## 2020-09-25 NOTE — Telephone Encounter (Signed)
The office will not be open on 1/17 due to weather. LVM for pt explaining office will not be open

## 2020-10-06 ENCOUNTER — Telehealth: Payer: Self-pay

## 2020-10-06 DIAGNOSIS — Z659 Problem related to unspecified psychosocial circumstances: Secondary | ICD-10-CM

## 2020-10-06 NOTE — Patient Instructions (Signed)
Dear Allison Yoder,  It was a pleasure meeting you during our initial appointment on September 25, 2020. Below is a summary of the goals we discussed and components of chronic care management. Please contact me anytime with questions or concerns.   Visit Information  Goals Addressed            This Visit's Progress   . Pharmacy Care Plan       CARE PLAN ENTRY (see longitudinal plan of care for additional care plan information)  Current Barriers:  . Chronic Disease Management support, education, and care coordination needs related to Hypertension and Diabetes   Hypertension BP Readings from Last 3 Encounters:  08/11/20 (!) 161/91  07/20/20 (!) 142/80  12/25/19 (!) 146/78   . Pharmacist Clinical Goal(s): o Over the next 3 months, patient will work with PharmD and providers to achieve BP goal <130/80 mmHg . Current regimen:   Lisinopril 10 mg - 1 daily  Metoprolol succinate 50 mg  - 1 daily . Interventions: o Recommend checking blood pressure at home with arm cuff this week for CMA to call and review  . Patient self care activities - Over the next 3 months, patient will: o Check BP several days this week, document, and provide at follow up call o Limit salt intake   Diabetes Lab Results  Component Value Date/Time   HGBA1C 8.8 (A) 07/20/2020 04:17 PM   HGBA1C 7.5 (A) 09/03/2019 09:04 AM   HGBA1C 8.2 (H) 05/31/2019 09:28 AM   HGBA1C 7.6 (H) 04/10/2018 08:13 AM   . Pharmacist Clinical Goal(s): o Over the next 3 months, patient will work with PharmD and providers to achieve A1c goal <7% . Current regimen:  o Metformin 500 mg - 2 tablets in AM and 1 at night  . Interventions: o Recommend checking home BG with Dexcom sample o Coordinate BG log review in 2 weeks o Send Dexcom prescription to [facility] to verify benefits  . Patient self care activities - Over the next 3 months, patient will: o Check blood sugar in the morning before eating or drinking, document, and provide at  future appointments o Contact provider with any episodes of hypoglycemia  Initial goal documentation       Allison Yoder was given information about Chronic Care Management services today including:  1. CCM service includes personalized support from designated clinical staff supervised by her physician, including individualized plan of care and coordination with other care providers 2. 24/7 contact phone numbers for assistance for urgent and routine care needs. 3. Standard insurance, coinsurance, copays and deductibles apply for chronic care management only during months in which we provide at least 20 minutes of these services. Most insurances cover these services at 100%, however patients may be responsible for any copay, coinsurance and/or deductible if applicable. This service may help you avoid the need for more expensive face-to-face services. 4. Only one practitioner may furnish and bill the service in a calendar month. 5. The patient may stop CCM services at any time (effective at the end of the month) by phone call to the office staff.  Patient agreed to services and verbal consent obtained.   The patient verbalized understanding of instructions, educational materials, and care plan provided today and declined offer to receive copy of patient instructions, educational materials, and care plan.  The pharmacy team will reach out to the patient again over the next 14 days.   Debbora Dus, PharmD Clinical Pharmacist [facility] at [facility]  [phone]    DASH Eating Plan DASH stands for Dietary Approaches to Stop Hypertension. The DASH eating plan is a healthy eating plan that has been shown to:  Reduce high blood pressure (hypertension).  Reduce your risk for type 2 diabetes, heart disease, and stroke.  Help with weight loss. What are tips for following this plan? Reading food labels  Check food labels for the amount of salt (sodium) per serving. Choose foods  with less than 5 percent of the Daily Value of sodium. Generally, foods with less than 300 milligrams (mg) of sodium per serving fit into this eating plan.  To find whole grains, look for the word "whole" as the first word in the ingredient list. Shopping  Buy products labeled as "low-sodium" or "no salt added."  Buy fresh foods. Avoid canned foods and pre-made or frozen meals. Cooking  Avoid adding salt when cooking. Use salt-free seasonings or herbs instead of table salt or sea salt. Check with your health care provider or pharmacist before using salt substitutes.  Do not fry foods. Cook foods using healthy methods such as baking, boiling, grilling, roasting, and broiling instead.  Cook with heart-healthy oils, such as olive, canola, avocado, soybean, or sunflower oil. Meal planning  Eat a balanced diet that includes: ? 4 or more servings of fruits and 4 or more servings of vegetables each day. Try to fill one-half of your plate with fruits and vegetables. ? 6-8 servings of whole grains each day. ? Less than 6 oz (170 g) of lean meat, poultry, or fish each day. A 3-oz (85-g) serving of meat is about the same size as a deck of cards. One egg equals 1 oz (28 g). ? 2-3 servings of low-fat dairy each day. One serving is 1 cup (237 mL). ? 1 serving of nuts, seeds, or beans 5 times each week. ? 2-3 servings of heart-healthy fats. Healthy fats called omega-3 fatty acids are found in foods such as walnuts, flaxseeds, fortified milks, and eggs. These fats are also found in cold-water fish, such as sardines, salmon, and mackerel.  Limit how much you eat of: ? Canned or prepackaged foods. ? Food that is high in trans fat, such as some fried foods. ? Food that is high in saturated fat, such as fatty meat. ? Desserts and other sweets, sugary drinks, and other foods with added sugar. ? Full-fat dairy products.  Do not salt foods before eating.  Do not eat more than 4 egg yolks a week.  Try to  eat at least 2 vegetarian meals a week.  Eat more home-cooked food and less restaurant, buffet, and fast food.   Lifestyle  When eating at a restaurant, ask that your food be prepared with less salt or no salt, if possible.  If you drink alcohol: ? Limit how much you use to:  0-1 drink a day for women who are not pregnant.  0-2 drinks a day for men. ? Be aware of how much alcohol is in your drink. In the [place] one drink equals one 12 oz bottle of beer (355 mL), one 5 oz glass of wine (148 mL), or one 1 oz glass of hard liquor (44 mL). General information  Avoid eating more than 2,300 mg of salt a day. If you have hypertension, you may need to reduce your sodium intake to 1,500 mg a day.  Work with your health care provider to maintain a healthy body weight or to lose weight. Ask what an ideal  weight is for you.  Get at least 30 minutes of exercise that causes your heart to beat faster (aerobic exercise) most days of the week. Activities may include walking, swimming, or biking.  Work with your health care provider or dietitian to adjust your eating plan to your individual calorie needs. What foods should I eat? Fruits All fresh, dried, or frozen fruit. Canned fruit in natural juice (without added sugar). Vegetables Fresh or frozen vegetables (raw, steamed, roasted, or grilled). Low-sodium or reduced-sodium tomato and vegetable juice. Low-sodium or reduced-sodium tomato sauce and tomato paste. Low-sodium or reduced-sodium canned vegetables. Grains Whole-grain or whole-wheat bread. Whole-grain or whole-wheat pasta. Brown rice. Orpah Cobb. Bulgur. Whole-grain and low-sodium cereals. Pita bread. Low-fat, low-sodium crackers. Whole-wheat flour tortillas. Meats and other proteins Skinless chicken or [place]. Ground chicken or [place]. Pork with fat trimmed off. Fish and seafood. Egg whites. Dried beans, peas, or lentils. Unsalted nuts, nut butters, and seeds. Unsalted canned beans. Lean  cuts of beef with fat trimmed off. Low-sodium, lean precooked or cured meat, such as sausages or meat loaves. Dairy Low-fat (1%) or fat-free (skim) milk. Reduced-fat, low-fat, or fat-free cheeses. Nonfat, low-sodium ricotta or cottage cheese. Low-fat or nonfat yogurt. Low-fat, low-sodium cheese. Fats and oils Soft margarine without trans fats. Vegetable oil. Reduced-fat, low-fat, or light mayonnaise and salad dressings (reduced-sodium). Canola, safflower, olive, avocado, soybean, and sunflower oils. Avocado. Seasonings and condiments Herbs. Spices. Seasoning mixes without salt. Other foods Unsalted popcorn and pretzels. Fat-free sweets. The items listed above may not be a complete list of foods and beverages you can eat. Contact a dietitian for more information. What foods should I avoid? Fruits Canned fruit in a light or heavy syrup. Fried fruit. Fruit in cream or butter sauce. Vegetables Creamed or fried vegetables. Vegetables in a cheese sauce. Regular canned vegetables (not low-sodium or reduced-sodium). Regular canned tomato sauce and paste (not low-sodium or reduced-sodium). Regular tomato and vegetable juice (not low-sodium or reduced-sodium). Rosita Fire. Olives. Grains Baked goods made with fat, such as croissants, muffins, or some breads. Dry pasta or rice meal packs. Meats and other proteins Fatty cuts of meat. Ribs. Fried meat. Tomasa Blase. Bologna, salami, and other precooked or cured meats, such as sausages or meat loaves. Fat from the back of a pig (fatback). Bratwurst. Salted nuts and seeds. Canned beans with added salt. Canned or smoked fish. Whole eggs or egg yolks. Chicken or [place] with skin. Dairy Whole or 2% milk, cream, and half-and-half. Whole or full-fat cream cheese. Whole-fat or sweetened yogurt. Full-fat cheese. Nondairy creamers. Whipped toppings. Processed cheese and cheese spreads. Fats and oils Butter. Stick margarine. Lard. Shortening. Ghee. Bacon fat. Tropical oils, such  as coconut, palm kernel, or palm oil. Seasonings and condiments Onion salt, garlic salt, seasoned salt, table salt, and sea salt. Worcestershire sauce. Tartar sauce. Barbecue sauce. Teriyaki sauce. Soy sauce, including reduced-sodium. Steak sauce. Canned and packaged gravies. Fish sauce. Oyster sauce. Cocktail sauce. Store-bought horseradish. Ketchup. Mustard. Meat flavorings and tenderizers. Bouillon cubes. Hot sauces. Pre-made or packaged marinades. Pre-made or packaged taco seasonings. Relishes. Regular salad dressings. Other foods Salted popcorn and pretzels. The items listed above may not be a complete list of foods and beverages you should avoid. Contact a dietitian for more information. Where to find more information  National Heart, Lung, and Blood Institute: PopSteam.is  American Heart Association: www.heart.org  Academy of Nutrition and Dietetics: www.eatright.org  National Kidney Foundation: www.kidney.org Summary  The DASH eating plan is a healthy eating plan that has  been shown to reduce high blood pressure (hypertension). It may also reduce your risk for type 2 diabetes, heart disease, and stroke.  When on the DASH eating plan, aim to eat more fresh fruits and vegetables, whole grains, lean proteins, low-fat dairy, and heart-healthy fats.  With the DASH eating plan, you should limit salt (sodium) intake to 2,300 mg a day. If you have hypertension, you may need to reduce your sodium intake to 1,500 mg a day.  Work with your health care provider or dietitian to adjust your eating plan to your individual calorie needs. This information is not intended to replace advice given to you by your health care provider. Make sure you discuss any questions you have with your health care provider. Document Revised: 08/02/2019 Document Reviewed: 08/02/2019 Elsevier Patient Education  2021 [employer].

## 2020-10-06 NOTE — Telephone Encounter (Signed)
PCP Consult CCM Visit 09/24/20:  Please send Dexcom G6 receiver, transmitter, and sensors to [facility] [place]) for coordination of benefits. [facility] will coordinate fulfillment at pharmacy of patient's choice and check insurance coverage.   Patient would also like a referral for counseling due to family stressors.  Debbora Dus, PharmD Clinical Pharmacist [facility] at [facility] [phone]

## 2020-10-07 ENCOUNTER — Telehealth: Payer: Self-pay

## 2020-10-07 MED ORDER — DEXCOM G6 TRANSMITTER MISC: 0 refills | Status: DC

## 2020-10-07 MED ORDER — DEXCOM G6 SENSOR MISC: 1 refills | Status: DC

## 2020-10-07 MED ORDER — DEXCOM G6 RECEIVER DEVI: 0 refills | Status: DC

## 2020-10-07 NOTE — Telephone Encounter (Signed)
All orders placed.  Thanks.

## 2020-10-07 NOTE — Chronic Care Management (AMB) (Addendum)
° ° °Chronic Care Management °Pharmacy Assistant  ° °Name: Lee-Anne V Ripp  MRN: [id number] DOB: 05/08/1951 ° °Reason for Encounter: Disease State- Hypertension / Diabetes ° °Patient Questions: ° 1.  Have you seen any other providers since your last visit? No ° 2.  Any changes in your medicines or health? No ° ° °PCP : Duncan, Graham S, MD ° °Allergies:   °Allergies  °Allergen Reactions  ° Cephalexin Itching  °  Does tolerate augmentin  ° Codeine Itching and Rash  ° Inapsine [Droperidol] Shortness Of Breath, Anxiety and Hypertension  °  Elevated HR and BP, panic attack  ° Crestor [Rosuvastatin]   °  Aches  ° Lipitor [Atorvastatin Calcium]   °  Myalgias °  ° Niacin And Related   °  Flushing  ° Pravastatin   °  Myalgias  ° Adhesive [Tape] Rash  °  Blisters with tape  ° ° °Medications: °Outpatient Encounter Medications as of 10/07/2020  °Medication Sig  ° albuterol (VENTOLIN HFA) 108 (90 Base) MCG/ACT inhaler INHALE 1 TO 2 PUFFS INTO THE LUNGS EVERY 6 HOURS AS NEEDED FOR WHEEZING OR SHORTNESS OF BREATH  ° Alirocumab (PRALUENT) 150 MG/ML SOAJ Inject 1 pen into the skin every 14 (fourteen) days.  ° benzonatate (TESSALON) 200 MG capsule Take 1 capsule (200 mg total) by mouth 3 (three) times daily as needed for cough. Swallow whole, do not bite pill  ° Blood Glucose Monitoring Suppl (ONE TOUCH ULTRA 2) w/Device KIT Check blood sugar once daily and as instructed. Dx E11.9  ° celecoxib (CELEBREX) 100 MG capsule Take 1 capsule (100 mg total) by mouth daily.  ° citalopram (CELEXA) 20 MG tablet TAKE 1 TABLET BY MOUTH  DAILY  ° clonazePAM (KLONOPIN) 0.5 MG tablet Take 1 tablet (0.5 mg total) by mouth daily as needed for anxiety.  ° cyanocobalamin 1000 MCG tablet Take 1,000 mcg by mouth daily.  ° cyclobenzaprine (FLEXERIL) 10 MG tablet Take 1 tablet (10 mg total) by mouth daily as needed for muscle spasms.  ° ezetimibe (ZETIA) 10 MG tablet TAKE 1 TABLET BY MOUTH  DAILY  ° fluticasone (FLONASE) 50 MCG/ACT nasal spray Place into both  nostrils daily as needed for allergies or rhinitis.  ° glucose blood (ONE TOUCH ULTRA TEST) test strip Check blood sugar once daily and as instructed. Dx E11.9  ° levocetirizine (XYZAL) 5 MG tablet Take 5 mg by mouth every evening.  ° lisinopril (ZESTRIL) 10 MG tablet TAKE 1 TABLET BY MOUTH AT  BEDTIME  ° metFORMIN (GLUCOPHAGE) 500 MG tablet TAKE 1-2 TABLETS BY MOUTH TWICE DAILY WITH A MEAL  ° metoprolol succinate (TOPROL-XL) 50 MG 24 hr tablet TAKE 1 TABLET BY MOUTH  DAILY WITH OR IMMEDIATELY  FOLLOWING A MEAL  ° Multiple Vitamin (MULTIVITAMIN) tablet Take 1 tablet by mouth daily.  ° NITROFURANTOIN PO Take 100 mg by mouth daily as needed.   ° ONETOUCH DELICA LANCETS 33G MISC Check blood sugar once daily and as instructed. Dx E11.9  ° Vitamin D, Cholecalciferol, 1000 units CAPS Take 3,000 Units by mouth daily.  ° °No facility-administered encounter medications on file as of 10/07/2020.  ° ° °Current Diagnosis: °Patient Active Problem List  ° Diagnosis Date Noted  ° Dysphagia 07/22/2020  ° Healthcare maintenance 07/22/2019  ° Cough 07/22/2019  ° Arthralgia 07/22/2019  ° Medicare annual wellness visit, initial 06/10/2018  ° Other chest pain 06/10/2018  ° Acute bronchitis 09/16/2017  ° Osteopenia 09/10/2017  ° Fatty liver 05/19/2017  °   Diabetes mellitus (HCC) 05/05/2017  ° Cerebrovascular accident (CVA) due to thrombosis of left posterior cerebral artery (HCC) 04/08/2016  ° Hypertensive disorder 10/27/2015  ° Advance care planning 04/29/2015  ° Obstructive sleep apnea of adult 07/25/2014  ° Palpitations 01/06/2013  ° Anxiety 09/01/2012  ° MDD (major depressive disorder) 12/19/2011  ° OA (osteoarthritis) 12/19/2011  ° Hyperlipidemia 12/19/2011  ° Diverticulitis 12/19/2011  ° Migraines 12/19/2011  ° Hypothyroid 12/19/2011  ° Iritis 12/19/2011  ° °Contacted patient to follow up on her blood glucose and blood pressure readings. A Dexcom sample was left at the front desk of Dr. Duncan's office 09/28/20. Patient states she is  still sick and has forgotten to go by and pick this up. She states that she pick it up today or tomorrow. Inquired about blood pressure and patient notes that she is using a monitor from [facility] that goes around her upper arm. She reports 2 readings 143/81 and 150/87. She states that she has doubled up her lisinopril at dinner time so she is now taking 2 tablets. She denies any side effects with taking lisinopril this way. Patient is aware I will call her 10/22/20 to review blood glucose and blood pressure readings.  ° °Follow-Up:  Pharmacist Review  ° °Michelle Adams, CPP notified ° °Lindsay Saintsing, CMA °Clinical Pharmacy Assistant °[phone] ° °Sent PCP consult for approval of increased lisinopril dosing. I have reviewed the care management and care coordination activities outlined in this encounter and I am certifying that I agree with the content of this note. No further action required. ° °Michelle Adams, PharmD °Clinical Pharmacist °[facility] [place] °[phone] ° ° °

## 2020-10-08 ENCOUNTER — Telehealth: Payer: Self-pay

## 2020-10-08 NOTE — Telephone Encounter (Signed)
Patient advised. Letter placed up front.  

## 2020-10-08 NOTE — Telephone Encounter (Signed)
Letter done.  Thanks.  If she has progressive sx, then please update [place] as the clinic.

## 2020-10-08 NOTE — Telephone Encounter (Signed)
Patient planned to come today or tomorrow but is sick. She will come sometime in next couple of weeks.  Thanks,  Debbora Dus, PharmD Clinical Pharmacist [facility] at [facility] [phone]

## 2020-10-08 NOTE — Telephone Encounter (Signed)
Pt lvm stating she tested positive yesterday for COVID.  Says she has a mandatory court date tomorrow.  Her attorney is requesting doctor's note stating her situation and she will have someone come pick it up.  Plz advise at [phone].

## 2020-10-11 ENCOUNTER — Telehealth: Payer: Self-pay

## 2020-10-11 DIAGNOSIS — I10 Essential (primary) hypertension: Secondary | ICD-10-CM

## 2020-10-11 MED ORDER — LISINOPRIL 10 MG PO TABS: 20.0000 mg | ORAL_TABLET | Freq: Every day | ORAL | Status: DC

## 2020-10-11 NOTE — Telephone Encounter (Signed)
Patient is concerned about high BP at the last few visits. Due to her history of stroke, DM, and elevated calcium score, would like to target BP goal of < 130/80 if tolerated.   BP Readings from Last 3 Encounters:  08/11/20 (!) 161/91  07/20/20 (!) 142/80  12/25/19 (!) 146/78   Current meds - lisinopril 10 mg 1 daily, metoprolol succinate 50 mg 1 daily Home BP - 143/81 and 150/87 with arm cuff   We discussed trial of increasing lisinopril to 20 mg daily for next 2 weeks pending PCP approval. CMA will call in 2 weeks for BP log. Repeat labs in 2 weeks.   BMP Latest Ref Rng & Units 05/31/2019 04/10/2018 08/15/2017  Glucose 70 - 99 mg/dL 194(H) 198(H) 190(H)  BUN 6 - 23 mg/dL 16 14 16   Creatinine 0.40 - 1.20 mg/dL 0.75 0.76 0.75  Sodium 135 - 145 mEq/L 139 140 137  Potassium 3.5 - 5.1 mEq/L 4.8 4.4 4.5  Chloride 96 - 112 mEq/L 102 104 102  CO2 19 - 32 mEq/L 31 30 29   Calcium 8.4 - 10.5 mg/dL 9.6 9.6 9.8   Debbora Dus, PharmD Clinical Pharmacist [facility] at [facility] [phone]

## 2020-10-11 NOTE — Telephone Encounter (Signed)
Agreed.  Thanks.  I updated her med list.  Please let me know she needs a new prescription in the meantime.  Please schedule a follow-up lab visit.  I put in the follow-up lab order.

## 2020-10-12 ENCOUNTER — Encounter: Payer: Self-pay | Admitting: Family Medicine

## 2020-10-12 NOTE — Telephone Encounter (Signed)
Allison Yoder, could you let Rogue know what she needs to do now that the psychology referral has been placed.  Thank you,  Debbora Dus, PharmD Clinical Pharmacist [facility] at [facility] [phone]

## 2020-10-12 NOTE — Telephone Encounter (Signed)
Spoke with patient to schedule lab visit. She is just getting over COVID and wasn't feeling the best today to make an appt. Asked if we could call back Friday or next week for her to make her lab appt. I advised patient I will call her back at a better time or she could call us back when shes ready to schedule lab appt; patient didn't thing she would remember to call back. Will call patient back later this week or Monday.

## 2020-10-13 NOTE — Telephone Encounter (Signed)
Patient aware referral was placed and to expect a call from them to schedule.

## 2020-10-13 NOTE — Telephone Encounter (Signed)
Patient scheduled lab appts for tomorrow at 1pm

## 2020-10-14 ENCOUNTER — Other Ambulatory Visit: Payer: Medicare Other

## 2020-10-14 ENCOUNTER — Other Ambulatory Visit: Payer: Self-pay

## 2020-10-14 NOTE — Telephone Encounter (Signed)
Patient came into office and in the process of checking in stated she tested positive last week. Pt was here too since, as it was not yet 10 days since office protocol, and was asked to reschedule. Pt got upset she was not told this and was told to come in for labs. Patient asked to reschedule and said "have them call me when they are ready to see me." then walked out of clinic. Please advise.

## 2020-10-14 NOTE — Telephone Encounter (Signed)
I sent her a my chart message.  I will try to get extra information from her.  I do not know if she was screened with typical COVID questions when that lab appointment was scheduled so I will ask her about that.

## 2020-10-19 ENCOUNTER — Other Ambulatory Visit: Payer: Self-pay | Admitting: Family Medicine

## 2020-10-19 DIAGNOSIS — I10 Essential (primary) hypertension: Secondary | ICD-10-CM

## 2020-10-19 MED ORDER — LISINOPRIL 10 MG PO TABS: 30.0000 mg | ORAL_TABLET | Freq: Every day | ORAL | Status: DC

## 2020-10-22 ENCOUNTER — Telehealth: Payer: Self-pay

## 2020-10-22 NOTE — Telephone Encounter (Signed)
Ria Comment,  At last CCM visit 09/25/20, pt was taking 2 metformin in the morning and 1 in the evening. If she is still only taking 3 a day, I would like her to go ahead and increase to 2 tablets twice daily. Her prescription is for metformin 500 mg - take 1-2 tablets twice daily.  Debbora Dus, PharmD Clinical Pharmacist [facility] at [facility] [phone]

## 2020-10-22 NOTE — Chronic Care Management (AMB) (Addendum)
Chronic Care Management Pharmacy Assistant   Name: Allison Yoder  MRN: [id number] DOB: 1951/05/13  Reason for Encounter: Disease State- Blood glucose and blood pressure log update  PCP : Tonia Ghent, MD  Allergies:   Allergies  Allergen Reactions   Cephalexin Itching    Does tolerate augmentin   Codeine Itching and Rash   Inapsine [Droperidol] Shortness Of Breath, Anxiety and Hypertension    Elevated HR and BP, panic attack   Crestor [Rosuvastatin]     Aches   Lipitor [Atorvastatin Calcium]     Myalgias    Niacin And Related     Flushing   Pravastatin     Myalgias   Adhesive [Tape] Rash    Blisters with tape    Medications: Outpatient Encounter Medications as of 10/22/2020  Medication Sig   albuterol (VENTOLIN HFA) 108 (90 Base) MCG/ACT inhaler INHALE 1 TO 2 PUFFS INTO THE LUNGS EVERY 6 HOURS AS NEEDED FOR WHEEZING OR SHORTNESS OF BREATH   Alirocumab (PRALUENT) 150 MG/ML SOAJ Inject 1 pen into the skin every 14 (fourteen) days.   benzonatate (TESSALON) 200 MG capsule Take 1 capsule (200 mg total) by mouth 3 (three) times daily as needed for cough. Swallow whole, do not bite pill   Blood Glucose Monitoring Suppl (ONE TOUCH ULTRA 2) w/Device KIT Check blood sugar once daily and as instructed. Dx E11.9   celecoxib (CELEBREX) 100 MG capsule Take 1 capsule (100 mg total) by mouth daily.   citalopram (CELEXA) 20 MG tablet TAKE 1 TABLET BY MOUTH  DAILY   clonazePAM (KLONOPIN) 0.5 MG tablet Take 1 tablet (0.5 mg total) by mouth daily as needed for anxiety.   Continuous Blood Gluc Receiver (DEXCOM G6 RECEIVER) DEVI Use as directed. Dx E11.9.   Continuous Blood Gluc Sensor (DEXCOM G6 SENSOR) MISC Use as directed. Dx E11.9.   Continuous Blood Gluc Transmit (DEXCOM G6 TRANSMITTER) MISC Use as directed. Dx E11.9.   cyanocobalamin 1000 MCG tablet Take 1,000 mcg by mouth daily.   cyclobenzaprine (FLEXERIL) 10 MG tablet Take 1 tablet (10 mg total) by mouth daily as needed for  muscle spasms.   ezetimibe (ZETIA) 10 MG tablet TAKE 1 TABLET BY MOUTH  DAILY   fluticasone (FLONASE) 50 MCG/ACT nasal spray Place into both nostrils daily as needed for allergies or rhinitis.   glucose blood (ONE TOUCH ULTRA TEST) test strip Check blood sugar once daily and as instructed. Dx E11.9   levocetirizine (XYZAL) 5 MG tablet Take 5 mg by mouth every evening.   lisinopril (ZESTRIL) 10 MG tablet Take 3 tablets (30 mg total) by mouth at bedtime.   metFORMIN (GLUCOPHAGE) 500 MG tablet TAKE 1-2 TABLETS BY MOUTH TWICE DAILY WITH A MEAL   metoprolol succinate (TOPROL-XL) 50 MG 24 hr tablet TAKE 1 TABLET BY MOUTH  DAILY WITH OR IMMEDIATELY  FOLLOWING A MEAL   Multiple Vitamin (MULTIVITAMIN) tablet Take 1 tablet by mouth daily.   NITROFURANTOIN PO Take 100 mg by mouth daily as needed.    ONETOUCH DELICA LANCETS 81J MISC Check blood sugar once daily and as instructed. Dx E11.9   Vitamin D, Cholecalciferol, 1000 units CAPS Take 3,000 Units by mouth daily.   No facility-administered encounter medications on file as of 10/22/2020.    Current Diagnosis: Patient Active Problem List   Diagnosis Date Noted   Dysphagia 07/22/2020   Healthcare maintenance 07/22/2019   Cough 07/22/2019   Arthralgia 07/22/2019   Medicare annual wellness visit, initial 06/10/2018  Other chest pain 06/10/2018   Acute bronchitis 09/16/2017   Osteopenia 09/10/2017   Fatty liver 05/19/2017   Diabetes mellitus ([place]) 05/05/2017   Cerebrovascular accident (CVA) due to thrombosis of left posterior cerebral artery ([place]) 04/08/2016   Hypertensive disorder 10/27/2015   Advance care planning 04/29/2015   Obstructive sleep apnea of adult 07/25/2014   Palpitations 01/06/2013   Anxiety 09/01/2012   MDD (major depressive disorder) 12/19/2011   OA (osteoarthritis) 12/19/2011   Hyperlipidemia 12/19/2011   Diverticulitis 12/19/2011   Migraines 12/19/2011   Hypothyroid 12/19/2011   Iritis 12/19/2011    Recent Relevant  Labs: Lab Results  Component Value Date/Time   HGBA1C 8.8 (A) 07/20/2020 04:17 PM   HGBA1C 7.5 (A) 09/03/2019 09:04 AM   HGBA1C 8.2 (H) 05/31/2019 09:28 AM   HGBA1C 7.6 (H) 04/10/2018 08:13 AM    Kidney Function Lab Results  Component Value Date/Time   CREATININE 0.75 05/31/2019 09:28 AM   CREATININE 0.76 04/10/2018 08:13 AM   CREATININE 0.73 11/13/2015 03:57 PM   GFR 76.65 05/31/2019 09:28 AM   GFRNONAA >60 12/01/2016 10:40 AM   GFRAA >60 12/01/2016 10:40 AM   Reviewed chart prior to disease state call. Spoke with patient regarding BG  Current antihyperglycemic regimen:  Metformin 500 mg- 2 tablets in AM and 2 tablet at night  How often are you checking your blood sugar? 3-4 times daily  What are your blood sugars ranging?   Fasting: 140-160 Before meals:150-80 After meals: 190-210 Bedtime: N/A  On insulin? No  Patient was at work during call so was unable to provide specific dates for the readings. States she really enjoys the [facility] sample because she can see that her blood sugar spikes in the morning even before meals.   Adherence Review: Is the patient currently on a STATIN medication? No Is the patient currently on ACE/ARB medication? Yes Does the patient have >5 day gap between last estimated fill dates? CPP to review   Reviewed chart prior to disease state call. Spoke with patient regarding BP   Current antihypertensive regimen:  Lisinopril 10 mg - Take 3 tablets at bedtime. (still taking 2 has not started 3rd tablet yet) Metoprolol succinate 50 mg  - 1 daily  How often are you checking your Blood Pressure? 1-2x per week  What time of day is the patient checking blood pressure? Before or after taking medication? Checks in the morning after medication.   Current home BP readings: Patient going off of memory. States BP monitor does not have a memory and she is at work today so she does not have a log.  DATE:             BP                PULSE 10/19/20  142/80  N/A 10/18/20  128/76  N/A  - states she was resting all day and not moving around much.  10/16/20  160/91  N/A  Wrist or arm cuff: Arm Caffeine intake: 1 cup of coffee in the morning, occasionally 1 glass of tea in the afternoon Salt intake: Recently started to monitor and limit salt intake.  OTC medications including pseudoephedrine or NSAIDs?: No longer taking OTC cold medication.  Adherence Review: Is the patient currently on ACE/ARB medication? Yes Does the patient have >5 day gap between last estimated fill dates? CPP to review  Patient states she was instructed by Dr. Damita Dunnings to start 30 mg lisinopril but she has not done so  yet. She plans to start tonight. States her blood pressure is still running high despite stopping OTC cold medications and limiting salt. She does feel like she was using quite a bit of salt prior to monitoring it. Will follow up in 2 weeks to review BP log since starting 30 mg lisinopril.   Follow-Up:  Pharmacist Review  Debbora Dus, CPP notified  Margaretmary Dys, [place] Assistant [phone]  I have reviewed the care management and care coordination activities outlined in this encounter and I am certifying that I agree with the content of this note. Would like to verify metformin dose and increase to 2000 mg daily if not currently taking.   Debbora Dus, PharmD Clinical Pharmacist [facility] at [facility] [phone]

## 2020-10-23 NOTE — Chronic Care Management (AMB) (Addendum)
Contacted patient to verify she was taking metformin 2 in the morning and 2 in the afternoon. She states she started taking 4 a day about 1 week ago. Notes that Dr. Damita Dunnings told her to add the additional tablet if her sugars were not coming down, so she did so.   Follow-Up:  Pharmacist Review  Debbora Dus, CPP notified  Margaretmary Dys, [place] Assistant [phone]  Noted, will hold off on changes for now then. Review BG log in 2 weeks when calling for BP.  Debbora Dus, PharmD Clinical Pharmacist [facility] at [facility] [phone]

## 2020-10-26 ENCOUNTER — Telehealth: Payer: Self-pay

## 2020-10-26 NOTE — Chronic Care Management (AMB) (Addendum)
Chronic Care Management Pharmacy Assistant   Name: JANENE YOUSUF  MRN: [id number] DOB: Dec 02, 1950  Reason for Encounter: Dexcom Status  PCP : Tonia Ghent, MD  Allergies:   Allergies  Allergen Reactions   Cephalexin Itching    Does tolerate augmentin   Codeine Itching and Rash   Inapsine [Droperidol] Shortness Of Breath, Anxiety and Hypertension    Elevated HR and BP, panic attack   Crestor [Rosuvastatin]     Aches   Lipitor [Atorvastatin Calcium]     Myalgias    Niacin And Related     Flushing   Pravastatin     Myalgias   Adhesive [Tape] Rash    Blisters with tape    Medications: Outpatient Encounter Medications as of 10/26/2020  Medication Sig   albuterol (VENTOLIN HFA) 108 (90 Base) MCG/ACT inhaler INHALE 1 TO 2 PUFFS INTO THE LUNGS EVERY 6 HOURS AS NEEDED FOR WHEEZING OR SHORTNESS OF BREATH   Alirocumab (PRALUENT) 150 MG/ML SOAJ Inject 1 pen into the skin every 14 (fourteen) days.   benzonatate (TESSALON) 200 MG capsule Take 1 capsule (200 mg total) by mouth 3 (three) times daily as needed for cough. Swallow whole, do not bite pill   Blood Glucose Monitoring Suppl (ONE TOUCH ULTRA 2) w/Device KIT Check blood sugar once daily and as instructed. Dx E11.9   celecoxib (CELEBREX) 100 MG capsule Take 1 capsule (100 mg total) by mouth daily.   citalopram (CELEXA) 20 MG tablet TAKE 1 TABLET BY MOUTH  DAILY   clonazePAM (KLONOPIN) 0.5 MG tablet Take 1 tablet (0.5 mg total) by mouth daily as needed for anxiety.   Continuous Blood Gluc Receiver (DEXCOM G6 RECEIVER) DEVI Use as directed. Dx E11.9.   Continuous Blood Gluc Sensor (DEXCOM G6 SENSOR) MISC Use as directed. Dx E11.9.   Continuous Blood Gluc Transmit (DEXCOM G6 TRANSMITTER) MISC Use as directed. Dx E11.9.   cyanocobalamin 1000 MCG tablet Take 1,000 mcg by mouth daily.   cyclobenzaprine (FLEXERIL) 10 MG tablet Take 1 tablet (10 mg total) by mouth daily as needed for muscle spasms.   ezetimibe (ZETIA) 10 MG  tablet TAKE 1 TABLET BY MOUTH  DAILY   fluticasone (FLONASE) 50 MCG/ACT nasal spray Place into both nostrils daily as needed for allergies or rhinitis.   glucose blood (ONE TOUCH ULTRA TEST) test strip Check blood sugar once daily and as instructed. Dx E11.9   levocetirizine (XYZAL) 5 MG tablet Take 5 mg by mouth every evening.   lisinopril (ZESTRIL) 10 MG tablet Take 3 tablets (30 mg total) by mouth at bedtime.   metFORMIN (GLUCOPHAGE) 500 MG tablet TAKE 1-2 TABLETS BY MOUTH TWICE DAILY WITH A MEAL   metoprolol succinate (TOPROL-XL) 50 MG 24 hr tablet TAKE 1 TABLET BY MOUTH  DAILY WITH OR IMMEDIATELY  FOLLOWING A MEAL   Multiple Vitamin (MULTIVITAMIN) tablet Take 1 tablet by mouth daily.   NITROFURANTOIN PO Take 100 mg by mouth daily as needed.    ONETOUCH DELICA LANCETS 25E MISC Check blood sugar once daily and as instructed. Dx E11.9   Vitamin D, Cholecalciferol, 1000 units CAPS Take 3,000 Units by mouth daily.   No facility-administered encounter medications on file as of 10/26/2020.    Current Diagnosis: Patient Active Problem List   Diagnosis Date Noted   Dysphagia 07/22/2020   Healthcare maintenance 07/22/2019   Cough 07/22/2019   Arthralgia 07/22/2019   Medicare annual wellness visit, initial 06/10/2018   Other chest pain 06/10/2018  Acute bronchitis 09/16/2017  ° Osteopenia 09/10/2017  ° Fatty liver 05/19/2017  ° Diabetes mellitus (HCC) 05/05/2017  ° Cerebrovascular accident (CVA) due to thrombosis of left posterior cerebral artery (HCC) 04/08/2016  ° Hypertensive disorder 10/27/2015  ° Advance care planning 04/29/2015  ° Obstructive sleep apnea of adult 07/25/2014  ° Palpitations 01/06/2013  ° Anxiety 09/01/2012  ° MDD (major depressive disorder) 12/19/2011  ° OA (osteoarthritis) 12/19/2011  ° Hyperlipidemia 12/19/2011  ° Diverticulitis 12/19/2011  ° Migraines 12/19/2011  ° Hypothyroid 12/19/2011  ° Iritis 12/19/2011  ° °Contacted [facility] pharmacy to get update on status of Dexcom.  [facility] states order was sent to [facility] for fulfillment, however patient cancelled order. Contacted [facility] to find out reasoning. Mary at [facility] stated that patient was contacted and declined services because she was not interested in changing health care. I attempted to contact patient but had to leave a voicemail stating that [facility] is who would be delivering her Dexcom and to listen out for a call from them today. There is not change in healthcare as they are a supplier. ° ° °Follow-Up:  Pharmacist Review ° °Michelle Adams, CPP notified ° °Lindsay Saintsing, CMA °Clinical Pharmacy Assistant °[phone] ° °I have reviewed the care management and care coordination activities outlined in this encounter and I am certifying that I agree with the content of this note. No further action required. ° °Michelle Adams, PharmD °Clinical Pharmacist °[facility] [place] °[phone] ° °

## 2020-10-27 NOTE — Chronic Care Management (AMB) (Addendum)
Allison Yoder returned call. States she did receive some paperwork from [employer] and mailed back to them to today. I explained she would be getting a call from them today as well. Patient also wanted to mention that her blood pressure was 123/78 this morning.    Follow-Up:  Pharmacist Review  Debbora Dus, CPP notified  Margaretmary Dys, [place] Assistant [phone]  I have reviewed the care management and care coordination activities outlined in this encounter and I am certifying that I agree with the content of this note. No further action required.  Debbora Dus, PharmD Clinical Pharmacist [facility] at [facility] [phone]

## 2020-11-06 NOTE — Chronic Care Management (AMB) (Addendum)
Contacted patient to follow up on Dexcom status. Call went to voicemail. Left message asking patient to return call.  Follow-Up:  Pharmacist Review  Debbora Dus, CPP notified  Margaretmary Dys, [place] Pharmacy Assistant [phone]

## 2020-11-06 NOTE — Chronic Care Management (AMB) (Addendum)
Patient returned call. States she spoke with representative for Dexcom and provided more information. She states she had a hard time understanding the representative and in the middle of the conversation the representative hung up. Patient did not know how to call back. Explained to patient I will contact company and get back with her.   Follow-Up:  Pharmacist Review  Debbora Dus, CPP notified  Margaretmary Dys, [place] Pharmacy Assistant [phone]

## 2020-11-19 ENCOUNTER — Telehealth: Payer: Self-pay

## 2020-11-19 NOTE — Chronic Care Management (AMB) (Signed)
    Chronic Care Management Pharmacy Assistant   Name: Allison Yoder  MRN: [id number] DOB: 1951-08-12   Reason for Encounter: Dexcom Follow up    Conditions to be addressed/monitored: DMII    Medications: Outpatient Encounter Medications as of 11/19/2020  Medication Sig  . albuterol (VENTOLIN HFA) 108 (90 Base) MCG/ACT inhaler INHALE 1 TO 2 PUFFS INTO THE LUNGS EVERY 6 HOURS AS NEEDED FOR WHEEZING OR SHORTNESS OF BREATH  . Alirocumab (PRALUENT) 150 MG/ML SOAJ Inject 1 pen into the skin every 14 (fourteen) days.  . benzonatate (TESSALON) 200 MG capsule Take 1 capsule (200 mg total) by mouth 3 (three) times daily as needed for cough. Swallow whole, do not bite pill  . Blood Glucose Monitoring Suppl (ONE TOUCH ULTRA 2) w/Device KIT Check blood sugar once daily and as instructed. Dx E11.9  . celecoxib (CELEBREX) 100 MG capsule Take 1 capsule (100 mg total) by mouth daily.  . citalopram (CELEXA) 20 MG tablet TAKE 1 TABLET BY MOUTH  DAILY  . clonazePAM (KLONOPIN) 0.5 MG tablet Take 1 tablet (0.5 mg total) by mouth daily as needed for anxiety.  . Continuous Blood Gluc Receiver (DEXCOM G6 RECEIVER) DEVI Use as directed. Dx E11.9.  . Continuous Blood Gluc Sensor (DEXCOM G6 SENSOR) MISC Use as directed. Dx E11.9.  . Continuous Blood Gluc Transmit (DEXCOM G6 TRANSMITTER) MISC Use as directed. Dx E11.9.  Marland Kitchen cyanocobalamin 1000 MCG tablet Take 1,000 mcg by mouth daily.  . cyclobenzaprine (FLEXERIL) 10 MG tablet Take 1 tablet (10 mg total) by mouth daily as needed for muscle spasms.  Marland Kitchen ezetimibe (ZETIA) 10 MG tablet TAKE 1 TABLET BY MOUTH  DAILY  . fluticasone (FLONASE) 50 MCG/ACT nasal spray Place into both nostrils daily as needed for allergies or rhinitis.  Marland Kitchen glucose blood (ONE TOUCH ULTRA TEST) test strip Check blood sugar once daily and as instructed. Dx E11.9  . levocetirizine (XYZAL) 5 MG tablet Take 5 mg by mouth every evening.  Marland Kitchen lisinopril (ZESTRIL) 10 MG tablet Take 3 tablets (30 mg  total) by mouth at bedtime.  . metFORMIN (GLUCOPHAGE) 500 MG tablet TAKE 1-2 TABLETS BY MOUTH TWICE DAILY WITH A MEAL  . metoprolol succinate (TOPROL-XL) 50 MG 24 hr tablet TAKE 1 TABLET BY MOUTH  DAILY WITH OR IMMEDIATELY  FOLLOWING A MEAL  . Multiple Vitamin (MULTIVITAMIN) tablet Take 1 tablet by mouth daily.  Marland Kitchen NITROFURANTOIN PO Take 100 mg by mouth daily as needed.   Allison Yoder DELICA LANCETS 23F MISC Check blood sugar once daily and as instructed. Dx E11.9  . Vitamin D, Cholecalciferol, 1000 units CAPS Take 3,000 Units by mouth daily.   No facility-administered encounter medications on file as of 11/19/2020.   Contacted Allison Yoder to follow up on Dexcom status, she states she has not heard anything. I contacted Allison Yoder and they stated she was not approved. Patient must be on sliding scale insulin to be approved. Patient was contacted and made aware.    Follow-Up:  Pharmacist Review  Allison Yoder, CPP notified  Allison Yoder, [place] [phone]  Total time spent for month: 11

## 2020-11-24 ENCOUNTER — Other Ambulatory Visit: Payer: Self-pay | Admitting: Family Medicine

## 2020-12-07 ENCOUNTER — Telehealth: Payer: Self-pay

## 2020-12-07 DIAGNOSIS — E782 Mixed hyperlipidemia: Secondary | ICD-10-CM

## 2020-12-07 NOTE — Telephone Encounter (Signed)
Called and set up lft

## 2020-12-07 NOTE — Telephone Encounter (Signed)
-----   Message from Ramond Dial, [place] sent at 12/07/2020  7:45 AM EDT ----- Please call and set up labs just for LFTs ----- Message ----- From: Ramond Dial, RPH-CPP Sent: 12/04/2020  12:00 AM EDT To: Ramond Dial, RPH-CPP  Set up LFT- is she due to lipid panel?

## 2020-12-08 ENCOUNTER — Other Ambulatory Visit: Payer: Self-pay

## 2020-12-08 ENCOUNTER — Other Ambulatory Visit: Payer: Medicare Other

## 2020-12-08 DIAGNOSIS — E782 Mixed hyperlipidemia: Secondary | ICD-10-CM

## 2020-12-08 LAB — HEPATIC FUNCTION PANEL
ALT: 176 IU/L — ABNORMAL HIGH (ref 0–32)
AST: 141 IU/L — ABNORMAL HIGH (ref 0–40)
Albumin: 4.3 g/dL (ref 3.8–4.8)
Alkaline Phosphatase: 135 IU/L — ABNORMAL HIGH (ref 44–121)
Bilirubin Total: 0.6 mg/dL (ref 0.0–1.2)
Bilirubin, Direct: 0.23 mg/dL (ref 0.00–0.40)
Total Protein: 7 g/dL (ref 6.0–8.5)

## 2020-12-09 ENCOUNTER — Ambulatory Visit (INDEPENDENT_AMBULATORY_CARE_PROVIDER_SITE_OTHER): Payer: Medicare Other | Admitting: Psychology

## 2020-12-09 DIAGNOSIS — F419 Anxiety disorder, unspecified: Secondary | ICD-10-CM

## 2020-12-11 ENCOUNTER — Encounter: Payer: Self-pay | Admitting: Family Medicine

## 2020-12-11 ENCOUNTER — Ambulatory Visit (INDEPENDENT_AMBULATORY_CARE_PROVIDER_SITE_OTHER): Payer: Medicare Other | Admitting: Family Medicine

## 2020-12-11 ENCOUNTER — Other Ambulatory Visit: Payer: Self-pay

## 2020-12-11 VITALS — BP 130/68 | HR 86 | Temp 97.6°F | Ht 67.0 in | Wt 176.0 lb

## 2020-12-11 DIAGNOSIS — R0789 Other chest pain: Secondary | ICD-10-CM | POA: Diagnosis not present

## 2020-12-11 DIAGNOSIS — E119 Type 2 diabetes mellitus without complications: Secondary | ICD-10-CM | POA: Diagnosis not present

## 2020-12-11 DIAGNOSIS — K760 Fatty (change of) liver, not elsewhere classified: Secondary | ICD-10-CM

## 2020-12-11 DIAGNOSIS — Z659 Problem related to unspecified psychosocial circumstances: Secondary | ICD-10-CM

## 2020-12-11 DIAGNOSIS — B070 Plantar wart: Secondary | ICD-10-CM | POA: Diagnosis not present

## 2020-12-11 LAB — POCT GLYCOSYLATED HEMOGLOBIN (HGB A1C): Hemoglobin A1C: 7.5 % — AB (ref 4.0–5.6)

## 2020-12-11 MED ORDER — METFORMIN HCL 500 MG PO TABS: ORAL_TABLET | ORAL | Status: DC

## 2020-12-11 MED ORDER — NITROFURANTOIN MONOHYD MACRO 100 MG PO CAPS: 100.0000 mg | ORAL_CAPSULE | Freq: Every day | ORAL | 3 refills | Status: DC | PRN

## 2020-12-11 NOTE — Progress Notes (Signed)
This visit occurred during the SARS-CoV-2 public health emergency.  Safety protocols were in place, including screening questions prior to the visit, additional usage of staff PPE, and extensive cleaning of exam room while observing appropriate contact time as indicated for disinfecting solutions.  Diabetes:  Using medications without difficulties: yes, metformin 2 tabs BID.   Hypoglycemic episodes: no Hyperglycemic episodes: no Feet problems: some tingling at night, not noted in the day.   Blood Sugars averaging: max 260, after eating a bid meal.   eye exam within last year: yes A1c 7.5, sig improvement from prev.    LFT elevation: Dr. Johnsie Cancel would like her to hold zetia, but continue Praluent. She will call Dr. Watt Climes office and make an appointment with GI.  See result note.  No jaundice.  She has some occ L sided abd discomfort, minimal rare RUQ discomfort.  No sx currently.    She needed refill on macrobid.  rx sent.  Used as needed, discussed with patient.  She is caring for her grandson.  Her son is episodically available, per patient report.  She had to take out a restraining order on her son.  Patient has concern re: her son's h/o drug use.  She has gone to counseling in the meantime.    She has exertional chest heaviness.  Going on for about 5-6 months.  Not getting worse but not getting better.  No sx currently.    Meds, vitals, and allergies reviewed.  ROS: Per HPI unless specifically indicated in ROS section   GEN: nad, alert and oriented HEENT: ncat NECK: supple w/o LA CV: rrr. PULM: ctab, no inc wob ABD: soft, +bs EXT: no edema SKIN: no acute rash  Diabetic foot exam: Normal inspection except as below. No skin breakdown No calluses  Normal DP pulses Normal sensation to light touch and monofilament Nails normal  Plantar wart L 3rd toe.  Discussed options and after getting verbal informed consent frozen x3 with liquid nitrogen.  Tolerated well.  36 minutes were  devoted to patient care in this encounter (this includes time spent reviewing the patient's file/history, interviewing and examining the patient, counseling/reviewing plan with patient).

## 2020-12-11 NOTE — Patient Instructions (Addendum)
I sent a note to cardiology in the meantime.  Please call about seeing GI and limit exertion in the meantime.  If you have more chest pain, then go the ER.  Take care.  Glad to see you. Plan on recheck in about 3 months.  A1c at the visit.

## 2020-12-13 DIAGNOSIS — B070 Plantar wart: Secondary | ICD-10-CM | POA: Insufficient documentation

## 2020-12-13 DIAGNOSIS — Z659 Problem related to unspecified psychosocial circumstances: Secondary | ICD-10-CM | POA: Insufficient documentation

## 2020-12-13 NOTE — Assessment & Plan Note (Signed)
Exertional, I sent a note to cardiology at the office visit with plan for her to have follow-up with cardiology in the near future.  Routine cautions given to patient.  Limit exertion.  No symptoms currently.

## 2020-12-13 NOTE — Assessment & Plan Note (Signed)
Plantar wart L 3rd toe.  Discussed options and after getting verbal informed consent frozen x3 with liquid nitrogen.  Tolerated well.

## 2020-12-13 NOTE — Assessment & Plan Note (Signed)
A1c improved.  Continue Metformin.  Recheck periodically.

## 2020-12-13 NOTE — Assessment & Plan Note (Signed)
She is caring for her grandson.  Her son is episodically available, per patient report.  She had to take out a restraining order on her son.  Patient has concern re: her son's h/o drug use.  She has gone to counseling in the meantime.

## 2020-12-13 NOTE — Assessment & Plan Note (Signed)
With LFT elevation noted.  Benign abdominal exam at this point.  She will call GI office about follow-up.  She is going to hold Zetia but continue Prilosec per cardiology instruction.

## 2020-12-14 ENCOUNTER — Telehealth: Payer: Self-pay

## 2020-12-14 DIAGNOSIS — R0789 Other chest pain: Secondary | ICD-10-CM

## 2020-12-14 NOTE — Telephone Encounter (Signed)
-----   Message from Josue Hector, MD sent at 12/11/2020  5:54 PM EDT ----- Regarding: RE: please have this patient seen in your office soon. Pam- can you order lexiscan myovue and f/u with me or PA/NP  ----- Message ----- From: Tonia Ghent, MD Sent: 12/11/2020   4:43 PM EDT To: Josue Hector, MD Subject: please have this patient seen in your office#  My note isn't done yet from today but she is having recurrent chest heaviness with exertion.  Can you please get her seen sooner rather than later?  She doesn't have symptoms currently.    Thanks.   Allison Yoder

## 2020-12-14 NOTE — Telephone Encounter (Signed)
Called patient back. Moved her appointment up to May (first available with PA) instead in July. Will order stress test. Informed patient of instructions. Informed patient that someone will give her a call to make the appointment. Informed patient that instructions will also be sent through mychart.

## 2020-12-15 ENCOUNTER — Telehealth (HOSPITAL_COMMUNITY): Payer: Self-pay

## 2020-12-15 NOTE — Telephone Encounter (Signed)
Will forward to Dr. Johnsie Cancel to sign.

## 2020-12-15 NOTE — Telephone Encounter (Signed)
Close encounter 

## 2020-12-16 ENCOUNTER — Other Ambulatory Visit: Payer: Self-pay

## 2020-12-16 ENCOUNTER — Ambulatory Visit (HOSPITAL_COMMUNITY)
Admission: RE | Admit: 2020-12-16 | Discharge: 2020-12-16 | Disposition: A | Discharge status: Home / Self Care | Payer: Medicare Other | Source: Ambulatory Visit | Attending: Cardiology | Admitting: Cardiology

## 2020-12-16 DIAGNOSIS — R0789 Other chest pain: Secondary | ICD-10-CM | POA: Diagnosis not present

## 2020-12-16 LAB — MYOCARDIAL PERFUSION IMAGING
LV dias vol: 60 mL (ref 46–106)
LV sys vol: 14 mL
Peak HR: 108 {beats}/min
Rest HR: 75 {beats}/min
SDS: 2
SRS: 1
SSS: 3
TID: 0.81

## 2020-12-16 MED ORDER — TECHNETIUM TC 99M TETROFOSMIN IV KIT
10.1000 | PACK | Freq: Once | INTRAVENOUS | Status: AC | PRN
  Administered 2020-12-16: 10.1 via INTRAVENOUS
  Filled 2020-12-16: qty 11

## 2020-12-16 MED ORDER — REGADENOSON 0.4 MG/5ML IV SOLN
0.4000 mg | Freq: Once | INTRAVENOUS | Status: AC
Start: 2020-12-16 — End: 2020-12-16
  Administered 2020-12-16: 0.4 mg via INTRAVENOUS

## 2020-12-16 MED ORDER — AMINOPHYLLINE 25 MG/ML IV SOLN
75.0000 mg | Freq: Once | INTRAVENOUS | Status: AC
  Administered 2020-12-16: 75 mg via INTRAVENOUS

## 2020-12-16 MED ORDER — TECHNETIUM TC 99M TETROFOSMIN IV KIT
32.1000 | PACK | Freq: Once | INTRAVENOUS | Status: AC | PRN
  Administered 2020-12-16: 32.1 via INTRAVENOUS
  Filled 2020-12-16: qty 33

## 2020-12-17 ENCOUNTER — Other Ambulatory Visit: Payer: Self-pay | Admitting: Gastroenterology

## 2020-12-17 ENCOUNTER — Other Ambulatory Visit: Payer: Self-pay | Admitting: Family Medicine

## 2020-12-17 DIAGNOSIS — K5732 Diverticulitis of large intestine without perforation or abscess without bleeding: Secondary | ICD-10-CM | POA: Diagnosis not present

## 2020-12-17 DIAGNOSIS — R109 Unspecified abdominal pain: Secondary | ICD-10-CM | POA: Diagnosis not present

## 2020-12-17 DIAGNOSIS — K7689 Other specified diseases of liver: Secondary | ICD-10-CM | POA: Diagnosis not present

## 2020-12-17 DIAGNOSIS — I10 Essential (primary) hypertension: Secondary | ICD-10-CM

## 2020-12-17 DIAGNOSIS — R1084 Generalized abdominal pain: Secondary | ICD-10-CM

## 2020-12-23 ENCOUNTER — Other Ambulatory Visit: Payer: Self-pay

## 2020-12-23 ENCOUNTER — Ambulatory Visit (INDEPENDENT_AMBULATORY_CARE_PROVIDER_SITE_OTHER): Payer: Medicare Other | Admitting: Psychology

## 2020-12-23 ENCOUNTER — Telehealth: Payer: Self-pay

## 2020-12-23 ENCOUNTER — Ambulatory Visit (INDEPENDENT_AMBULATORY_CARE_PROVIDER_SITE_OTHER): Payer: Medicare Other

## 2020-12-23 ENCOUNTER — Ambulatory Visit: Payer: Medicare Other | Admitting: Psychology

## 2020-12-23 DIAGNOSIS — F419 Anxiety disorder, unspecified: Secondary | ICD-10-CM

## 2020-12-23 DIAGNOSIS — I10 Essential (primary) hypertension: Secondary | ICD-10-CM | POA: Diagnosis not present

## 2020-12-23 DIAGNOSIS — E782 Mixed hyperlipidemia: Secondary | ICD-10-CM

## 2020-12-23 DIAGNOSIS — E119 Type 2 diabetes mellitus without complications: Secondary | ICD-10-CM | POA: Diagnosis not present

## 2020-12-23 MED ORDER — LISINOPRIL 30 MG PO TABS: 30.0000 mg | ORAL_TABLET | Freq: Every day | ORAL | 3 refills | Status: DC

## 2020-12-23 NOTE — Telephone Encounter (Signed)
LMTCB

## 2020-12-23 NOTE — Telephone Encounter (Signed)
Pt reports she has been on lisinopril 30 mg daily for past 2 months. Recent rx sent in for 10 mg daily. She needs new prescription to reflect current dosing. She uses [facility].  Debbora Dus, PharmD Clinical Pharmacist [facility] [phone]

## 2020-12-23 NOTE — Progress Notes (Addendum)
Chronic Care Management Pharmacy Note  12/23/2020 Name:  Allison Yoder MRN:  [id number] DOB:  1951-03-09  Subjective: Allison Yoder is an 70 y.o. year old female who is a primary patient of Damita Dunnings, Elveria Rising, MD.  The CCM team was consulted for assistance with disease management and care coordination needs.    Engaged with patient by telephone for follow up visit in response to provider referral for pharmacy case management and/or care coordination services. Pt reports nuclear stress test normal 12/16/20. She also saw Dr. Watt Climes, GI. MRI abdomen scheduled. She remains off Zetia. Continues Praluent. Up to 30 mg daily lisinopril. Reports doing well.  Consent to Services:  The patient was given information about Chronic Care Management services, agreed to services, and gave verbal consent prior to initiation of services.  Please see initial visit note for detailed documentation.   Patient Care Team: Tonia Ghent, MD as PCP - General (Family Medicine) Josue Hector, MD as PCP - Cardiology (Cardiology) Debbora Dus, [facility] as Pharmacist (Pharmacist)  Recent office visits:  12/11/20 - PCP - Foot exam normal, A1c improving 7.5%. Cont metformin. Refill nitrofurantoin PRN use. LFT elevation, per Dr. Johnsie Cancel holding Zetia, continue Praluent, schedule appt with Dr. Watt Climes, GI. Chest heaviness periodic, send note to cardiology. Cardiology moved appt up to May.  11/19/20 - Pt denied for Dexcom  10/22/20 - CCM BG log review - Pt increased metformin to 2000 mg/day and lisinopril to 30 mg daily per PCP recommendation  10/11/20 - CCM Consult - Increase lisinopril to 20 mg daily  09/25/20 - CCM Initial visit    07/20/20: PCP - increase metformin to 1500 mg/day, recheck in 3 months, check coverage for Dexcom. GI referral  Recent consult visits:  05/28/20: Cardiology phone call - LDL wonderful! Down to 34 on Praluent 111m q 14 days. AST and ALT mildly elevated. Pt has hx of fatty liver. Will repeat in 4  weeks to make sure stable.   04/06/20: Diabetic eye exam   02/27/20: Rheumatology - OA, continue celecoxib, off statin, not sure if much improvement  12/30/19: [facility] in [facility] 6 [facility]  Objective:  Lab Results  Component Value Date   CREATININE 0.75 05/31/2019   BUN 16 05/31/2019   GFR 76.65 05/31/2019   GFRNONAA >60 12/01/2016   GFRAA >60 12/01/2016   NA 139 05/31/2019   K 4.8 05/31/2019   CALCIUM 9.6 05/31/2019   CO2 31 05/31/2019   GLUCOSE 194 (H) 05/31/2019    Lab Results  Component Value Date/Time   HGBA1C 7.5 (A) 12/11/2020 04:12 PM   HGBA1C 8.8 (A) 07/20/2020 04:17 PM   HGBA1C 8.2 (H) 05/31/2019 09:28 AM   HGBA1C 7.6 (H) 04/10/2018 08:13 AM   GFR 76.65 05/31/2019 09:28 AM   GFR 80.50 04/10/2018 08:13 AM    Last diabetic Eye exam:  Lab Results  Component Value Date/Time   HMDIABEYEEXA No Retinopathy 04/06/2020 12:00 AM    Last diabetic Foot exam: 12/11/20 per PCP normal   Lab Results  Component Value Date   CHOL 96 (L) 05/27/2020   HDL 39 (L) 05/27/2020   LDLCALC 34 05/27/2020   LDLDIRECT 66.0 08/15/2017   TRIG 133 05/27/2020   CHOLHDL 2.5 05/27/2020    Hepatic Function Latest Ref Rng & Units 12/08/2020 06/18/2020 05/27/2020  Total Protein 6.0 - 8.5 g/dL 7.0 6.5 6.7  Albumin 3.8 - 4.8 g/dL 4.3 4.2 4.1  AST 0 -  40 IU/L 141(H) 52(H) 59(H)  ALT 0 - 32 IU/L 176(H) 80(H) 81(H)  Alk Phosphatase 44 - 121 IU/L 135(H) 136(H) 119  Total Bilirubin 0.0 - 1.2 mg/dL 0.6 0.4 0.5  Bilirubin, Direct 0.00 - 0.40 mg/dL 0.23 0.13 0.17    Lab Results  Component Value Date/Time   TSH 2.95 05/31/2019 09:28 AM   TSH 2.28 06/08/2018 01:27 PM   FREET4 0.93 06/02/2016 11:57 AM    CBC Latest Ref Rng & Units 05/31/2019 08/15/2017 03/22/2017  WBC 4.0 - 10.5 K/uL 7.3 6.8 14.5(H)  Hemoglobin 12.0 - 15.0 g/dL 13.9 14.1 13.9  Hematocrit 36.0 - 46.0 % 41.6 42.4 40.9  Platelets 150.0 - 400.0 K/uL 212.0 211.0 214.0     No results found for: VD25OH  Clinical ASCVD: Yes  The ASCVD Risk score Mikey Bussing DC Jr., et al., 2013) failed to calculate for the following reasons:   The patient has a prior MI or stroke diagnosis    Depression screen [facility] 2/9 07/28/2020 06/27/2019 06/08/2018  Decreased Interest 0 0 0  Down, Depressed, Hopeless 0 0 0  PHQ - 2 Score 0 0 0  Altered sleeping - 0 -  Tired, decreased energy - 0 -  Change in appetite - 0 -  Feeling bad or failure about yourself  - 0 -  Trouble concentrating - 0 -  Moving slowly or fidgety/restless - 0 -  Suicidal thoughts - 0 -  PHQ-9 Score - 0 -  Difficult doing work/chores - Not difficult at all -  Some recent data might be hidden     Social History   Tobacco Use  Smoking Status Former Smoker  . Packs/day: 2.00  . Years: 5.00  . Pack years: 10.00  . Types: Cigarettes  . Quit date: 09/12/1985  . Years since quitting: 35.3  Smokeless Tobacco Never Used   BP Readings from Last 3 Encounters:  12/11/20 130/68  08/11/20 (!) 161/91  07/20/20 (!) 142/80   Pulse Readings from Last 3 Encounters:  12/11/20 86  08/11/20 77  07/20/20 88   Wt Readings from Last 3 Encounters:  12/16/20 176 lb (79.8 kg)  12/11/20 176 lb (79.8 kg)  08/11/20 178 lb (80.7 kg)   BMI Readings from Last 3 Encounters:  12/16/20 27.57 kg/m  12/11/20 27.57 kg/m  08/11/20 28.30 kg/m    Assessment/Interventions: Review of patient past medical history, allergies, medications, health status, including review of consultants reports, laboratory and other test data, was performed as part of comprehensive evaluation and provision of chronic care management services.   SDOH:  (Social Determinants of Health) assessments and interventions performed: Yes SDOH Interventions   Flowsheet Row Most Recent Value  SDOH Interventions   Financial Strain Interventions Intervention Not Indicated  [Meds affordable]     SDOH Screenings   Alcohol Screen: Not on file  Depression  (PHQ2-9): Low Risk   . PHQ-2 Score: 0  Financial Resource Strain: Low Risk   . Difficulty of Paying Living Expenses: Not very hard  Food Insecurity: Not on file  Housing: Not on file  Physical Activity: Not on file  Social Connections: Not on file  Stress: Not on file  Tobacco Use: Medium Risk  . Smoking Tobacco Use: Former Smoker  . Smokeless Tobacco Use: Never Used  Transportation Needs: Not on file    CCM Care Plan  Allergies  Allergen Reactions  . Cephalexin Itching    Does tolerate augmentin  . Codeine Itching and Rash  . Inapsine [Droperidol] Shortness  Of Breath, Anxiety and Hypertension    Elevated HR and BP, panic attack  . Crestor [Rosuvastatin]     Aches  . Lipitor [Atorvastatin Calcium]     Myalgias   . Niacin And Related     Flushing  . Pravastatin     Myalgias  . Adhesive [Tape] Rash    Blisters with tape    Medications Reviewed Today    Reviewed by Debbora Dus, [facility] (Pharmacist) on 12/23/20 at 1258  Med List Status: <None>  Medication Order Taking? Sig Documenting Provider Last Dose Status Informant  albuterol (VENTOLIN HFA) 108 (90 Base) MCG/ACT inhaler [id number]  INHALE 1 TO 2 PUFFS INTO THE LUNGS EVERY 6 HOURS AS NEEDED FOR WHEEZING OR SHORTNESS OF BREATH Tonia Ghent, MD  Active   Alirocumab (PRALUENT) 150 MG/ML Darden Palmer [id number]  Inject 1 pen into the skin every 14 (fourteen) days. Josue Hector, MD  Active   benzonatate (TESSALON) 200 MG capsule [id number]  Take 1 capsule (200 mg total) by mouth 3 (three) times daily as needed for cough. Swallow whole, do not bite pill Tower, Wynelle Fanny, MD  Active   Blood Glucose Monitoring Suppl (ONE TOUCH ULTRA 2) w/Device KIT [id number]  Check blood sugar once daily and as instructed. Dx E11.9 Tonia Ghent, MD  Active   celecoxib (CELEBREX) 100 MG capsule [id number]  Take 1 capsule (100 mg total) by mouth daily. Tonia Ghent, MD  Active   citalopram (CELEXA) 20 MG tablet [id number] Yes TAKE 1 TABLET BY MOUTH   DAILY Tonia Ghent, MD Taking Active   clonazePAM [facility]) 0.5 MG tablet [id number] Yes Take 1 tablet (0.5 mg total) by mouth daily as needed for anxiety. Tonia Ghent, MD Taking Active   cyanocobalamin 1000 MCG tablet [id number]  Take 1,000 mcg by mouth daily. [provider]  Active   cyclobenzaprine (FLEXERIL) 10 MG tablet [id number]  Take 1 tablet (10 mg total) by mouth daily as needed for muscle spasms. Tonia Ghent, MD  Active   fluticasone Asencion [facility]) 50 MCG/ACT nasal spray [id number]  Place into both nostrils daily as needed for allergies or rhinitis. [provider]  Active   glucose blood (ONE TOUCH ULTRA TEST) test strip [id number]  Check blood sugar once daily and as instructed. Dx E11.9 Tonia Ghent, MD  Active   lisinopril (ZESTRIL) 10 MG tablet [id number] Yes TAKE 1 TABLET BY MOUTH AT  BEDTIME  Patient taking differently: 30 mg daily. 3 tablets (30 mg) daily   Tonia Ghent, MD Taking Active Self  metFORMIN (GLUCOPHAGE) 500 MG tablet [id number] Yes TAKE 2 TABLETS BY MOUTH TWICE DAILY WITH A MEAL Tonia Ghent, MD Taking Active   metoprolol succinate (TOPROL-XL) 50 MG 24 hr tablet [id number] Yes TAKE 1 TABLET BY MOUTH  DAILY WITH OR IMMEDIATELY  FOLLOWING A MEAL Tonia Ghent, MD Taking Active   Multiple Vitamin (MULTIVITAMIN) tablet [id number]  Take 1 tablet by mouth daily. [provider]  Active Self  nitrofurantoin, macrocrystal-monohydrate, (MACROBID) 100 MG capsule [id number]  Take 1 capsule (100 mg total) by mouth daily as needed. Tonia Ghent, MD  Active   omeprazole [facility]) 40 MG capsule [id number]   [provider]  Active   [facility] LANCETS 70W MISC [id number]  Check blood sugar once daily and as instructed. Dx E11.9 Tonia Ghent, MD  Active   Vitamin D, Cholecalciferol, 1000 units CAPS [id number]  Take 3,000 Units by mouth daily. [provider]  Active           Patient Active Problem List    Diagnosis Date Noted  . Other social stressor 12/13/2020  . Plantar wart 12/13/2020  . Dysphagia 07/22/2020  . Healthcare maintenance 07/22/2019  . Cough 07/22/2019  . Arthralgia 07/22/2019  . Medicare annual wellness visit, initial 06/10/2018  . Chest heaviness 06/10/2018  . Osteopenia 09/10/2017  . Fatty liver 05/19/2017  . Diabetes mellitus ([place]) 05/05/2017  . Cerebrovascular accident (CVA) due to thrombosis of left posterior cerebral artery ([place]) 04/08/2016  . Hypertensive disorder 10/27/2015  . Advance care planning 04/29/2015  . Obstructive sleep apnea of adult 07/25/2014  . Palpitations 01/06/2013  . Anxiety 09/01/2012  . MDD (major depressive disorder) 12/19/2011  . OA (osteoarthritis) 12/19/2011  . Hyperlipidemia 12/19/2011  . Diverticulitis 12/19/2011  . Migraines 12/19/2011  . Hypothyroid 12/19/2011  . Iritis 12/19/2011    Immunization History  Administered Date(s) Administered  . Fluad Quad(high Dose 65+) 05/31/2019, 06/22/2020  . Hepatitis B, adult 07/19/2013, 08/27/2013, 04/04/2014  . Influenza, High Dose Seasonal PF 08/01/2018  . Influenza,inj,Quad PF,6+ Mos 07/25/2014  . Influenza-Unspecified 05/14/2015, 05/13/2016, 06/08/2017  . PFIZER(Purple Top)SARS-COV-2 Vaccination 10/08/2019, 10/29/2019, 06/22/2020  . Pneumococcal Conjugate-13 02/26/2014  . Pneumococcal Polysaccharide-23 05/06/2016  . Tdap 06/25/2012  . Zoster 09/10/2012    Conditions to be addressed/monitored:  Hypertension, Hyperlipidemia, Diabetes, Depression and Anxiety  Care Plan : [place]  Updates made by Debbora Dus, [place] since 12/23/2020 12:00 AM    Problem: CHL AMB "PATIENT-SPECIFIC PROBLEM"     Long-Range Goal: Disease Management   Start Date: 12/23/2020  Priority: High  Note:    Current Barriers:  . Blood glucose not ideally controlled - A1c 7.5% but improving   Pharmacist Clinical Goal(s):  Marland Kitchen Patient will contact provider office for questions/concerns as  evidenced notation of same in electronic health record through collaboration with PharmD and provider.   Interventions: . 1:1 collaboration with Tonia Ghent, MD regarding development and update of comprehensive plan of care as evidenced by provider attestation and co-signature . Inter-disciplinary care team collaboration (see longitudinal plan of care) . Comprehensive medication review performed; medication list updated in electronic medical record  Hypertension (BP goal <130/80) -Controlled - 130/68 at last office visit  -Current treatment:  Lisinopril  10 mg - 3 tablets daily   Metoprolol succinate 50 mg  - 1 daily --Medications previously tried: atenolol -Wears CPAP routinely  -Pt on lisinopril 30 mg daily since 10/22/20, needs updated prescription -Current home readings: Omron wrist cuff, none recent but clinic readings within goal and pt reports home readings within goal  -Denies hypotensive/hypertensive symptom -Counseled to monitor BP at home monthly, document, and provide log at future appointments -Recommended to continue current medication; Request prescription for lisinopril 30 mg daily from PCP. BP log review in 2 months.  Hyperlipidemia: (LDL goal < 70) Query-Controlled - last LDL < 70, lipid panel has not been updated off Zetia  -Current treatment:  Zetia 10 mg - 1 tablet daily (on HOLD due to elevated LFTs)  Praluent - Inject every 14 days  ([employer]Well grant) -Medications previously tried: Simvastatin (myalgias), atorvastatin 63m (myalgias) rosuvastatin 533mdaily (myalgias, fatigue) -Zetia on hold due to elevated LFTs, followed by cardiology -Recommended to continue current medication  Diabetes (A1c goal <7%) -Not ideally controlled - A1c 7.5% improved from 8.8% -Current medications:  Metformin 500 mg - 2 tablets twice daily (2000 mg/day) -Medications previously tried: none reported -Current home  glucose readings . fasting glucose: 140-160 . Before meals  150 . post prandial glucose: 190-210 -Denies hypoglycemic/hyperglycemic symptoms -Current meal patterns: last visit reported poor diet, pt in a hurry today at work, unable to discuss diet -Current exercise: stays active caring for grandson  -Counseled to check feet daily and get yearly eye exams -Recommended to continue current medication; BG log review in 2 months   Depression/Anxiety (Goal: Control symptoms) -Not ideally controlled - situational stressors, recently established with therapist  -Current treatment:  Citalopram 20 mg - 1 tablet daily  Clonazepam 0.5 mg - 1/2 tablet PRN  -Medications previously tried/failed: none reported -Last visit 09/25/20 she reported taking clonazepam 1/2 tablet 1-2 days per week in the afternoon and Celexa daily since 1993. Overwhelmed with family and caregiver role. -Today 4/13, she reports established care with therapist. She has second visit today at 4 PM. Continues medications same. -Recommended to continue current medication and therapy  Patient Goals/Self-Care Activities . Patient will:  - check glucose 2-3 days per week before meals, 2 hours after meals, or bedtime, document, and provide at future appointments check blood pressure 2-3 days per week, document, and provide at future appointments  Follow Up Plan: The care management team will reach out to the patient again over the next 60 days. for blood pressure and blood glucose log review      Medication Assistance: None required.  Patient affirms current coverage meets needs.  Patient's preferred pharmacy is:  [facility], [address], [address], [place] [address] Phone: [phone] Fax: [phone]  [facility] [place], [address] [place] [address] Phone: [phone] Fax: [phone]  Using Mail order for  maintenance medications   Care Plan and Follow Up Patient Decision:  Patient agrees to Care Plan and Follow-up.  Debbora Dus, PharmD Clinical Pharmacist [facility] at [facility] [phone]  Encounter details: CCM Time Spent      Value Time User   Time spent with patient (minutes)  30 12/23/2020  1:20 PM Debbora Dus, [facility]   Time spent performing Chart review  30 12/23/2020  1:20 PM Debbora Dus, [facility]   Total time (minutes)  60 12/23/2020  1:20 PM Debbora Dus, RPH     Moderate to High Complex Decision Making      Value Time User   Moderate to High complex decision making  Yes 12/23/2020  1:20 PM Debbora Dus, [facility]     CCM Services: This encounter meets complex CCM services and moderate to high decision making.  Prior to outreach and patient consent for Chronic Care Management, I referred this patient for services after reviewing the nominated patient list or from a personal encounter with the patient.  I have personally reviewed this encounter including the documentation in this note and have collaborated with the care management provider regarding care management and care coordination activities to include development and update of the comprehensive care plan. I am certifying that I agree with the content of this note and encounter as supervising physician. Elsie Stain

## 2020-12-23 NOTE — Telephone Encounter (Signed)
Patient aware new rx was sent and directions to take.

## 2020-12-23 NOTE — Telephone Encounter (Signed)
Please update patient.  I corrected the rx.  New rx is for 30 mg tablets so she will only need to take 1 a day.  Thanks.

## 2020-12-23 NOTE — Patient Instructions (Signed)
Dear Allison Yoder,  Below is a summary of the goals we discussed during our follow up appointment on December 23, 2020. Please contact me anytime with questions or concerns.   Visit Information  Patient Care Plan: CCM Pharmacy Care Plan    Problem Identified: CHL AMB "PATIENT-SPECIFIC PROBLEM"     Long-Range Goal: Disease Management   Start Date: 12/23/2020  Priority: High  Note:    Current Barriers:  . Blood glucose not ideally controlled - A1c 7.5% but improving   Pharmacist Clinical Goal(s):  Marland Kitchen Patient will contact provider office for questions/concerns as evidenced notation of same in electronic health record through collaboration with PharmD and provider.   Interventions: . 1:1 collaboration with Tonia Ghent, MD regarding development and update of comprehensive plan of care as evidenced by provider attestation and co-signature . Inter-disciplinary care team collaboration (see longitudinal plan of care) . Comprehensive medication review performed; medication list updated in electronic medical record  Hypertension (BP goal <130/80) -Controlled - 130/68 at last office visit  -Current treatment:  Lisinopril  10 mg - 3 tablets daily   Metoprolol succinate 50 mg  - 1 daily --Medications previously tried: atenolol -Wears CPAP routinely  -Pt on lisinopril 30 mg daily since 10/22/20, needs updated prescription -Current home readings: Omron wrist cuff, none recent but clinic readings within goal and pt reports home readings within goal  -Denies hypotensive/hypertensive symptom -Counseled to monitor BP at home monthly, document, and provide log at future appointments -Recommended to continue current medication; Request prescription for lisinopril 30 mg daily from PCP. BP log review in 2 months.  Hyperlipidemia: (LDL goal < 70) Query-Controlled - last LDL < 70, lipid panel has not been updated off Zetia  -Current treatment:  Zetia 10 mg - 1 tablet daily (on HOLD due to  elevated LFTs)  Praluent - Inject every 14 days  (HealthWell grant) -Medications previously tried: Simvastatin (myalgias), atorvastatin 20mg  (myalgias) rosuvastatin 5mg  daily (myalgias, fatigue) -Zetia on hold due to elevated LFTs, followed by cardiology -Recommended to continue current medication  Diabetes (A1c goal <7%) -Not ideally controlled - A1c 7.5% improved from 8.8% -Current medications:  Metformin 500 mg - 2 tablets twice daily (2000 mg/day) -Medications previously tried: none reported -Current home glucose readings . fasting glucose: 140-160 . Before meals 150 . post prandial glucose: 190-210 -Denies hypoglycemic/hyperglycemic symptoms -Current meal patterns: last visit reported poor diet, pt in a hurry today at work, unable to discuss diet -Current exercise: stays active caring for grandson  -Counseled to check feet daily and get yearly eye exams -Recommended to continue current medication; BG log review in 2 months   Depression/Anxiety (Goal: Control symptoms) -Not ideally controlled - situational stressors, recently established with therapist  -Current treatment:  Citalopram 20 mg - 1 tablet daily  Clonazepam 0.5 mg - 1/2 tablet PRN  -Medications previously tried/failed: none reported -Last visit 09/25/20 she reported taking clonazepam 1/2 tablet 1-2 days per week in the afternoon and Celexa daily since 1993. Overwhelmed with family and caregiver role. -Today 4/13, she reports established care with therapist. She has second visit today at 4 PM. Continues medications same. -Recommended to continue current medication and therapy  Patient Goals/Self-Care Activities . Patient will:  - check glucose 2-3 days per week before meals, 2 hours after meals, or bedtime, document, and provide at future appointments check blood pressure 2-3 days per week, document, and provide at future appointments  Follow Up Plan: The care management team will reach out to  the patient again  over the next 60 days. for blood pressure and blood glucose log review       Patient verbalizes understanding of instructions provided today and agrees to view in [place].   Debbora Dus, PharmD Clinical Pharmacist [place] [facility] at [facility] [phone]   Blood Pressure Record Sheet To take your blood pressure, you will need a blood pressure machine. You can buy a blood pressure machine (blood pressure monitor) at your clinic, drug store, or online. When choosing one, consider:  An automatic monitor that has an arm cuff.  A cuff that wraps snugly around your upper arm. You should be able to fit only one finger between your arm and the cuff.  A device that stores blood pressure reading results.  Do not choose a monitor that measures your blood pressure from your wrist or finger. Follow your health care provider's instructions for how to take your blood pressure. To use this form:  Get one reading in the morning (a.m.) before you take any medicines.  Get one reading in the evening (p.m.) before supper.  Take at least 2 readings with each blood pressure check. This makes sure the results are correct. Wait 1-2 minutes between measurements.  Write down the results in the spaces on this form.  Repeat this once a week, or as told by your health care provider.  Make a follow-up appointment with your health care provider to discuss the results. Blood pressure log Date: _______________________  a.m. _____________________(1st reading) _____________________(2nd reading)  p.m. _____________________(1st reading) _____________________(2nd reading) Date: _______________________  a.m. _____________________(1st reading) _____________________(2nd reading)  p.m. _____________________(1st reading) _____________________(2nd reading) Date: _______________________  a.m. _____________________(1st reading) _____________________(2nd reading)  p.m. _____________________(1st  reading) _____________________(2nd reading) Date: _______________________  a.m. _____________________(1st reading) _____________________(2nd reading)  p.m. _____________________(1st reading) _____________________(2nd reading) Date: _______________________  a.m. _____________________(1st reading) _____________________(2nd reading)  p.m. _____________________(1st reading) _____________________(2nd reading) This information is not intended to replace advice given to you by your health care provider. Make sure you discuss any questions you have with your health care provider. Document Revised: 12/18/2019 Document Reviewed: 12/18/2019 Elsevier Patient Education  2021 [employer].

## 2020-12-28 ENCOUNTER — Ambulatory Visit (INDEPENDENT_AMBULATORY_CARE_PROVIDER_SITE_OTHER): Payer: Medicare Other | Admitting: Podiatry

## 2020-12-28 ENCOUNTER — Other Ambulatory Visit: Payer: Self-pay

## 2020-12-28 DIAGNOSIS — D2372 Other benign neoplasm of skin of left lower limb, including hip: Secondary | ICD-10-CM | POA: Diagnosis not present

## 2020-12-28 DIAGNOSIS — M79672 Pain in left foot: Secondary | ICD-10-CM

## 2020-12-28 DIAGNOSIS — M2042 Other hammer toe(s) (acquired), left foot: Secondary | ICD-10-CM

## 2020-12-28 NOTE — Patient Instructions (Signed)
Keep the bandage on for 24 hours. At that time, remove and clean with soap and water. If it hurts or burns before 24 hours go ahead and remove the bandage and wash with soap and water. Keep the area clean. If there is any blistering cover with antibiotic ointment and a bandage. Monitor for any redness, drainage, or other signs of infection. Call the office if any are to occur. If you have any questions, please call the office at [phone].  

## 2020-12-30 ENCOUNTER — Other Ambulatory Visit: Payer: Self-pay | Admitting: Gastroenterology

## 2020-12-30 DIAGNOSIS — R109 Unspecified abdominal pain: Secondary | ICD-10-CM

## 2020-12-30 DIAGNOSIS — R945 Abnormal results of liver function studies: Secondary | ICD-10-CM

## 2020-12-30 DIAGNOSIS — K5732 Diverticulitis of large intestine without perforation or abscess without bleeding: Secondary | ICD-10-CM

## 2020-12-31 NOTE — Progress Notes (Signed)
Subjective:   Patient ID: Allison Yoder, female   DOB: 70 y.o.   MRN: [id number]   HPI 70 year old female presents the office today with concerns of left foot discomfort.  She thinks that she may have a wart or skin lesion on the distal aspect of the third digit which is causing pain, burning with walking and wearing shoes.  She is not sure how long its been present.  Denies any open sores or drainage or any swelling.  No recent treatment.  No other concerns.   Review of Systems  All other systems reviewed and are negative.  Past Medical History:  Diagnosis Date  . Allergy   . Anemia    "all the time as a child"  . Anxiety   . Arthritis    "all my joints; worse in my hands" (10/27/2015)  . Chronic lower back pain   . Depression   . Diverticulosis   . Fatty liver   . GERD (gastroesophageal reflux disease)   . Heart murmur    "dx'd years ago; very mild; never treated" (10/27/2015)  . Hyperlipidemia   . Hypertension   . Hypothyroidism 2002-~ 2010  . Iritis    per [facility]  . Migraines    "stopped when I went thru menopause"  . OSA on CPAP   . Pneumonia ~ 2013; 10/27/2015  . Recurrent urinary tract infection   . Routine general medical examination at a health care facility 02/26/2014  . Snores   . Stroke ([place])   . Type II diabetes mellitus ([place])    "lost weight; started exercising again; don't have it anymore" (10/27/2015)  . Urinary incontinence   . Wears glasses     Past Surgical History:  Procedure Laterality Date  . BLEPHAROPLASTY Bilateral 2013; 2016  . BREAST LUMPECTOMY Right 1964   benign tumor,  . HEMORRHOID BANDING  X 2  . KNEE ARTHROSCOPY Left 2016   "meniscus repair"  . PELVIC FLOOR REPAIR  2001   "lift"  . POLYPECTOMY  X 3   "bladder"  . SHOULDER ARTHROSCOPY W/ ROTATOR CUFF REPAIR Left 2013  . SHOULDER ARTHROSCOPY W/ ROTATOR CUFF REPAIR Right 2015  . TUBAL LIGATION  ~ 1986  . VAGINAL HYSTERECTOMY  1992   Partial     Current Outpatient  Medications:  .  albuterol (VENTOLIN HFA) 108 (90 Base) MCG/ACT inhaler, INHALE 1 TO 2 PUFFS INTO THE LUNGS EVERY 6 HOURS AS NEEDED FOR WHEEZING OR SHORTNESS OF BREATH, Disp: 25.5 g, Rfl: 1 .  Alirocumab (PRALUENT) 150 MG/ML SOAJ, Inject 1 pen into the skin every 14 (fourteen) days., Disp: 2 pen, Rfl: 11 .  benzonatate (TESSALON) 200 MG capsule, Take 1 capsule (200 mg total) by mouth 3 (three) times daily as needed for cough. Swallow whole, do not bite pill, Disp: 30 capsule, Rfl: 1 .  Blood Glucose Monitoring Suppl (ONE TOUCH ULTRA 2) w/Device KIT, Check blood sugar once daily and as instructed. Dx E11.9, Disp: 1 each, Rfl: 0 .  celecoxib (CELEBREX) 100 MG capsule, Take 1 capsule (100 mg total) by mouth daily., Disp: , Rfl:  .  citalopram (CELEXA) 20 MG tablet, TAKE 1 TABLET BY MOUTH  DAILY, Disp: 90 tablet, Rfl: 3 .  clonazePAM (KLONOPIN) 0.5 MG tablet, Take 1 tablet (0.5 mg total) by mouth daily as needed for anxiety., Disp: 30 tablet, Rfl: 1 .  cyanocobalamin 1000 MCG tablet, Take 1,000 mcg by mouth daily., Disp: , Rfl:  .  cyclobenzaprine (FLEXERIL)  10 MG tablet, Take 1 tablet (10 mg total) by mouth daily as needed for muscle spasms., Disp: 90 tablet, Rfl: 3 .  fluticasone (FLONASE) 50 MCG/ACT nasal spray, Place into both nostrils daily as needed for allergies or rhinitis., Disp: , Rfl:  .  glucose blood (ONE TOUCH ULTRA TEST) test strip, Check blood sugar once daily and as instructed. Dx E11.9, Disp: 100 each, Rfl: 1 .  lisinopril (ZESTRIL) 30 MG tablet, Take 1 tablet (30 mg total) by mouth daily., Disp: 90 tablet, Rfl: 3 .  metFORMIN (GLUCOPHAGE) 500 MG tablet, TAKE 2 TABLETS BY MOUTH TWICE DAILY WITH A MEAL, Disp: , Rfl:  .  metoprolol succinate (TOPROL-XL) 50 MG 24 hr tablet, TAKE 1 TABLET BY MOUTH  DAILY WITH OR IMMEDIATELY  FOLLOWING A MEAL, Disp: 90 tablet, Rfl: 3 .  Multiple Vitamin (MULTIVITAMIN) tablet, Take 1 tablet by mouth daily., Disp: , Rfl:  .  nitrofurantoin,  macrocrystal-monohydrate, (MACROBID) 100 MG capsule, Take 1 capsule (100 mg total) by mouth daily as needed., Disp: 90 capsule, Rfl: 3 .  omeprazole (PRILOSEC) 40 MG capsule, , Disp: , Rfl:  .  ONETOUCH DELICA LANCETS 26O MISC, Check blood sugar once daily and as instructed. Dx E11.9, Disp: 100 each, Rfl: 1 .  Vitamin D, Cholecalciferol, 1000 units CAPS, Take 3,000 Units by mouth daily., Disp: , Rfl:   Allergies  Allergen Reactions  . Cephalexin Itching    Does tolerate augmentin  . Codeine Itching and Rash  . Inapsine [Droperidol] Shortness Of Breath, Anxiety and Hypertension    Elevated HR and BP, panic attack  . Crestor [Rosuvastatin]     Aches  . Lipitor [Atorvastatin Calcium]     Myalgias   . Niacin And Related     Flushing  . Pravastatin     Myalgias  . Adhesive [Tape] Rash    Blisters with tape        Objective:  Physical Exam  General: AAO x3, NAD  Dermatological: Hyperkeratotic lesion of the distal aspect of the left third toe.  Upon debridement there is no underlying ulceration drainage or any signs of infection.  There are no open lesions identified.  Vascular: Dorsalis Pedis artery and Posterior Tibial artery pedal pulses are 2/4 bilateral with immedate capillary fill time.  There is no pain with calf compression, swelling, warmth, erythema.   Neruologic: Grossly intact via light touch bilateral.   Musculoskeletal: Hammertoes are present resulting in hyperkeratotic lesion.  Muscular strength 5/5 in all groups tested bilateral.  Gait: Unassisted, Nonantalgic.       Assessment:   Hammertoe deformity resulting hyperkeratotic lesion distal third toe left foot     Plan:  -Treatment options discussed including all alternatives, risks, and complications -Etiology of symptoms were discussed -Sharply debrided the hyperkeratotic lesion without any complications or bleeding.  Skin was cleaned with alcohol and a pad was placed followed by salicylic acid and a  bandage.  Post procedure instructions were discussed.  Monitor for any signs or symptoms of infection. -Offloading for hammertoes  Trula Slade DPM

## 2021-01-04 ENCOUNTER — Ambulatory Visit
Admission: RE | Admit: 2021-01-04 | Discharge: 2021-01-04 | Disposition: A | Discharge status: Home / Self Care | Payer: Medicare Other | Source: Ambulatory Visit | Attending: Gastroenterology | Admitting: Gastroenterology

## 2021-01-04 DIAGNOSIS — R945 Abnormal results of liver function studies: Secondary | ICD-10-CM

## 2021-01-04 DIAGNOSIS — R109 Unspecified abdominal pain: Secondary | ICD-10-CM

## 2021-01-04 DIAGNOSIS — K5732 Diverticulitis of large intestine without perforation or abscess without bleeding: Secondary | ICD-10-CM

## 2021-01-06 ENCOUNTER — Ambulatory Visit: Payer: Medicare Other | Admitting: Psychology

## 2021-01-13 ENCOUNTER — Ambulatory Visit (INDEPENDENT_AMBULATORY_CARE_PROVIDER_SITE_OTHER): Payer: Medicare Other | Admitting: Psychology

## 2021-01-13 DIAGNOSIS — F419 Anxiety disorder, unspecified: Secondary | ICD-10-CM

## 2021-01-20 ENCOUNTER — Ambulatory Visit: Payer: Medicare Other | Admitting: Physician Assistant

## 2021-01-20 ENCOUNTER — Ambulatory Visit (INDEPENDENT_AMBULATORY_CARE_PROVIDER_SITE_OTHER): Payer: Medicare Other | Admitting: Psychology

## 2021-01-20 DIAGNOSIS — F419 Anxiety disorder, unspecified: Secondary | ICD-10-CM

## 2021-01-26 ENCOUNTER — Ambulatory Visit: Payer: Medicare Other | Admitting: Psychology

## 2021-01-28 ENCOUNTER — Ambulatory Visit (INDEPENDENT_AMBULATORY_CARE_PROVIDER_SITE_OTHER): Payer: Medicare Other | Admitting: Family Medicine

## 2021-01-28 ENCOUNTER — Other Ambulatory Visit: Payer: Self-pay

## 2021-01-28 ENCOUNTER — Ambulatory Visit (INDEPENDENT_AMBULATORY_CARE_PROVIDER_SITE_OTHER)
Admission: RE | Admit: 2021-01-28 | Discharge: 2021-01-28 | Disposition: A | Discharge status: Home / Self Care | Payer: Medicare Other | Source: Ambulatory Visit | Attending: Family Medicine | Admitting: Family Medicine

## 2021-01-28 ENCOUNTER — Encounter: Payer: Self-pay | Admitting: Family Medicine

## 2021-01-28 VITALS — BP 128/82 | HR 78 | Temp 97.9°F | Ht 67.0 in | Wt 179.0 lb

## 2021-01-28 DIAGNOSIS — M25551 Pain in right hip: Secondary | ICD-10-CM | POA: Diagnosis not present

## 2021-01-28 DIAGNOSIS — Z659 Problem related to unspecified psychosocial circumstances: Secondary | ICD-10-CM

## 2021-01-28 DIAGNOSIS — M47816 Spondylosis without myelopathy or radiculopathy, lumbar region: Secondary | ICD-10-CM | POA: Diagnosis not present

## 2021-01-28 DIAGNOSIS — K7689 Other specified diseases of liver: Secondary | ICD-10-CM | POA: Diagnosis not present

## 2021-01-28 DIAGNOSIS — M1611 Unilateral primary osteoarthritis, right hip: Secondary | ICD-10-CM | POA: Diagnosis not present

## 2021-01-28 MED ORDER — CELECOXIB 100 MG PO CAPS: 200.0000 mg | ORAL_CAPSULE | Freq: Every day | ORAL | Status: DC

## 2021-01-28 NOTE — Patient Instructions (Signed)
Go to the lab on the way out.   If you have mychart we'll likely use that to update you.    Please get me a copy of your labs from the GI clinic.  Try taking 2 celebrex a day for now with food.  Ice your right lateral hip.  Take care.  Glad to see you.

## 2021-01-28 NOTE — Progress Notes (Signed)
This visit occurred during the SARS-CoV-2 public health emergency.  Safety protocols were in place, including screening questions prior to the visit, additional usage of staff PPE, and extensive cleaning of exam room while observing appropriate contact time as indicated for disinfecting solutions.  R hip pain.  Getting worse in the last month or so.  Taking celebrex 100mg  a day.  More pain sitting in the car.  R buttock and groin pain, can radiate into the R hamstring/upper thigh.  Doesn't radiate down to the foot.  No L sided pain.  Pain with int rotation. Pain going up and down steps.  No recent trauma.  No bruising.  She had fallen in 09/2020, but his on L buttock at the time, more than R side.  Normal sensation BLE.  Normal grip B hands.  Tylenol helps some.    Stressors d/w pt, esp caring for her grandson.  She needs refill on Klonopin.  Use as needed.  There is a question of possible Paget's disease on the bone on previous imaging.  She has follow-up LFTs pending with GI clinic.  Meds, vitals, and allergies reviewed.   ROS: Per HPI unless specifically indicated in ROS section   nad ncat Neck supple, no LA rrr ctab Abd soft not ttp normal bowel sounds Extremities well perfused. Back not tender to palpation in the midline.  Right trochanteric bursa is tender to palpation.  Straight leg raise negative but she has tight right hamstring.  She does have some discomfort on internal and external rotation of the right hip.

## 2021-01-29 ENCOUNTER — Other Ambulatory Visit: Payer: Self-pay | Admitting: Family Medicine

## 2021-01-31 DIAGNOSIS — M25551 Pain in right hip: Secondary | ICD-10-CM | POA: Insufficient documentation

## 2021-01-31 MED ORDER — CLONAZEPAM 0.5 MG PO TABS: 0.5000 mg | ORAL_TABLET | Freq: Every day | ORAL | 1 refills | Status: DC | PRN

## 2021-01-31 NOTE — Assessment & Plan Note (Addendum)
Continue as needed Klonopin.  Prescription sent.  Affect normal.  Judgment intact.  Okay for outpatient follow-up.

## 2021-01-31 NOTE — Assessment & Plan Note (Signed)
Discussed options.  Check plain films today.  She can ice her greater trochanteric area.  Increase Celebrex to 200 mg a day with routine cautions discussed with patient.  She is going to get LFTs done at the GI clinic and I asked her to send me a copy of those labs.  See after visit summary.  She agrees with plan.

## 2021-02-03 ENCOUNTER — Ambulatory Visit (INDEPENDENT_AMBULATORY_CARE_PROVIDER_SITE_OTHER): Payer: Medicare Other | Admitting: Psychology

## 2021-02-03 DIAGNOSIS — F419 Anxiety disorder, unspecified: Secondary | ICD-10-CM | POA: Diagnosis not present

## 2021-02-18 DIAGNOSIS — M25551 Pain in right hip: Secondary | ICD-10-CM | POA: Diagnosis not present

## 2021-02-25 ENCOUNTER — Ambulatory Visit: Payer: Medicare Other | Admitting: Family Medicine

## 2021-02-26 ENCOUNTER — Telehealth: Payer: Self-pay

## 2021-02-26 ENCOUNTER — Ambulatory Visit: Payer: Medicare Other | Admitting: Family Medicine

## 2021-02-26 DIAGNOSIS — Z0289 Encounter for other administrative examinations: Secondary | ICD-10-CM

## 2021-02-26 NOTE — Telephone Encounter (Signed)
[place] Night - Client Nonclinical Telephone Record AccessNurse Client [place] Night - Client Client Site [place] Physician Renford Dills - MD Contact Type Call Who Is Calling Patient / Member / Family / Caregiver Caller Name Jenison Phone Number [phone] Patient Name Allison Yoder Patient DOB [birth year] Call Type Message Only Information Provided Reason for Call Request to Reschedule Office Appointment Initial Comment Caller states she is needing to change/reschedule an appointment. Patient request to speak to RN No Disp. Time Disposition Final User 02/26/2021 8:03:17 AM General Information Provided Yes Domingo Dimes, [place] Call Closed By: Memory Argue Transaction Date/Time: 02/26/2021 8:01:40 AM (ET)

## 2021-03-01 ENCOUNTER — Other Ambulatory Visit (INDEPENDENT_AMBULATORY_CARE_PROVIDER_SITE_OTHER): Payer: Medicare Other

## 2021-03-01 ENCOUNTER — Other Ambulatory Visit: Payer: Self-pay

## 2021-03-01 ENCOUNTER — Encounter: Payer: Self-pay | Admitting: Family Medicine

## 2021-03-01 DIAGNOSIS — I10 Essential (primary) hypertension: Secondary | ICD-10-CM | POA: Diagnosis not present

## 2021-03-01 LAB — BASIC METABOLIC PANEL
BUN: 16 mg/dL (ref 6–23)
CO2: 29 mEq/L (ref 19–32)
Calcium: 9.6 mg/dL (ref 8.4–10.5)
Chloride: 98 mEq/L (ref 96–112)
Creatinine, Ser: 0.72 mg/dL (ref 0.40–1.20)
GFR: 84.6 mL/min (ref >60.00)
Glucose, Bld: 274 mg/dL — ABNORMAL HIGH (ref 70–99)
Potassium: 4.4 mEq/L (ref 3.5–5.1)
Sodium: 135 mEq/L (ref 135–145)

## 2021-03-02 DIAGNOSIS — M25551 Pain in right hip: Secondary | ICD-10-CM | POA: Diagnosis not present

## 2021-03-03 ENCOUNTER — Telehealth: Payer: Self-pay

## 2021-03-03 NOTE — Chronic Care Management (AMB) (Addendum)
Chronic Care Management Pharmacy Assistant    Name: Allison Yoder  MRN: [id number] DOB: [birth year]   Reason for Encounter: Disease State  DM HTN   Recent office visits:  01/28/21 - Dr.Duncan PCP - Increase Celebrex to 100 mg 2 capsules a day   Recent consult visits:  12/28/20 - Podiatry - No medication changes  Hospital visits:  None in previous 6 months  Medications: Outpatient Encounter Medications as of 03/03/2021  Medication Sig   albuterol (VENTOLIN HFA) 108 (90 Base) MCG/ACT inhaler INHALE 1 TO 2 PUFFS INTO THE LUNGS EVERY 6 HOURS AS NEEDED FOR WHEEZING OR SHORTNESS OF BREATH   Alirocumab (PRALUENT) 150 MG/ML SOAJ Inject 1 pen into the skin every 14 (fourteen) days.   Blood Glucose Monitoring Suppl (ONE TOUCH ULTRA 2) w/Device KIT Check blood sugar once daily and as instructed. Dx E11.9   celecoxib (CELEBREX) 100 MG capsule Take 2 capsules (200 mg total) by mouth daily.   citalopram (CELEXA) 20 MG tablet TAKE 1 TABLET BY MOUTH  DAILY   clonazePAM (KLONOPIN) 0.5 MG tablet Take 1 tablet (0.5 mg total) by mouth daily as needed for anxiety.   cyanocobalamin 1000 MCG tablet Take 1,000 mcg by mouth daily.   cyclobenzaprine (FLEXERIL) 10 MG tablet Take 1 tablet (10 mg total) by mouth daily as needed for muscle spasms.   fluticasone (FLONASE) 50 MCG/ACT nasal spray Place into both nostrils daily as needed for allergies or rhinitis.   glucose blood (ONE TOUCH ULTRA TEST) test strip Check blood sugar once daily and as instructed. Dx E11.9   lisinopril (ZESTRIL) 30 MG tablet Take 1 tablet (30 mg total) by mouth daily.   metFORMIN (GLUCOPHAGE) 500 MG tablet TAKE 2 TABLETS BY MOUTH TWICE DAILY WITH A MEAL   metoprolol succinate (TOPROL-XL) 50 MG 24 hr tablet TAKE 1 TABLET BY MOUTH  DAILY WITH OR IMMEDIATELY  FOLLOWING A MEAL   Multiple Vitamin (MULTIVITAMIN) tablet Take 1 tablet by mouth daily.   omeprazole (PRILOSEC) 40 MG capsule    ONETOUCH DELICA LANCETS 70W MISC Check blood sugar  once daily and as instructed. Dx E11.9   Vitamin D, Cholecalciferol, 1000 units CAPS Take 3,000 Units by mouth daily.   No facility-administered encounter medications on file as of 03/03/2021.   Recent Relevant Labs: Lab Results  Component Value Date/Time   HGBA1C 7.5 (A) 12/11/2020 04:12 PM   HGBA1C 8.8 (A) 07/20/2020 04:17 PM   HGBA1C 8.2 (H) 05/31/2019 09:28 AM   HGBA1C 7.6 (H) 04/10/2018 08:13 AM    Kidney Function Lab Results  Component Value Date/Time   CREATININE 0.72 03/01/2021 11:02 AM   CREATININE 0.75 05/31/2019 09:28 AM   CREATININE 0.73 11/13/2015 03:57 PM   GFR 84.60 03/01/2021 11:02 AM   GFRNONAA >60 12/01/2016 10:40 AM   GFRAA >60 12/01/2016 10:40 AM    Current antihyperglycemic regimen:  Metformin 500 mg - 2 tablets twice daily (2000 mg/day)    Patient verbally confirms she is taking the above medications as directed. Yes  What recent interventions/DTPs have been made to improve glycemic control: The patient has started a carbohydrate free diet   Have there been any recent hospitalizations or ED visits since last visit with CPP? No  Patient denies hypoglycemic symptoms, including Pale, Sweaty, Shaky, Hungry, Nervous/irritable, and Vision changes  Patient denies hyperglycemic symptoms, including blurry vision, excessive thirst, fatigue, polyuria, and weakness  How often are you checking your blood sugar? The patient reports she has not been  taking her BG at home. Asked her to resume monitoring daily.  What are your blood sugars ranging? On CMP, patients blood glucose was 274 and this was after 12 hours fasting. Patient is trying to improve diet.  During the week, how often does your blood glucose drop below 70? Never  Are you checking your feet daily/regularly? Yes  Adherence Review: Is the patient currently on a STATIN medication? No Is the patient currently on ACE/ARB medication? Yes Does the patient have >5 day gap between last estimated fill dates?  No  Star Rating Drugs: Medication:  Last Fill: Day Supply Lisinopril 57m 12/23/20 90 Metformin 5066m4/27/22 90   Recent Office Vitals: BP Readings from Last 3 Encounters:  01/28/21 128/82  12/11/20 130/68  08/11/20 (!) 161/91   Pulse Readings from Last 3 Encounters:  01/28/21 78  12/11/20 86  08/11/20 77    Wt Readings from Last 3 Encounters:  01/28/21 179 lb (81.2 kg)  12/16/20 176 lb (79.8 kg)  12/11/20 176 lb (79.8 kg)     Kidney Function Lab Results  Component Value Date/Time   CREATININE 0.72 03/01/2021 11:02 AM   CREATININE 0.75 05/31/2019 09:28 AM   CREATININE 0.73 11/13/2015 03:57 PM   GFR 84.60 03/01/2021 11:02 AM   GFRNONAA >60 12/01/2016 10:40 AM   GFRAA >60 12/01/2016 10:40 AM    BMP Latest Ref Rng & Units 03/01/2021 05/31/2019 04/10/2018  Glucose 70 - 99 mg/dL 274(H) 194(H) 198(H)  BUN 6 - 23 mg/dL _0 Creatinine 0.40 - 1.20 mg/dL 0.72 0.75 0.76  Sodium 135 - 145 mEq/L 135 139 140  Potassium 3.5 - 5.1 mEq/L 4.4 4.8 4.4  Chloride 96 - 112 mEq/L 98 102 104  CO2 19 - 32 mEq/L _1 Calcium 8.4 - 10.5 mg/dL 9.6 9.6 9.6   Current antihypertensive regimen:  Lisinopril  30 mg -  1 tablet daily  Metoprolol succinate 50 mg  - 1  tablet daily   Patient verbally confirms she is taking the above medications as directed. Yes  How often are you checking your Blood Pressure? infrequently  Current home BP readings:   DATE:             BP               PULSE 03/03/2021  160/86  96  Wrist or arm cuff: she has a wrist cuff  Caffeine intake: 1 cup regular coffee  each morning Salt intake: tries to limit adding to food OTC medications including pseudoephedrine or NSAIDs? Celebrex   Any readings above 180/120? No  What recent interventions/DTPs have been made by any provider to improve Blood Pressure control since last CPP Visit: No  Any recent hospitalizations or ED visits since last visit with CPP? No  What diet changes have been made to improve  Blood Pressure Control?   The patient is eating low carbohydrates  What exercise is being done to improve your Blood Pressure Control?  The patient continues to work and she is raising her 3 42ear old grandchild  Follow-Up:  Pharmacist Review  MiDebbora DusCPP notified  VeAvel SensorCCCrenshawssistant [phone] have reviewed the care management and care coordination activities outlined in this encounter and I am certifying that I agree with the content of this note. F/u next month for BG log.  MiDebbora DusPharmD Clinical Pharmacist [facility] [phone]

## 2021-03-08 DIAGNOSIS — K7689 Other specified diseases of liver: Secondary | ICD-10-CM | POA: Diagnosis not present

## 2021-03-11 ENCOUNTER — Encounter: Payer: Self-pay | Admitting: Family Medicine

## 2021-03-11 DIAGNOSIS — E119 Type 2 diabetes mellitus without complications: Secondary | ICD-10-CM

## 2021-03-16 NOTE — Progress Notes (Signed)
Cardiology Office Note   Date:  03/16/2021   ID:  Allison Yoder, DOB October 13, 1950, MRN [id number]  PCP:  Tonia Ghent, MD  Cardiologist:   Jenkins Rouge, MD   No chief complaint on file.     History of Present Illness:  70 y.o. chest pain 12/01/16.  Normal ETT 12/14/16 Calcium score elevated 172 on statin Also history of HTN and DM Had some atypical chest pain April 2022 f/u myovue 12/16/20 normal with no ischemia and EF 76%   Echo from 01/26/16 normal EF 65-70% done for TIA Carotid 5/17  Plaque RICA no stenosis   CVA 01/2016 lacunar with small area of acute infarct in left amygdala and tail of hippocampus   Working at [employer] She has a grand-baby in [place] named [place] She has partial custody of him as her son and his wife Are having issues with drug addiction spent a lot of time and money trying to get full custody   She is intolerant to crestor, lipitor, pravastatin and niacin  Started on Praluent July 2021 with excellent drop  In LDL 39 labs 05/27/20   Having significant right hip pain Plain films 01/29/21 with mild degenerative changes no acute process  ? Bone spur. BP running higher may be related to celebrex use      Past Medical History:  Diagnosis Date   Allergy    Anemia    "all the time as a child"   Anxiety    Arthritis    "all my joints; worse in my hands" (10/27/2015)   Chronic lower back pain    Depression    Diverticulosis    Fatty liver    GERD (gastroesophageal reflux disease)    Heart murmur    "dx'd years ago; very mild; never treated" (10/27/2015)   Hyperlipidemia    Hypertension    Hypothyroidism 2002-~ 2010   Iritis    per [facility]   Migraines    "stopped when I went thru menopause"   OSA on CPAP    Pneumonia ~ 2013; 10/27/2015   Recurrent urinary tract infection    Routine general medical examination at a health care facility 02/26/2014   Snores    Stroke ([place])    Type II diabetes mellitus ([place])    "lost weight; started  exercising again; don't have it anymore" (10/27/2015)   Urinary incontinence    Wears glasses     Past Surgical History:  Procedure Laterality Date   BLEPHAROPLASTY Bilateral 2013; 2016   BREAST LUMPECTOMY Right 1964   benign tumor,   HEMORRHOID BANDING  X 2   KNEE ARTHROSCOPY Left 2016   "meniscus repair"   PELVIC FLOOR REPAIR  2001   "lift"   POLYPECTOMY  X 3   "bladder"   SHOULDER ARTHROSCOPY W/ ROTATOR CUFF REPAIR Left 2013   SHOULDER ARTHROSCOPY W/ ROTATOR CUFF REPAIR Right 2015   TUBAL LIGATION  ~ [place]   Partial     Current Outpatient Medications  Medication Sig Dispense Refill   albuterol (VENTOLIN HFA) 108 (90 Base) MCG/ACT inhaler INHALE 1 TO 2 PUFFS INTO THE LUNGS EVERY 6 HOURS AS NEEDED FOR WHEEZING OR SHORTNESS OF BREATH 25.5 g 1   Alirocumab (PRALUENT) 150 MG/ML SOAJ Inject 1 pen into the skin every 14 (fourteen) days. 2 pen 11   Blood Glucose Monitoring Suppl (ONE TOUCH ULTRA 2) w/Device KIT Check blood sugar once daily and as instructed.  Dx E11.9 1 each 0   celecoxib (CELEBREX) 100 MG capsule Take 2 capsules (200 mg total) by mouth daily.     citalopram (CELEXA) 20 MG tablet TAKE 1 TABLET BY MOUTH  DAILY 90 tablet 3   clonazePAM (KLONOPIN) 0.5 MG tablet Take 1 tablet (0.5 mg total) by mouth daily as needed for anxiety. 30 tablet 1   cyanocobalamin 1000 MCG tablet Take 1,000 mcg by mouth daily.     cyclobenzaprine (FLEXERIL) 10 MG tablet Take 1 tablet (10 mg total) by mouth daily as needed for muscle spasms. 90 tablet 3   fluticasone (FLONASE) 50 MCG/ACT nasal spray Place into both nostrils daily as needed for allergies or rhinitis.     glucose blood (ONE TOUCH ULTRA TEST) test strip Check blood sugar once daily and as instructed. Dx E11.9 100 each 1   lisinopril (ZESTRIL) 30 MG tablet Take 1 tablet (30 mg total) by mouth daily. 90 tablet 3   metFORMIN (GLUCOPHAGE) 500 MG tablet TAKE 2 TABLETS BY MOUTH TWICE DAILY WITH A MEAL      metoprolol succinate (TOPROL-XL) 50 MG 24 hr tablet TAKE 1 TABLET BY MOUTH  DAILY WITH OR IMMEDIATELY  FOLLOWING A MEAL 90 tablet 3   Multiple Vitamin (MULTIVITAMIN) tablet Take 1 tablet by mouth daily.     omeprazole (PRILOSEC) 40 MG capsule      ONETOUCH DELICA LANCETS 44I MISC Check blood sugar once daily and as instructed. Dx E11.9 100 each 1   Vitamin D, Cholecalciferol, 1000 units CAPS Take 3,000 Units by mouth daily.     No current facility-administered medications for this visit.    Allergies:   Cephalexin, Codeine, Inapsine [droperidol], Crestor [rosuvastatin], Lipitor [atorvastatin calcium], Niacin and related, Pravastatin, Adhesive [tape], and Latex    Social History:  The patient  reports that she quit smoking about 35 years ago. Her smoking use included cigarettes. She has a 10.00 pack-year smoking history. She has never used smokeless tobacco. She reports current alcohol use of about 1.0 standard drink of alcohol per week. She reports that she does not use drugs.   Family History:  The patient's family history includes Alcohol abuse in her father; Arthritis in her maternal grandmother, mother, and sister; Breast cancer in her cousin; Dementia in her paternal grandmother; Depression in her mother; Diabetes in her maternal grandmother, mother, paternal uncle, and sister; Heart disease in her mother, paternal aunt, paternal aunt, paternal uncle, and paternal uncle; Hyperlipidemia in her mother; Hypertension in her mother; Lung cancer in her father; Stroke in her maternal grandmother, mother, and paternal aunt.    ROS:  Please see the history of present illness.   Otherwise, review of systems are positive for none.   All other systems are reviewed and negative.    PHYSICAL EXAM: VS:  There were no vitals taken for this visit. , BMI There is no height or weight on file to calculate BMI. Affect appropriate Healthy:  appears stated age 9: normal Neck supple with no adenopathy JVP  normal no bruits no thyromegaly Lungs clear with no wheezing and good diaphragmatic motion Heart:  S1/S2 no murmur, no rub, gallop or click PMI normal Abdomen: benighn, BS positve, no tenderness, no AAA no bruit.  No HSM or HJR Distal pulses intact with no bruits No edema Neuro non-focal Skin warm and dry No muscular weakness   EKG:  NSR normal ECG  11/20/17 4/14//21  SR rate 92 normal 03/24/2021 NSR rate 86 normal  Recent Labs: 12/08/2020: ALT 176 03/01/2021: BUN 16; Creatinine, Ser 0.72; Potassium 4.4; Sodium 135    Lipid Panel    Component Value Date/Time   CHOL 96 (L) 05/27/2020 0737   TRIG 133 05/27/2020 0737   HDL 39 (L) 05/27/2020 0737   CHOLHDL 2.5 05/27/2020 0737   CHOLHDL 3 05/31/2019 0928   VLDL 30.6 05/31/2019 0928   LDLCALC 34 05/27/2020 0737   LDLDIRECT 66.0 08/15/2017 0743      Wt Readings from Last 3 Encounters:  01/28/21 81.2 kg  12/16/20 79.8 kg  12/11/20 79.8 kg      Other studies Reviewed: Additional studies/ records that were reviewed today include: CXR, ECG labs and records ER visit 12/01/16.    ASSESSMENT AND PLAN:  1.  Chest Pain:  Normal ETT 12/14/16  and normal myovue with no ischemia 12/16/20 Calcium Score 172 86 th percentile 12/16/16 Calcium was noted In proximal RCA and LAD Continue medical Rx 2. Chol:  On Praluent since July 2021 nice drop in LDL 39 with normal LFTls  3. HTN:  Increase Lisinopril to 40 mg daily f/u primary may need addition of diuretic Decrease use of NSAI's  4. DM:  Discussed low carb diet.  Target hemoglobin A1c is 6.5 or less.  Continue current medications. 5. Depression on celexa f/u Dr Damita Dunnings  6. Stroke: lacunar on ASA carotids with no high grade lesion f/u neuro  7. Ortho:  right hip pain f/u Dr Damita Dunnings plain films not high risk consider US guided steroid injection    Current medicines are reviewed at length with the patient today.  The patient does not have concerns regarding medicines.  The following changes  have been made:  Lisninopril dose increase 40 mg Decrease Celebrex use 100 mg daily   Labs/ tests ordered today include:  None   No orders of the defined types were placed in this encounter.    Disposition:   FU with me in a year       Signed, Jenkins Rouge, MD  03/16/2021 4:27 PM    [facility] [address], [place], [place]  [address] Phone: [phone]; Fax: [phone]

## 2021-03-21 ENCOUNTER — Other Ambulatory Visit: Payer: Self-pay | Admitting: Family Medicine

## 2021-03-21 DIAGNOSIS — E119 Type 2 diabetes mellitus without complications: Secondary | ICD-10-CM

## 2021-03-23 ENCOUNTER — Ambulatory Visit: Payer: Medicare Other | Admitting: Family Medicine

## 2021-03-24 ENCOUNTER — Ambulatory Visit (INDEPENDENT_AMBULATORY_CARE_PROVIDER_SITE_OTHER): Payer: Medicare Other | Admitting: Cardiovascular Disease

## 2021-03-24 ENCOUNTER — Other Ambulatory Visit: Payer: Self-pay

## 2021-03-24 ENCOUNTER — Encounter: Payer: Self-pay | Admitting: Cardiovascular Disease

## 2021-03-24 VITALS — BP 150/70 | HR 86 | Ht 67.0 in | Wt 147.8 lb

## 2021-03-24 DIAGNOSIS — E782 Mixed hyperlipidemia: Secondary | ICD-10-CM | POA: Diagnosis not present

## 2021-03-24 DIAGNOSIS — I10 Essential (primary) hypertension: Secondary | ICD-10-CM | POA: Diagnosis not present

## 2021-03-24 MED ORDER — LISINOPRIL 40 MG PO TABS: 40.0000 mg | ORAL_TABLET | Freq: Every day | ORAL | 3 refills | Status: DC

## 2021-03-24 NOTE — Patient Instructions (Addendum)
Medication Instructions:  Your physician has recommended you make the following change in your medication:  1-INCREASE Lisinopril 40 mg by mouth daily.  *If you need a refill on your cardiac medications before your next appointment, please call your pharmacy*  Lab Work: If you have labs (blood work) drawn today and your tests are completely normal, you will receive your results only by: [place] (if you have MyChart) OR A paper copy in the mail If you have any lab test that is abnormal or we need to change your treatment, we will call you to review the results.  Testing/Procedures: None ordered today.  Follow-Up: At [facility], you and your health needs are our priority.  As part of our continuing mission to provide you with exceptional heart care, we have created designated Provider Care Teams.  These Care Teams include your primary Cardiologist (physician) and Advanced Practice Providers (APPs -  Physician Assistants and Nurse Practitioners) who all work together to provide you with the care you need, when you need it.  We recommend signing up for the patient portal called "MyChart".  Sign up information is provided on this After Visit Summary.  MyChart is used to connect with patients for Virtual Visits (Telemedicine).  Patients are able to view lab/test results, encounter notes, upcoming appointments, etc.  Non-urgent messages can be sent to your provider as well.   To learn more about what you can do with MyChart, go to [email or web address].    Your next appointment:   1 year(s)  The format for your next appointment:   In Person  Provider:   You may see Jenkins Rouge, MD or one of the following Advanced Practice Providers on your designated Care Team:   Cecilie Kicks, NP

## 2021-03-26 ENCOUNTER — Ambulatory Visit (INDEPENDENT_AMBULATORY_CARE_PROVIDER_SITE_OTHER): Payer: Medicare Other | Admitting: Family Medicine

## 2021-03-26 ENCOUNTER — Other Ambulatory Visit: Payer: Self-pay

## 2021-03-26 ENCOUNTER — Encounter: Payer: Self-pay | Admitting: Family Medicine

## 2021-03-26 VITALS — BP 138/78 | HR 82 | Temp 98.0°F | Ht 67.0 in | Wt 174.5 lb

## 2021-03-26 DIAGNOSIS — R7989 Other specified abnormal findings of blood chemistry: Secondary | ICD-10-CM

## 2021-03-26 DIAGNOSIS — E119 Type 2 diabetes mellitus without complications: Secondary | ICD-10-CM | POA: Diagnosis not present

## 2021-03-26 LAB — COMPREHENSIVE METABOLIC PANEL
AG Ratio: 1.7 (calc) (ref 1.0–2.5)
ALT: 137 U/L — ABNORMAL HIGH (ref 6–29)
AST: 97 U/L — ABNORMAL HIGH (ref 10–35)
Albumin: 4.3 g/dL (ref 3.6–5.1)
Alkaline phosphatase (APISO): 127 U/L (ref 37–153)
BUN: 19 mg/dL (ref 7–25)
CO2: 28 mmol/L (ref 20–32)
Calcium: 9.5 mg/dL (ref 8.6–10.4)
Chloride: 103 mmol/L (ref 98–110)
Creat: 0.78 mg/dL (ref 0.60–1.00)
Globulin: 2.5 g/dL (calc) (ref 1.9–3.7)
Glucose, Bld: 122 mg/dL — ABNORMAL HIGH (ref 65–99)
Potassium: 4.1 mmol/L (ref 3.5–5.3)
Sodium: 140 mmol/L (ref 135–146)
Total Bilirubin: 0.5 mg/dL (ref 0.2–1.2)
Total Protein: 6.8 g/dL (ref 6.1–8.1)

## 2021-03-26 LAB — POCT GLYCOSYLATED HEMOGLOBIN (HGB A1C): Hemoglobin A1C: 8.6 % — AB (ref 4.0–5.6)

## 2021-03-26 NOTE — Progress Notes (Signed)
This visit occurred during the SARS-CoV-2 public health emergency.  Safety protocols were in place, including screening questions prior to the visit, additional usage of staff PPE, and extensive cleaning of exam room while observing appropriate contact time as indicated for disinfecting solutions.  Diabetes:  Using medications without difficulties: yes, metformin 1000mg  BID. Hypoglycemic episodes: no Hyperglycemic episodes: see below.   Feet problems: no Blood Sugars averaging: see below.   eye exam within last year: yes A1c d/w pt.   Other labs pending.  She recently got a post meal sugar under 100.   She recently cut out a lot of sugars from her diet. We talked about continuing as is and rechecking in about 3 months.    She is still caring for her grandson Jaxson, d/w pt.  Her son has had troubles with substance which limits his ability to function as a parent.  She had to get a restraining order on her son prev.    HLD with f/u LFTs pending.  History of LFT elevation noted.  No abdominal pain.  No jaundice.  Meds, vitals, and allergies reviewed.   ROS: Per HPI unless specifically indicated in ROS section   GEN: nad, alert and oriented HEENT: ncat NECK: supple w/o LA CV: rrr. PULM: ctab, no inc wob ABD: soft, +bs EXT: no edema SKIN: no acute rash

## 2021-03-26 NOTE — Patient Instructions (Addendum)
Go to the lab on the way out.   If you have mychart we'll likely use that to update you.    Take care.  Glad to see you. Don't change your meds yet.  Plan on recheck in about 4 months at a yearly visit.

## 2021-03-27 ENCOUNTER — Other Ambulatory Visit: Payer: Self-pay | Admitting: Cardiovascular Disease

## 2021-03-28 ENCOUNTER — Other Ambulatory Visit: Payer: Self-pay | Admitting: Family Medicine

## 2021-03-28 DIAGNOSIS — R7989 Other specified abnormal findings of blood chemistry: Secondary | ICD-10-CM | POA: Insufficient documentation

## 2021-03-28 DIAGNOSIS — M899 Disorder of bone, unspecified: Secondary | ICD-10-CM

## 2021-03-28 DIAGNOSIS — E119 Type 2 diabetes mellitus without complications: Secondary | ICD-10-CM

## 2021-03-28 DIAGNOSIS — M8580 Other specified disorders of bone density and structure, unspecified site: Secondary | ICD-10-CM

## 2021-03-28 NOTE — Assessment & Plan Note (Signed)
Continue metformin A1c d/w pt.   Other labs pending.  She recently got a post meal sugar under 100.   She recently cut out a lot of sugars from her diet. We talked about continuing as is and rechecking in about 3 months.   Recheck A1c in a few months.  She may be able to get significant improvement with diet change.

## 2021-03-28 NOTE — Assessment & Plan Note (Signed)
No jaundice.  No abdominal pain.  Recheck LFTs pending.  Still on Praluent.  Not on statin.

## 2021-03-30 DIAGNOSIS — M25551 Pain in right hip: Secondary | ICD-10-CM | POA: Diagnosis not present

## 2021-04-01 ENCOUNTER — Telehealth: Payer: Self-pay | Admitting: Family Medicine

## 2021-04-01 ENCOUNTER — Encounter: Payer: Self-pay | Admitting: Family Medicine

## 2021-04-01 ENCOUNTER — Emergency Department (HOSPITAL_COMMUNITY): Payer: Medicare Other

## 2021-04-01 ENCOUNTER — Encounter (HOSPITAL_COMMUNITY): Payer: Self-pay | Admitting: *Deleted

## 2021-04-01 ENCOUNTER — Emergency Department (HOSPITAL_COMMUNITY)
Admission: EM | Admit: 2021-04-01 | Discharge: 2021-04-01 | Disposition: A | Discharge status: Home / Self Care | Payer: Medicare Other | Attending: Emergency Medicine | Admitting: Emergency Medicine

## 2021-04-01 DIAGNOSIS — Z79899 Other long term (current) drug therapy: Secondary | ICD-10-CM | POA: Insufficient documentation

## 2021-04-01 DIAGNOSIS — K5732 Diverticulitis of large intestine without perforation or abscess without bleeding: Secondary | ICD-10-CM | POA: Diagnosis not present

## 2021-04-01 DIAGNOSIS — I10 Essential (primary) hypertension: Secondary | ICD-10-CM | POA: Diagnosis not present

## 2021-04-01 DIAGNOSIS — K529 Noninfective gastroenteritis and colitis, unspecified: Secondary | ICD-10-CM | POA: Diagnosis not present

## 2021-04-01 DIAGNOSIS — K219 Gastro-esophageal reflux disease without esophagitis: Secondary | ICD-10-CM | POA: Insufficient documentation

## 2021-04-01 DIAGNOSIS — Z87891 Personal history of nicotine dependence: Secondary | ICD-10-CM | POA: Insufficient documentation

## 2021-04-01 DIAGNOSIS — E039 Hypothyroidism, unspecified: Secondary | ICD-10-CM | POA: Insufficient documentation

## 2021-04-01 DIAGNOSIS — E785 Hyperlipidemia, unspecified: Secondary | ICD-10-CM | POA: Insufficient documentation

## 2021-04-01 DIAGNOSIS — K6389 Other specified diseases of intestine: Secondary | ICD-10-CM | POA: Diagnosis not present

## 2021-04-01 DIAGNOSIS — R1032 Left lower quadrant pain: Secondary | ICD-10-CM | POA: Diagnosis present

## 2021-04-01 DIAGNOSIS — K5790 Diverticulosis of intestine, part unspecified, without perforation or abscess without bleeding: Secondary | ICD-10-CM | POA: Diagnosis not present

## 2021-04-01 DIAGNOSIS — E1169 Type 2 diabetes mellitus with other specified complication: Secondary | ICD-10-CM | POA: Diagnosis not present

## 2021-04-01 DIAGNOSIS — Z7984 Long term (current) use of oral hypoglycemic drugs: Secondary | ICD-10-CM | POA: Insufficient documentation

## 2021-04-01 DIAGNOSIS — Z91040 Latex allergy status: Secondary | ICD-10-CM | POA: Insufficient documentation

## 2021-04-01 DIAGNOSIS — K5792 Diverticulitis of intestine, part unspecified, without perforation or abscess without bleeding: Secondary | ICD-10-CM | POA: Diagnosis not present

## 2021-04-01 LAB — COMPREHENSIVE METABOLIC PANEL
ALT: 78 U/L — ABNORMAL HIGH (ref 0–44)
AST: 49 U/L — ABNORMAL HIGH (ref 15–41)
Albumin: 4 g/dL (ref 3.5–5.0)
Alkaline Phosphatase: 104 U/L (ref 38–126)
Anion gap: 10 (ref 5–15)
BUN: 13 mg/dL (ref 8–23)
CO2: 24 mmol/L (ref 22–32)
Calcium: 9.2 mg/dL (ref 8.9–10.3)
Chloride: 99 mmol/L (ref 98–111)
Creatinine, Ser: 0.62 mg/dL (ref 0.44–1.00)
GFR, Estimated: >60 mL/min (ref >60)
Glucose, Bld: 226 mg/dL — ABNORMAL HIGH (ref 70–99)
Potassium: 3.9 mmol/L (ref 3.5–5.1)
Sodium: 133 mmol/L — ABNORMAL LOW (ref 135–145)
Total Bilirubin: 1.3 mg/dL — ABNORMAL HIGH (ref 0.3–1.2)
Total Protein: 7.2 g/dL (ref 6.5–8.1)

## 2021-04-01 LAB — CBC
HCT: 41.7 % (ref 36.0–46.0)
Hemoglobin: 13.7 g/dL (ref 12.0–15.0)
MCH: 30.5 pg (ref 26.0–34.0)
MCHC: 32.9 g/dL (ref 30.0–36.0)
MCV: 92.9 fL (ref 80.0–100.0)
Platelets: 164 10*3/uL (ref 150–400)
RBC: 4.49 MIL/uL (ref 3.87–5.11)
RDW: 13.9 % (ref 11.5–15.5)
WBC: 13.8 10*3/uL — ABNORMAL HIGH (ref 4.0–10.5)
nRBC: 0 % (ref 0.0–0.2)

## 2021-04-01 LAB — URINALYSIS, ROUTINE W REFLEX MICROSCOPIC
Bilirubin Urine: NEGATIVE
Glucose, UA: 50 mg/dL — AB
Hgb urine dipstick: NEGATIVE
Ketones, ur: NEGATIVE mg/dL
Leukocytes,Ua: NEGATIVE
Nitrite: NEGATIVE
Protein, ur: NEGATIVE mg/dL
Specific Gravity, Urine: 1.012 (ref 1.005–1.030)
pH: 6 (ref 5.0–8.0)

## 2021-04-01 LAB — TYPE AND SCREEN
ABO/RH(D): O POS
Antibody Screen: NEGATIVE

## 2021-04-01 LAB — LIPASE, BLOOD: Lipase: 40 U/L (ref 11–51)

## 2021-04-01 MED ORDER — CIPROFLOXACIN HCL 500 MG PO TABS
500.0000 mg | ORAL_TABLET | Freq: Once | ORAL | Status: DC
  Filled 2021-04-01: qty 1

## 2021-04-01 MED ORDER — ONDANSETRON 4 MG PO TBDP
4.0000 mg | ORAL_TABLET | Freq: Once | ORAL | Status: AC
  Administered 2021-04-01: 4 mg via ORAL
  Filled 2021-04-01: qty 1

## 2021-04-01 MED ORDER — SODIUM CHLORIDE (PF) 0.9 % IJ SOLN
INTRAMUSCULAR | Status: AC
  Filled 2021-04-01: qty 50

## 2021-04-01 MED ORDER — IOHEXOL 350 MG/ML SOLN
100.0000 mL | Freq: Once | INTRAVENOUS | Status: AC | PRN
  Administered 2021-04-01: 80 mL via INTRAVENOUS

## 2021-04-01 MED ORDER — CIPROFLOXACIN HCL 500 MG PO TABS: 500.0000 mg | ORAL_TABLET | Freq: Two times a day (BID) | ORAL | 0 refills | Status: DC

## 2021-04-01 MED ORDER — METRONIDAZOLE 500 MG PO TABS: 500.0000 mg | ORAL_TABLET | Freq: Two times a day (BID) | ORAL | 0 refills | Status: DC

## 2021-04-01 MED ORDER — OXYCODONE-ACETAMINOPHEN 5-325 MG PO TABS: 1.0000 | ORAL_TABLET | Freq: Four times a day (QID) | ORAL | 0 refills | Status: DC | PRN

## 2021-04-01 MED ORDER — METRONIDAZOLE 500 MG PO TABS
500.0000 mg | ORAL_TABLET | Freq: Once | ORAL | Status: DC
  Filled 2021-04-01: qty 1

## 2021-04-01 NOTE — Telephone Encounter (Signed)
Please call patient/triage her about her abdominal pain.  See my chart message.  She already put a call into [facility].  They may be addressing this in the meantime.  My concern is that she has diverticulitis.  Thanks.

## 2021-04-01 NOTE — ED Provider Notes (Addendum)
[facility] DEPT Provider Note   CSN: [id number] Arrival date & time: 04/01/21  1031     History Chief Complaint  Patient presents with   Abdominal Pain    Allison Yoder is a 70 y.o. female.  Patient presents with abdominal pain.  Describes a sharp left-sided lower abdominal pain ongoing for a week.  She felt like it was worse in the last 2 days associate with fevers and chills yesterday.  Denies any vomiting or diarrhea.  Denies any chest pain or shortness of breath.      Past Medical History:  Diagnosis Date   Allergy    Anemia    "all the time as a child"   Anxiety    Arthritis    "all my joints; worse in my hands" (10/27/2015)   Chronic lower back pain    Depression    Diverticulosis    Fatty liver    GERD (gastroesophageal reflux disease)    Heart murmur    "dx'd years ago; very mild; never treated" (10/27/2015)   Hyperlipidemia    Hypertension    Hypothyroidism 2002-~ 2010   Iritis    per [facility]   Migraines    "stopped when I went thru menopause"   OSA on CPAP    Pneumonia ~ 2013; 10/27/2015   Recurrent urinary tract infection    Routine general medical examination at a health care facility 02/26/2014   Snores    Stroke ([place])    Type II diabetes mellitus ([place])    "lost weight; started exercising again; don't have it anymore" (10/27/2015)   Urinary incontinence    Wears glasses     Patient Active Problem List   Diagnosis Date Noted   LFT elevation 03/28/2021   Right hip pain 01/31/2021   Other social stressor 12/13/2020   Plantar wart 12/13/2020   Dysphagia 07/22/2020   Healthcare maintenance 07/22/2019   Cough 07/22/2019   Arthralgia 07/22/2019   Medicare annual wellness visit, initial 06/10/2018   Chest heaviness 06/10/2018   Osteopenia 09/10/2017   Fatty liver 05/19/2017   Diabetes mellitus ([place]) 05/05/2017   Cerebrovascular accident (CVA) due to thrombosis of left posterior cerebral artery ([place])  04/08/2016   Hypertensive disorder 10/27/2015   Advance care planning 04/29/2015   Obstructive sleep apnea of adult 07/25/2014   Palpitations 01/06/2013   Anxiety 09/01/2012   MDD (major depressive disorder) 12/19/2011   OA (osteoarthritis) 12/19/2011   Hyperlipidemia 12/19/2011   Diverticulitis 12/19/2011   Migraines 12/19/2011   Hypothyroid 12/19/2011   Iritis 12/19/2011    Past Surgical History:  Procedure Laterality Date   BLEPHAROPLASTY Bilateral 2013; 2016   BREAST LUMPECTOMY Right 1964   benign tumor,   HEMORRHOID BANDING  X 2   KNEE ARTHROSCOPY Left 2016   "meniscus repair"   PELVIC FLOOR REPAIR  2001   "lift"   POLYPECTOMY  X 3   "bladder"   SHOULDER ARTHROSCOPY W/ ROTATOR CUFF REPAIR Left 2013   SHOULDER ARTHROSCOPY W/ ROTATOR CUFF REPAIR Right 2015   TUBAL LIGATION  ~ [place]   Partial     OB History   No obstetric history on file.     Family History  Problem Relation Age of Onset   Arthritis Mother    Heart disease Mother    Hyperlipidemia Mother    Hypertension Mother    Diabetes Mother    Depression Mother    Stroke Mother  Alcohol abuse Father    Lung cancer Father    Arthritis Maternal Grandmother    Stroke Maternal Grandmother    Diabetes Maternal Grandmother    Heart disease Paternal Uncle        x 2   Arthritis Sister    Breast cancer Cousin    Heart disease Paternal Aunt    Diabetes Paternal Uncle    Diabetes Sister    Heart disease Paternal Uncle        x 5   Heart disease Paternal Aunt        x 3   Stroke Paternal Aunt    Dementia Paternal Grandmother    Colon cancer Neg Hx     Social History   Tobacco Use   Smoking status: Former    Packs/day: 2.00    Years: 5.00    Pack years: 10.00    Types: Cigarettes    Quit date: 09/12/1985    Years since quitting: 35.5   Smokeless tobacco: Never  Vaping Use   Vaping Use: Never used  Substance Use Topics   Alcohol use: Yes    Alcohol/week: 1.0  standard drink    Types: 1 Glasses of wine per week   Drug use: No    Home Medications Prior to Admission medications   Medication Sig Start Date End Date Taking? Authorizing Provider  ciprofloxacin (CIPRO) 500 MG tablet Take 1 tablet (500 mg total) by mouth every 12 (twelve) hours for 7 days. 04/01/21 04/08/21 Yes Luna Fuse, MD  metroNIDAZOLE (FLAGYL) 500 MG tablet Take 1 tablet (500 mg total) by mouth 2 (two) times daily. 04/01/21  Yes Luna Fuse, MD  albuterol (VENTOLIN HFA) 108 (90 Base) MCG/ACT inhaler INHALE 1 TO 2 PUFFS INTO THE LUNGS EVERY 6 HOURS AS NEEDED FOR WHEEZING OR SHORTNESS OF BREATH 07/29/20   Tonia Ghent, MD  Alirocumab (PRALUENT) 150 MG/ML SOAJ INJECT 1 PEN INTO THE SKIN EVERY 14 DAYS 03/29/21   Josue Hector, MD  Blood Glucose Monitoring Suppl (ONE TOUCH ULTRA 2) w/Device KIT Check blood sugar once daily and as instructed. Dx E11.9 12/29/16   Tonia Ghent, MD  celecoxib (CELEBREX) 100 MG capsule Take 2 capsules (200 mg total) by mouth daily. Patient taking differently: Take 100 mg by mouth daily. 01/28/21   Tonia Ghent, MD  citalopram (CELEXA) 20 MG tablet TAKE 1 TABLET BY MOUTH  DAILY 11/25/20   Tonia Ghent, MD  clonazePAM (KLONOPIN) 0.5 MG tablet Take 1 tablet (0.5 mg total) by mouth daily as needed for anxiety. 01/31/21   Tonia Ghent, MD  cyanocobalamin 1000 MCG tablet Take 1,000 mcg by mouth daily.    [provider]  cyclobenzaprine (FLEXERIL) 10 MG tablet Take 1 tablet (10 mg total) by mouth daily as needed for muscle spasms. 09/03/19   Tonia Ghent, MD  fluticasone Asencion Islam) 50 MCG/ACT nasal spray Place into both nostrils daily as needed for allergies or rhinitis.    [provider]  glucose blood (ONE TOUCH ULTRA TEST) test strip Check blood sugar once daily and as instructed. Dx E11.9 05/31/19   Tonia Ghent, MD  lisinopril (ZESTRIL) 40 MG tablet Take 1 tablet (40 mg total) by mouth daily. 03/24/21   Josue Hector, MD  metFORMIN (GLUCOPHAGE) 500 MG tablet TAKE 2 TABLETS BY MOUTH TWICE DAILY WITH A MEAL 12/11/20   Tonia Ghent, MD  metoprolol succinate (TOPROL-XL) 50 MG 24 hr tablet TAKE  1 TABLET BY MOUTH  DAILY WITH OR IMMEDIATELY  FOLLOWING A MEAL 09/18/20   Tonia Ghent, MD  Multiple Vitamin (MULTIVITAMIN) tablet Take 1 tablet by mouth daily.    [provider]  omeprazole (PRILOSEC) 40 MG capsule Take 40 mg by mouth as needed. 10/02/20   [provider]  Jonetta Speak LANCETS 16X MISC Check blood sugar once daily and as instructed. Dx E11.9 12/29/16   Tonia Ghent, MD  Vitamin D, Cholecalciferol, 1000 units CAPS Take 3,000 Units by mouth daily.    [provider]    Allergies    Cephalexin, Codeine, Inapsine [droperidol], Crestor [rosuvastatin], Lipitor [atorvastatin calcium], Niacin and related, Pravastatin, Adhesive [tape], and Latex  Review of Systems   Review of Systems  Constitutional:  Negative for fever.  HENT:  Negative for ear pain.   Eyes:  Negative for pain.  Respiratory:  Negative for cough.   Cardiovascular:  Negative for chest pain.  Gastrointestinal:  Positive for abdominal pain.  Genitourinary:  Negative for flank pain.  Musculoskeletal:  Negative for back pain.  Skin:  Negative for rash.  Neurological:  Negative for headaches.   Physical Exam Updated Vital Signs BP 139/68   Pulse 97   Temp 98.3 F (36.8 C) (Oral)   Resp 16   SpO2 91%   Physical Exam Constitutional:      General: She is not in acute distress.    Appearance: Normal appearance.  HENT:     Head: Normocephalic.     Nose: Nose normal.  Eyes:     Extraocular Movements: Extraocular movements intact.  Cardiovascular:     Rate and Rhythm: Normal rate.  Pulmonary:     Effort: Pulmonary effort is normal.  Abdominal:     Comments: Tenderness palpation the left lower quadrant.  Musculoskeletal:        General: Normal range of motion.     Cervical back: Normal range  of motion.  Neurological:     General: No focal deficit present.     Mental Status: She is alert. Mental status is at baseline.    ED Results / Procedures / Treatments   Labs (all labs ordered are listed, but only abnormal results are displayed) Labs Reviewed  COMPREHENSIVE METABOLIC PANEL - Abnormal; Notable for the following components:      Result Value   Sodium 133 (*)    Glucose, Bld 226 (*)    AST 49 (*)    ALT 78 (*)    Total Bilirubin 1.3 (*)    All other components within normal limits  CBC - Abnormal; Notable for the following components:   WBC 13.8 (*)    All other components within normal limits  URINALYSIS, ROUTINE W REFLEX MICROSCOPIC - Abnormal; Notable for the following components:   Glucose, UA 50 (*)    All other components within normal limits  LIPASE, BLOOD  OCCULT BLOOD X 1 CARD TO LAB, STOOL  TYPE AND SCREEN  ABO/RH    EKG None  Radiology CT ABDOMEN PELVIS W CONTRAST  Result Date: 04/01/2021 CLINICAL DATA:  Diverticulitis suspected.  LEFT lower quadrant pain EXAM: CT ABDOMEN AND PELVIS WITH CONTRAST TECHNIQUE: Multidetector CT imaging of the abdomen and pelvis was performed using the standard protocol following bolus administration of intravenous contrast. CONTRAST:  7/18 18 COMPARISON:  None. FINDINGS: Lower chest: Lung bases are clear. Hepatobiliary: No focal hepatic lesion. No biliary duct dilatation. Common bile duct is normal. Pancreas: Pancreas is normal. No  ductal dilatation. No pancreatic inflammation. Spleen: Normal spleen Adrenals/urinary tract: Adrenal glands and kidneys are normal. The ureters and bladder normal. Stomach/Bowel: Stomach, small bowel, appendix, and cecum are normal. At the junction of the descending colon and proximal sigmoid colon, there is robust inflammatory reaction within the pericolonic fat (image 56/2 through image 60/2). There are multiple diverticula through this regio. There is mild thickening along the LEFT pericolic  gutter . Findings consistent acute diverticulitis. No perforation or abscess identified. More distal sigmoid colon rectum normal Vascular/Lymphatic: Abdominal aorta is normal caliber with atherosclerotic calcification. There is no retroperitoneal or periportal lymphadenopathy. No pelvic lymphadenopathy. Reproductive: Post hysterectomy.  Adnexa unremarkable Other: No intraperitoneal free air. Small free fluid in the deep RIGHT pelvis. Musculoskeletal: No aggressive osseous lesion. IMPRESSION: 1. Acute diverticulitis of the distal descending colon. No macro perforation or abscess. Fairly robust inflammatory reaction. 2. No abscess or free air. Small amount free fluid in the RIGHT quadrant. 3.  Aortic Atherosclerosis (ICD10-I70.0). Electronically Signed   By: Suzy Bouchard M.D.   On: 04/01/2021 15:21    Procedures Procedures   Medications Ordered in ED Medications  sodium chloride (PF) 0.9 % injection (has no administration in time range)  ciprofloxacin (CIPRO) tablet 500 mg (has no administration in time range)  metroNIDAZOLE (FLAGYL) tablet 500 mg (has no administration in time range)  ondansetron (ZOFRAN-ODT) disintegrating tablet 4 mg (4 mg Oral Given 04/01/21 1400)  iohexol (OMNIPAQUE) 350 MG/ML injection 100 mL (80 mLs Intravenous Contrast Given 04/01/21 1438)    ED Course  I have reviewed the triage vital signs and the nursing notes.  Pertinent labs & imaging results that were available during my care of the patient were reviewed by me and considered in my medical decision making (see chart for details).    MDM Rules/Calculators/A&P                           Labs show mild elevated white count.  Chemistry remarkable.  CT abdomen pelvis pursued showing acute diverticulitis.  Patient given prescription of Augmentin for home.  She declined additional pain medications.  Advise follow-up with her GI doctor within the week.  Advised immediate return for persistent fevers worsening pain or  any additional concerns.  Advise liquid diet for the next 48 hours with gradual advancement.  Addendum: I initially wrote the patient for Cipro and Flagyl, however she states that her doctor already wrote her for Augmentin and she prefers to fill that prescription instead.  Final Clinical Impression(s) / ED Diagnoses Final diagnoses:  Diverticulitis    Rx / DC Orders ED Discharge Orders          Ordered    ciprofloxacin (CIPRO) 500 MG tablet  Every 12 hours        04/01/21 1532    metroNIDAZOLE (FLAGYL) 500 MG tablet  2 times daily        04/01/21 1532             [place], MD 04/01/21 1533    Luna Fuse, MD 04/01/21 1540

## 2021-04-01 NOTE — ED Triage Notes (Signed)
Pt complains of LLQ abd pain x 1 week, fever chills since yesterday. Concerned for diverticulitis, hx of same. Last took tylenol 1 hour prior to arrival. Noticed blood in stool last night.

## 2021-04-01 NOTE — Telephone Encounter (Signed)
I spoke with pt; pt has not heard from GI yet; pt said she went to work but is on her way home now. Pt said has been eating tomatoes recently but tomatoes do not usually bother her. Pt said this morning bright red blood with normal BM that turned to slightly loose BM at the end. A lot of blood on tissue and blood in commode water also. Pt has fever 99, nagging H/A with 4 - 5 pain level;chills, dry cough and SOB all the time. Lower abd pain being still is 6 - 7 and if moving around pain level is 8. The abd pain is a dull constant ache and the driving of the car vibration or movement makes the abd pain worse. Pt also having body aches but no vomiting. Pt had 2 covid vaccines and one booster. Dr Damita Dunnings agreed pt needs to go to ED now. Pt declines 911 and she is at home now and will have her niece take her to [facility] ED now. Sending note to Dr Damita Dunnings.

## 2021-04-01 NOTE — Discharge Instructions (Addendum)
Call your primary care doctor or specialist as discussed in the next 2-3 days.   Return immediately back to the ER if:  Your symptoms worsen within the next 12-24 hours. You develop new symptoms such as new fevers, persistent vomiting, new pain, shortness of breath, or new weakness or numbness, or if you have any other concerns.  

## 2021-04-01 NOTE — ED Provider Notes (Signed)
Emergency Medicine Provider Triage Evaluation Note  Allison Yoder , a 70 y.o. female  was evaluated in triage.  Pt complains of left lower quadrant abdominal pain for 1 week, fever and chills since last night.  Took some Tylenol, also took some amoxicillin without improvement in symptoms.  Had a bowel movement which contained blood, blood on the toilet along with bright red blood on the water commode.  Followed by Dr. Watt Climes of GI, seen last a few months ago. Appt with her in August. Reports pain all over.   Review of Systems  Positive: Abdominal pain, fever, blood in stool, nausea Negative: Chest pain, vomiting  Physical Exam  BP 138/86 (BP Location: Left Arm)   Pulse (!) 103   Temp 98.3 F (36.8 C) (Oral)   Resp 18   SpO2 96%  Gen:   Awake, no distress   Resp:  Normal effort  MSK:   Moves extremities without difficulty  Other:    Medical Decision Making  Medically screening exam initiated at 10:46 AM.  Appropriate orders placed.  [place] was informed that the remainder of the evaluation will be completed by another provider, this initial triage assessment does not replace that evaluation, and the importance of remaining in the ED until their evaluation is complete.  Labs along with CT ordered for recurrent diverticulitis.    Janeece Fitting, PA-C 04/01/21 1050    Luna Fuse, MD 04/01/21 1524

## 2021-04-01 NOTE — Telephone Encounter (Signed)
Agree, will await ER notes.  Thanks.

## 2021-04-02 ENCOUNTER — Other Ambulatory Visit: Payer: Self-pay | Admitting: Family Medicine

## 2021-04-02 DIAGNOSIS — R7989 Other specified abnormal findings of blood chemistry: Secondary | ICD-10-CM

## 2021-04-06 ENCOUNTER — Encounter: Payer: Self-pay | Admitting: Family Medicine

## 2021-04-07 ENCOUNTER — Other Ambulatory Visit: Payer: Self-pay | Admitting: Family Medicine

## 2021-04-07 MED ORDER — BENZONATATE 200 MG PO CAPS: 200.0000 mg | ORAL_CAPSULE | Freq: Three times a day (TID) | ORAL | 1 refills | Status: DC | PRN

## 2021-04-07 NOTE — Telephone Encounter (Signed)
LVM, need to triage pt.

## 2021-04-07 NOTE — Telephone Encounter (Signed)
Contacted pt who reports she has had a non-productive cough since leaving the hospital on 7/21. She also c/o a little sore throat but denies all other symptoms. Using alka-setzer Plus with relief and reports the cough is getting better than it was but requested an apt with PCP only. She also reports diverticulitis is also getting better slowly. Advised no apts with PCP until 7/29. Scheduled pt for 7/29 in office and advised if PCP wants to change the apt to [facility] this office will f/u. Advised if any symptoms worsen or she develops any new symptoms to contact the office. Advised of ER precautions. Pt verbalized understanding.

## 2021-04-07 NOTE — Telephone Encounter (Signed)
Pt was contacted by phone and documentation was in the other mychart msg dated today.

## 2021-04-09 ENCOUNTER — Other Ambulatory Visit: Payer: Self-pay

## 2021-04-09 ENCOUNTER — Encounter: Payer: Self-pay | Admitting: Family Medicine

## 2021-04-09 ENCOUNTER — Ambulatory Visit (INDEPENDENT_AMBULATORY_CARE_PROVIDER_SITE_OTHER): Payer: Medicare Other | Admitting: Family Medicine

## 2021-04-09 ENCOUNTER — Telehealth: Payer: Self-pay

## 2021-04-09 VITALS — BP 120/70 | HR 84 | Temp 97.2°F | Ht 67.0 in | Wt 171.0 lb

## 2021-04-09 DIAGNOSIS — K5792 Diverticulitis of intestine, part unspecified, without perforation or abscess without bleeding: Secondary | ICD-10-CM

## 2021-04-09 DIAGNOSIS — R059 Cough, unspecified: Secondary | ICD-10-CM

## 2021-04-09 DIAGNOSIS — R7989 Other specified abnormal findings of blood chemistry: Secondary | ICD-10-CM | POA: Diagnosis not present

## 2021-04-09 MED ORDER — FLUTICASONE PROPIONATE HFA 110 MCG/ACT IN AERO: 1.0000 | INHALATION_SPRAY | Freq: Two times a day (BID) | RESPIRATORY_TRACT | 12 refills | Status: DC

## 2021-04-09 NOTE — Progress Notes (Signed)
This visit occurred during the SARS-CoV-2 public health emergency.  Safety protocols were in place, including screening questions prior to the visit, additional usage of staff PPE, and extensive cleaning of exam room while observing appropriate contact time as indicated for disinfecting solutions.  F/u for diverticulitis and cough.  Covid neg.  Finishing augmentin soon.  Abd pain is better, almost no pain today.  Clearly better overall.  Minimally ttp LLQ.  No vomiting, no diarrhea.  Mildly loose stools.  Had been on clear liquid diet.  Had a sweet potato yesterday and did well.    D/w pt about LFTs.  I would hold Praluent for 1 month and then recheck labs after that.  I put in the follow-up order.  She most recently used med yesterday.   Still with nonproductive cough.  "Today it is breaking up" but still no sputum.  No fevers.  Albuterol helps.  Had been having episodes with cough over the last few months.  Grandson had runny nose recently, had had sick exposures episodically.  Mild wheeze.  No fevers.  Cough is better today.    Meds, vitals, and allergies reviewed.   ROS: Per HPI unless specifically indicated in ROS section   GEN: nad, alert and oriented HEENT: ncat NECK: supple w/o LA CV: rrr.   PULM: ctab except for mild B exp wheeze, no inc wob ABD: soft, +bs, no rebound, LLQ not ttp.   EXT: no edema SKIN: Well-perfused.

## 2021-04-09 NOTE — Chronic Care Management (AMB) (Addendum)
Chronic Care Management Pharmacy Assistant   Name: Allison Yoder  MRN: [id number] DOB: Nov 09, 1950  Reason for Encounter: Diabetes  Recent office visits:  03/26/21 - PCP - Patient presented for diabetes. A1c increased 8.6%. She is working on diet. Recent reading post-prandial 100. No medication changes. Follow up 4 months.  Recent consult visits:  03/24/21 - Cardiology - Patient presented for hyperlipidemia and chest pain. Increased lisinopil to 40 mg take 1 tablet daily. Decrease celebrex use 150m take 1 tablet daily.  Hospital visits:  [facility] - patient presented for abdominal pain given short course of oxycodone. Wrote the patient for Cipro and Flagyl. However, she states that her doctor already wrote her for Augmentin and she prefers to fill that prescription instead. No admission.   Medications: Outpatient Encounter Medications as of 04/09/2021  Medication Sig   albuterol (VENTOLIN HFA) 108 (90 Base) MCG/ACT inhaler INHALE 1 TO 2 PUFFS INTO THE LUNGS EVERY 6 HOURS AS NEEDED FOR WHEEZING OR SHORTNESS OF BREATH   Alirocumab (PRALUENT) 150 MG/ML SOAJ INJECT 1 PEN INTO THE SKIN EVERY 14 DAYS   benzonatate (TESSALON) 200 MG capsule Take 1 capsule (200 mg total) by mouth 3 (three) times daily as needed.   Blood Glucose Monitoring Suppl (ONE TOUCH ULTRA 2) w/Device KIT Check blood sugar once daily and as instructed. Dx E11.9   celecoxib (CELEBREX) 100 MG capsule Take 2 capsules (200 mg total) by mouth daily. (Patient taking differently: Take 100 mg by mouth daily.)   citalopram (CELEXA) 20 MG tablet TAKE 1 TABLET BY MOUTH  DAILY   clonazePAM (KLONOPIN) 0.5 MG tablet Take 1 tablet (0.5 mg total) by mouth daily as needed for anxiety.   cyanocobalamin 1000 MCG tablet Take 1,000 mcg by mouth daily.   cyclobenzaprine (FLEXERIL) 10 MG tablet Take 1 tablet (10 mg total) by mouth daily as needed for muscle spasms.   fluticasone (FLONASE) 50 MCG/ACT nasal spray Place  into both nostrils daily as needed for allergies or rhinitis.   glucose blood (ONE TOUCH ULTRA TEST) test strip Check blood sugar once daily and as instructed. Dx E11.9   lisinopril (ZESTRIL) 40 MG tablet Take 1 tablet (40 mg total) by mouth daily.   metFORMIN (GLUCOPHAGE) 500 MG tablet TAKE 2 TABLETS BY MOUTH TWICE DAILY WITH A MEAL   metoprolol succinate (TOPROL-XL) 50 MG 24 hr tablet TAKE 1 TABLET BY MOUTH  DAILY WITH OR IMMEDIATELY  FOLLOWING A MEAL   Multiple Vitamin (MULTIVITAMIN) tablet Take 1 tablet by mouth daily.   omeprazole (PRILOSEC) 40 MG capsule Take 40 mg by mouth as needed.   ONETOUCH DELICA LANCETS 346KMISC Check blood sugar once daily and as instructed. Dx E11.9   oxyCODONE-acetaminophen (PERCOCET/ROXICET) 5-325 MG tablet Take 1 tablet by mouth every 6 (six) hours as needed for severe pain.   Vitamin D, Cholecalciferol, 1000 units CAPS Take 3,000 Units by mouth daily.   No facility-administered encounter medications on file as of 04/09/2021.   Recent Relevant Labs: Lab Results  Component Value Date/Time   HGBA1C 8.6 (A) 03/26/2021 03:54 PM   HGBA1C 7.5 (A) 12/11/2020 04:12 PM   HGBA1C 8.2 (H) 05/31/2019 09:28 AM   HGBA1C 7.6 (H) 04/10/2018 08:13 AM    Kidney Function Lab Results  Component Value Date/Time   CREATININE 0.62 04/01/2021 11:04 AM   CREATININE 0.78 03/26/2021 04:30 PM   CREATININE 0.72 03/01/2021 11:02 AM   CREATININE 0.73 11/13/2015 03:57 PM   GFR  84.60 03/01/2021 11:02 AM   GFRNONAA >60 04/01/2021 11:04 AM   GFRAA >60 12/01/2016 10:40 AM     Current antihyperglycemic regimen:   Metformin 500 mg - 2 tablets twice daily (2000 mg/day)    Patient verbally confirms she is taking the above medications as directed: Yes  What recent interventions/DTPs have been made to improve glycemic control: Per PCP, patient working on dietary control  Have there been any recent hospitalizations or ED visits since last visit with CPP? Yes 04/01/21- [facility] for  abdominal pain chest pain short course of oxycodone and antibiotics no admission  Patient denies hypoglycemic symptoms, including Pale, Sweaty, Shaky, Hungry, Nervous/irritable, and Vision changes  Patient denies hyperglycemic symptoms, including blurry vision, excessive thirst, fatigue, polyuria, and weakness  How often are you checking your blood sugar? The patient reports she has been in the bed over a week with divertivulitis and has been on a liquid diet and on antibiotics. She has not checked her blood sugars for a few weeks now.  On insulin? No  During the week, how often does your blood glucose drop below 70? No  Are you checking your feet daily/regularly? Yes  Adherence Review: Is the patient currently on a STATIN medication? No Is the patient currently on ACE/ARB medication? Yes Does the patient have >5 day gap between last estimated fill dates? No  Star Rating Drugs:  Medication:  Last Fill: Day Supply Lisinopril 16m 03/24/21 90 Metformin 5030m7/7/22  90  Follow-Up:  Pharmacist Review  MiDebbora DusCPP notified  VeAvel SensorCProvidence St. Peter Hospitallinical Pharmacy Assistant [phone] have reviewed the care management and care coordination activities outlined in this encounter and I am certifying that I agree with the content of this note. No further action required.  MiDebbora DusPharmD Clinical Pharmacist [phone]

## 2021-04-09 NOTE — Patient Instructions (Addendum)
I would hold Praluent for 1 month and then recheck labs after that.  I put in the follow-up order.   Gradually return to regular diet as long as pain allows.  Finish augmentin.   Would check CXR in about 2-3 weeks.   Update me as needed.  Add on flovent twice a day.  Rinse after use.

## 2021-04-11 ENCOUNTER — Encounter: Payer: Self-pay | Admitting: Family Medicine

## 2021-04-11 NOTE — Assessment & Plan Note (Signed)
Reasonable to try adding on Flovent 1 puff twice a day with routine cautions.  Rinse after use.  Use albuterol if needed.  This may just be a postinfectious cough but given her overall situation with a mild expiratory wheeze it makes sense to go ahead and treat.  She agrees with plan.

## 2021-04-11 NOTE — Assessment & Plan Note (Signed)
Clinically she has significantly improved.  She gradually advance her diet and update me as needed.  She agrees with plan.

## 2021-04-11 NOTE — Assessment & Plan Note (Signed)
She will hold Praluent for 2 doses and then recheck LFTs.

## 2021-04-12 ENCOUNTER — Other Ambulatory Visit: Payer: Self-pay | Admitting: Family Medicine

## 2021-04-12 MED ORDER — FLUTICASONE PROPIONATE HFA 110 MCG/ACT IN AERO: 1.0000 | INHALATION_SPRAY | Freq: Two times a day (BID) | RESPIRATORY_TRACT | 12 refills | Status: DC

## 2021-04-12 NOTE — Telephone Encounter (Signed)
Please make sure pharmacy has rx for flovent.  I resent it today.  Thanks.

## 2021-04-12 NOTE — Telephone Encounter (Signed)
Flovent requires PA.

## 2021-04-14 DIAGNOSIS — K7689 Other specified diseases of liver: Secondary | ICD-10-CM | POA: Diagnosis not present

## 2021-04-14 DIAGNOSIS — K5732 Diverticulitis of large intestine without perforation or abscess without bleeding: Secondary | ICD-10-CM | POA: Diagnosis not present

## 2021-04-14 MED ORDER — ADVAIR HFA 115-21 MCG/ACT IN AERO: 2.0000 | INHALATION_SPRAY | Freq: Two times a day (BID) | RESPIRATORY_TRACT | 12 refills | Status: DC

## 2021-04-14 NOTE — Telephone Encounter (Signed)
Sent. Thanks.  Note sent to patient.

## 2021-04-14 NOTE — Telephone Encounter (Signed)
Patient call in stated her insurance will cover Advairhfa 115 mcg inhaler . Would like it to be call in     [place], [address] AT [place]

## 2021-04-28 DIAGNOSIS — Z6827 Body mass index (BMI) 27.0-27.9, adult: Secondary | ICD-10-CM | POA: Diagnosis not present

## 2021-04-28 DIAGNOSIS — Z7689 Persons encountering health services in other specified circumstances: Secondary | ICD-10-CM | POA: Diagnosis not present

## 2021-04-28 DIAGNOSIS — R87610 Atypical squamous cells of undetermined significance on cytologic smear of cervix (ASC-US): Secondary | ICD-10-CM | POA: Diagnosis not present

## 2021-04-28 DIAGNOSIS — Z1151 Encounter for screening for human papillomavirus (HPV): Secondary | ICD-10-CM | POA: Diagnosis not present

## 2021-04-28 DIAGNOSIS — M8580 Other specified disorders of bone density and structure, unspecified site: Secondary | ICD-10-CM | POA: Diagnosis not present

## 2021-04-28 DIAGNOSIS — Z1231 Encounter for screening mammogram for malignant neoplasm of breast: Secondary | ICD-10-CM | POA: Diagnosis not present

## 2021-04-28 DIAGNOSIS — N952 Postmenopausal atrophic vaginitis: Secondary | ICD-10-CM | POA: Diagnosis not present

## 2021-04-28 DIAGNOSIS — Z779 Other contact with and (suspected) exposures hazardous to health: Secondary | ICD-10-CM | POA: Diagnosis not present

## 2021-04-30 DIAGNOSIS — M25551 Pain in right hip: Secondary | ICD-10-CM | POA: Diagnosis not present

## 2021-05-03 ENCOUNTER — Encounter: Payer: Self-pay | Admitting: Registered"

## 2021-05-03 ENCOUNTER — Encounter: Payer: Medicare Other | Attending: Family Medicine | Admitting: Registered"

## 2021-05-03 ENCOUNTER — Other Ambulatory Visit: Payer: Self-pay

## 2021-05-03 DIAGNOSIS — E1165 Type 2 diabetes mellitus with hyperglycemia: Secondary | ICD-10-CM | POA: Insufficient documentation

## 2021-05-03 NOTE — Progress Notes (Signed)
Diabetes Self-Management Education  Visit Type: First/Initial  Appt. Start Time: 1610 Appt. End Time: 1720  05/04/2021  [name], identified by name and date of birth, is a 70 y.o. female with a diagnosis of Diabetes: Type 2.   ASSESSMENT  There were no vitals taken for this visit. There is no height or weight on file to calculate BMI.  A1c 8.6% on 03/26/21 up from 7.5% on 12/11/20  Metformin 1000 mg bid, loose stools.   Sleep: 6-7 hours of sleep Stress: 9/10 for the last 3 1/2 yrs  Patient states she hasn't changed how she is eating since her last A1c and is not clear why her blood sugar has gone up. Pt states she has cut out desserts, but likes starchy foods such as potatoes.  Pt states she wore a continuous glucose monitor for a while and it seemed to help her keep her blood sugar down. Pt reports a fasting blood sugar reading of 226 mg/dL on July 21. Does not check often because she has 2 machines but doesn't have the right supplies for the machine she can find.  Patient states she has a lot of stress in her life since he started raising her grandson who is now 58 1/2 yrs old. Pt states he is a joy, but a lot of work.  Pt reports Diverticulosis 2 months ago and was on a liquid diet. Pt states she is eating a bland diet  Pt reports she enjoys fruits such as apples, grapes, pineapple, melon with cottage cheese, tropical fruit in unsweetened syrup with cottage cheese.  Patient states she would be interested in the support group, but it is at the time when she is getting her grandson ready for bed.   Diabetes Self-Management Education - 05/03/21 1619       Visit Information   Visit Type First/Initial      Initial Visit   Diabetes Type Type 2    Are you currently following a meal plan? No    Are you taking your medications as prescribed? Yes    Date Diagnosed several years ago      Health Coping   How would you rate your overall health? Fair      Psychosocial  Assessment   Patient Belief/Attitude about Diabetes Defeat/Burnout    How often do you need to have someone help you when you read instructions, pamphlets, or other written materials from your doctor or pharmacy? 1 - Never    What is the last grade level you completed in school? Bachelor Degree      Complications   Last HgB A1C per patient/outside source 8.6 %    How often do you check your blood sugar? 0 times/day (not testing)    Have you had a dilated eye exam in the past 12 months? Yes    Have you had a dental exam in the past 12 months? Yes    Are you checking your feet? Yes    How many days per week are you checking your feet? 4      Dietary Intake   Breakfast coffee, sugar-free creamer, belvita breakfast bars    Snack (morning) 2 boiled eggs    Lunch french onion soup with croutons, water    Snack (afternoon) carrots, broccoli dipped in veggie dip    Dinner cream of tomato soup, croutons, 1/2 c lactaid fat free milk    Snack (evening) if starving milk peanut butter and jelly or Belvita OR [place]  with peach    Beverage(s) water, coffee, milk, occasionaly diet arnold palmer with sweet n low, occasional alcohol      Exercise   Exercise Type ADL's   diverticulits inhibits exercise     Patient Education   Previous Diabetes Education Yes (please comment)    Disease state  Definition of diabetes, type 1 and 2, and the diagnosis of diabetes    Nutrition management  Role of diet in the treatment of diabetes and the relationship between the three main macronutrients and blood glucose level    Physical activity and exercise  Role of exercise on diabetes management, blood pressure control and cardiac health.    Monitoring Identified appropriate SMBG and/or A1C goals.    Psychosocial adjustment Role of stress on diabetes      Individualized Goals (developed by patient)   Nutrition General guidelines for healthy choices and portions discussed;Other (comment)   reduce sweetened beverages    Monitoring  test my blood glucose as discussed      Outcomes   Expected Outcomes Demonstrated interest in learning. Expect positive outcomes    Future DMSE 2 months    Program Status Completed             Individualized Plan for Diabetes Self-Management Training:   Learning Objective:  Patient will have a greater understanding of diabetes self-management. Patient education plan is to attend individual and/or group sessions per assessed needs and concerns.   Patient Instructions  Ask your doctor about extended release metformin which may reduce side effects Some resources for more information: Diabetes.org Beyond Type 2 Diabetes app Use the Plate method as a guide for eating balanced eating When your diverticulitis clears start increasing your exercise Look into stress management  Expected Outcomes:  Demonstrated interest in learning. Expect positive outcomes  Education material provided: A1c chart, Labels, Dexcom & Freestyle libre cash price information  If problems or questions, patient to contact team via:  Best boy  Future DSME appointment: 2 months

## 2021-05-03 NOTE — Patient Instructions (Addendum)
Ask your doctor about extended release metformin which may reduce side effects Some resources for more information: Diabetes.org Beyond Type 2 Diabetes app Use the Plate method as a guide for eating balanced eating When your diverticulitis clears start increasing your exercise Look into stress management

## 2021-05-04 DIAGNOSIS — E1165 Type 2 diabetes mellitus with hyperglycemia: Secondary | ICD-10-CM | POA: Insufficient documentation

## 2021-05-05 DIAGNOSIS — M542 Cervicalgia: Secondary | ICD-10-CM | POA: Diagnosis not present

## 2021-05-05 DIAGNOSIS — M546 Pain in thoracic spine: Secondary | ICD-10-CM | POA: Diagnosis not present

## 2021-05-05 DIAGNOSIS — M9901 Segmental and somatic dysfunction of cervical region: Secondary | ICD-10-CM | POA: Diagnosis not present

## 2021-05-05 DIAGNOSIS — M9900 Segmental and somatic dysfunction of head region: Secondary | ICD-10-CM | POA: Diagnosis not present

## 2021-05-05 DIAGNOSIS — M9902 Segmental and somatic dysfunction of thoracic region: Secondary | ICD-10-CM | POA: Diagnosis not present

## 2021-05-05 DIAGNOSIS — M5030 Other cervical disc degeneration, unspecified cervical region: Secondary | ICD-10-CM | POA: Diagnosis not present

## 2021-05-05 DIAGNOSIS — M62830 Muscle spasm of back: Secondary | ICD-10-CM | POA: Diagnosis not present

## 2021-05-10 DIAGNOSIS — M9901 Segmental and somatic dysfunction of cervical region: Secondary | ICD-10-CM | POA: Diagnosis not present

## 2021-05-10 DIAGNOSIS — M5030 Other cervical disc degeneration, unspecified cervical region: Secondary | ICD-10-CM | POA: Diagnosis not present

## 2021-05-10 DIAGNOSIS — M546 Pain in thoracic spine: Secondary | ICD-10-CM | POA: Diagnosis not present

## 2021-05-10 DIAGNOSIS — M542 Cervicalgia: Secondary | ICD-10-CM | POA: Diagnosis not present

## 2021-05-10 DIAGNOSIS — M62830 Muscle spasm of back: Secondary | ICD-10-CM | POA: Diagnosis not present

## 2021-05-10 DIAGNOSIS — M9902 Segmental and somatic dysfunction of thoracic region: Secondary | ICD-10-CM | POA: Diagnosis not present

## 2021-05-10 DIAGNOSIS — M9900 Segmental and somatic dysfunction of head region: Secondary | ICD-10-CM | POA: Diagnosis not present

## 2021-05-11 DIAGNOSIS — E119 Type 2 diabetes mellitus without complications: Secondary | ICD-10-CM | POA: Diagnosis not present

## 2021-05-11 LAB — HM DIABETES EYE EXAM

## 2021-05-13 ENCOUNTER — Other Ambulatory Visit: Payer: Self-pay | Admitting: Family Medicine

## 2021-05-14 ENCOUNTER — Ambulatory Visit
Admission: RE | Admit: 2021-05-14 | Discharge: 2021-05-14 | Disposition: A | Discharge status: Home / Self Care | Payer: Medicare Other | Source: Ambulatory Visit | Attending: Family Medicine | Admitting: Family Medicine

## 2021-05-14 ENCOUNTER — Other Ambulatory Visit: Payer: Self-pay

## 2021-05-14 DIAGNOSIS — R059 Cough, unspecified: Secondary | ICD-10-CM

## 2021-05-14 DIAGNOSIS — R053 Chronic cough: Secondary | ICD-10-CM | POA: Diagnosis not present

## 2021-05-18 ENCOUNTER — Encounter: Payer: Self-pay | Admitting: Family Medicine

## 2021-05-18 NOTE — Telephone Encounter (Signed)
I spoke with pt and she does not think she needs to go to ED; pt is resting in chair now and feels OK; BP 135/77 P 90.pt said she has had lightheadedness and feeling off balance like she was drunk on and off today. Does not feel like room moving around; nauseated but no vomiting and no fever. No SOB or difficulty breathing. No weakness generalized or no weakness in arms or legs. Pt not having problem focusing, no problem talking and no confusion. Pt does feel tired but that is not unusual since taking care of a 70 y.o. pt wants to rest tonight and see how she feels. Pt scheduled in office appt with Dr Darnell Level on 05/19/21 at 2:30. UC & ED precautions given and pt voiced understanding. Sending note to Dr Damita Dunnings and Dr Darnell Level.

## 2021-05-19 ENCOUNTER — Other Ambulatory Visit: Payer: Self-pay

## 2021-05-19 ENCOUNTER — Ambulatory Visit (INDEPENDENT_AMBULATORY_CARE_PROVIDER_SITE_OTHER): Payer: Medicare Other | Admitting: Family Medicine

## 2021-05-19 ENCOUNTER — Other Ambulatory Visit (INDEPENDENT_AMBULATORY_CARE_PROVIDER_SITE_OTHER): Payer: Medicare Other

## 2021-05-19 ENCOUNTER — Encounter: Payer: Self-pay | Admitting: Family Medicine

## 2021-05-19 VITALS — BP 140/78 | HR 97 | Temp 98.0°F | Ht 67.0 in | Wt 170.2 lb

## 2021-05-19 DIAGNOSIS — E1165 Type 2 diabetes mellitus with hyperglycemia: Secondary | ICD-10-CM

## 2021-05-19 DIAGNOSIS — M899 Disorder of bone, unspecified: Secondary | ICD-10-CM | POA: Diagnosis not present

## 2021-05-19 DIAGNOSIS — G4733 Obstructive sleep apnea (adult) (pediatric): Secondary | ICD-10-CM | POA: Diagnosis not present

## 2021-05-19 DIAGNOSIS — R42 Dizziness and giddiness: Secondary | ICD-10-CM | POA: Diagnosis not present

## 2021-05-19 DIAGNOSIS — E785 Hyperlipidemia, unspecified: Secondary | ICD-10-CM | POA: Diagnosis not present

## 2021-05-19 DIAGNOSIS — R29810 Facial weakness: Secondary | ICD-10-CM | POA: Diagnosis not present

## 2021-05-19 DIAGNOSIS — R7989 Other specified abnormal findings of blood chemistry: Secondary | ICD-10-CM

## 2021-05-19 DIAGNOSIS — E118 Type 2 diabetes mellitus with unspecified complications: Secondary | ICD-10-CM

## 2021-05-19 DIAGNOSIS — E119 Type 2 diabetes mellitus without complications: Secondary | ICD-10-CM | POA: Diagnosis not present

## 2021-05-19 DIAGNOSIS — Z8673 Personal history of transient ischemic attack (TIA), and cerebral infarction without residual deficits: Secondary | ICD-10-CM | POA: Diagnosis not present

## 2021-05-19 DIAGNOSIS — E1169 Type 2 diabetes mellitus with other specified complication: Secondary | ICD-10-CM | POA: Diagnosis not present

## 2021-05-19 DIAGNOSIS — I10 Essential (primary) hypertension: Secondary | ICD-10-CM | POA: Diagnosis not present

## 2021-05-19 DIAGNOSIS — IMO0002 Reserved for concepts with insufficient information to code with codable children: Secondary | ICD-10-CM

## 2021-05-19 DIAGNOSIS — E039 Hypothyroidism, unspecified: Secondary | ICD-10-CM

## 2021-05-19 DIAGNOSIS — M2550 Pain in unspecified joint: Secondary | ICD-10-CM

## 2021-05-19 DIAGNOSIS — M8580 Other specified disorders of bone density and structure, unspecified site: Secondary | ICD-10-CM

## 2021-05-19 LAB — COMPREHENSIVE METABOLIC PANEL
ALT: 102 U/L — ABNORMAL HIGH (ref 0–35)
AST: 73 U/L — ABNORMAL HIGH (ref 0–37)
Albumin: 4.2 g/dL (ref 3.5–5.2)
Alkaline Phosphatase: 109 U/L (ref 39–117)
BUN: 15 mg/dL (ref 6–23)
CO2: 27 mEq/L (ref 19–32)
Calcium: 9.6 mg/dL (ref 8.4–10.5)
Chloride: 101 mEq/L (ref 96–112)
Creatinine, Ser: 0.66 mg/dL (ref 0.40–1.20)
GFR: 88.62 mL/min (ref >60.00)
Glucose, Bld: 160 mg/dL — ABNORMAL HIGH (ref 70–99)
Potassium: 3.9 mEq/L (ref 3.5–5.1)
Sodium: 139 mEq/L (ref 135–145)
Total Bilirubin: 0.6 mg/dL (ref 0.2–1.2)
Total Protein: 6.8 g/dL (ref 6.0–8.3)

## 2021-05-19 LAB — CBC WITH DIFFERENTIAL/PLATELET
Basophils Absolute: 0 10*3/uL (ref 0.0–0.1)
Basophils Relative: 0.5 % (ref 0.0–3.0)
Eosinophils Absolute: 0.1 10*3/uL (ref 0.0–0.7)
Eosinophils Relative: 1.9 % (ref 0.0–5.0)
HCT: 42 % (ref 36.0–46.0)
Hemoglobin: 14.1 g/dL (ref 12.0–15.0)
Lymphocytes Relative: 29.8 % (ref 12.0–46.0)
Lymphs Abs: 1.9 10*3/uL (ref 0.7–4.0)
MCHC: 33.4 g/dL (ref 30.0–36.0)
MCV: 92.3 fl (ref 78.0–100.0)
Monocytes Absolute: 0.4 10*3/uL (ref 0.1–1.0)
Monocytes Relative: 6.9 % (ref 3.0–12.0)
Neutro Abs: 3.8 10*3/uL (ref 1.4–7.7)
Neutrophils Relative %: 60.9 % (ref 43.0–77.0)
Platelets: 140 10*3/uL — ABNORMAL LOW (ref 150.0–400.0)
RBC: 4.56 Mil/uL (ref 3.87–5.11)
RDW: 14.2 % (ref 11.5–15.5)
WBC: 6.3 10*3/uL (ref 4.0–10.5)

## 2021-05-19 LAB — TSH: TSH: 4.28 u[IU]/mL (ref 0.35–5.50)

## 2021-05-19 LAB — LIPID PANEL
Cholesterol: 230 mg/dL — ABNORMAL HIGH (ref 0–200)
HDL: 32.5 mg/dL — ABNORMAL LOW (ref >39.00)
NonHDL: 197.76
Total CHOL/HDL Ratio: 7
Triglycerides: 320 mg/dL — ABNORMAL HIGH (ref 0.0–149.0)
VLDL: 64 mg/dL — ABNORMAL HIGH (ref 0.0–40.0)

## 2021-05-19 LAB — VITAMIN D 25 HYDROXY (VIT D DEFICIENCY, FRACTURES): VITD: 64.53 ng/mL (ref 30.00–100.00)

## 2021-05-19 LAB — LDL CHOLESTEROL, DIRECT: Direct LDL: 162 mg/dL

## 2021-05-19 LAB — HEMOGLOBIN A1C: Hgb A1c MFr Bld: 7.9 % — ABNORMAL HIGH (ref 4.6–6.5)

## 2021-05-19 MED ORDER — TURMERIC 500 MG PO CAPS: 2.0000 | ORAL_CAPSULE | Freq: Every day | ORAL | Status: DC

## 2021-05-19 NOTE — Patient Instructions (Addendum)
With recent dizziness and L lip drooping and history of stroke, let's get brain MRI.  Keep follow up appointment with Dr Damita Dunnings next week.  Start aspirin '81mg'$  daily, enteric coated.

## 2021-05-19 NOTE — Progress Notes (Signed)
Patient ID: Allison Yoder, female    DOB: September 16, 1950, 70 y.o.   MRN: [id number]  This visit was conducted in person.  BP 140/78   Pulse 97   Temp 98 F (36.7 C) (Temporal)   Ht '5\' 7"'  (1.702 m)   Wt 170 lb 4 oz (77.2 kg)   SpO2 97%   BMI 26.66 kg/m   Orthostatic VS for the past 24 hrs (Last 3 readings):  BP- Lying BP- Standing at 3 minutes  05/19/21 1523 -- 140/70  05/19/21 1520 154/84 --    CC: dizziness Subjective:   HPI: Allison Yoder is a 70 y.o. female presenting on 05/19/2021 for Dizziness (C/o feeling lightheaded and off balance.  Sxs started 3-4 mo ago.)   1d h/o dizziness described as feeling off balance with dizzy sensation when she stands up or bends over. Mild nausea and headache also associated with this. Today feeling overall better but nausea persists. Over the past few months noticing increased trouble with balance - ie with sudden position changes.  No medication changes recently.  She did recently see chiropractor who did adjustment to neck last week.  Symptoms started yesterday ~1:30pm  Over the last 2-3 weeks she also noticed L upper lip is droopy.   No fevers/chills, hearing changes, tinnitus.  Denies inciting trauma/injury or falls.  Ongoing sinus symptoms since 06/2020 (cares for 71yo in preschool).   Taking a break off praluent due to transaminitis.  She had some labwork this morning at 11:30am.  She is off celebrex as well, she does take turmeric 1042m daily with improvement.  Rare flexeril use.   DM - on metformin 10064mbid HTN - on lisinopril 4065maily and toprol XL 19m35mily H/o CVA 01/2016 - presented with memory lapse.   She's not taking advair- no h/o asthma, COPD, didn't feel it helped.  No h/o irregular heart beat.  Today's labs weren't fasting.      Relevant past medical, surgical, family and social history reviewed and updated as indicated. Interim medical history since our last visit reviewed. Allergies and medications  reviewed and updated. Outpatient Medications Prior to Visit  Medication Sig Dispense Refill   albuterol (VENTOLIN HFA) 108 (90 Base) MCG/ACT inhaler INHALE 1 TO 2 PUFFS INTO THE LUNGS EVERY 6 HOURS AS NEEDED FOR WHEEZING OR SHORTNESS OF BREATH 25.5 g 1   Alirocumab (PRALUENT) 150 MG/ML SOAJ INJECT 1 PEN INTO THE SKIN EVERY 14 DAYS 2 mL 11   benzonatate (TESSALON) 200 MG capsule Take 1 capsule (200 mg total) by mouth 3 (three) times daily as needed. 30 capsule 1   Blood Glucose Monitoring Suppl (ONE TOUCH ULTRA 2) w/Device KIT Check blood sugar once daily and as instructed. Dx E11.9 1 each 0   citalopram (CELEXA) 20 MG tablet TAKE 1 TABLET BY MOUTH  DAILY 90 tablet 3   clonazePAM (KLONOPIN) 0.5 MG tablet Take 1 tablet (0.5 mg total) by mouth daily as needed for anxiety. 30 tablet 1   cyanocobalamin 1000 MCG tablet Take 1,000 mcg by mouth daily.     cyclobenzaprine (FLEXERIL) 10 MG tablet Take 1 tablet (10 mg total) by mouth daily as needed for muscle spasms. 90 tablet 3   fluticasone (FLONASE) 50 MCG/ACT nasal spray Place into both nostrils daily as needed for allergies or rhinitis.     glucose blood (ONE TOUCH ULTRA TEST) test strip Check blood sugar once daily and as instructed. Dx E11.9 100 each 1   lisinopril (  ZESTRIL) 40 MG tablet Take 1 tablet (40 mg total) by mouth daily. 90 tablet 3   metFORMIN (GLUCOPHAGE) 500 MG tablet TAKE 1 TO 2 TABLETS BY  MOUTH TWICE DAILY WITH  MEALS 360 tablet 3   metoprolol succinate (TOPROL-XL) 50 MG 24 hr tablet TAKE 1 TABLET BY MOUTH  DAILY WITH OR IMMEDIATELY  FOLLOWING A MEAL 90 tablet 3   Multiple Vitamin (MULTIVITAMIN) tablet Take 1 tablet by mouth daily.     omeprazole (PRILOSEC) 40 MG capsule Take 40 mg by mouth as needed.     ONETOUCH DELICA LANCETS 68G MISC Check blood sugar once daily and as instructed. Dx E11.9 100 each 1   Vitamin D, Cholecalciferol, 1000 units CAPS Take 3,000 Units by mouth daily.     fluticasone-salmeterol (ADVAIR HFA) 115-21  MCG/ACT inhaler Inhale 2 puffs into the lungs 2 (two) times daily. Rinse after use. 1 each 12   celecoxib (CELEBREX) 100 MG capsule Take 2 capsules (200 mg total) by mouth daily. (Patient taking differently: Take 100 mg by mouth daily.)     oxyCODONE-acetaminophen (PERCOCET/ROXICET) 5-325 MG tablet Take 1 tablet by mouth every 6 (six) hours as needed for severe pain. 6 tablet 0   No facility-administered medications prior to visit.     Per HPI unless specifically indicated in ROS section below Review of Systems  Objective:  BP 140/78   Pulse 97   Temp 98 F (36.7 C) (Temporal)   Ht '5\' 7"'  (1.702 m)   Wt 170 lb 4 oz (77.2 kg)   SpO2 97%   BMI 26.66 kg/m   Wt Readings from Last 3 Encounters:  05/19/21 170 lb 4 oz (77.2 kg)  04/09/21 171 lb (77.6 kg)  03/26/21 174 lb 8 oz (79.2 kg)      Physical Exam Vitals and nursing note reviewed.  Constitutional:      Appearance: Normal appearance. She is not ill-appearing.  HENT:     Head: Normocephalic and atraumatic.     Right Ear: Tympanic membrane, ear canal and external ear normal.     Left Ear: Tympanic membrane, ear canal and external ear normal.     Mouth/Throat:     Mouth: Mucous membranes are moist.     Pharynx: Oropharynx is clear. No oropharyngeal exudate or posterior oropharyngeal erythema.  Eyes:     Extraocular Movements: Extraocular movements intact.     Conjunctiva/sclera: Conjunctivae normal.     Pupils: Pupils are equal, round, and reactive to light.  Neck:     Thyroid: No thyroid mass or thyromegaly.     Vascular: No carotid bruit.  Cardiovascular:     Rate and Rhythm: Normal rate and regular rhythm.     Pulses: Normal pulses.     Heart sounds: Normal heart sounds. No murmur heard. Pulmonary:     Effort: Pulmonary effort is normal. No respiratory distress.     Breath sounds: Normal breath sounds. No wheezing, rhonchi or rales.  Musculoskeletal:     Cervical back: Normal range of motion and neck supple.      Right lower leg: No edema.     Left lower leg: No edema.  Skin:    General: Skin is warm and dry.     Capillary Refill: Capillary refill takes less than 2 seconds.     Findings: No rash.  Neurological:     Mental Status: She is alert.     Cranial Nerves: Facial asymmetry present. No cranial nerve deficit or dysarthria.  Sensory: Sensation is intact.     Motor: Motor function is intact. No weakness.     Coordination: Coordination is intact.     Gait: Gait is intact.     Comments:  CN 2-12 intact, except for faint but noticeable droop of left upper lip at baseline and with smile  EOMI FTN intact No pronator drift Negative romberg 5/5 strength BUE/BLE Grip strength intact Sensation intact to light touch throughout face and body bilaterally Neg Dix Hallpike bilaterally, but feels lightheaded with transition to seating after supine during [facility] test  Psychiatric:        Mood and Affect: Mood normal.        Behavior: Behavior normal.      Results for orders placed or performed in visit on 05/19/21  Comprehensive metabolic panel  Result Value Ref Range   Sodium 139 135 - 145 mEq/L   Potassium 3.9 3.5 - 5.1 mEq/L   Chloride 101 96 - 112 mEq/L   CO2 27 19 - 32 mEq/L   Glucose, Bld 160 (H) 70 - 99 mg/dL   BUN 15 6 - 23 mg/dL   Creatinine, Ser 0.66 0.40 - 1.20 mg/dL   Total Bilirubin 0.6 0.2 - 1.2 mg/dL   Alkaline Phosphatase 109 39 - 117 U/L   AST 73 (H) 0 - 37 U/L   ALT 102 (H) 0 - 35 U/L   Total Protein 6.8 6.0 - 8.3 g/dL   Albumin 4.2 3.5 - 5.2 g/dL   GFR 88.62 >60.00 mL/min   Calcium 9.6 8.4 - 10.5 mg/dL  TSH  Result Value Ref Range   TSH 4.28 0.35 - 5.50 uIU/mL  Hemoglobin A1c  Result Value Ref Range   Hgb A1c MFr Bld 7.9 (H) 4.6 - 6.5 %  VITAMIN D 25 Hydroxy (Vit-D Deficiency, Fractures)  Result Value Ref Range   VITD 64.53 30.00 - 100.00 ng/mL  Lipid panel  Result Value Ref Range   Cholesterol 230 (H) 0 - 200 mg/dL   Triglycerides 320.0 (H) 0.0 - 149.0 mg/dL    HDL 32.50 (L) >39.00 mg/dL   VLDL 64.0 (H) 0.0 - 40.0 mg/dL   Total CHOL/HDL Ratio 7    NonHDL 197.76   CBC with Differential/Platelet  Result Value Ref Range   WBC 6.3 4.0 - 10.5 K/uL   RBC 4.56 3.87 - 5.11 Mil/uL   Hemoglobin 14.1 12.0 - 15.0 g/dL   HCT 42.0 36.0 - 46.0 %   MCV 92.3 78.0 - 100.0 fl   MCHC 33.4 30.0 - 36.0 g/dL   RDW 14.2 11.5 - 15.5 %   Platelets 140.0 (L) 150.0 - 400.0 K/uL   Neutrophils Relative % 60.9 43.0 - 77.0 %   Lymphocytes Relative 29.8 12.0 - 46.0 %   Monocytes Relative 6.9 3.0 - 12.0 %   Eosinophils Relative 1.9 0.0 - 5.0 %   Basophils Relative 0.5 0.0 - 3.0 %   Neutro Abs 3.8 1.4 - 7.7 K/uL   Lymphs Abs 1.9 0.7 - 4.0 K/uL   Monocytes Absolute 0.4 0.1 - 1.0 K/uL   Eosinophils Absolute 0.1 0.0 - 0.7 K/uL   Basophils Absolute 0.0 0.0 - 0.1 K/uL  LDL cholesterol, direct  Result Value Ref Range   Direct LDL 162.0 mg/dL    Assessment & Plan:  This visit occurred during the SARS-CoV-2 public health emergency.  Safety protocols were in place, including screening questions prior to the visit, additional usage of staff PPE, and extensive cleaning of exam  room while observing appropriate contact time as indicated for disinfecting solutions.   Over 45 minutes were spent face-to-face with the patient during this encounter or in coordination of care, reviewing past records, time spent documenting, lab results or radiology, or educating the patient or family.  Problem List Items Addressed This Visit     Hyperlipidemia associated with type 2 diabetes mellitus ([place])    Statin intolerance. Recently off praluent due to transaminitis.  High risk in diabetic and prior stroke, recent labs through PCP with LDL 165 - will need to restart PCSK9i vs return to lipid clinic.       Hypothyroid    TSH pending through PCP labs, not on thyroid replacement.       Obstructive sleep apnea of adult    Will need to verify continues CPAP       Hypertensive disorder    BP  adequate on lisinopril 15m and toprol XL 521mdaily. No changes to antihypertensive while we r/o stroke.       History of cerebrovascular accident (CVA) due to ischemia    Update MRI in h/o prior stroke with new neurological symptoms.       Diabetes mellitus type 2, uncontrolled, with complications (HCC)    Recent A1c elevated at 7.8% - she continues full dose metformin 100060mid       Arthralgia    She's stopped celebrex in interim (not effective) and instead has started taking turmeric 1000m8mily.       Facial droop    Pt noted mild droopy left upper lip over the last several weeks, evident on exam as well. Concern for recurrent stroke - pending MRI.  Consider further neck imaging with carotid US [place] CTA.       Relevant Orders   MR Brain Wo Contrast   Dizziness - Primary    Nonspecific dizziness/lightheadedness on exam today, dix hallpike negative. 14 pt orthostatic change in systolic and diastolic pressures on exam today. Pt feels she's staying well hydrated.  Ddx includes orthostatic pressure changes, peripheral vertigo, with lip drooping and risk factors need to rule out central cause like recurrent stroke. I have ordered MRI to be expedited to evaluate this. I did ask her to start enteric coated baby aspirin in the interim.  Keep upcoming appt with PCP next week. Consider neck imaging at that time. Echo/carotid US [place] reassuring when done during work up of previous stroke back in 2017.       Relevant Orders   MR Brain Wo Contrast     Meds ordered this encounter  Medications   Turmeric 500 MG CAPS    Sig: Take 2 capsules by mouth daily.    Orders Placed This Encounter  Procedures   MR Brain Wo Contrast    Ok to do tomorrow  [employer] MCR Epic ORDER NO COVID WT:170 HT:5'7 NO NEEDS/NO CLAUS/ NO METAL REMOVED/ NO IMPLANTS/ NO BULLETS OR BB'S/ NO GLUCOSE MONITOR, SPINAL STIMULATOR OR INJECTORS/ NO PREV SX/ NO BRAIN HEART EYE OR EAR SX DP AND PT ORDER CHECKED DP  05-19-2021 ** PT AWARE OF 75$ NO SHOW FEE**    Standing Status:   Future    Standing Expiration Date:   05/19/2022    Order Specific Question:   What is the patient's sedation requirement?    Answer:   No Sedation    Order Specific Question:   Does the patient have a pacemaker or implanted devices?    Answer:  No    Order Specific Question:   Preferred imaging location?    Answer:   [facility] (table limit-550lbs)     Patient Instructions  With recent dizziness and L lip drooping and history of stroke, let's get brain MRI.  Keep follow up appointment with Dr Damita Dunnings next week.  Start aspirin 57m daily, enteric coated.   Follow up plan: Return if symptoms worsen or fail to improve.  JRia Bush MD

## 2021-05-20 ENCOUNTER — Ambulatory Visit
Admission: RE | Admit: 2021-05-20 | Discharge: 2021-05-20 | Disposition: A | Discharge status: Home / Self Care | Payer: Medicare Other | Source: Ambulatory Visit | Attending: Family Medicine | Admitting: Family Medicine

## 2021-05-20 ENCOUNTER — Other Ambulatory Visit: Payer: Self-pay | Admitting: Family Medicine

## 2021-05-20 DIAGNOSIS — R29810 Facial weakness: Secondary | ICD-10-CM | POA: Insufficient documentation

## 2021-05-20 DIAGNOSIS — I6782 Cerebral ischemia: Secondary | ICD-10-CM | POA: Diagnosis not present

## 2021-05-20 DIAGNOSIS — G319 Degenerative disease of nervous system, unspecified: Secondary | ICD-10-CM | POA: Diagnosis not present

## 2021-05-20 DIAGNOSIS — R42 Dizziness and giddiness: Secondary | ICD-10-CM

## 2021-05-20 DIAGNOSIS — R110 Nausea: Secondary | ICD-10-CM | POA: Diagnosis not present

## 2021-05-20 NOTE — Assessment & Plan Note (Signed)
Will need to verify continues CPAP

## 2021-05-20 NOTE — Assessment & Plan Note (Signed)
Recent A1c elevated at 7.8% - she continues full dose metformin '1000mg'$  bid

## 2021-05-20 NOTE — Assessment & Plan Note (Signed)
Update MRI in h/o prior stroke with new neurological symptoms.

## 2021-05-20 NOTE — Assessment & Plan Note (Addendum)
TSH pending through PCP labs, not on thyroid replacement.

## 2021-05-20 NOTE — Assessment & Plan Note (Addendum)
Statin intolerance. Recently off praluent due to transaminitis.  High risk in diabetic and prior stroke, recent labs through PCP with LDL 165 - will need to restart PCSK9i vs return to lipid clinic.

## 2021-05-20 NOTE — Assessment & Plan Note (Signed)
BP adequate on lisinopril '40mg'$  and toprol XL '50mg'$  daily. No changes to antihypertensive while we r/o stroke.

## 2021-05-20 NOTE — Assessment & Plan Note (Addendum)
Nonspecific dizziness/lightheadedness on exam today, dix hallpike negative. 14 pt orthostatic change in systolic and diastolic pressures on exam today. Pt feels she's staying well hydrated.  Ddx includes orthostatic pressure changes, peripheral vertigo, with lip drooping and risk factors need to rule out central cause like recurrent stroke. I have ordered MRI to be expedited to evaluate this. I did ask her to start enteric coated baby aspirin in the interim.  Keep upcoming appt with PCP next week. Consider neck imaging at that time. Echo/carotid US were reassuring when done during work up of previous stroke back in 2017.

## 2021-05-20 NOTE — Assessment & Plan Note (Signed)
She's stopped celebrex in interim (not effective) and instead has started taking turmeric '1000mg'$  daily.

## 2021-05-20 NOTE — Assessment & Plan Note (Signed)
Pt noted mild droopy left upper lip over the last several weeks, evident on exam as well. Concern for recurrent stroke - pending MRI.  Consider further neck imaging with carotid US or neck CTA.

## 2021-05-23 ENCOUNTER — Telehealth: Payer: Self-pay | Admitting: Family Medicine

## 2021-05-23 NOTE — Telephone Encounter (Signed)
I need input from Dr. Johnsie Cancel and from Dr. Watt Climes about this patient's situation.  Given her vascular risk with diabetes and her lipid panel, I think it makes sense to restart Praluent.  I can discuss this with her at her upcoming office visit on 05/25/2021.  She has a persistent liver test elevation in spite of being off the medication temporarily and I need GI follow-up arranged for that.  Please send a copy of her labs to Dr. Watt Climes and ask for follow-up.  Dr. Hermelinda Medicus you have other preferences about her lipid management then please let me know.    I appreciate the help of all involved.

## 2021-05-23 NOTE — Telephone Encounter (Signed)
Josue Hector, MD  You; Filbert Berthold, CMA; Clarene Essex, MD 46 minutes ago (7:07 PM)   Ok to restart Praluent not sure I was involved in stopping   ============ Appreciate cardiology input.

## 2021-05-25 ENCOUNTER — Ambulatory Visit: Payer: Medicare Other | Admitting: Family Medicine

## 2021-05-25 NOTE — Telephone Encounter (Signed)
Pt's PCP apt has been moved to 9/15.  Faxed lab results to Dr. Watt Climes at [phone]

## 2021-05-27 ENCOUNTER — Ambulatory Visit: Payer: Medicare Other | Admitting: Family Medicine

## 2021-06-03 ENCOUNTER — Ambulatory Visit: Payer: Medicare Other | Admitting: Family Medicine

## 2021-06-07 ENCOUNTER — Encounter: Payer: Self-pay | Admitting: Family Medicine

## 2021-06-07 ENCOUNTER — Ambulatory Visit (INDEPENDENT_AMBULATORY_CARE_PROVIDER_SITE_OTHER): Payer: Medicare Other | Admitting: Family Medicine

## 2021-06-07 ENCOUNTER — Other Ambulatory Visit: Payer: Self-pay

## 2021-06-07 VITALS — BP 140/78 | HR 80 | Temp 97.5°F | Ht 67.0 in | Wt 171.0 lb

## 2021-06-07 DIAGNOSIS — E1165 Type 2 diabetes mellitus with hyperglycemia: Secondary | ICD-10-CM | POA: Diagnosis not present

## 2021-06-07 DIAGNOSIS — R7989 Other specified abnormal findings of blood chemistry: Secondary | ICD-10-CM

## 2021-06-07 DIAGNOSIS — M25519 Pain in unspecified shoulder: Secondary | ICD-10-CM | POA: Diagnosis not present

## 2021-06-07 DIAGNOSIS — E118 Type 2 diabetes mellitus with unspecified complications: Secondary | ICD-10-CM | POA: Diagnosis not present

## 2021-06-07 DIAGNOSIS — Z23 Encounter for immunization: Secondary | ICD-10-CM

## 2021-06-07 DIAGNOSIS — R0602 Shortness of breath: Secondary | ICD-10-CM | POA: Diagnosis not present

## 2021-06-07 DIAGNOSIS — R29810 Facial weakness: Secondary | ICD-10-CM

## 2021-06-07 DIAGNOSIS — R350 Frequency of micturition: Secondary | ICD-10-CM | POA: Diagnosis not present

## 2021-06-07 DIAGNOSIS — IMO0002 Reserved for concepts with insufficient information to code with codable children: Secondary | ICD-10-CM

## 2021-06-07 LAB — POC URINALSYSI DIPSTICK (AUTOMATED)
Bilirubin, UA: NEGATIVE
Blood, UA: NEGATIVE
Glucose, UA: POSITIVE — AB
Ketones, UA: NEGATIVE
Leukocytes, UA: NEGATIVE
Nitrite, UA: NEGATIVE
Protein, UA: POSITIVE — AB
Spec Grav, UA: 1.02 (ref 1.010–1.025)
Urobilinogen, UA: 0.2 E.U./dL
pH, UA: 6 (ref 5.0–8.0)

## 2021-06-07 MED ORDER — SITAGLIPTIN PHOSPHATE 50 MG PO TABS: 50.0000 mg | ORAL_TABLET | Freq: Every day | ORAL | 3 refills | Status: DC

## 2021-06-07 MED ORDER — OMEPRAZOLE 40 MG PO CPDR: 40.0000 mg | DELAYED_RELEASE_CAPSULE | ORAL | 3 refills | Status: DC | PRN

## 2021-06-07 NOTE — Progress Notes (Signed)
This visit occurred during the SARS-CoV-2 public health emergency.  Safety protocols were in place, including screening questions prior to the visit, additional usage of staff PPE, and extensive cleaning of exam room while observing appropriate contact time as indicated for disinfecting solutions.  Discussed with patient about recent events and studies.  History of vertigo with MRI done.  MRI with: No evidence of acute intracranial abnormality. Mild chronic small-vessel ischemic changes within the cerebral white matter, slightly progressed from the brain MRI of 01/25/2016. Mild generalized cerebral and cerebellar atrophy.  She still has mild vertigo that is positional.  Intermittent and not as severe as previous.  It is improving overall.  D/w pt about restart Praluent.  LFT elevation. Prev u/s with  IMPRESSION: 1. Heterogeneously increased hepatic echogenicity. This is a nonspecific finding but is most commonly seen with fatty infiltration of the liver. There are no obvious focal liver lesions.  Carotid imaging to be done - she has h/o facial droop- mild on the left.    Needs to continue improving A1c.  d/w pt about diabetic control.  She is still on metformin with inc in dose in the last few months.   Sugar has been as low as 118 but usually ~180s in the AMs.   D/w pt about options.  See plan.  She noted some urinary frequency w/o burning.  See notes on labs.  She is still using CPAP, compliant.    She had an episode of tachycardia after eating on 06/04/21.  Took a diazepam and went to sleep.  Felt better the next day.  She has some exertional SOB with [place] the beach recently.  No CP at the time.  She had low risk stress test this year.  Noted that she had been off PPI for about 1 month, unclear if that contributes.  PPI rx sent today.    She has been having episodic left shoulder pain, see exam below.  Meds, vitals, and allergies reviewed.   ROS: Per HPI unless  specifically indicated in ROS section   GEN: nad, alert and oriented HEENT: minimal L facial droop compared to right side NECK: supple w/o LA CV: rrr. PULM: ctab, no inc wob ABD: soft, +bs EXT: no edema SKIN: no acute rash  L shoulder pain.  No pain with AC testing or ext rotation but pain on int rotation with improvement with scap manipulation.  D/w pt about anatomy and HEP program.

## 2021-06-07 NOTE — Patient Instructions (Addendum)
We'll call about scheduling the carotid exam.   We'll update Dr. Watt Climes and ask for his input.  Restart praluent and see if you notice any changes.   Let me know if you don't tolerate [place].   Plan on recheck A1c in about 3 months.   Use the shoulder exercises.  Take care.  Glad to see you.

## 2021-06-08 ENCOUNTER — Ambulatory Visit (HOSPITAL_COMMUNITY)
Admission: RE | Admit: 2021-06-08 | Discharge: 2021-06-08 | Disposition: A | Discharge status: Home / Self Care | Payer: Medicare Other | Source: Ambulatory Visit | Attending: Cardiovascular Disease | Admitting: Cardiovascular Disease

## 2021-06-08 DIAGNOSIS — R29810 Facial weakness: Secondary | ICD-10-CM

## 2021-06-08 LAB — URINE CULTURE
MICRO NUMBER:: 12422378
Result:: NO GROWTH
SPECIMEN QUALITY:: ADEQUATE

## 2021-06-09 DIAGNOSIS — R0602 Shortness of breath: Secondary | ICD-10-CM | POA: Insufficient documentation

## 2021-06-09 DIAGNOSIS — R350 Frequency of micturition: Secondary | ICD-10-CM | POA: Insufficient documentation

## 2021-06-09 NOTE — Assessment & Plan Note (Signed)
Likely rotator cuff irritation.  Can use home exercise program and update me as needed.  She may improve with home exercise program, especially since she had improvement with scapular manipulation during exam.

## 2021-06-09 NOTE — Assessment & Plan Note (Signed)
Okay for outpatient follow-up.  See notes on labs.

## 2021-06-09 NOTE — Assessment & Plan Note (Addendum)
Discussed med options I asked her to let me know if she does not tolerate addition of januvia.   Plan on recheck A1c in about 3 months.  Continue work on diet and exercise.

## 2021-06-09 NOTE — Assessment & Plan Note (Addendum)
With lungs clear on exam.  She has been off PPI for about 1 month, unclear if that contributes.  Restart PPI.  She had 1 episode of tachycardia noted on 06/04/2021.  She is not tachycardic now she sounds regular.  I will update cardiology in the meantime and ask for cardiology input.

## 2021-06-09 NOTE — Assessment & Plan Note (Signed)
Reasonable to get carotid exam done and go from there.  See notes on imaging.  Restart Praluent.

## 2021-06-09 NOTE — Assessment & Plan Note (Signed)
Did not improve up arrival.  Will update GI clinic.  I need GI input about future work-up and potential liver biopsy.

## 2021-06-17 ENCOUNTER — Telehealth: Payer: Self-pay

## 2021-06-17 NOTE — Progress Notes (Addendum)
Chronic Care Management Pharmacy Assistant   Name: CAMRYN LAMPSON  MRN: [id number] DOB: 07/24/1951  Reason for Encounter: Diabetes Disease State   Recent office visits:  06/07/2021 - Elsie Stain, MD - Patient presented for Influenza Vaccination. Stop: benzonatate (TESSALON) 200 MG capsule. Start: sitaGLIPtin (JANUVIA) 50 MG tablet.  Recent consult visits:  06/08/2021 - Quay Burow, MD - Patient presented for Carotid Facial Droop. No other information.  05/19/2021 - Ria Bush, MD - Patient presented for dizziness. Lab: MR Brain wo contrast. Start: Turmeric 500 MG CAPS and Aspiring 33m daily. Stop: celecoxib (CELEBREX) 100 MG capsule and fluticasone-salmeterol (ADVAIR HFA) 115-21 MCG/ACT inhaler and oxyCODONE-acetaminophen (PERCOCET/ROXICET) 5-325 MG tablet. 05/03/2021 - ALevie Heritage RD - Patient presented for diabetes education.  04/28/2021 - JEverlene Farrier OBGYN - Patient presented for gynecologic exam. No other information.  04/09/2021 - GElsie Stain MD - Patient presented for cough. Lab: DG Chest 2 View. Start: fluticasone (FLOVENT HFA) 110 MCG/ACT inhaler. Hold: Praluent for 1 month.   Hospital visits:  None since last CCM contact  Medications: Outpatient Encounter Medications as of 06/17/2021  Medication Sig   albuterol (VENTOLIN HFA) 108 (90 Base) MCG/ACT inhaler INHALE 1 TO 2 PUFFS INTO THE LUNGS EVERY 6 HOURS AS NEEDED FOR WHEEZING OR SHORTNESS OF BREATH   Alirocumab (PRALUENT) 150 MG/ML SOAJ INJECT 1 PEN INTO THE SKIN EVERY 14 DAYS (Patient not taking: Reported on 06/07/2021)   Blood Glucose Monitoring Suppl (ONE TOUCH ULTRA 2) w/Device KIT Check blood sugar once daily and as instructed. Dx E11.9   citalopram (CELEXA) 20 MG tablet TAKE 1 TABLET BY MOUTH  DAILY   clonazePAM (KLONOPIN) 0.5 MG tablet Take 1 tablet (0.5 mg total) by mouth daily as needed for anxiety.   cyanocobalamin 1000 MCG tablet Take 1,000 mcg by mouth daily.   cyclobenzaprine (FLEXERIL) 10  MG tablet Take 1 tablet (10 mg total) by mouth daily as needed for muscle spasms.   fluticasone (FLONASE) 50 MCG/ACT nasal spray Place into both nostrils daily as needed for allergies or rhinitis.   glucose blood (ONE TOUCH ULTRA TEST) test strip Check blood sugar once daily and as instructed. Dx E11.9   lisinopril (ZESTRIL) 40 MG tablet Take 1 tablet (40 mg total) by mouth daily.   metFORMIN (GLUCOPHAGE) 500 MG tablet TAKE 1 TO 2 TABLETS BY  MOUTH TWICE DAILY WITH  MEALS   metoprolol succinate (TOPROL-XL) 50 MG 24 hr tablet TAKE 1 TABLET BY MOUTH  DAILY WITH OR IMMEDIATELY  FOLLOWING A MEAL   Multiple Vitamin (MULTIVITAMIN) tablet Take 1 tablet by mouth daily.   omeprazole (PRILOSEC) 40 MG capsule Take 1 capsule (40 mg total) by mouth as needed.   ONETOUCH DELICA LANCETS 329JMISC Check blood sugar once daily and as instructed. Dx E11.9   sitaGLIPtin (JANUVIA) 50 MG tablet Take 1 tablet (50 mg total) by mouth daily.   Turmeric 500 MG CAPS Take 2 capsules by mouth daily.   Vitamin D, Cholecalciferol, 1000 units CAPS Take 3,000 Units by mouth daily.   No facility-administered encounter medications on file as of 06/17/2021.    Recent Relevant Labs: Lab Results  Component Value Date/Time   HGBA1C 7.9 (H) 05/19/2021 11:34 AM   HGBA1C 8.6 (A) 03/26/2021 03:54 PM   HGBA1C 7.5 (A) 12/11/2020 04:12 PM   HGBA1C 8.2 (H) 05/31/2019 09:28 AM    Kidney Function Lab Results  Component Value Date/Time   CREATININE 0.66 05/19/2021 11:34 AM   CREATININE 0.62 04/01/2021 11:04  AM   CREATININE 0.78 03/26/2021 04:30 PM   CREATININE 0.73 11/13/2015 03:57 PM   GFR 88.62 05/19/2021 11:34 AM   GFRNONAA >60 04/01/2021 11:04 AM   GFRAA >60 12/01/2016 10:40 AM   Attempted contact with patient 3 times on 10/6, 10/7 and 10/12. Unsuccessful outreach. Will attempt contact next month.    Current antihyperglycemic regimen:  Metformin 500 mg - 2 tablets twice daily (2000 mg/day)    Adherence Review: Is the  patient currently on a STATIN medication? No Is the patient currently on ACE/ARB medication? Yes Does the patient have >5 day gap between last estimated fill dates? No  Care Gaps: Annual wellness visit in last year? No 06/10/2018 Most recent A1C reading: 7.9 on 05/19/2021 Most Recent BP reading: 140/78 on 06/07/2021  Last eye exam / retinopathy screening: 05/11/2021 Last diabetic foot exam: 12/11/2020  Star Rating Drugs: Medication:  Last Fill: Day Supply Lisinopril 98m            06/17/2021 90 Metformin 5039m        06/11/2021   90  Patient upcoming appointment: 07/05/2021 Diabetes Management  MiDebbora DusCPP notified  AmMarijean NiemannRMPort Ewenssistant [phone] have reviewed the care management and care coordination activities outlined in this encounter and I am certifying that I agree with the content of this note. No further action required.  MiDebbora DusPharmD Clinical Pharmacist [phone]

## 2021-06-28 ENCOUNTER — Other Ambulatory Visit (HOSPITAL_COMMUNITY): Payer: Self-pay | Admitting: Gastroenterology

## 2021-06-28 DIAGNOSIS — R7989 Other specified abnormal findings of blood chemistry: Secondary | ICD-10-CM

## 2021-07-01 DIAGNOSIS — Z23 Encounter for immunization: Secondary | ICD-10-CM | POA: Diagnosis not present

## 2021-07-05 ENCOUNTER — Ambulatory Visit: Payer: Medicare Other | Admitting: Registered"

## 2021-07-12 ENCOUNTER — Other Ambulatory Visit: Payer: Self-pay

## 2021-07-12 MED ORDER — PRALUENT 150 MG/ML ~~LOC~~ SOAJ
SUBCUTANEOUS | 11 refills | Status: DC
Start: 2021-07-12 — End: 2021-12-24

## 2021-07-12 NOTE — Telephone Encounter (Signed)
Shaianne Westra Key: [id number] PA Case ID: [id number] Need help? Call us at [phone] Outcome Approvedtoday Request Reference Number: [id number]. PRALUENT INJ 150MG /ML is approved through 09/11/2022. Your patient may now fill this prescription and it will be covered.

## 2021-07-15 ENCOUNTER — Telehealth: Payer: Self-pay

## 2021-07-15 NOTE — Progress Notes (Addendum)
Chronic Care Management Pharmacy Assistant   Name: Allison Yoder  MRN: [id number] DOB: [birth year]  Reason for Encounter: CCM (Diabetes Disease State)   Recent office visits:  None since last CCM contact  Recent consult visits:  None since last CCM contact  Hospital visits:  None since last CCM contact  Medications: Outpatient Encounter Medications as of 07/15/2021  Medication Sig   albuterol (VENTOLIN HFA) 108 (90 Base) MCG/ACT inhaler INHALE 1 TO 2 PUFFS INTO THE LUNGS EVERY 6 HOURS AS NEEDED FOR WHEEZING OR SHORTNESS OF BREATH   Alirocumab (PRALUENT) 150 MG/ML SOAJ INJECT 1 PEN INTO THE SKIN EVERY 14 DAYS   Blood Glucose Monitoring Suppl (ONE TOUCH ULTRA 2) w/Device KIT Check blood sugar once daily and as instructed. Dx E11.9   citalopram (CELEXA) 20 MG tablet TAKE 1 TABLET BY MOUTH  DAILY   clonazePAM (KLONOPIN) 0.5 MG tablet Take 1 tablet (0.5 mg total) by mouth daily as needed for anxiety.   cyanocobalamin 1000 MCG tablet Take 1,000 mcg by mouth daily.   cyclobenzaprine (FLEXERIL) 10 MG tablet Take 1 tablet (10 mg total) by mouth daily as needed for muscle spasms.   fluticasone (FLONASE) 50 MCG/ACT nasal spray Place into both nostrils daily as needed for allergies or rhinitis.   glucose blood (ONE TOUCH ULTRA TEST) test strip Check blood sugar once daily and as instructed. Dx E11.9   lisinopril (ZESTRIL) 40 MG tablet Take 1 tablet (40 mg total) by mouth daily.   metFORMIN (GLUCOPHAGE) 500 MG tablet TAKE 1 TO 2 TABLETS BY  MOUTH TWICE DAILY WITH  MEALS   metoprolol succinate (TOPROL-XL) 50 MG 24 hr tablet TAKE 1 TABLET BY MOUTH  DAILY WITH OR IMMEDIATELY  FOLLOWING A MEAL   Multiple Vitamin (MULTIVITAMIN) tablet Take 1 tablet by mouth daily.   omeprazole (PRILOSEC) 40 MG capsule Take 1 capsule (40 mg total) by mouth as needed.   ONETOUCH DELICA LANCETS 73S MISC Check blood sugar once daily and as instructed. Dx E11.9   sitaGLIPtin (JANUVIA) 50 MG tablet Take 1 tablet (50 mg  total) by mouth daily.   Turmeric 500 MG CAPS Take 2 capsules by mouth daily.   Vitamin D, Cholecalciferol, 1000 units CAPS Take 3,000 Units by mouth daily.   No facility-administered encounter medications on file as of 07/15/2021.     Recent Relevant Labs: Lab Results  Component Value Date/Time   HGBA1C 7.9 (H) 05/19/2021 11:34 AM   HGBA1C 8.6 (A) 03/26/2021 03:54 PM   HGBA1C 7.5 (A) 12/11/2020 04:12 PM   HGBA1C 8.2 (H) 05/31/2019 09:28 AM    Kidney Function Lab Results  Component Value Date/Time   CREATININE 0.66 05/19/2021 11:34 AM   CREATININE 0.62 04/01/2021 11:04 AM   CREATININE 0.78 03/26/2021 04:30 PM   CREATININE 0.73 11/13/2015 03:57 PM   GFR 88.62 05/19/2021 11:34 AM   GFRNONAA >60 04/01/2021 11:04 AM   GFRAA >60 12/01/2016 10:40 AM    Attempted contact with patient 3 times on 11/03, 11/11 and 11/15. Unsuccessful outreach. Will attempt contact next month.   Current antihyperglycemic regimen:  Metformin 500 mg - 2 tablets twice daily (2000 mg/day)    Adherence Review: Is the patient currently on a STATIN medication? No Is the patient currently on ACE/ARB medication? Yes Does the patient have >5 day gap between last estimated fill dates? No  Care Gaps: Annual wellness visit in last year? No Most recent A1C reading: 7.9 on 05/19/2021 Most Recent BP reading: 140/78 on  06/07/2021  Last eye exam / retinopathy screening: Up to date Last diabetic foot exam: Up to date  Counseled patient on importance of annual eye and foot exam.   Star Rating Drugs:  Medication:  Last Fill: Day Supply Lisinopril 46m 06/17/2021 90 Metformin 5024m09/30/2022 90  08/02/2021 - patient has appointment for  [facility]   Michelle Adams, CPCresbardotified  AmMarijean NiemannRMBiloxissistant [phone] have reviewed the care management and care coordination activities outlined in this encounter and I am certifying that I agree with the content of this note. No  further action required.  MiDebbora DusPharmD Clinical Pharmacist [phone]

## 2021-07-20 ENCOUNTER — Ambulatory Visit (HOSPITAL_COMMUNITY): Payer: Medicare Other

## 2021-07-30 ENCOUNTER — Other Ambulatory Visit (HOSPITAL_COMMUNITY): Payer: Self-pay | Admitting: Physician Assistant

## 2021-08-02 ENCOUNTER — Other Ambulatory Visit: Payer: Self-pay

## 2021-08-02 ENCOUNTER — Ambulatory Visit (HOSPITAL_COMMUNITY)
Admission: RE | Admit: 2021-08-02 | Discharge: 2021-08-02 | Disposition: A | Discharge status: Home / Self Care | Payer: Medicare Other | Source: Ambulatory Visit | Attending: Gastroenterology | Admitting: Gastroenterology

## 2021-08-02 ENCOUNTER — Encounter (HOSPITAL_COMMUNITY): Payer: Self-pay

## 2021-08-02 DIAGNOSIS — E119 Type 2 diabetes mellitus without complications: Secondary | ICD-10-CM | POA: Insufficient documentation

## 2021-08-02 DIAGNOSIS — E785 Hyperlipidemia, unspecified: Secondary | ICD-10-CM | POA: Diagnosis not present

## 2021-08-02 DIAGNOSIS — K7581 Nonalcoholic steatohepatitis (NASH): Secondary | ICD-10-CM | POA: Diagnosis not present

## 2021-08-02 DIAGNOSIS — Z91040 Latex allergy status: Secondary | ICD-10-CM | POA: Diagnosis not present

## 2021-08-02 DIAGNOSIS — R945 Abnormal results of liver function studies: Secondary | ICD-10-CM | POA: Diagnosis present

## 2021-08-02 DIAGNOSIS — Z888 Allergy status to other drugs, medicaments and biological substances status: Secondary | ICD-10-CM | POA: Insufficient documentation

## 2021-08-02 DIAGNOSIS — R7989 Other specified abnormal findings of blood chemistry: Secondary | ICD-10-CM

## 2021-08-02 DIAGNOSIS — R748 Abnormal levels of other serum enzymes: Secondary | ICD-10-CM | POA: Diagnosis not present

## 2021-08-02 DIAGNOSIS — K7460 Unspecified cirrhosis of liver: Secondary | ICD-10-CM | POA: Diagnosis not present

## 2021-08-02 DIAGNOSIS — I10 Essential (primary) hypertension: Secondary | ICD-10-CM | POA: Diagnosis not present

## 2021-08-02 LAB — CBC
HCT: 42.4 % (ref 36.0–46.0)
Hemoglobin: 13.7 g/dL (ref 12.0–15.0)
MCH: 29.5 pg (ref 26.0–34.0)
MCHC: 32.3 g/dL (ref 30.0–36.0)
MCV: 91.2 fL (ref 80.0–100.0)
Platelets: 143 10*3/uL — ABNORMAL LOW (ref 150–400)
RBC: 4.65 MIL/uL (ref 3.87–5.11)
RDW: 13.5 % (ref 11.5–15.5)
WBC: 8.2 10*3/uL (ref 4.0–10.5)
nRBC: 0 % (ref 0.0–0.2)

## 2021-08-02 LAB — PROTIME-INR
INR: 1.2 (ref 0.8–1.2)
Prothrombin Time: 15.2 seconds (ref 11.4–15.2)

## 2021-08-02 LAB — GLUCOSE, CAPILLARY: Glucose-Capillary: 120 mg/dL — ABNORMAL HIGH (ref 70–99)

## 2021-08-02 MED ORDER — GELATIN ABSORBABLE 12-7 MM EX MISC
CUTANEOUS | Status: AC
  Filled 2021-08-02: qty 1

## 2021-08-02 MED ORDER — LIDOCAINE HCL (PF) 1 % IJ SOLN
INTRAMUSCULAR | Status: AC
  Filled 2021-08-02: qty 30

## 2021-08-02 MED ORDER — FENTANYL CITRATE (PF) 100 MCG/2ML IJ SOLN
INTRAMUSCULAR | Status: AC | PRN
  Administered 2021-08-02 (×2): 25 ug via INTRAVENOUS

## 2021-08-02 MED ORDER — FENTANYL CITRATE (PF) 100 MCG/2ML IJ SOLN
INTRAMUSCULAR | Status: AC
  Filled 2021-08-02: qty 2

## 2021-08-02 MED ORDER — MIDAZOLAM HCL 2 MG/2ML IJ SOLN
INTRAMUSCULAR | Status: AC
  Filled 2021-08-02: qty 2

## 2021-08-02 MED ORDER — MIDAZOLAM HCL 2 MG/2ML IJ SOLN
INTRAMUSCULAR | Status: AC | PRN
  Administered 2021-08-02: 1 mg via INTRAVENOUS

## 2021-08-02 MED ORDER — SODIUM CHLORIDE 0.9 % IV SOLN: INTRAVENOUS | Status: DC

## 2021-08-02 NOTE — Progress Notes (Signed)
Patient was given discharge instructions. She verbalized understanding. 

## 2021-08-02 NOTE — Procedures (Signed)
Interventional Radiology Procedure Note  Procedure: US guided random liver biopsy  Indication: Elevated liver enzymes  Findings: Please refer to procedural dictation for full description.  Complications: None  EBL: < 10 mL  Allison Mahoney, MD [phone]   

## 2021-08-02 NOTE — H&P (Signed)
Chief Complaint: Patient was seen in consultation today for liver core biopsy at the request of Magod,Marc  Referring Physician(s): Erda  Supervising Physician: Mir, Sharen Heck  Patient Status: [facility] - Out[facility]  History of Present Illness: Allison Yoder is a 70 y.o. female   Hx DM; HLD; HTN Has been aware of elevated liver functions over 1 yr now Referred to Dr Watt Climes Scheduled for liver core biopsy   Past Medical History:  Diagnosis Date   Allergy    Anemia    "all the time as a child"   Anxiety    Arthritis    "all my joints; worse in my hands" (10/27/2015)   Chronic lower back pain    Depression    Diverticulosis    Fatty liver    GERD (gastroesophageal reflux disease)    Heart murmur    "dx'd years ago; very mild; never treated" (10/27/2015)   Hyperlipidemia    Hypertension    Hypothyroidism 2002-~ 2010   Iritis    per [place]    "stopped when I went thru menopause"   OSA on CPAP    Pneumonia ~ 2013; 10/27/2015   Recurrent urinary tract infection    Routine general medical examination at a health care facility 02/26/2014   Snores    Stroke ([place])    Type II diabetes mellitus ([place])    "lost weight; started exercising again; don't have it anymore" (10/27/2015)   Urinary incontinence    Wears glasses     Past Surgical History:  Procedure Laterality Date   BLEPHAROPLASTY Bilateral 2013; 2016   BREAST LUMPECTOMY Right 1964   benign tumor,   HEMORRHOID BANDING  X 2   KNEE ARTHROSCOPY Left 2016   "meniscus repair"   PELVIC FLOOR REPAIR  2001   "lift"   POLYPECTOMY  X 3   "bladder"   SHOULDER ARTHROSCOPY W/ ROTATOR CUFF REPAIR Left 2013   SHOULDER ARTHROSCOPY W/ ROTATOR CUFF REPAIR Right 2015   TUBAL LIGATION  ~ [place]   Partial    Allergies: Cephalexin, Codeine, Inapsine [droperidol], Crestor [rosuvastatin], Lipitor [atorvastatin calcium], Niacin and related, Pravastatin, Adhesive [tape], and  Latex  Medications: Prior to Admission medications   Medication Sig Start Date End Date Taking? Authorizing Provider  Alirocumab (PRALUENT) 150 MG/ML SOAJ INJECT 1 PEN INTO THE SKIN EVERY 14 DAYS 07/12/21  Yes Josue Hector, MD  citalopram (CELEXA) 20 MG tablet TAKE 1 TABLET BY MOUTH  DAILY 11/25/20  Yes Tonia Ghent, MD  cyanocobalamin 1000 MCG tablet Take 1,000 mcg by mouth daily.   Yes [provider]  cyclobenzaprine (FLEXERIL) 10 MG tablet Take 1 tablet (10 mg total) by mouth daily as needed for muscle spasms. Patient taking differently: Take 10 mg by mouth at bedtime. 09/03/19  Yes Tonia Ghent, MD  lisinopril (ZESTRIL) 40 MG tablet Take 1 tablet (40 mg total) by mouth daily. 03/24/21  Yes Josue Hector, MD  metFORMIN (GLUCOPHAGE) 500 MG tablet TAKE 1 TO 2 TABLETS BY  MOUTH TWICE DAILY WITH  MEALS Patient taking differently: Take 1,000 mg by mouth 2 (two) times daily with a meal. 05/13/21  Yes Tonia Ghent, MD  metoprolol succinate (TOPROL-XL) 50 MG 24 hr tablet TAKE 1 TABLET BY MOUTH  DAILY WITH OR IMMEDIATELY  FOLLOWING A MEAL 09/18/20  Yes Tonia Ghent, MD  Multiple Vitamin (MULTIVITAMIN) tablet Take 1 tablet by mouth daily.  Yes [provider]  omeprazole (PRILOSEC) 40 MG capsule Take 1 capsule (40 mg total) by mouth as needed. Patient taking differently: Take 40 mg by mouth daily. 06/07/21  Yes Tonia Ghent, MD  sitaGLIPtin (JANUVIA) 50 MG tablet Take 1 tablet (50 mg total) by mouth daily. 06/07/21  Yes Tonia Ghent, MD  Turmeric 500 MG CAPS Take 2 capsules by mouth daily. 05/19/21  Yes Ria Bush, MD  Vitamin D, Cholecalciferol, 1000 units CAPS Take 3,000 Units by mouth daily.   Yes [provider]  albuterol (VENTOLIN HFA) 108 (90 Base) MCG/ACT inhaler INHALE 1 TO 2 PUFFS INTO THE LUNGS EVERY 6 HOURS AS NEEDED FOR WHEEZING OR SHORTNESS OF BREATH 07/29/20   Tonia Ghent, MD  Blood Glucose Monitoring Suppl (ONE TOUCH ULTRA 2)  w/Device KIT Check blood sugar once daily and as instructed. Dx E11.9 12/29/16   Tonia Ghent, MD  clonazePAM (KLONOPIN) 0.5 MG tablet Take 1 tablet (0.5 mg total) by mouth daily as needed for anxiety. 01/31/21   Tonia Ghent, MD  fluticasone [facility]) 50 MCG/ACT nasal spray Place 1 spray into both nostrils daily as needed for allergies or rhinitis.    [provider]  glucose blood (ONE TOUCH ULTRA TEST) test strip Check blood sugar once daily and as instructed. Dx E11.9 05/31/19   Tonia Ghent, MD  [facility] DELICA LANCETS 43P MISC Check blood sugar once daily and as instructed. Dx E11.9 12/29/16   Tonia Ghent, MD     Family History  Problem Relation Age of Onset   Arthritis Mother    Heart disease Mother    Hyperlipidemia Mother    Hypertension Mother    Diabetes Mother    Depression Mother    Stroke Mother    Alcohol abuse Father    Lung cancer Father    Arthritis Maternal Grandmother    Stroke Maternal Grandmother    Diabetes Maternal Grandmother    Heart disease Paternal Uncle        x 2   Arthritis Sister    Breast cancer Cousin    Heart disease Paternal Aunt    Diabetes Paternal Uncle    Diabetes Sister    Heart disease Paternal Uncle        x 5   Heart disease Paternal Aunt        x 3   Stroke Paternal Aunt    Dementia Paternal Grandmother    Colon cancer Neg Hx     Social History   Socioeconomic History   Marital status: Divorced    Spouse name: Not on file   Number of children: 1   Years of education: Not on file   Highest education level: Not on file  Occupational History   Occupation: Dental Receptionist/Assistant    Comment: Dr. Quillian Quince in [place]  Tobacco Use   Smoking status: Former    Packs/day: 2.00    Years: 5.00    Pack years: 10.00    Types: Cigarettes    Quit date: 09/12/1985    Years since quitting: 35.9   Smokeless tobacco: Never  Vaping Use   Vaping Use: Never used  Substance and Sexual Activity   Alcohol use:  Yes    Alcohol/week: 1.0 standard drink    Types: 1 Glasses of wine per week   Drug use: No   Sexual activity: Yes  Other Topics Concern   Not on file  Social History Narrative   Education:  BA   Dental assistant   Divorced and lives with her brother.     Caring for her grandson Glennon Mac) as of 2022   Social Determinants of Health   Financial Resource Strain: Low Risk    Difficulty of Paying Living Expenses: Not very hard  Food Insecurity: Not on file  Transportation Needs: Not on file  Physical Activity: Not on file  Stress: Not on file  Social Connections: Not on file     Review of Systems: A 12 point ROS discussed and pertinent positives are indicated in the HPI above.  All other systems are negative.  Review of Systems  Constitutional:  Negative for activity change, fatigue and fever.  Respiratory:  Negative for cough and shortness of breath.   Cardiovascular:  Negative for chest pain.  Gastrointestinal:  Negative for abdominal pain.  Psychiatric/Behavioral:  Negative for behavioral problems and confusion.    Vital Signs: BP 136/74   Pulse 77   Temp 98.3 F (36.8 C) (Oral)   Resp 12   Ht '5\' 7"'  (1.702 m)   Wt 170 lb (77.1 kg)   SpO2 97%   BMI 26.63 kg/m   Physical Exam HENT:     Mouth/Throat:     Mouth: Mucous membranes are moist.  Cardiovascular:     Rate and Rhythm: Normal rate and regular rhythm.     Heart sounds: Normal heart sounds.  Pulmonary:     Effort: Pulmonary effort is normal.     Breath sounds: Normal breath sounds.  Abdominal:     Palpations: Abdomen is soft.  Musculoskeletal:        General: Normal range of motion.     Right lower leg: No edema.     Left lower leg: No edema.  Skin:    General: Skin is warm.  Neurological:     Mental Status: She is alert and oriented to person, place, and time.  Psychiatric:        Behavior: Behavior normal.    Imaging: No results found.  Labs:  CBC: Recent Labs    04/01/21 1104  05/19/21 1134  WBC 13.8* 6.3  HGB 13.7 14.1  HCT 41.7 42.0  PLT 164 140.0*    COAGS: No results for input(s): INR, APTT in the last 8760 hours.  BMP: Recent Labs    03/01/21 1102 03/26/21 1630 04/01/21 1104 05/19/21 1134  NA 135 140 133* 139  K 4.4 4.1 3.9 3.9  CL 98 103 99 101  CO2 '29 28 24 27  ' GLUCOSE 274* 122* 226* 160*  BUN '16 19 13 15  ' CALCIUM 9.6 9.5 9.2 9.6  CREATININE 0.72 0.78 0.62 0.66  GFRNONAA  --   --  >60  --     LIVER FUNCTION TESTS: Recent Labs    12/08/20 0000 03/26/21 1630 04/01/21 1104 05/19/21 1134  BILITOT 0.6 0.5 1.3* 0.6  AST 141* 97* 49* 73*  ALT 176* 137* 78* 102*  ALKPHOS 135*  --  104 109  PROT 7.0 6.8 7.2 6.8  ALBUMIN 4.3  --  4.0 4.2    TUMOR MARKERS: No results for input(s): AFPTM, CEA, CA199, CHROMGRNA in the last 8760 hours.  Assessment and Plan:  Elevated LFTS over 1 yr For random liver biopsy Risks and benefits of liver core biopsy was discussed with the patient and/or patient's family including, but not limited to bleeding, infection, damage to adjacent structures or low yield requiring additional tests.  All of the questions were answered and  there is agreement to proceed.  Consent signed and in chart.   Thank you for this interesting consult.  I greatly enjoyed meeting Ruthellen V Philipson and look forward to participating in their care.  A copy of this report was sent to the requesting provider on this date.  Electronically Signed: Lavonia Drafts, PA-C 08/02/2021, 11:38 AM   I spent a total of  30 Minutes   in face to face in clinical consultation, greater than 50% of which was counseling/coordinating care for liver core biopsy

## 2021-08-03 LAB — SURGICAL PATHOLOGY

## 2021-08-05 ENCOUNTER — Other Ambulatory Visit: Payer: Self-pay | Admitting: Family Medicine

## 2021-08-10 ENCOUNTER — Encounter: Payer: Self-pay | Admitting: Family Medicine

## 2021-08-13 ENCOUNTER — Ambulatory Visit (HOSPITAL_COMMUNITY): Payer: Medicare Other

## 2021-08-20 DIAGNOSIS — K7689 Other specified diseases of liver: Secondary | ICD-10-CM | POA: Diagnosis not present

## 2021-08-30 ENCOUNTER — Ambulatory Visit: Payer: Medicare Other

## 2021-09-02 NOTE — Progress Notes (Signed)
Subjective:   Allison Yoder is a 70 y.o. female who presents for Medicare Annual (Subsequent) preventive examination.  I connected with [employer] today by telephone and verified that I am speaking with the correct person using two identifiers. Location patient: home Location provider: work Persons participating in the virtual visit: patient, Marine scientist.    I discussed the limitations, risks, security and privacy concerns of performing an evaluation and management service by telephone and the availability of in person appointments. I also discussed with the patient that there may be a patient responsible charge related to this service. The patient expressed understanding and verbally consented to this telephonic visit.    Interactive audio and video telecommunications were attempted between this provider and patient, however failed, due to patient having technical difficulties OR patient did not have access to video capability.  We continued and completed visit with audio only.  Some vital signs may be absent or patient reported.   Time Spent with patient on telephone encounter: 30 minutes  Review of Systems     Cardiac Risk Factors include: diabetes mellitus;advanced age (>22mn, >>70women);hypertension;dyslipidemia     Objective:    Today's Vitals   09/07/21 0946  Weight: 171 lb (77.6 kg)  Height: '5\' 7"'  (1.702 m)   Body mass index is 26.78 kg/m.  Advanced Directives 09/07/2021 08/02/2021 05/03/2021 06/27/2019 12/01/2016 11/11/2016 01/25/2016  Does Patient Have a Medical Advance Directive? Yes Yes Yes No - Yes No  Type of [place] - - Living will;Healthcare Power of [place] -  Does patient want to make changes to medical advance directive? Yes (MAU/Ambulatory/Procedural Areas - Information given) No - Patient declined No - Patient declined - - - -   Copy of Healthcare Power of Attorney in Chart? - - - - - Yes -  Would patient like information on creating a medical advance directive? - - - Yes (MAU/Ambulatory/Procedural Areas - Information given) - - No - patient declined information    Current Medications (verified) Outpatient Encounter Medications as of 09/07/2021  Medication Sig   albuterol (VENTOLIN HFA) 108 (90 Base) MCG/ACT inhaler INHALE 1 TO 2 PUFFS INTO THE LUNGS EVERY 6 HOURS AS NEEDED FOR WHEEZING OR SHORTNESS OF BREATH   Alirocumab (PRALUENT) 150 MG/ML SOAJ INJECT 1 PEN INTO THE SKIN EVERY 14 DAYS   Blood Glucose Monitoring Suppl (ONE TOUCH ULTRA 2) w/Device KIT Check blood sugar once daily and as instructed. Dx E11.9   citalopram (CELEXA) 20 MG tablet TAKE 1 TABLET BY MOUTH  DAILY   clonazePAM (KLONOPIN) 0.5 MG tablet Take 1 tablet (0.5 mg total) by mouth daily as needed for anxiety.   cyanocobalamin 1000 MCG tablet Take 1,000 mcg by mouth daily.   cyclobenzaprine (FLEXERIL) 10 MG tablet Take 1 tablet (10 mg total) by mouth daily as needed for muscle spasms. (Patient taking differently: Take 10 mg by mouth at bedtime.)   fluticasone (FLONASE) 50 MCG/ACT nasal spray Place 1 spray into both nostrils daily as needed for allergies or rhinitis.   glucose blood (ONE TOUCH ULTRA TEST) test strip Check blood sugar once daily and as instructed. Dx E11.9   lisinopril (ZESTRIL) 40 MG tablet Take 1 tablet (40 mg total) by mouth daily.   metFORMIN (GLUCOPHAGE) 500 MG tablet TAKE 1 TO 2 TABLETS BY  MOUTH TWICE DAILY WITH  MEALS (Patient taking differently: Take 1,000 mg by mouth 2 (two) times  daily with a meal.)   metoprolol succinate (TOPROL-XL) 50 MG 24 hr tablet TAKE 1 TABLET BY MOUTH  DAILY WITH OR IMMEDIATELY  FOLLOWING A MEAL   Multiple Vitamin (MULTIVITAMIN) tablet Take 1 tablet by mouth daily.   omeprazole (PRILOSEC) 40 MG capsule Take 1 capsule (40 mg total) by mouth as needed. (Patient taking differently: Take 40 mg by mouth  daily.)   ONETOUCH DELICA LANCETS 24M MISC Check blood sugar once daily and as instructed. Dx E11.9   sitaGLIPtin (JANUVIA) 50 MG tablet Take 1 tablet (50 mg total) by mouth daily.   Turmeric 500 MG CAPS Take 2 capsules by mouth daily.   Vitamin D, Cholecalciferol, 1000 units CAPS Take 3,000 Units by mouth daily.   [DISCONTINUED] clonazePAM (KLONOPIN) 0.5 MG tablet Take 1 tablet (0.5 mg total) by mouth daily as needed for anxiety.   No facility-administered encounter medications on file as of 09/07/2021.    Allergies (verified) Cephalexin, Codeine, Inapsine [droperidol], Crestor [rosuvastatin], Lipitor [atorvastatin calcium], Niacin and related, Pravastatin, Adhesive [tape], and Latex   History: Past Medical History:  Diagnosis Date   Allergy    Anemia    "all the time as a child"   Anxiety    Arthritis    "all my joints; worse in my hands" (10/27/2015)   Chronic lower back pain    Depression    Diverticulosis    Fatty liver    GERD (gastroesophageal reflux disease)    Heart murmur    "dx'd years ago; very mild; never treated" (10/27/2015)   Hyperlipidemia    Hypertension    Hypothyroidism 2002-~ 2010   Iritis    per [place]    "stopped when I went thru menopause"   OSA on CPAP    Pneumonia ~ 2013; 10/27/2015   Recurrent urinary tract infection    Routine general medical examination at a health care facility 02/26/2014   Snores    Stroke ([place])    Type II diabetes mellitus ([place])    "lost weight; started exercising again; don't have it anymore" (10/27/2015)   Urinary incontinence    Wears glasses    Past Surgical History:  Procedure Laterality Date   BLEPHAROPLASTY Bilateral 2013; 2016   BREAST LUMPECTOMY Right 1964   benign tumor,   HEMORRHOID BANDING  X 2   KNEE ARTHROSCOPY Left 2016   "meniscus repair"   PELVIC FLOOR REPAIR  2001   "lift"   POLYPECTOMY  X 3   "bladder"   SHOULDER ARTHROSCOPY W/ ROTATOR CUFF REPAIR Left 2013   SHOULDER  ARTHROSCOPY W/ ROTATOR CUFF REPAIR Right 2015   TUBAL LIGATION  ~ 1986   VAGINAL HYSTERECTOMY  1992   Partial   Family History  Problem Relation Age of Onset   Arthritis Mother    Heart disease Mother    Hyperlipidemia Mother    Hypertension Mother    Diabetes Mother    Depression Mother    Stroke Mother    Alcohol abuse Father    Lung cancer Father    Arthritis Maternal Grandmother    Stroke Maternal Grandmother    Diabetes Maternal Grandmother    Heart disease Paternal Uncle        x 2   Arthritis Sister    Breast cancer Cousin    Heart disease Paternal Aunt    Diabetes Paternal Uncle    Diabetes Sister    Heart disease Paternal Uncle  x 5   Heart disease Paternal Aunt        x 3   Stroke Paternal Aunt    Dementia Paternal Grandmother    Colon cancer Neg Hx    Social History   Socioeconomic History   Marital status: Divorced    Spouse name: Not on file   Number of children: 1   Years of education: Not on file   Highest education level: Not on file  Occupational History   Occupation: Dental Receptionist/Assistant    Comment: Dr. Quillian Quince in [place]  Tobacco Use   Smoking status: Former    Packs/day: 2.00    Years: 5.00    Pack years: 10.00    Types: Cigarettes    Quit date: 09/12/1985    Years since quitting: 36.0   Smokeless tobacco: Never  Vaping Use   Vaping Use: Never used  Substance and Sexual Activity   Alcohol use: Not Currently   Drug use: No   Sexual activity: Yes  Other Topics Concern   Not on file  Social History Narrative   Education:  Futures trader   Divorced and lives with her brother.     Caring for her grandson Glennon Mac) as of 2022   Social Determinants of Health   Financial Resource Strain: Low Risk    Difficulty of Paying Living Expenses: Not very hard  Food Insecurity: No Food Insecurity   Worried About Charity fundraiser in the Last Year: Never true   Arboriculturist in the Last Year: Never true   Transportation Needs: No Transportation Needs   Lack of Transportation (Medical): No   Lack of Transportation (Non-Medical): No  Physical Activity: Inactive   Days of Exercise per Week: 0 days   Minutes of Exercise per Session: 0 min  Stress: No Stress Concern Present   Feeling of Stress : Not at all  Social Connections: Moderately Isolated   Frequency of Communication with Friends and Family: More than three times a week   Frequency of Social Gatherings with Friends and Family: More than three times a week   Attends Religious Services: More than 4 times per year   Active Member of [employer] or Organizations: No   Attends Music therapist: Never   Marital Status: Divorced    Tobacco Counseling Counseling given: Not Answered   Clinical Intake:  Pre-visit preparation completed: Yes  Pain : No/denies pain     BMI - recorded: 26.78 Nutritional Status: BMI 25 -29 Overweight Nutritional Risks: None Diabetes: Yes CBG done?: No Did pt. bring in CBG monitor from home?: No  How often do you need to have someone help you when you read instructions, pamphlets, or other written materials from your doctor or pharmacy?: 1 - Never  Diabetes:  Is the patient diabetic?  Yes  If diabetic, was a CBG obtained today?  No , visit completed over the phone.  Did the patient bring in their glucometer from home?  No  How often do you monitor your CBG's? No monitioring.   Financial Strains and Diabetes Management:  Are you having any financial strains with the device, your supplies or your medication? No .  Does the patient want to be seen by Chronic Care Management for management of their diabetes?  No  Would the patient like to be referred to a Nutritionist or for Diabetic Management?  No   Diabetic Exams:  Diabetic Eye Exam: Completed 05/11/21.    Diabetic  Foot Exam: Completed 12/11/20.   Interpreter Needed?: No  Information entered by :: Orrin Brigham LPN   Activities  of Daily Living In your present state of health, do you have any difficulty performing the following activities: 09/07/2021  Hearing? Y  Vision? N  Difficulty concentrating or making decisions? N  Walking or climbing stairs? Y  Comment occasionally  Dressing or bathing? N  Doing errands, shopping? N  Preparing Food and eating ? N  Using the Toilet? N  In the past six months, have you accidently leaked urine? Y  Comment after urination  Do you have problems with loss of bowel control? N  Managing your Medications? N  Managing your Finances? N  Housekeeping or managing your Housekeeping? N  Some recent data might be hidden    Patient Care Team: Tonia Ghent, MD as PCP - General (Family Medicine) Josue Hector, MD as PCP - Cardiology (Cardiology) Debbora Dus, [facility] as Pharmacist (Pharmacist) Clarene Essex, MD as Consulting Physician (Gastroenterology)  Indicate any recent Medical Services you may have received from other than [facility] providers in the past year (date may be approximate).     Assessment:   This is a routine wellness examination for [employer].  Hearing/Vision screen Hearing Screening - Comments:: Wears hearing aids Vision Screening - Comments:: Last exam 05/11/21, Dr. Thomasene Ripple, wears glasses  Dietary issues and exercise activities discussed: Current Exercise Habits: The patient does not participate in regular exercise at present   Goals Addressed             This Visit's Progress    Patient Stated       Would like to drink more water and lose weight.        Depression Screen PHQ 2/9 Scores 09/07/2021 05/03/2021 07/28/2020 06/27/2019 06/08/2018 11/11/2016 11/11/2016  PHQ - 2 Score 0 0 0 0 0 2 2  PHQ- 9 Score - - - 0 - 13 13    Fall Risk Fall Risk  09/07/2021 05/03/2021 07/28/2020 06/27/2019 06/08/2018  Falls in the past year? 0 0 0 0 No  Number falls in past yr: 0 - 0 - -  Injury with Fall? 0 - 0 - -  Risk for fall due to : No Fall Risks - -  Medication side effect -  Follow up Falls prevention discussed - Falls evaluation completed Falls evaluation completed;Falls prevention discussed -    FALL RISK PREVENTION PERTAINING TO THE HOME:  Any stairs in or around the home? Yes  If so, are there any without handrails? No  Home free of loose throw rugs in walkways, pet beds, electrical cords, etc? Yes  Adequate lighting in your home to reduce risk of falls? Yes   ASSISTIVE DEVICES UTILIZED TO PREVENT FALLS:  Life alert? No  Use of a cane, walker or w/c? No  Grab bars in the bathroom? Yes  Shower chair or bench in shower? Yes  Elevated toilet seat or a handicapped toilet? No   TIMED UP AND GO:  Was the test performed? No , visit completed over the phone.    Cognitive Function: Normal cognitive status assessed by this Nurse Health Advisor. No abnormalities found.   MMSE - Mini Mental State Exam 06/27/2019 11/11/2016  Orientation to time 5 5  Orientation to Place 5 5  Registration 3 3  Attention/ Calculation 5 0  Recall 3 3  Language- name 2 objects - 0  Language- repeat 1 1  Language- follow 3 step command - 3  Language- read & follow direction - 0  Write a sentence - 0  Copy design - 0  Total score - 20        Immunizations Immunization History  Administered Date(s) Administered   Fluad Quad(high Dose 65+) 05/31/2019, 06/22/2020, 06/07/2021   Hepatitis B, adult 07/19/2013, 08/27/2013, 04/04/2014   Influenza, High Dose Seasonal PF 08/01/2018   Influenza,inj,Quad PF,6+ Mos 07/25/2014   Influenza-Unspecified 05/14/2015, 05/13/2016, 06/08/2017   Moderna Covid-19 Vaccine Bivalent Booster 44yr & up 07/01/2021   PFIZER(Purple Top)SARS-COV-2 Vaccination 10/08/2019, 10/29/2019, 06/22/2020   Pneumococcal Conjugate-13 02/26/2014   Pneumococcal Polysaccharide-23 05/06/2016   Tdap 06/25/2012   Zoster, Live 09/10/2012    TDAP status: Up to date  Flu Vaccine status: Up to date  Pneumococcal vaccine status: Up to  date  Covid-19 vaccine status: Completed vaccines  Qualifies for Shingles Vaccine? Yes   Zostavax completed Yes   Shingrix Completed?: No.    Education has been provided regarding the importance of this vaccine. Patient has been advised to call insurance company to determine out of pocket expense if they have not yet received this vaccine. Advised may also receive vaccine at local pharmacy or Health Dept. Verbalized acceptance and understanding.  Screening Tests Health Maintenance  Topic Date Due   Zoster Vaccines- Shingrix (1 of 2) Never done   MAMMOGRAM  12/11/2020   HEMOGLOBIN A1C  11/16/2021   FOOT EXAM  12/11/2021   OPHTHALMOLOGY EXAM  05/11/2022   TETANUS/TDAP  06/25/2022   COLONOSCOPY (Pts 45-44yrInsurance coverage will need to be confirmed)  04/16/2025   Pneumonia Vaccine 689Years old  Completed   INFLUENZA VACCINE  Completed   DEXA SCAN  Completed   COVID-19 Vaccine  Completed   Hepatitis C Screening  Completed   HPV VACCINES  Aged Out    Health Maintenance  Health Maintenance Due  Topic Date Due   Zoster Vaccines- Shingrix (1 of 2) Never done   MAMMOGRAM  12/11/2020    Colorectal cancer screening: Type of screening: Colonoscopy. Completed 04/17/15. Repeat every 10 years  Mammogram status: patient states mammogram is current and will have report sent to PCP  Bone Density status: patient states mammogram is current and will have report sent to PCP  Lung Cancer Screening: (Low Dose CT Chest recommended if Age 71-80ears, 30 pack-year currently smoking OR have quit w/in 15years.) does not qualify.     Additional Screening:  Hepatitis C Screening: does qualify; Completed 04/28/15  Vision Screening: Recommended annual ophthalmology exams for early detection of glaucoma and other disorders of the eye. Is the patient up to date with their annual eye exam?  Yes  Who is the provider or what is the name of the office in which the patient attends annual eye exams? Dr.  DiThomasene Ripple Dental Screening: Recommended annual dental exams for proper oral hygiene  Community Resource Referral / Chronic Care Management: CRR required this visit?  No   CCM required this visit?  No      Plan:     I have personally reviewed and noted the following in the patients chart:   Medical and social history Use of alcohol, tobacco or illicit drugs  Current medications and supplements including opioid prescriptions.  Functional ability and status Nutritional status Physical activity Advanced directives List of other physicians Hospitalizations, surgeries, and ER visits in previous 12 months Vitals Screenings to include cognitive, depression, and falls Referrals and appointments  In addition, I have reviewed and discussed with patient  certain preventive protocols, quality metrics, and best practice recommendations. A written personalized care plan for preventive services as well as general preventive health recommendations were provided to patient.   Due to this being a telephonic visit, the after visit summary with patients personalized plan was offered to patient via mail or my-chart. Patient would like to access on my-chart.    Loma Messing, LPN   [id number]   Nurse Health Advisor  Nurse Notes: none

## 2021-09-03 ENCOUNTER — Telehealth: Payer: Self-pay

## 2021-09-03 MED ORDER — CLONAZEPAM 0.5 MG PO TABS: 0.5000 mg | ORAL_TABLET | Freq: Every day | ORAL | 1 refills | Status: DC | PRN

## 2021-09-03 NOTE — Telephone Encounter (Signed)
Sent. Thanks.   

## 2021-09-03 NOTE — Addendum Note (Signed)
Addended by: Tonia Ghent on: 09/03/2021 10:56 AM   Modules accepted: Orders

## 2021-09-03 NOTE — Telephone Encounter (Signed)
Refill request for Clonazepam 0.5 mg tablets  LOV - 06/07/21 Next OV - 09/17/21 Last refill - 01/31/21 #30/1

## 2021-09-07 ENCOUNTER — Ambulatory Visit (INDEPENDENT_AMBULATORY_CARE_PROVIDER_SITE_OTHER): Payer: Medicare Other

## 2021-09-07 VITALS — Ht 67.0 in | Wt 171.0 lb

## 2021-09-07 DIAGNOSIS — Z0000 Encounter for general adult medical examination without abnormal findings: Secondary | ICD-10-CM

## 2021-09-07 NOTE — Patient Instructions (Addendum)
Allison Yoder , Thank you for taking time to complete your Medicare Wellness Visit. I appreciate your ongoing commitment to your health goals. Please review the following plan we discussed and let me know if I can assist you in the future.   Screening recommendations/referrals: Colonoscopy: up to date, completed 04/17/15, due 04/16/25 Mammogram: due, last completed 12/12/18, per our conversation have update report sent to office. Bone Density: due, last completed 09/12/16, per our conversation have update report sent to office. Recommended yearly ophthalmology/optometry visit for glaucoma screening and checkup Recommended yearly dental visit for hygiene and checkup  Vaccinations: Influenza vaccine: up to date Pneumococcal vaccine: up to date Tdap vaccine: up to date, completed 06/25/12  due 06/25/22 Shingles vaccine: Discuss with your local pharmacy Covid-19:up to date  Advanced directives: Information available at your next appointment  Conditions/risks identified: see problem list  Next appointment: Follow up in one year for your annual wellness visit 09/09/22 @ 3:30pm , this will be a telephone visit.   Preventive Care 45 Years and Older, Female Preventive care refers to lifestyle choices and visits with your health care provider that can promote health and wellness. What does preventive care include? A yearly physical exam. This is also called an annual well check. Dental exams once or twice a year. Routine eye exams. Ask your health care provider how often you should have your eyes checked. Personal lifestyle choices, including: Daily care of your teeth and gums. Regular physical activity. Eating a healthy diet. Avoiding tobacco and drug use. Limiting alcohol use. Practicing safe sex. Taking low-dose aspirin every day. Taking vitamin and mineral supplements as recommended by your health care provider. What happens during an annual well check? The services and screenings done by your  health care provider during your annual well check will depend on your age, overall health, lifestyle risk factors, and family history of disease. Counseling  Your health care provider may ask you questions about your: Alcohol use. Tobacco use. Drug use. Emotional well-being. Home and relationship well-being. Sexual activity. Eating habits. History of falls. Memory and ability to understand (cognition). Work and work Statistician. Reproductive health. Screening  You may have the following tests or measurements: Height, weight, and BMI. Blood pressure. Lipid and cholesterol levels. These may be checked every 5 years, or more frequently if you are over 34 years old. Skin check. Lung cancer screening. You may have this screening every year starting at age 71 if you have a 30-pack-year history of smoking and currently smoke or have quit within the past 15 years. Fecal occult blood test (FOBT) of the stool. You may have this test every year starting at age 71. Flexible sigmoidoscopy or colonoscopy. You may have a sigmoidoscopy every 5 years or a colonoscopy every 10 years starting at age 71. Hepatitis C blood test. Hepatitis B blood test. Sexually transmitted disease (STD) testing. Diabetes screening. This is done by checking your blood sugar (glucose) after you have not eaten for a while (fasting). You may have this done every 1-3 years. Bone density scan. This is done to screen for osteoporosis. You may have this done starting at age 71. Mammogram. This may be done every 1-2 years. Talk to your health care provider about how often you should have regular mammograms. Talk with your health care provider about your test results, treatment options, and if necessary, the need for more tests. Vaccines  Your health care provider may recommend certain vaccines, such as: Influenza vaccine. This is recommended every year. Tetanus,  diphtheria, and acellular pertussis (Tdap, Td) vaccine. You may  need a Td booster every 10 years. Zoster vaccine. You may need this after age 71. Pneumococcal 13-valent conjugate (PCV13) vaccine. One dose is recommended after age 71. Pneumococcal polysaccharide (PPSV23) vaccine. One dose is recommended after age 71. Talk to your health care provider about which screenings and vaccines you need and how often you need them. This information is not intended to replace advice given to you by your health care provider. Make sure you discuss any questions you have with your health care provider. Document Released: 09/25/2015 Document Revised: 05/18/2016 Document Reviewed: 06/30/2015 Elsevier Interactive Patient Education  2017 [place] Prevention in the Home Falls can cause injuries. They can happen to people of all ages. There are many things you can do to make your home safe and to help prevent falls. What can I do on the outside of my home? Regularly fix the edges of walkways and driveways and fix any cracks. Remove anything that might make you trip as you walk through a door, such as a raised step or threshold. Trim any bushes or trees on the path to your home. Use bright outdoor lighting. Clear any walking paths of anything that might make someone trip, such as rocks or tools. Regularly check to see if handrails are loose or broken. Make sure that both sides of any steps have handrails. Any raised decks and porches should have guardrails on the edges. Have any leaves, snow, or ice cleared regularly. Use sand or salt on walking paths during winter. Clean up any spills in your garage right away. This includes oil or grease spills. What can I do in the bathroom? Use night lights. Install grab bars by the toilet and in the tub and shower. Do not use towel bars as grab bars. Use non-skid mats or decals in the tub or shower. If you need to sit down in the shower, use a plastic, non-slip stool. Keep the floor dry. Clean up any water that spills on  the floor as soon as it happens. Remove soap buildup in the tub or shower regularly. Attach bath mats securely with double-sided non-slip rug tape. Do not have throw rugs and other things on the floor that can make you trip. What can I do in the bedroom? Use night lights. Make sure that you have a light by your bed that is easy to reach. Do not use any sheets or blankets that are too big for your bed. They should not hang down onto the floor. Have a firm chair that has side arms. You can use this for support while you get dressed. Do not have throw rugs and other things on the floor that can make you trip. What can I do in the kitchen? Clean up any spills right away. Avoid walking on wet floors. Keep items that you use a lot in easy-to-reach places. If you need to reach something above you, use a strong step stool that has a grab bar. Keep electrical cords out of the way. Do not use floor polish or wax that makes floors slippery. If you must use wax, use non-skid floor wax. Do not have throw rugs and other things on the floor that can make you trip. What can I do with my stairs? Do not leave any items on the stairs. Make sure that there are handrails on both sides of the stairs and use them. Fix handrails that are broken or loose. Make  sure that handrails are as long as the stairways. Check any carpeting to make sure that it is firmly attached to the stairs. Fix any carpet that is loose or worn. Avoid having throw rugs at the top or bottom of the stairs. If you do have throw rugs, attach them to the floor with carpet tape. Make sure that you have a light switch at the top of the stairs and the bottom of the stairs. If you do not have them, ask someone to add them for you. What else can I do to help prevent falls? Wear shoes that: Do not have high heels. Have rubber bottoms. Are comfortable and fit you well. Are closed at the toe. Do not wear sandals. If you use a stepladder: Make sure  that it is fully opened. Do not climb a closed stepladder. Make sure that both sides of the stepladder are locked into place. Ask someone to hold it for you, if possible. Clearly mark and make sure that you can see: Any grab bars or handrails. First and last steps. Where the edge of each step is. Use tools that help you move around (mobility aids) if they are needed. These include: Canes. Walkers. Scooters. Crutches. Turn on the lights when you go into a dark area. Replace any light bulbs as soon as they burn out. Set up your furniture so you have a clear path. Avoid moving your furniture around. If any of your floors are uneven, fix them. If there are any pets around you, be aware of where they are. Review your medicines with your doctor. Some medicines can make you feel dizzy. This can increase your chance of falling. Ask your doctor what other things that you can do to help prevent falls. This information is not intended to replace advice given to you by your health care provider. Make sure you discuss any questions you have with your health care provider. Document Released: 06/25/2009 Document Revised: 02/04/2016 Document Reviewed: 10/03/2014 Elsevier Interactive Patient Education  2017 [employer].

## 2021-09-17 ENCOUNTER — Ambulatory Visit: Payer: Medicare Other | Admitting: Family Medicine

## 2021-10-04 DIAGNOSIS — N760 Acute vaginitis: Secondary | ICD-10-CM | POA: Diagnosis not present

## 2021-10-13 DIAGNOSIS — Z1159 Encounter for screening for other viral diseases: Secondary | ICD-10-CM | POA: Diagnosis not present

## 2021-10-13 DIAGNOSIS — K7581 Nonalcoholic steatohepatitis (NASH): Secondary | ICD-10-CM | POA: Diagnosis not present

## 2021-10-13 DIAGNOSIS — K7460 Unspecified cirrhosis of liver: Secondary | ICD-10-CM | POA: Diagnosis not present

## 2021-10-27 DIAGNOSIS — E7800 Pure hypercholesterolemia, unspecified: Secondary | ICD-10-CM | POA: Diagnosis not present

## 2021-10-27 DIAGNOSIS — I69311 Memory deficit following cerebral infarction: Secondary | ICD-10-CM | POA: Diagnosis not present

## 2021-10-27 DIAGNOSIS — Z881 Allergy status to other antibiotic agents status: Secondary | ICD-10-CM | POA: Diagnosis not present

## 2021-10-27 DIAGNOSIS — Z885 Allergy status to narcotic agent status: Secondary | ICD-10-CM | POA: Diagnosis not present

## 2021-10-27 DIAGNOSIS — I10 Essential (primary) hypertension: Secondary | ICD-10-CM | POA: Diagnosis not present

## 2021-10-27 DIAGNOSIS — K7460 Unspecified cirrhosis of liver: Secondary | ICD-10-CM | POA: Diagnosis not present

## 2021-10-27 DIAGNOSIS — K219 Gastro-esophageal reflux disease without esophagitis: Secondary | ICD-10-CM | POA: Diagnosis not present

## 2021-10-27 DIAGNOSIS — G4733 Obstructive sleep apnea (adult) (pediatric): Secondary | ICD-10-CM | POA: Diagnosis not present

## 2021-10-27 DIAGNOSIS — Z87891 Personal history of nicotine dependence: Secondary | ICD-10-CM | POA: Diagnosis not present

## 2021-10-27 DIAGNOSIS — E119 Type 2 diabetes mellitus without complications: Secondary | ICD-10-CM | POA: Diagnosis not present

## 2021-10-27 DIAGNOSIS — I69398 Other sequelae of cerebral infarction: Secondary | ICD-10-CM | POA: Diagnosis not present

## 2021-10-27 DIAGNOSIS — Z7984 Long term (current) use of oral hypoglycemic drugs: Secondary | ICD-10-CM | POA: Diagnosis not present

## 2021-10-27 DIAGNOSIS — K7581 Nonalcoholic steatohepatitis (NASH): Secondary | ICD-10-CM | POA: Diagnosis not present

## 2021-11-09 DIAGNOSIS — J342 Deviated nasal septum: Secondary | ICD-10-CM | POA: Diagnosis not present

## 2021-11-09 DIAGNOSIS — G4733 Obstructive sleep apnea (adult) (pediatric): Secondary | ICD-10-CM | POA: Diagnosis not present

## 2021-11-09 DIAGNOSIS — J343 Hypertrophy of nasal turbinates: Secondary | ICD-10-CM | POA: Diagnosis not present

## 2021-11-16 ENCOUNTER — Ambulatory Visit (INDEPENDENT_AMBULATORY_CARE_PROVIDER_SITE_OTHER): Payer: Medicare Other | Admitting: Family Medicine

## 2021-11-16 ENCOUNTER — Other Ambulatory Visit: Payer: Self-pay

## 2021-11-16 ENCOUNTER — Encounter: Payer: Self-pay | Admitting: Family Medicine

## 2021-11-16 VITALS — BP 126/70 | HR 62 | Temp 97.1°F | Ht 67.0 in | Wt 212.0 lb

## 2021-11-16 DIAGNOSIS — Z23 Encounter for immunization: Secondary | ICD-10-CM

## 2021-11-16 DIAGNOSIS — R945 Abnormal results of liver function studies: Secondary | ICD-10-CM | POA: Insufficient documentation

## 2021-11-16 DIAGNOSIS — Z659 Problem related to unspecified psychosocial circumstances: Secondary | ICD-10-CM

## 2021-11-16 DIAGNOSIS — K5732 Diverticulitis of large intestine without perforation or abscess without bleeding: Secondary | ICD-10-CM | POA: Insufficient documentation

## 2021-11-16 DIAGNOSIS — K760 Fatty (change of) liver, not elsewhere classified: Secondary | ICD-10-CM

## 2021-11-16 DIAGNOSIS — A09 Infectious gastroenteritis and colitis, unspecified: Secondary | ICD-10-CM | POA: Insufficient documentation

## 2021-11-16 DIAGNOSIS — E119 Type 2 diabetes mellitus without complications: Secondary | ICD-10-CM

## 2021-11-16 DIAGNOSIS — K921 Melena: Secondary | ICD-10-CM | POA: Insufficient documentation

## 2021-11-16 LAB — POCT GLYCOSYLATED HEMOGLOBIN (HGB A1C): Hemoglobin A1C: 6.9 % — AB (ref 4.0–5.6)

## 2021-11-16 MED ORDER — METFORMIN HCL 500 MG PO TABS: 1000.0000 mg | ORAL_TABLET | Freq: Two times a day (BID) | ORAL | Status: DC

## 2021-11-16 MED ORDER — SITAGLIPTIN PHOSPHATE 50 MG PO TABS: 50.0000 mg | ORAL_TABLET | Freq: Every day | ORAL | 3 refills | Status: DC

## 2021-11-16 MED ORDER — OMEPRAZOLE 40 MG PO CPDR: 40.0000 mg | DELAYED_RELEASE_CAPSULE | ORAL | 3 refills | Status: DC | PRN

## 2021-11-16 MED ORDER — CITALOPRAM HYDROBROMIDE 20 MG PO TABS: 20.0000 mg | ORAL_TABLET | Freq: Every day | ORAL | 3 refills | Status: DC

## 2021-11-16 MED ORDER — LISINOPRIL 40 MG PO TABS: 40.0000 mg | ORAL_TABLET | Freq: Every day | ORAL | 3 refills | Status: DC

## 2021-11-16 MED ORDER — ONETOUCH DELICA LANCETS 33G MISC: 3 refills | Status: DC

## 2021-11-16 MED ORDER — GLUCOSE BLOOD VI STRP: ORAL_STRIP | 3 refills | Status: DC

## 2021-11-16 MED ORDER — METOPROLOL SUCCINATE ER 50 MG PO TB24: ORAL_TABLET | ORAL | 3 refills | Status: DC

## 2021-11-16 MED ORDER — METFORMIN HCL 500 MG PO TABS: 1000.0000 mg | ORAL_TABLET | Freq: Two times a day (BID) | ORAL | 3 refills | Status: DC

## 2021-11-16 MED ORDER — CLONAZEPAM 0.5 MG PO TABS: 0.5000 mg | ORAL_TABLET | Freq: Every day | ORAL | 1 refills | Status: AC | PRN

## 2021-11-16 NOTE — Patient Instructions (Addendum)
HAV and HBV vaccine today, dose #1.  ?2nd dose due in 1 month at a nurse visit, after April 7th ?3rd dose in 6 months, after Sep 7th.   ? ?Plan on recheck in about 3 months.  A1c at the visit.   ? ?Thanks for your effort.  ?

## 2021-11-16 NOTE — Progress Notes (Signed)
This visit occurred during the SARS-CoV-2 public health emergency.  Safety protocols were in place, including screening questions prior to the visit, additional usage of staff PPE, and extensive cleaning of exam room while observing appropriate contact time as indicated for disinfecting solutions. ? ?She had to take out a restraining order on her son.  His whereabouts are not known.  D/w pt.  She is safe at home.   ? ?Diabetes:  ?Using medications without difficulties: yes ?Hypoglycemic episodes: no sx ?Hyperglycemic episodes: no sx ?Feet problems: no ?Blood Sugars averaging: not checked often.   ?eye exam within last year: yes ?A1c 6.9. she is working on diet and exercise.   ? ?We talked about getting HAV and HBV vaccine series done. Prev labs at [facility] d/w pt.   ? ?Meds, vitals, and allergies reviewed.  ?ROS: Per HPI unless specifically indicated in ROS section  ? ?GEN: nad, alert and oriented ?HEENT: ncat ?NECK: supple w/o LA ?CV: rrr. ?PULM: ctab, no inc wob ?ABD: soft, +bs ?EXT: no edema ?SKIN: well perfused.   ?

## 2021-11-17 NOTE — Assessment & Plan Note (Signed)
?  We talked about getting HAV and HBV vaccine series done. Prev labs at [facility] d/w pt.   See avs.   ? ?HAV and HBV vaccine today, dose #1.  ?2nd dose due in 1 month at a nurse visit, after April 7th ?3rd dose in 6 months, after Sep 7th.   ?

## 2021-11-17 NOTE — Assessment & Plan Note (Signed)
Condolences offered, see above.  She'll update me as needed.   ?

## 2021-11-17 NOTE — Assessment & Plan Note (Signed)
Continue metformin and Januvia.  Continue work on diet and exercise.  Recheck periodically.  See avs.  She agrees.  A1c dw pt at OV.  ?

## 2021-12-01 ENCOUNTER — Telehealth: Payer: Self-pay | Admitting: Primary Care

## 2021-12-01 ENCOUNTER — Other Ambulatory Visit: Payer: Self-pay

## 2021-12-01 ENCOUNTER — Ambulatory Visit: Payer: Medicare Other | Admitting: Primary Care

## 2021-12-01 VITALS — BP 116/84 | HR 87 | Wt 212.0 lb

## 2021-12-01 DIAGNOSIS — G4730 Sleep apnea, unspecified: Secondary | ICD-10-CM

## 2021-12-01 DIAGNOSIS — R0683 Snoring: Secondary | ICD-10-CM | POA: Diagnosis not present

## 2021-12-01 DIAGNOSIS — G4733 Obstructive sleep apnea (adult) (pediatric): Secondary | ICD-10-CM

## 2021-12-01 NOTE — Progress Notes (Signed)
? ?'@Patient'  ID: Allison Yoder, female    DOB: 11/26/1950, 71 y.o.   MRN: [id number] ? ?No chief complaint on file. ? ? ?Referring provider: ?Jerrell Belfast, MD ? ?HPI: ?71 year old female, former smoker quit 1987 (10-pack-year history).  Past medical history significant for obstructive sleep apnea, hypertension, migraines, deviated septum, type 2 diabetes.   ? ?12/01/2021 ?Patient presents today for sleep consult. Former patient of Dr. Gwenette Greet, she had been ordered for home sleep study back in 2015 but did not want to pay out of pocket cost at that time for testing. She had a split night sleep study in February 2021, AHI >21. Started on CPAP. She continues to have symptoms of loud snoring, restless sleep and daytime sleepiness. She is complaint with CPAP. Using nasal pillow mask. CPAP machine is >6-7 years. She is having issues with pressure. Unhappy with [employer], would like to switch DME companies. She reports frequent nocturnal awakenings. She gets drowsey easily. She gets tired mid-afternoon. She is raising her 77 year old grandson. Her weight is up 40 lbs since 2015. She has a hx nash cirrhosis, following with GI and Dr. Reather Laurence with [place]. She is working on weight loss and exercising.  ? ?Sleep questionnaire ?Symptoms-  Snoring   ?Prior sleep study- 2016  ?Bedtime- 9pm ?Time to fall asleep- 10-21mns  ?Nocturnal awakenings- twice  ?Out of bed/start of day- 6-6:30am  ?Weight changes- weight has fluctuated  ?Do you operate heavy machinery- No ?Do you currently wear CPAP- Yes  ?Do you current wear oxygen- No ?[place]worth- 8 ? ?Airview download 10/14/2016 ?30/30 days used > 4 hours ?Average usage 7 hours 44 mins ?Pressure 12cm h20 ?Airleaks 29.5L/min (95%) ?AHI 0.9  ? ? ?Allergies  ?Allergen Reactions  ? Cephalexin Itching  ?  Does tolerate augmentin  ? Codeine Itching and Rash  ? Inapsine [Droperidol] Shortness Of Breath, Anxiety and Hypertension  ?  Elevated HR and BP, panic attack  ? Crestor [Rosuvastatin]   ?  Aches  ?  Lipitor [Atorvastatin Calcium]   ?  Myalgias ?  ? Niacin And Related   ?  Flushing  ? Pravastatin   ?  Myalgias  ? Adhesive [Tape] Rash  ?  Blisters with tape  ? Latex Rash  ? ? ?Immunization History  ?Administered Date(s) Administered  ? Fluad Quad(high Dose 65+) 05/31/2019, 06/22/2020, 06/07/2021  ? Hep A / Hep B 11/16/2021  ? Hepatitis B, adult 07/19/2013, 08/27/2013, 04/04/2014  ? Influenza, High Dose Seasonal PF 08/01/2018  ? Influenza,inj,Quad PF,6+ Mos 07/25/2014  ? Influenza-Unspecified 05/14/2015, 05/13/2016, 06/08/2017  ? Moderna Covid-19 Vaccine Bivalent Booster 110yr& up 07/01/2021  ? PFIZER(Purple Top)SARS-COV-2 Vaccination 10/08/2019, 10/29/2019, 06/22/2020  ? Pneumococcal Conjugate-13 02/26/2014  ? Pneumococcal Polysaccharide-23 05/06/2016  ? Tdap 06/25/2012  ? Zoster, Live 09/10/2012  ? ? ?Past Medical History:  ?Diagnosis Date  ? Allergy   ? Anemia   ? "all the time as a child"  ? Anxiety   ? Arthritis   ? "all my joints; worse in my hands" (10/27/2015)  ? Chronic lower back pain   ? Depression   ? Diverticulosis   ? Fatty liver   ? GERD (gastroesophageal reflux disease)   ? Heart murmur   ? "dx'd years ago; very mild; never treated" (10/27/2015)  ? Hyperlipidemia   ? Hypertension   ? Hypothyroidism 2002-~ 2010  ? [facility]   ? [facility] Migraines   ? "stopped when I went thru menopause"  ?  OSA on CPAP   ? Pneumonia ~ 2013; 10/27/2015  ? Recurrent urinary tract infection   ? Routine general medical examination at a health care facility 02/26/2014  ? Snores   ? Stroke [facility])   ? Type II diabetes mellitus ([place])   ? "lost weight; started exercising again; don't have it anymore" (10/27/2015)  ? Urinary incontinence   ? Wears glasses   ? ? ?Tobacco History: ?Social History  ? ?Tobacco Use  ?Smoking Status Former  ? Packs/day: 2.00  ? Years: 5.00  ? Pack years: 10.00  ? Types: Cigarettes  ? Quit date: 09/12/1985  ? Years since quitting: 36.2  ?Smokeless Tobacco Never  ? ?Counseling given: Not  Answered ? ? ?Outpatient Medications Prior to Visit  ?Medication Sig Dispense Refill  ? albuterol (VENTOLIN HFA) 108 (90 Base) MCG/ACT inhaler INHALE 1 TO 2 PUFFS INTO THE LUNGS EVERY 6 HOURS AS NEEDED FOR WHEEZING OR SHORTNESS OF BREATH 25.5 g 1  ? Alirocumab (PRALUENT) 150 MG/ML SOAJ INJECT 1 PEN INTO THE SKIN EVERY 14 DAYS 2 mL 11  ? Blood Glucose Monitoring Suppl (ONE TOUCH ULTRA 2) w/Device KIT Check blood sugar once daily and as instructed. Dx E11.9 1 each 0  ? citalopram (CELEXA) 20 MG tablet Take 1 tablet (20 mg total) by mouth daily. 90 tablet 3  ? clonazePAM (KLONOPIN) 0.5 MG tablet Take 1 tablet (0.5 mg total) by mouth daily as needed for anxiety. 30 tablet 1  ? cyanocobalamin 1000 MCG tablet Take 1,000 mcg by mouth daily.    ? cyclobenzaprine (FLEXERIL) 10 MG tablet Take 1 tablet (10 mg total) by mouth daily as needed for muscle spasms. 90 tablet 3  ? fluticasone (FLONASE) 50 MCG/ACT nasal spray Place 1 spray into both nostrils daily as needed for allergies or rhinitis.    ? glucose blood (ONE TOUCH ULTRA TEST) test strip Check blood sugar once daily and as instructed. Dx E11.9 100 each 3  ? lisinopril (ZESTRIL) 40 MG tablet Take 1 tablet (40 mg total) by mouth daily. 90 tablet 3  ? metFORMIN (GLUCOPHAGE) 500 MG tablet Take 2 tablets (1,000 mg total) by mouth 2 (two) times daily with a meal. 360 tablet 3  ? metoprolol succinate (TOPROL-XL) 50 MG 24 hr tablet TAKE 1 TABLET BY MOUTH  DAILY WITH OR IMMEDIATELY  FOLLOWING A MEAL 90 tablet 3  ? Multiple Vitamin (MULTIVITAMIN) tablet Take 1 tablet by mouth daily.    ? omeprazole (PRILOSEC) 40 MG capsule Take 1 capsule (40 mg total) by mouth as needed. 90 capsule 3  ? OneTouch Delica Lancets 96D MISC Check blood sugar once daily and as instructed. Dx E11.9 100 each 3  ? sitaGLIPtin (JANUVIA) 50 MG tablet Take 1 tablet (50 mg total) by mouth daily. 90 tablet 3  ? Turmeric 500 MG CAPS Take 2 capsules by mouth daily.    ? Vitamin D, Cholecalciferol, 1000 units  CAPS Take 3,000 Units by mouth daily.    ? ?No facility-administered medications prior to visit.  ? ? ? ? ?Review of Systems ? ?Review of Systems ? ? ?Physical Exam ? ?BP 116/84   Pulse 87   Wt 212 lb (96.2 kg)   SpO2 95%   BMI 33.20 kg/m?  ?Physical Exam  ? ?Lab Results: ? ?CBC ?   ?Component Value Date/Time  ? WBC 8.2 08/02/2021 1121  ? RBC 4.65 08/02/2021 1121  ? HGB 13.7 08/02/2021 1121  ? HCT 42.4 08/02/2021 1121  ? PLT  143 (L) 08/02/2021 1121  ? MCV 91.2 08/02/2021 1121  ? MCH 29.5 08/02/2021 1121  ? MCHC 32.3 08/02/2021 1121  ? RDW 13.5 08/02/2021 1121  ? LYMPHSABS 1.9 05/19/2021 1134  ? MONOABS 0.4 05/19/2021 1134  ? EOSABS 0.1 05/19/2021 1134  ? BASOSABS 0.0 05/19/2021 1134  ? ? ?BMET ?   ?Component Value Date/Time  ? NA 139 05/19/2021 1134  ? K 3.9 05/19/2021 1134  ? CL 101 05/19/2021 1134  ? CO2 27 05/19/2021 1134  ? GLUCOSE 160 (H) 05/19/2021 1134  ? BUN 15 05/19/2021 1134  ? CREATININE 0.66 05/19/2021 1134  ? CREATININE 0.78 03/26/2021 1630  ? CALCIUM 9.6 05/19/2021 1134  ? GFRNONAA >60 04/01/2021 1104  ? GFRAA >60 12/01/2016 1040  ? ? ?BNP ?No results found for: BNP ? ?ProBNP ?No results found for: PROBNP ? ?Imaging: ?No results found. ? ? ?Assessment & Plan:  ? ?Obstructive sleep apnea of adult ?- Split night sleep study in February 2016 AHI > 21. Met split night criteria. Started on CPAP at 12cm h20. Airview download shows 100% compliance with use. Average usage 7 hours 44 mins. Pressure 12 cmh20; residual AHI 0.9. Due for new CPAP machine. DME order placed, patient would like to change companies. No changes recommended. FU in 1 year or sooner if needed.  ? ? ?Martyn Ehrich, NP ?12/10/2021 ? ?

## 2021-12-01 NOTE — Patient Instructions (Addendum)
?Recommendations: ?Continue to wear CPAP every night while sleeping for 4-6 hour or longer ?Continue weight loss efforts  ?Do not drive if experiencing excessive daytime sleepiness of fatigue  ?  ?Orders: ?New CPAP machine at 12cm h20 ?Change DME to [facility]  ?  ?Follow-up: ?1 year with Beth NP or sooner if needed (call or email [place] in 2-4 weeks if you have not heard about getting new CPAP) ? ?CPAP and BIPAP Information ?CPAP and BIPAP are methods that use air pressure to keep your airways open and to help you breathe well. CPAP and BIPAP use different amounts of pressure. Your health care provider will tell you whether CPAP or BIPAP would be more helpful for you. ?CPAP stands for "continuous positive airway pressure." With CPAP, the amount of pressure stays the same while you breathe in (inhale) and out (exhale). ?BIPAP stands for "bi-level positive airway pressure." With BIPAP, the amount of pressure will be higher when you inhale and lower when you exhale. This allows you to take larger breaths. ?CPAP or BIPAP may be used in the hospital, or your health care provider may want you to use it at home. You may need to have a sleep study before your health care provider can order a machine for you to use at home. ?What are the advantages? ?CPAP or BIPAP can be helpful if you have: ?Sleep apnea. ?Chronic obstructive pulmonary disease (COPD). ?Heart failure. ?Medical conditions that cause muscle weakness, including muscular dystrophy or amyotrophic lateral sclerosis (ALS). ?Other problems that cause breathing to be shallow, weak, abnormal, or difficult. ?CPAP and BIPAP are most commonly used for obstructive sleep apnea (OSA) to keep the airways from collapsing when the muscles relax during sleep. ?What are the risks? ?Generally, this is a safe treatment. However, problems may occur, including: ?Irritated skin or skin sores if the mask does not fit properly. ?Dry or stuffy nose or nosebleeds. ?Dry mouth. ?Feeling gassy  or bloated. ?Sinus or lung infection if the equipment is not cleaned properly. ?When should CPAP or BIPAP be used? ?In most cases, the mask only needs to be worn during sleep. Generally, the mask needs to be worn throughout the night and during any daytime naps. People with certain medical conditions may also need to wear the mask at other times, such as when they are awake. Follow instructions from your health care provider about when to use the machine. ?What happens during CPAP or BIPAP? ?Both CPAP and BIPAP are provided by a small machine with a flexible plastic tube that attaches to a plastic mask that you wear. Air is blown through the mask into your nose or mouth. The amount of pressure that is used to blow the air can be adjusted on the machine. Your health care provider will set the pressure setting and help you find the best mask for you. ?Tips for using the mask ?Because the mask needs to be snug, some people feel trapped or closed-in (claustrophobic) when first using the mask. If you feel this way, you may need to get used to the mask. One way to do this is to hold the mask loosely over your nose or mouth and then gradually apply the mask more snugly. You can also gradually increase the amount of time that you use the mask. ?Masks are available in various types and sizes. If your mask does not fit well, talk with your health care provider about getting a different one. Some common types of masks include: ?Full face  masks, which fit over the mouth and nose. ?Nasal masks, which fit over the nose. ?Nasal pillow or prong masks, which fit into the nostrils. ?If you are using a mask that fits over your nose and you tend to breathe through your mouth, a chin strap may be applied to help keep your mouth closed. ?Use a skin barrier to protect your skin as told by your health care provider. ?Some CPAP and BIPAP machines have alarms that may sound if the mask comes off or develops a leak. ?If you have trouble with  the mask, it is very important that you talk with your health care provider about finding a way to make the mask easier to tolerate. Do not stop using the mask. There could be a negative impact on your health if you stop using the mask. ?Tips for using the machine ?Place your CPAP or BIPAP machine on a secure table or stand near an electrical outlet. ?Know where the on/off switch is on the machine. ?Follow instructions from your health care provider about how to set the pressure on your machine and when you should use it. ?Do not eat or drink while the CPAP or BIPAP machine is on. Food or fluids could get pushed into your lungs by the pressure of the CPAP or BIPAP. ?For home use, CPAP and BIPAP machines can be rented or purchased through home health care companies. Many different brands of machines are available. Renting a machine before purchasing may help you find out which particular machine works well for you. Your health insurance company may also decide which machine you may get. ?Keep the CPAP or BIPAP machine and attachments clean. Ask your health care provider for specific instructions. ?Check the humidifier if you have a dry stuffy nose or nosebleeds. Make sure it is working correctly. ?Follow these instructions at home: ?Take over-the-counter and prescription medicines only as told by your health care provider. Ask if you can take sinus medicine if your sinuses are blocked. ?Do not use any products that contain nicotine or tobacco. These products include cigarettes, chewing tobacco, and vaping devices, such as e-cigarettes. If you need help quitting, ask your health care provider. ?Keep all follow-up visits. This is important. ?Contact a health care provider if: ?You have redness or pressure sores on your head, face, mouth, or nose from the mask or head gear. ?You have trouble using the CPAP or BIPAP machine. ?You cannot tolerate wearing the CPAP or BIPAP mask. ?Someone tells you that you snore even when  wearing your CPAP or BIPAP. ?Get help right away if: ?You have trouble breathing. ?You feel confused. ?Summary ?CPAP and BIPAP are methods that use air pressure to keep your airways open and to help you breathe well. ?If you have trouble with the mask, it is very important that you talk with your health care provider about finding a way to make the mask easier to tolerate. Do not stop using the mask. There could be a negative impact to your health if you stop using the mask. ?Follow instructions from your health care provider about when to use the machine. ?This information is not intended to replace advice given to you by your health care provider. Make sure you discuss any questions you have with your health care provider. ?Document Revised: 04/07/2021 Document Reviewed: 08/07/2020 ?Elsevier Patient Education ? 2022 [place]. ? ? ?

## 2021-12-01 NOTE — Telephone Encounter (Signed)
Can we please call [employer] and ask to re-enroll patient to airview. Last download from 2018. If unable patient needs to bring in SD card for download.  ?

## 2021-12-01 NOTE — Telephone Encounter (Signed)
Community message sent to [employer] checking to see if pt can be reenrolled into [facility]. Will await response. ?

## 2021-12-02 ENCOUNTER — Institutional Professional Consult (permissible substitution): Payer: Medicare Other | Admitting: Primary Care

## 2021-12-02 NOTE — Telephone Encounter (Signed)
Response received from [employer]: ?New, Allison Yoder, Allison Yoder, [name]; Everlene Balls ?Hello,  ? ?I have requested air view add from out [facility] team. It looks like we took patient as a supply patient years ago. They will need to contact her and get the info from the unit, and most likely have it unlocked from the [place].  ? ?Thank you,  ? ?Brad New  ?

## 2021-12-10 NOTE — Assessment & Plan Note (Addendum)
-  Split night sleep study in February 2016 AHI > 21. Met split night criteria. Started on CPAP at 12cm h20. [facility]view download shows 100% compliance with use. Average usage 7 hours 44 mins. Pressure 12 cmh20; residual AHI 0.9. Due for new CPAP machine. DME order placed, patient would like to change companies. No changes recommended. FU in 1 year or sooner if needed.  ?

## 2021-12-24 ENCOUNTER — Telehealth: Payer: Self-pay | Admitting: Pharmacist

## 2021-12-24 MED ORDER — REPATHA SURECLICK 140 MG/ML ~~LOC~~ SOAJ: 1.0000 "pen " | SUBCUTANEOUS | 11 refills | Status: DC

## 2021-12-24 NOTE — Telephone Encounter (Signed)
I spoke with patient about her latex allergy. She states that its very mild. When she wears latex cloves all day, everyday for work her hands get red and dry. She is willing to try the [place]. ?This has been approved through her insurance as Nelida Meuse is now non-formulary. ?

## 2021-12-30 ENCOUNTER — Ambulatory Visit: Payer: Medicare Other

## 2022-01-04 ENCOUNTER — Ambulatory Visit (INDEPENDENT_AMBULATORY_CARE_PROVIDER_SITE_OTHER): Payer: Medicare Other

## 2022-01-04 DIAGNOSIS — Z23 Encounter for immunization: Secondary | ICD-10-CM

## 2022-01-04 NOTE — Progress Notes (Signed)
Pt came in today to receive her 2nd Hep A and Hep B vaccine , pt was given vaccine in the right deltoid w/o concerns. Pt will follow up in 6 month follow w/ final injection   ?

## 2022-01-05 DIAGNOSIS — N760 Acute vaginitis: Secondary | ICD-10-CM | POA: Diagnosis not present

## 2022-01-12 DIAGNOSIS — K760 Fatty (change of) liver, not elsewhere classified: Secondary | ICD-10-CM | POA: Diagnosis not present

## 2022-01-12 DIAGNOSIS — K7460 Unspecified cirrhosis of liver: Secondary | ICD-10-CM | POA: Diagnosis not present

## 2022-01-12 DIAGNOSIS — Z87891 Personal history of nicotine dependence: Secondary | ICD-10-CM | POA: Diagnosis not present

## 2022-01-12 DIAGNOSIS — K7581 Nonalcoholic steatohepatitis (NASH): Secondary | ICD-10-CM | POA: Diagnosis not present

## 2022-01-19 ENCOUNTER — Other Ambulatory Visit: Payer: Self-pay | Admitting: Family

## 2022-01-19 ENCOUNTER — Telehealth: Payer: Medicare Other | Admitting: Family

## 2022-01-19 ENCOUNTER — Encounter: Payer: Self-pay | Admitting: Family

## 2022-01-19 ENCOUNTER — Ambulatory Visit (INDEPENDENT_AMBULATORY_CARE_PROVIDER_SITE_OTHER)
Admission: RE | Admit: 2022-01-19 | Discharge: 2022-01-19 | Disposition: A | Discharge status: Home / Self Care | Payer: Medicare Other | Source: Ambulatory Visit | Attending: Family | Admitting: Family

## 2022-01-19 ENCOUNTER — Ambulatory Visit (INDEPENDENT_AMBULATORY_CARE_PROVIDER_SITE_OTHER): Payer: Medicare Other | Admitting: Family

## 2022-01-19 VITALS — BP 148/82 | HR 90 | Temp 99.1°F | Resp 16 | Ht 67.0 in | Wt 171.0 lb

## 2022-01-19 DIAGNOSIS — R062 Wheezing: Secondary | ICD-10-CM

## 2022-01-19 DIAGNOSIS — H65191 Other acute nonsuppurative otitis media, right ear: Secondary | ICD-10-CM | POA: Insufficient documentation

## 2022-01-19 MED ORDER — ALBUTEROL SULFATE (2.5 MG/3ML) 0.083% IN NEBU
2.5000 mg | INHALATION_SOLUTION | RESPIRATORY_TRACT | Status: AC
  Administered 2022-01-19: 2.5 mg via RESPIRATORY_TRACT

## 2022-01-19 MED ORDER — PREDNISONE 20 MG PO TABS: ORAL_TABLET | ORAL | 0 refills | Status: DC

## 2022-01-19 MED ORDER — ALBUTEROL SULFATE (2.5 MG/3ML) 0.083% IN NEBU: 2.5000 mg | INHALATION_SOLUTION | Freq: Four times a day (QID) | RESPIRATORY_TRACT | 1 refills | Status: DC | PRN

## 2022-01-19 MED ORDER — ALBUTEROL SULFATE HFA 108 (90 BASE) MCG/ACT IN AERS: INHALATION_SPRAY | RESPIRATORY_TRACT | 1 refills | Status: AC

## 2022-01-19 MED ORDER — NEBULIZER SYSTEM ALL-IN-ONE MISC: 1.0000 | 0 refills | Status: AC

## 2022-01-19 MED ORDER — AMOXICILLIN-POT CLAVULANATE 875-125 MG PO TABS: 1.0000 | ORAL_TABLET | Freq: Two times a day (BID) | ORAL | 0 refills | Status: DC

## 2022-01-19 NOTE — Assessment & Plan Note (Signed)
Take antibiotic as prescribed. Increase oral fluids. Pt to f/u if sx worsen and or fail to improve in 2-3 days. rx augmentin 875/125 mg po bid x 10 days  

## 2022-01-19 NOTE — Progress Notes (Signed)
? ?Established Patient Office Visit ? ?Subjective:  ?Patient ID: Allison Yoder, female    DOB: May 11, 1951  Age: 71 y.o. MRN: [id number] ? ?CC:  ?Chief Complaint  ?Patient presents with  ? Cough  ?  Neg for covid today  ? Shortness of Breath  ? ? ?HPI ?Sarea V Placke is here today with concerns.  ?Five days ago with upset stomach and nausea has since resolved.  ?Started sneezing, and had some coughing and congestion yesterday.  ?Stayed home from work today and feeling much better.  ? ?Also with some sob, worsening.  ?Feeling some wheezing in her chest.  ?With chest congestion.  ?Some sore throat, nasal congestion. Nose feels raw.  ?No fever, some chills.  ? ?Covid tested this am, negative. ?Last night took thera-flu allowed her to sleep. ? ?Past Medical History:  ?Diagnosis Date  ? Allergy   ? Anemia   ? "all the time as a child"  ? Anxiety   ? Arthritis   ? "all my joints; worse in my hands" (10/27/2015)  ? Chronic lower back pain   ? Depression   ? Diverticulosis   ? Fatty liver   ? GERD (gastroesophageal reflux disease)   ? Heart murmur   ? "dx'd years ago; very mild; never treated" (10/27/2015)  ? Hyperlipidemia   ? Hypertension   ? Hypothyroidism 2002-~ 2010  ? Iritis   ? per [facility]  ? Migraines   ? "stopped when I went thru menopause"  ? OSA on CPAP   ? Pneumonia ~ 2013; 10/27/2015  ? Recurrent urinary tract infection   ? Routine general medical examination at a health care facility 02/26/2014  ? Snores   ? Stroke [facility])   ? Type II diabetes mellitus ([place])   ? "lost weight; started exercising again; don't have it anymore" (10/27/2015)  ? Urinary incontinence   ? Wears glasses   ? ? ?Past Surgical History:  ?Procedure Laterality Date  ? BLEPHAROPLASTY Bilateral 2013; 2016  ? BREAST LUMPECTOMY Right 1964  ? benign tumor,  ? HEMORRHOID BANDING  X 2  ? KNEE ARTHROSCOPY Left 2016  ? "meniscus repair"  ? PELVIC FLOOR REPAIR  2001  ? "lift"  ? POLYPECTOMY  X 3  ? "bladder"  ? SHOULDER ARTHROSCOPY W/ ROTATOR  CUFF REPAIR Left 2013  ? SHOULDER ARTHROSCOPY W/ ROTATOR CUFF REPAIR Right 2015  ? TUBAL LIGATION  ~ 1986  ? VAGINAL HYSTERECTOMY  1992  ? Partial  ? ? ?Family History  ?Problem Relation Age of Onset  ? Arthritis Mother   ? Heart disease Mother   ? Hyperlipidemia Mother   ? Hypertension Mother   ? Diabetes Mother   ? Depression Mother   ? Stroke Mother   ? Alcohol abuse Father   ? Lung cancer Father   ? Arthritis Maternal Grandmother   ? Stroke Maternal Grandmother   ? Diabetes Maternal Grandmother   ? Heart disease Paternal Uncle   ?     x 2  ? Arthritis Sister   ? Breast cancer Cousin   ? Heart disease Paternal Aunt   ? Diabetes Paternal Uncle   ? Diabetes Sister   ? Heart disease Paternal Uncle   ?     x 5  ? Heart disease Paternal Aunt   ?     x 3  ? Stroke Paternal Aunt   ? Dementia Paternal Grandmother   ? Colon cancer Neg Hx   ? ? ?  Social History  ? ?Socioeconomic History  ? Marital status: Divorced  ?  Spouse name: Not on file  ? Number of children: 1  ? Years of education: Not on file  ? Highest education level: Not on file  ?Occupational History  ? Occupation: Dental Receptionist/Assistant  ?  Comment: Dr. Quillian Quince in [place]  ?Tobacco Use  ? Smoking status: Former  ?  Packs/day: 2.00  ?  Years: 5.00  ?  Pack years: 10.00  ?  Types: Cigarettes  ?  Quit date: 09/12/1985  ?  Years since quitting: 36.3  ? Smokeless tobacco: Never  ?Vaping Use  ? Vaping Use: Never used  ?Substance and Sexual Activity  ? Alcohol use: Not Currently  ? Drug use: No  ? Sexual activity: Yes  ?Other Topics Concern  ? Not on file  ?Social History Narrative  ? Education:  BA  ? Dental assistant  ? Divorced and lives with her brother.    ? Caring for her grandson Glennon Mac) as of 2023  ? ?Social Determinants of Health  ? ?Financial Resource Strain: Low Risk   ? Difficulty of Paying Living Expenses: Not very hard  ?Food Insecurity: No Food Insecurity  ? Worried About Charity fundraiser in the Last Year: Never true  ? Ran Out of Food in  the Last Year: Never true  ?Transportation Needs: No Transportation Needs  ? Lack of Transportation (Medical): No  ? Lack of Transportation (Non-Medical): No  ?Physical Activity: Inactive  ? Days of Exercise per Week: 0 days  ? Minutes of Exercise per Session: 0 min  ?Stress: No Stress Concern Present  ? Feeling of Stress : Not at all  ?Social Connections: Moderately Isolated  ? Frequency of Communication with Friends and Family: More than three times a week  ? Frequency of Social Gatherings with Friends and Family: More than three times a week  ? Attends Religious Services: More than 4 times per year  ? Active Member of Clubs or Organizations: No  ? Attends Archivist Meetings: Never  ? Marital Status: Divorced  ?Intimate Partner Violence: Not At Risk  ? Fear of Current or Ex-Partner: No  ? Emotionally Abused: No  ? Physically Abused: No  ? Sexually Abused: No  ? ? ?Outpatient Medications Prior to Visit  ?Medication Sig Dispense Refill  ? Blood Glucose Monitoring Suppl (ONE TOUCH ULTRA 2) w/Device KIT Check blood sugar once daily and as instructed. Dx E11.9 1 each 0  ? citalopram (CELEXA) 20 MG tablet Take 1 tablet (20 mg total) by mouth daily. 90 tablet 3  ? clonazePAM (KLONOPIN) 0.5 MG tablet Take 1 tablet (0.5 mg total) by mouth daily as needed for anxiety. 30 tablet 1  ? cyanocobalamin 1000 MCG tablet Take 1,000 mcg by mouth daily.    ? cyclobenzaprine (FLEXERIL) 10 MG tablet Take 1 tablet (10 mg total) by mouth daily as needed for muscle spasms. 90 tablet 3  ? Evolocumab (REPATHA SURECLICK) 616 MG/ML SOAJ Inject 1 pen. into the skin every 14 (fourteen) days. 2 mL 11  ? fluticasone (FLONASE) 50 MCG/ACT nasal spray Place 1 spray into both nostrils daily as needed for allergies or rhinitis.    ? glucose blood (ONE TOUCH ULTRA TEST) test strip Check blood sugar once daily and as instructed. Dx E11.9 100 each 3  ? lisinopril (ZESTRIL) 40 MG tablet Take 1 tablet (40 mg total) by mouth daily. 90 tablet 3  ?  metFORMIN (GLUCOPHAGE) 500 MG  tablet Take 2 tablets (1,000 mg total) by mouth 2 (two) times daily with a meal. 360 tablet 3  ? metoprolol succinate (TOPROL-XL) 50 MG 24 hr tablet TAKE 1 TABLET BY MOUTH  DAILY WITH OR IMMEDIATELY  FOLLOWING A MEAL 90 tablet 3  ? Multiple Vitamin (MULTIVITAMIN) tablet Take 1 tablet by mouth daily.    ? omeprazole (PRILOSEC) 40 MG capsule Take 1 capsule (40 mg total) by mouth as needed. 90 capsule 3  ? OneTouch Delica Lancets 44P MISC Check blood sugar once daily and as instructed. Dx E11.9 100 each 3  ? sitaGLIPtin (JANUVIA) 50 MG tablet Take 1 tablet (50 mg total) by mouth daily. 90 tablet 3  ? Turmeric 500 MG CAPS Take 2 capsules by mouth daily.    ? Vitamin D, Cholecalciferol, 1000 units CAPS Take 3,000 Units by mouth daily.    ? albuterol (VENTOLIN HFA) 108 (90 Base) MCG/ACT inhaler INHALE 1 TO 2 PUFFS INTO THE LUNGS EVERY 6 HOURS AS NEEDED FOR WHEEZING OR SHORTNESS OF BREATH 25.5 g 1  ? ?No facility-administered medications prior to visit.  ? ? ?Allergies  ?Allergen Reactions  ? Cephalexin Itching  ?  Does tolerate augmentin  ? Codeine Itching and Rash  ? Inapsine [Droperidol] Shortness Of Breath, Anxiety and Hypertension  ?  Elevated HR and BP, panic attack  ? Crestor [Rosuvastatin]   ?  Aches  ? Lipitor [Atorvastatin Calcium]   ?  Myalgias ?  ? Niacin And Related   ?  Flushing  ? Pravastatin   ?  Myalgias  ? Adhesive [Tape] Rash  ?  Blisters with tape  ? Latex Rash  ? ? ?ROS ?Review of Systems  ?Constitutional:  Positive for fatigue. Negative for chills and fever.  ?HENT:  Positive for congestion, ear pain (left ear fullness), postnasal drip (nose feels raw) and sore throat. Negative for rhinorrhea, sinus pressure, sinus pain and sneezing.   ?Eyes:  Negative for discharge.  ?Respiratory:  Positive for cough, chest tightness, shortness of breath and wheezing.   ?Cardiovascular:  Negative for chest pain and palpitations.  ? ?  ?Objective:  ?  ?Physical Exam ?Constitutional:   ?    General: She is not in acute distress. ?   Appearance: Normal appearance. She is well-developed. She is obese. She is ill-appearing. She is not toxic-appearing or diaphoretic.  ?HENT:  ?   Right Ear

## 2022-01-19 NOTE — Patient Instructions (Addendum)
Recommend mucinex over the counter  ?Start augmenting ?Pending chest xray , may add doxycycline if pneumonia ? ?Complete xray(s) prior to leaving today. I will notify you of your results once received. ? ?Antibiotic sent to preferred pharmacy.  ? ?Please increase oral fluids, steamy hot shower/humidifier prn. ? ?Please follow up if no improvement in 2-3 days.  ? ?It was a pleasure seeing you today! Please do not hesitate to reach out with any questions and or concerns. ? ?Regards,  ? ?Daesia Zylka ? ?

## 2022-01-19 NOTE — Assessment & Plan Note (Addendum)
nebulizer treatment in office, pt tolerated well. Relief slightly of chest tightness post. ?rx prednisone 20 mg taper  ?Worried about pneumonia, chest xray stat today.  ?

## 2022-02-01 DIAGNOSIS — G4733 Obstructive sleep apnea (adult) (pediatric): Secondary | ICD-10-CM | POA: Diagnosis not present

## 2022-02-17 ENCOUNTER — Ambulatory Visit (INDEPENDENT_AMBULATORY_CARE_PROVIDER_SITE_OTHER): Payer: Medicare Other | Admitting: Family Medicine

## 2022-02-17 ENCOUNTER — Encounter: Payer: Self-pay | Admitting: Family Medicine

## 2022-02-17 VITALS — BP 136/78 | HR 71 | Temp 96.4°F | Resp 12 | Ht 67.0 in | Wt 174.0 lb

## 2022-02-17 DIAGNOSIS — G720 Drug-induced myopathy: Secondary | ICD-10-CM | POA: Diagnosis not present

## 2022-02-17 DIAGNOSIS — E119 Type 2 diabetes mellitus without complications: Secondary | ICD-10-CM

## 2022-02-17 DIAGNOSIS — R059 Cough, unspecified: Secondary | ICD-10-CM

## 2022-02-17 LAB — POCT GLYCOSYLATED HEMOGLOBIN (HGB A1C): Hemoglobin A1C: 7.7 % — AB (ref 4.0–5.6)

## 2022-02-17 LAB — GLUCOSE, POCT (MANUAL RESULT ENTRY): POC Glucose: 152 mg/dl — AB (ref 70–99)

## 2022-02-17 NOTE — Patient Instructions (Signed)
Don't change your meds for now.  Thanks for your effort.  Update me as needed.   I suspect your sugars will improve.   Plan on yearly recheck in the fall (Sept vs Oct), labs at the visit.  Take care.  Glad to see you.

## 2022-02-17 NOTE — Progress Notes (Signed)
Diabetes:  Using medications without difficulties: yes, metformin and januvia.   Hypoglycemic episodes:not since moving januvia to PM.   Hyperglycemic episodes:no Feet problems: occ stinging on the feet, not constant.   Blood Sugars averaging: 96-170s eye exam within last year: yes A1c discussed with patient at office visit.  She and I were both expecting it to be slightly higher than her previous values given her previous illness.  Statin intolerant.   Prev resp sx d/w pt.  She still has change in taste and smell.  She finally feels some better.  Unclear if she had covid, d/w pt.   Here today with Glennon Mac and she is going to update me about her situation caring for him via mychart.  She is safe at home.  Stress eating d/w pt.  She is in counseling.  Insomnia noted.  Diet and exercise affected by stressors.    Meds, vitals, and allergies reviewed.   ROS: Per HPI unless specifically indicated in ROS section   GEN: nad, alert and oriented HEENT: ncat NECK: supple w/o LA CV: rrr. PULM: ctab, no inc wob ABD: soft, +bs EXT: no edema SKIN: Well-perfused.  Diabetic foot exam: Normal inspection No skin breakdown No calluses  Normal DP pulses Normal sensation to light touch and monofilament Nails normal

## 2022-02-18 DIAGNOSIS — G720 Drug-induced myopathy: Secondary | ICD-10-CM | POA: Insufficient documentation

## 2022-02-18 NOTE — Assessment & Plan Note (Signed)
Statin intolerant 

## 2022-02-18 NOTE — Assessment & Plan Note (Signed)
Cough resolved.  Lungs clear.  Update me as needed.

## 2022-02-18 NOTE — Assessment & Plan Note (Signed)
A1c is slightly higher but still reasonable, especially given recent events No change in medication in the meantime since we both expect her sugars to gradually improve.  Continue metformin and Januvia.  Plan on yearly recheck in the fall (Sept vs Oct), labs at the visit.  Statin intolerant.

## 2022-02-28 ENCOUNTER — Encounter: Payer: Self-pay | Admitting: Family Medicine

## 2022-03-04 DIAGNOSIS — G4733 Obstructive sleep apnea (adult) (pediatric): Secondary | ICD-10-CM | POA: Diagnosis not present

## 2022-04-03 DIAGNOSIS — G4733 Obstructive sleep apnea (adult) (pediatric): Secondary | ICD-10-CM | POA: Diagnosis not present

## 2022-05-03 DIAGNOSIS — G4733 Obstructive sleep apnea (adult) (pediatric): Secondary | ICD-10-CM | POA: Diagnosis not present

## 2022-05-24 ENCOUNTER — Ambulatory Visit: Payer: Medicare Other

## 2022-05-25 DIAGNOSIS — N952 Postmenopausal atrophic vaginitis: Secondary | ICD-10-CM | POA: Diagnosis not present

## 2022-05-25 DIAGNOSIS — Z1231 Encounter for screening mammogram for malignant neoplasm of breast: Secondary | ICD-10-CM | POA: Diagnosis not present

## 2022-05-25 DIAGNOSIS — N760 Acute vaginitis: Secondary | ICD-10-CM | POA: Diagnosis not present

## 2022-05-25 DIAGNOSIS — Z1272 Encounter for screening for malignant neoplasm of vagina: Secondary | ICD-10-CM | POA: Diagnosis not present

## 2022-05-25 DIAGNOSIS — Z1151 Encounter for screening for human papillomavirus (HPV): Secondary | ICD-10-CM | POA: Diagnosis not present

## 2022-05-25 DIAGNOSIS — Z01419 Encounter for gynecological examination (general) (routine) without abnormal findings: Secondary | ICD-10-CM | POA: Diagnosis not present

## 2022-05-30 ENCOUNTER — Other Ambulatory Visit: Payer: Self-pay | Admitting: Obstetrics and Gynecology

## 2022-05-30 DIAGNOSIS — R928 Other abnormal and inconclusive findings on diagnostic imaging of breast: Secondary | ICD-10-CM

## 2022-06-02 ENCOUNTER — Telehealth (INDEPENDENT_AMBULATORY_CARE_PROVIDER_SITE_OTHER): Payer: Medicare Other | Admitting: Family Medicine

## 2022-06-02 VITALS — BP 143/85 | HR 89 | Ht 66.5 in | Wt 171.0 lb

## 2022-06-02 DIAGNOSIS — U071 COVID-19: Secondary | ICD-10-CM

## 2022-06-02 MED ORDER — MOLNUPIRAVIR EUA 200MG CAPSULE: 4.0000 | ORAL_CAPSULE | Freq: Two times a day (BID) | ORAL | 0 refills | Status: AC

## 2022-06-02 MED ORDER — BENZONATATE 100 MG PO CAPS: ORAL_CAPSULE | ORAL | 0 refills | Status: DC

## 2022-06-02 NOTE — Progress Notes (Signed)
Virtual Visit via Video Note  I connected with Allison Yoder  on 06/02/22 at 10:20 AM EDT by a video enabled telemedicine application and verified that I am speaking with the correct person using two identifiers.  Location patient: [place] Location provider:work or home office Persons participating in the virtual visit: patient, provider  I discussed the limitations and requested verbal permission for telemedicine visit. The patient expressed understanding and agreed to proceed.   HPI:  Acute telemedicine visit for Covid19: -Onset: yesterday -Symptoms include: fatigue, cough,  congestion, body aches -Denies: CP, SOB, fever, NVD -Has tried: OTC cough medication -drinking fluids -Pertinent past medical history: see below, doesn't think had covid in the past - but not sure, eGFR 68 in feb, reports has stage 4 liver disease per recent biopsy -Pertinent medication allergies: Allergies  Allergen Reactions   Cephalexin Itching    Does tolerate augmentin   Codeine Itching and Rash   Inapsine [Droperidol] Shortness Of Breath, Anxiety and Hypertension    Elevated HR and BP, panic attack   Crestor [Rosuvastatin]     Aches   Lipitor [Atorvastatin Calcium]     Myalgias    Niacin And Related     Flushing   Pravastatin     Myalgias   Adhesive [Tape] Rash    Blisters with tape   Latex Rash  -COVID-19 vaccine status: she has had multiple doses and boosters Immunization History  Administered Date(s) Administered   Fluad Quad(high Dose 65+) 05/31/2019, 06/22/2020, 06/07/2021   Hep A / Hep B 11/16/2021, 01/04/2022   Hepatitis B, adult 07/19/2013, 08/27/2013, 04/04/2014   Hepb-cpg 07/19/2013, 08/27/2013, 04/04/2014   Influenza, High Dose Seasonal PF 08/01/2018   Influenza,inj,Quad PF,6+ Mos 07/25/2014   Influenza-Unspecified 05/14/2015, 05/13/2016, 06/08/2017   Moderna Covid-19 Vaccine Bivalent Booster 29yr & up 07/01/2021   PFIZER(Purple Top)SARS-COV-2 Vaccination 10/08/2019, 10/29/2019,  06/22/2020   Pneumococcal Conjugate-13 02/26/2014   Pneumococcal Polysaccharide-23 05/06/2016   Tdap 06/25/2012   Zoster, Live 09/10/2012     ROS: See pertinent positives and negatives per HPI.  Past Medical History:  Diagnosis Date   Allergy    Anemia    "all the time as a child"   Anxiety    Arthritis    "all my joints; worse in my hands" (10/27/2015)   Chronic lower back pain    Depression    Diverticulosis    Fatty liver    GERD (gastroesophageal reflux disease)    Heart murmur    "dx'd years ago; very mild; never treated" (10/27/2015)   Hyperlipidemia    Hypertension    Hypothyroidism 2002-~ 2010   [facility]  Migraines    "stopped when I went thru menopause"   OSA on CPAP    Pneumonia ~ 2013; 10/27/2015   Recurrent urinary tract infection    Routine general medical examination at a health care facility 02/26/2014   Snores    Stroke ([place]    Type II diabetes mellitus ([place]    "lost weight; started exercising again; don't have it anymore" (10/27/2015)   Urinary incontinence    Wears glasses     Past Surgical History:  Procedure Laterality Date   BLEPHAROPLASTY Bilateral 2013; 2016   BREAST LUMPECTOMY Right 1964   benign tumor,   HEMORRHOID BANDING  X 2   KNEE ARTHROSCOPY Left 2016   "meniscus repair"   PELVIC FLOOR REPAIR  2001   "lift"   POLYPECTOMY  X 3   "bladder"  SHOULDER ARTHROSCOPY W/ ROTATOR CUFF REPAIR Left 2013   SHOULDER ARTHROSCOPY W/ ROTATOR CUFF REPAIR Right 2015   TUBAL LIGATION  ~ 1986   VAGINAL HYSTERECTOMY  1992   Partial     Current Outpatient Medications:    albuterol (VENTOLIN HFA) 108 (90 Base) MCG/ACT inhaler, INHALE 1 TO 2 PUFFS INTO THE LUNGS EVERY 6 HOURS AS NEEDED FOR WHEEZING OR SHORTNESS OF BREATH, Disp: 25.5 g, Rfl: 1   benzonatate (TESSALON PERLES) 100 MG capsule, 1 capsule up to twice daily as needed for cough, Disp: 30 capsule, Rfl: 0   Blood Glucose Monitoring Suppl (ONE TOUCH ULTRA 2)  w/Device KIT, Check blood sugar once daily and as instructed. Dx E11.9, Disp: 1 each, Rfl: 0   citalopram (CELEXA) 20 MG tablet, Take 1 tablet (20 mg total) by mouth daily., Disp: 90 tablet, Rfl: 3   clonazePAM (KLONOPIN) 0.5 MG tablet, Take 1 tablet (0.5 mg total) by mouth daily as needed for anxiety., Disp: 30 tablet, Rfl: 1   cyanocobalamin 1000 MCG tablet, Take 1,000 mcg by mouth daily., Disp: , Rfl:    cyclobenzaprine (FLEXERIL) 10 MG tablet, Take 1 tablet (10 mg total) by mouth daily as needed for muscle spasms., Disp: 90 tablet, Rfl: 3   Evolocumab (REPATHA SURECLICK) 256 MG/ML SOAJ, Inject 1 pen. into the skin every 14 (fourteen) days., Disp: 2 mL, Rfl: 11   glucose blood (ONE TOUCH ULTRA TEST) test strip, Check blood sugar once daily and as instructed. Dx E11.9, Disp: 100 each, Rfl: 3   lisinopril (ZESTRIL) 40 MG tablet, Take 1 tablet (40 mg total) by mouth daily., Disp: 90 tablet, Rfl: 3   metFORMIN (GLUCOPHAGE) 500 MG tablet, Take 2 tablets (1,000 mg total) by mouth 2 (two) times daily with a meal., Disp: 360 tablet, Rfl: 3   metoprolol succinate (TOPROL-XL) 50 MG 24 hr tablet, TAKE 1 TABLET BY MOUTH  DAILY WITH OR IMMEDIATELY  FOLLOWING A MEAL, Disp: 90 tablet, Rfl: 3   molnupiravir EUA (LAGEVRIO) 200 mg CAPS capsule, Take 4 capsules (800 mg total) by mouth 2 (two) times daily for 5 days., Disp: 40 capsule, Rfl: 0   Multiple Vitamin (MULTIVITAMIN) tablet, Take 1 tablet by mouth daily., Disp: , Rfl:    omeprazole (PRILOSEC) 40 MG capsule, Take 1 capsule (40 mg total) by mouth as needed., Disp: 90 capsule, Rfl: 3   OneTouch Delica Lancets 38L MISC, Check blood sugar once daily and as instructed. Dx E11.9, Disp: 100 each, Rfl: 3   sitaGLIPtin (JANUVIA) 50 MG tablet, Take 1 tablet (50 mg total) by mouth daily., Disp: 90 tablet, Rfl: 3   Turmeric 500 MG CAPS, Take 2 capsules by mouth daily., Disp: , Rfl:    Vitamin D, Cholecalciferol, 1000 units CAPS, Take 3,000 Units by mouth daily., Disp: ,  Rfl:    albuterol (PROVENTIL) (2.5 MG/3ML) 0.083% nebulizer solution, Take 3 mLs (2.5 mg total) by nebulization every 6 (six) hours as needed for wheezing or shortness of breath. (Patient not taking: Reported on 06/02/2022), Disp: 150 mL, Rfl: 1   fluticasone (FLONASE) 50 MCG/ACT nasal spray, Place 1 spray into both nostrils daily as needed for allergies or rhinitis. (Patient not taking: Reported on 06/02/2022), Disp: , Rfl:   EXAM:  VITALS per patient if applicable: Today's Vitals   06/02/22 1023  BP: (!) 143/85  Pulse: 89  Weight: 171 lb (77.6 kg)  Height: 5' 6.5" (1.689 m)   Body mass index is 27.19 kg/m.   GENERAL:  alert, oriented, appears well and in no acute distress  HEENT: atraumatic, conjunttiva clear, no obvious abnormalities on inspection of external nose and ears  NECK: normal movements of the head and neck  LUNGS: on inspection no signs of respiratory distress, breathing rate appears normal, no obvious gross SOB, gasping or wheezing  CV: no obvious cyanosis  MS: moves all visible extremities without noticeable abnormality  PSYCH/NEURO: pleasant and cooperative, no obvious depression or anxiety, speech and thought processing grossly intact  ASSESSMENT AND PLAN:  Discussed the following assessment and plan:  COVID-19   Discussed treatment options, side effect and risk of drug interactions, ideal treatment window, potential complications, isolation and precautions for COVID-19.  Discussed possibility of rebound with or without antivirals. Checked for/reviewed last GFR - listed in HPI if available. After lengthy discussion, the patient opted for treatment with legevrio due to being higher risk for complications of covid or severe disease and other factors. Discussed preliminary limited knowledge of risks/interactions/side effects per EUA document vs possible benefits and precautions. This information was shared with patient during the visit and also was provided in  patient instructions.  The patient did want a prescription for cough, Tessalon Rx sent.  Other symptomatic care measures summarized in patient instructions. Advised to seek prompt virtual visit or in person care if worsening, new symptoms arise, or if is not improving with treatment as expected per our conversation of expected course. Discussed options for follow up care. Did let this patient know that I do telemedicine on Tuesdays and Thursdays for [facility] and those are the days I am logged into the system. Advised to schedule follow up visit with PCP, [facility] virtual visits or [facility] if any further questions or concerns to avoid delays in care.   I discussed the assessment and treatment plan with the patient. The patient was provided an opportunity to ask questions and all were answered. The patient agreed with the plan and demonstrated an understanding of the instructions.     Lucretia Kern, DO

## 2022-06-02 NOTE — Patient Instructions (Signed)
HOME CARE TIPS:  -I sent the medication(s) we discussed to your pharmacy: Meds ordered this encounter  Medications   molnupiravir EUA (LAGEVRIO) 200 mg CAPS capsule    Sig: Take 4 capsules (800 mg total) by mouth 2 (two) times daily for 5 days.    Dispense:  40 capsule    Refill:  0   benzonatate (TESSALON PERLES) 100 MG capsule    Sig: 1 capsule up to twice daily as needed for cough    Dispense:  30 capsule    Refill:  0     -I sent in the [facility] treatment or referral you requested per our discussion. Please see the information provided below and discuss further with the pharmacist/treatment team.    -there is a chance of rebound illness with covid after improving. This can happen whether or not you take an antiviral treatment. If you become sick again with covid after getting better, please schedule a follow up virtual visit and isolate again.   -analgesics per liver specialist recommendations  -nasal saline sinus rinses twice daily  -stay hydrated, drink plenty of fluids and eat small healthy meals - avoid dairy  -follow up with your doctor in 2-3 days unless improving and feeling better  -stay home while sick, except to seek medical care. If you have COVID19, you will likely be contagious for 7-10 days. Flu or Influenza is likely contagious for about 7 days. Other respiratory viral infections remain contagious for 5-10+ days depending on the virus and many other factors. Wear a good mask that fits snugly (such as N95 or KN95) if around others to reduce the risk of transmission.  It was nice to meet you today, and I really hope you are feeling better soon. I help [facility] out with telemedicine visits on Tuesdays and Thursdays and am happy to help if you need a follow up virtual visit on those days. Otherwise, if you have any concerns or questions following this visit please schedule a follow up visit with your Primary Care doctor or seek care at a local urgent care clinic to  avoid delays in care.    Seek in person care or schedule a follow up video visit promptly if your symptoms worsen, new concerns arise or you are not improving with treatment. Call 911 and/or seek emergency care if your symptoms are severe or life threatening.  PLEASE SEE THE FOLLOWING LINK FOR THE MOST UPDATED INFORMATION ABOUT LAGEVRIO:  [email or web address]      Fact Sheet for Patients And Caregivers Emergency Use Authorization (EUA) Of LAGEVRIOT (molnupiravir) capsules For Coronavirus Disease 2019 (COVID-19)  What is the most important information I should know about LAGEVRIO? LAGEVRIO may cause serious side effects, including: ? LAGEVRIO may cause harm to your unborn baby. It is not known if LAGEVRIO will harm your baby if you take LAGEVRIO during pregnancy. o LAGEVRIO is not recommended for use in pregnancy. o LAGEVRIO has not been studied in pregnancy. LAGEVRIO was studied in pregnant animals only. When LAGEVRIO was given to pregnant animals, LAGEVRIO caused harm to their unborn babies. o You and your healthcare provider may decide that you should take LAGEVRIO during pregnancy if there are no other COVID-19 treatment options approved or authorized by the FDA that are accessible or clinically appropriate for you. o If you and your healthcare provider decide that you should take LAGEVRIO during pregnancy, you and your healthcare provider should discuss the known and potential benefits and the potential risks of taking LAGEVRIO during  pregnancy. For individuals who are able to become pregnant: ? You should use a reliable method of birth control (contraception) consistently and correctly during treatment with LAGEVRIO and for 4 days after the last dose of LAGEVRIO. Talk to your healthcare provider about reliable birth control methods. ? Before starting treatment with [facility] your healthcare provider may do a pregnancy test to see if you are pregnant before starting  treatment with LAGEVRIO. ? Tell your healthcare provider right away if you become pregnant or think you may be pregnant during treatment with LAGEVRIO. Pregnancy Surveillance Program: ? There is a pregnancy surveillance program for individuals who take LAGEVRIO during pregnancy. The purpose of this program is to collect information about the health of you and your baby. Talk to your healthcare provider about how to take part in this program. ? If you take LAGEVRIO during pregnancy and you agree to participate in the pregnancy surveillance program and allow your healthcare provider to share your information with [place], then your healthcare provider will report your use of [place] during pregnancy to [place]. by calling [phone] or [email or web address]. For individuals who are sexually active with partners who are able to become pregnant: ? It is not known if LAGEVRIO can affect sperm. While the risk is regarded as low, animal studies to fully assess the potential for LAGEVRIO to affect the babies of males treated with LAGEVRIO have not been completed. A reliable method of birth control (contraception) should be used consistently and correctly during treatment with LAGEVRIO and for at least 3 months after the last dose. The risk to sperm beyond 3 months is not known. Studies to understand the risk to sperm beyond 3 months are ongoing. Talk to your healthcare provider about reliable birth control methods. Talk to your healthcare provider if you have questions or concerns about how LAGEVRIO may affect sperm. You are being given this fact sheet because your healthcare provider believes it is necessary to provide you with LAGEVRIO for the treatment of adults with mild-to-moderate coronavirus disease 2019 (COVID-19) with positive results of direct SARS-CoV-2 viral testing, and who are at high risk for progression to severe COVID-19 including  hospitalization or death, and for whom other COVID-19 treatment options approved or authorized by the FDA are not accessible or clinically appropriate. The U.S. Food and Drug Administration (FDA) has issued an Emergency Use Authorization (EUA) to make LAGEVRIO available during the COVID-19 pandemic (for more details about an EUA please see "What is an Emergency Use Authorization?" at the end of this document). LAGEVRIO is not an FDA-approved medicine in the [place]. Read this Fact Sheet for information about LAGEVRIO. Talk to your healthcare provider about your options if you have any questions. It is your choice to take LAGEVRIO.  What is COVID-19? COVID-19 is caused by a virus called a coronavirus. You can get COVID-19 through close contact with another person who has the virus. COVID-19 illnesses have ranged from very mild-to-severe, including illness resulting in death. While information so far suggests that most COVID-19 illness is mild, serious illness can happen and may cause some of your other medical conditions to become worse. Older people and people of all ages with severe, long lasting (chronic) medical conditions like heart disease, lung disease and diabetes, for example seem to be at higher risk of being hospitalized for COVID-19.  What is LAGEVRIO? LAGEVRIO is an investigational medicine used to treat mild-to-moderate COVID-19 in adults: ? with positive results of direct  SARS-CoV-2 viral testing, and ? who are at high risk for progression to severe COVID-19 including hospitalization or death, and for whom other COVID-19 treatment options approved or authorized by the FDA are not accessible or clinically appropriate. The FDA has authorized the emergency use of LAGEVRIO for the treatment of mild-tomoderate COVID-19 in adults under an EUA. For more information on EUA, see the "What is an Emergency Use Authorization (EUA)?" section at the end of this Fact Sheet. LAGEVRIO  is not authorized: ? for use in people less than 57 years of age. ? for prevention of COVID-19. ? for people needing hospitalization for COVID-19. ? for use for longer than 5 consecutive days.  What should I tell my healthcare provider before I take LAGEVRIO? Tell your healthcare provider if you: ? Have any allergies ? Are breastfeeding or plan to breastfeed ? Have any serious illnesses ? Are taking any medicines (prescription, over-the-counter, vitamins, or herbal products).  How do I take LAGEVRIO? ? Take LAGEVRIO exactly as your healthcare provider tells you to take it. ? Take 4 capsules of LAGEVRIO every 12 hours (for example, at 8 am and at 8 pm) ? Take LAGEVRIO for 5 days. It is important that you complete the full 5 days of treatment with LAGEVRIO. Do not stop taking LAGEVRIO before you complete the full 5 days of treatment, even if you feel better. ? Take LAGEVRIO with or without food. ? You should stay in isolation for as long as your healthcare provider tells you to. Talk to your healthcare provider if you are not sure about how to properly isolate while you have COVID-19. ? Swallow LAGEVRIO capsules whole. Do not open, break, or crush the capsules. If you cannot swallow capsules whole, tell your healthcare provider. ? What to do if you miss a dose: o If it has been less than 10 hours since the missed dose, take it as soon as you remember o If it has been more than 10 hours since the missed dose, skip the missed dose and take your dose at the next scheduled time. ? Do not double the dose of LAGEVRIO to make up for a missed dose.  What are the important possible side effects of LAGEVRIO? ? See, "What is the most important information I should know about LAGEVRIO?" ? Allergic Reactions. Allergic reactions can happen in people taking LAGEVRIO, even after only 1 dose. Stop taking LAGEVRIO and call your healthcare provider right away if you get any of the following symptoms  of an allergic reaction: o hives o rapid heartbeat o trouble swallowing or breathing o swelling of the mouth, lips, or face o throat tightness o hoarseness o skin rash The most common side effects of LAGEVRIO are: ? diarrhea ? nausea ? dizziness These are not all the possible side effects of LAGEVRIO. Not many people have taken LAGEVRIO. Serious and unexpected side effects may happen. This medicine is still being studied, so it is possible that all of the risks are not known at this time.  What other treatment choices are there?  Veklury (remdesivir) is FDA-approved as an intravenous (IV) infusion for the treatment of mildto-moderate BHALP-37 in certain adults and children. Talk with your doctor to see if Marijean Heath is appropriate for you. Like LAGEVRIO, FDA may also allow for the emergency use of other medicines to treat people with COVID-19. Go to LacrosseProperties.si for more information. It is your choice to be treated or not to be treated with LAGEVRIO. Should you decide  not to take it, it will not change your standard medical care.  What if I am breastfeeding? Breastfeeding is not recommended during treatment with LAGEVRIO and for 4 days after the last dose of LAGEVRIO. If you are breastfeeding or plan to breastfeed, talk to your healthcare provider about your options and specific situation before taking LAGEVRIO.  How do I report side effects with LAGEVRIO? Contact your healthcare provider if you have any side effects that bother you or do not go away. Report side effects to FDA MedWatch at SmoothHits.hu or call 1-800-FDA-1088 (1- 636-748-0617).  How should I store [place]? ? Store LAGEVRIO capsules at room temperature between 38F to 62F (20C to 25C). ? Keep LAGEVRIO and all medicines out of the reach of children and pets. How can I learn more about COVID-19? ?  Ask your healthcare provider. ? Visit SeekRooms.co.uk ? Contact your local or state public health department. ? Call [place] at [phone] (toll free in the [place] ? Visit [email or web address].com  What Is an Emergency Use Authorization (EUA)? The [place] FDA has made [place] available under an emergency access mechanism called an Emergency Use Authorization (EUA) The EUA is supported by a Presenter, broadcasting Health and Human Service (HHS) declaration that circumstances exist to justify emergency use of drugs and biological products during the COVID-19 pandemic. LAGEVRIO for the treatment of mild-to-moderate COVID-19 in adults with positive results of direct SARS-CoV-2 viral testing, who are at high risk for progression to severe COVID-19, including hospitalization or death, and for whom alternative COVID-19 treatment options approved or authorized by FDA are not accessible or clinically appropriate, has not undergone the same type of review as an FDA-approved product. In issuing an EUA under the XLEZV-47 public health emergency, the FDA has determined, among other things, that based on the total amount of scientific evidence available including data from adequate and well-controlled clinical trials, if available, it is reasonable to believe that the product may be effective for diagnosing, treating, or preventing COVID-19, or a serious or life-threatening disease or condition caused by COVID-19; that the known and potential benefits of the product, when used to diagnose, treat, or prevent such disease or condition, outweigh the known and potential risks of such product; and that there are no adequate, approved, and available alternatives.  All of these criteria must be met to allow for the product to be used in the treatment of patients during the COVID-19 pandemic. The EUA for LAGEVRIO is in effect for the duration of the COVID-19 declaration justifying emergency use of  LAGEVRIO, unless terminated or revoked (after which LAGEVRIO may no longer be used under the EUA). For patent information: http://rogers.info/ Copyright  2021-2022 [place] [place], [place] and its affiliates. All rights reserved. [id number] Revised: March 2022

## 2022-06-06 ENCOUNTER — Telehealth: Payer: Self-pay | Admitting: Family Medicine

## 2022-06-06 ENCOUNTER — Encounter: Payer: Self-pay | Admitting: Family Medicine

## 2022-06-06 NOTE — Telephone Encounter (Signed)
Patient is scheduled for thursday 9/28. She is on her 5th day of being covid positive. She would like to know would it still be okay to come in with a n95 mask or or should she reschedule?

## 2022-06-07 NOTE — Telephone Encounter (Signed)
Please check with patient.  If there is an urgent issue that needs to be addressed this week then she should come in wearing an N95 mask.  If she is improving and is able to push this back a week, then I am okay with that too.  Thanks.

## 2022-06-07 NOTE — Telephone Encounter (Signed)
Spoke with patient and moved appt to 06/14/22 at 11:00 am.

## 2022-06-09 ENCOUNTER — Ambulatory Visit: Payer: Medicare Other | Admitting: Family Medicine

## 2022-06-14 ENCOUNTER — Ambulatory Visit: Payer: Medicare Other | Admitting: Family Medicine

## 2022-06-15 ENCOUNTER — Ambulatory Visit
Admission: RE | Admit: 2022-06-15 | Discharge: 2022-06-15 | Disposition: A | Discharge status: Home / Self Care | Payer: Medicare Other | Source: Ambulatory Visit | Attending: Obstetrics and Gynecology | Admitting: Obstetrics and Gynecology

## 2022-06-15 ENCOUNTER — Ambulatory Visit: Admission: RE | Admit: 2022-06-15 | Payer: Medicare Other | Source: Ambulatory Visit

## 2022-06-15 DIAGNOSIS — N6489 Other specified disorders of breast: Secondary | ICD-10-CM | POA: Diagnosis not present

## 2022-06-15 DIAGNOSIS — R928 Other abnormal and inconclusive findings on diagnostic imaging of breast: Secondary | ICD-10-CM

## 2022-06-20 ENCOUNTER — Ambulatory Visit: Payer: Medicare Other | Admitting: Family Medicine

## 2022-06-27 ENCOUNTER — Telehealth: Payer: Self-pay

## 2022-06-27 ENCOUNTER — Ambulatory Visit (INDEPENDENT_AMBULATORY_CARE_PROVIDER_SITE_OTHER): Payer: Medicare Other | Admitting: Family Medicine

## 2022-06-27 ENCOUNTER — Encounter: Payer: Self-pay | Admitting: Family Medicine

## 2022-06-27 VITALS — BP 132/76 | HR 89 | Temp 97.3°F | Ht 66.5 in | Wt 174.0 lb

## 2022-06-27 DIAGNOSIS — E119 Type 2 diabetes mellitus without complications: Secondary | ICD-10-CM | POA: Diagnosis not present

## 2022-06-27 DIAGNOSIS — Z23 Encounter for immunization: Secondary | ICD-10-CM

## 2022-06-27 DIAGNOSIS — R0602 Shortness of breath: Secondary | ICD-10-CM

## 2022-06-27 LAB — POCT GLYCOSYLATED HEMOGLOBIN (HGB A1C): Hemoglobin A1C: 7.7 % — AB (ref 4.0–5.6)

## 2022-06-27 MED ORDER — OMEPRAZOLE 40 MG PO CPDR: 40.0000 mg | DELAYED_RELEASE_CAPSULE | ORAL | 3 refills | Status: DC | PRN

## 2022-06-27 NOTE — Progress Notes (Unsigned)
She had custody of her grandson and she is caring for him. And she is still working full time.  I thanked her for her effort.  She said she is safe at home.  She has liver eval pending at [facility].    She had been having SOB with exertion, esp going up hill.  [place]  [place].  No CP with walking.  SOBOE is worse in the last few months.  H/o cardiology eval noted.  No BLE edema today.  Occ edema noted if not using compression stockings.  She has noted occ pulse irregularity.    Diabetes:  Using medications without difficulties: yes Hypoglycemic episodes: no sx Hyperglycemic episodes: no sx Feet problems: occ tingling but not consistent.   Blood Sugars averaging: not checked.   eye exam within last year: due, is scheduled.   A1c done at [facility].  Meds, vitals, and allergies reviewed.   ROS: Per HPI unless specifically indicated in ROS section   GEN: nad, alert and oriented HEENT: ncat NECK: supple w/o LA CV: rrr. PULM: ctab, no inc wob ABD: soft, +bs EXT: no edema SKIN: Well-perfused.  Diabetic foot exam: Normal inspection No skin breakdown No calluses  Normal DP pulses Normal sensation to light touch and monofilament Nails normal  EKG without acute changes.  Discussed with patient at office visit.  30 minutes were devoted to patient care in this encounter (this includes time spent reviewing the patient's file/history, interviewing and examining the patient, counseling/reviewing plan with patient).

## 2022-06-27 NOTE — Telephone Encounter (Signed)
Patient forgot to ask while in office for a prescription for hemorrhoids?

## 2022-06-27 NOTE — Patient Instructions (Addendum)
Go to the lab on the way out.   If you have mychart we'll likely use that to update you.    Check your sugar and try to cut back on sweets.   Take care.  Glad to see you. Plan on recheck in about 3 months.   Please check with cardiology about follow up.  If you have exertional chest pain then go to the ER.

## 2022-06-28 LAB — COMPREHENSIVE METABOLIC PANEL
ALT: 86 U/L — ABNORMAL HIGH (ref 0–35)
AST: 61 U/L — ABNORMAL HIGH (ref 0–37)
Albumin: 4.4 g/dL (ref 3.5–5.2)
Alkaline Phosphatase: 107 U/L (ref 39–117)
BUN: 14 mg/dL (ref 6–23)
CO2: 30 mEq/L (ref 19–32)
Calcium: 10.3 mg/dL (ref 8.4–10.5)
Chloride: 101 mEq/L (ref 96–112)
Creatinine, Ser: 1 mg/dL (ref 0.40–1.20)
GFR: 56.51 mL/min — ABNORMAL LOW (ref >60.00)
Glucose, Bld: 139 mg/dL — ABNORMAL HIGH (ref 70–99)
Potassium: 4.2 mEq/L (ref 3.5–5.1)
Sodium: 139 mEq/L (ref 135–145)
Total Bilirubin: 0.5 mg/dL (ref 0.2–1.2)
Total Protein: 7 g/dL (ref 6.0–8.3)

## 2022-06-28 LAB — CBC WITH DIFFERENTIAL/PLATELET
Basophils Absolute: 0.1 10*3/uL (ref 0.0–0.1)
Basophils Relative: 1.5 % (ref 0.0–3.0)
Eosinophils Absolute: 0.2 10*3/uL (ref 0.0–0.7)
Eosinophils Relative: 2.2 % (ref 0.0–5.0)
HCT: 42.4 % (ref 36.0–46.0)
Hemoglobin: 14 g/dL (ref 12.0–15.0)
Lymphocytes Relative: 36 % (ref 12.0–46.0)
Lymphs Abs: 2.7 10*3/uL (ref 0.7–4.0)
MCHC: 33.1 g/dL (ref 30.0–36.0)
MCV: 90.3 fl (ref 78.0–100.0)
Monocytes Absolute: 0.7 10*3/uL (ref 0.1–1.0)
Monocytes Relative: 9.5 % (ref 3.0–12.0)
Neutro Abs: 3.9 10*3/uL (ref 1.4–7.7)
Neutrophils Relative %: 50.8 % (ref 43.0–77.0)
Platelets: 158 10*3/uL (ref 150.0–400.0)
RBC: 4.7 Mil/uL (ref 3.87–5.11)
RDW: 14.5 % (ref 11.5–15.5)
WBC: 7.6 10*3/uL (ref 4.0–10.5)

## 2022-06-28 LAB — LIPID PANEL
Cholesterol: 124 mg/dL (ref 0–200)
HDL: 38.1 mg/dL — ABNORMAL LOW (ref >39.00)
NonHDL: 85.83
Total CHOL/HDL Ratio: 3
Triglycerides: 213 mg/dL — ABNORMAL HIGH (ref 0.0–149.0)
VLDL: 42.6 mg/dL — ABNORMAL HIGH (ref 0.0–40.0)

## 2022-06-28 LAB — TSH: TSH: 3.84 u[IU]/mL (ref 0.35–5.50)

## 2022-06-28 LAB — LDL CHOLESTEROL, DIRECT: Direct LDL: 64 mg/dL

## 2022-06-28 LAB — MICROALBUMIN / CREATININE URINE RATIO
Creatinine,U: 140.3 mg/dL
Microalb Creat Ratio: 1.5 mg/g (ref 0.0–30.0)
Microalb, Ur: 2.1 mg/dL — ABNORMAL HIGH (ref 0.0–1.9)

## 2022-06-28 LAB — BRAIN NATRIURETIC PEPTIDE: Pro B Natriuretic peptide (BNP): 45 pg/mL (ref 0.0–100.0)

## 2022-06-28 NOTE — Telephone Encounter (Signed)
What symptoms?  Itching/pain/internal/external? Please let me know.  Thanks.

## 2022-06-28 NOTE — Telephone Encounter (Signed)
LMTCB

## 2022-06-29 NOTE — Assessment & Plan Note (Signed)
I asked her to start rechecking her sugar and cut back on carbohydrates in her diet. Plan on recheck in about 3 months.   Continue metformin and Januvia for now.

## 2022-06-29 NOTE — Assessment & Plan Note (Signed)
I asked her to check with cardiology about follow up.  I will route this note to Dr. Jesse Fall as Juluis Rainier. If exertional chest pain then go to the ER.   See notes on labs.  Continue metoprolol lisinopril and Repatha for now.

## 2022-06-30 MED ORDER — HYDROCORTISONE ACETATE 25 MG RE SUPP: 25.0000 mg | Freq: Two times a day (BID) | RECTAL | 0 refills | Status: DC

## 2022-06-30 NOTE — Telephone Encounter (Signed)
Would try suppository with otc prep H externally if needed.  Rx sent.  Thanks.

## 2022-06-30 NOTE — Telephone Encounter (Signed)
Patient states she has pain on the inside and they are swollen on the outside. There is no itching usually.

## 2022-06-30 NOTE — Addendum Note (Signed)
Addended by: Tonia Ghent on: 06/30/2022 01:58 PM   Modules accepted: Orders

## 2022-07-01 DIAGNOSIS — G4733 Obstructive sleep apnea (adult) (pediatric): Secondary | ICD-10-CM | POA: Diagnosis not present

## 2022-07-01 NOTE — Telephone Encounter (Signed)
Patient notified rx was sent

## 2022-07-11 ENCOUNTER — Ambulatory Visit: Payer: Medicare Other | Admitting: Nurse Practitioner

## 2022-07-13 ENCOUNTER — Ambulatory Visit: Payer: Medicare Other | Admitting: Physician Assistant

## 2022-08-01 ENCOUNTER — Other Ambulatory Visit: Payer: Self-pay

## 2022-08-01 ENCOUNTER — Ambulatory Visit: Payer: Medicare Other | Attending: Nurse Practitioner | Admitting: Cardiovascular Disease

## 2022-08-01 ENCOUNTER — Encounter: Payer: Self-pay | Admitting: Cardiovascular Disease

## 2022-08-01 VITALS — BP 120/62 | HR 87 | Ht 66.0 in | Wt 176.6 lb

## 2022-08-01 DIAGNOSIS — R0600 Dyspnea, unspecified: Secondary | ICD-10-CM

## 2022-08-01 DIAGNOSIS — G4733 Obstructive sleep apnea (adult) (pediatric): Secondary | ICD-10-CM | POA: Diagnosis not present

## 2022-08-01 DIAGNOSIS — I10 Essential (primary) hypertension: Secondary | ICD-10-CM

## 2022-08-01 DIAGNOSIS — E782 Mixed hyperlipidemia: Secondary | ICD-10-CM

## 2022-08-01 DIAGNOSIS — R0789 Other chest pain: Secondary | ICD-10-CM | POA: Diagnosis not present

## 2022-08-01 MED ORDER — REPATHA SURECLICK 140 MG/ML ~~LOC~~ SOAJ: 1.0000 | SUBCUTANEOUS | 11 refills | Status: DC

## 2022-08-01 NOTE — Patient Instructions (Signed)
Medication Instructions:  Your physician recommends that you continue on your current medications as directed. Please refer to the Current Medication list given to you today.  *If you need a refill on your cardiac medications before your next appointment, please call your pharmacy*  Lab Work: If you have labs (blood work) drawn today and your tests are completely normal, you will receive your results only by: [place] (if you have MyChart) OR A paper copy in the mail If you have any lab test that is abnormal or we need to change your treatment, we will call you to review the results.   Testing/Procedures: Your physician has requested that you have an echocardiogram. Echocardiography is a painless test that uses sound waves to create images of your heart. It provides your doctor with information about the size and shape of your heart and how well your heart's chambers and valves are working. This procedure takes approximately one hour. There are no restrictions for this procedure. Please do NOT wear cologne, perfume, aftershave, or lotions (deodorant is allowed). Please arrive 15 minutes prior to your appointment time.  Follow-Up: At [facility], you and your health needs are our priority.  As part of our continuing mission to provide you with exceptional heart care, we have created designated Provider Care Teams.  These Care Teams include your primary Cardiologist (physician) and Advanced Practice Providers (APPs -  Physician Assistants and Nurse Practitioners) who all work together to provide you with the care you need, when you need it.  We recommend signing up for the patient portal called "MyChart".  Sign up information is provided on this After Visit Summary.  MyChart is used to connect with patients for Virtual Visits (Telemedicine).  Patients are able to view lab/test results, encounter notes, upcoming appointments, etc.  Non-urgent messages can be sent to your provider as  well.   To learn more about what you can do with MyChart, go to [email or web address].    Your next appointment:   6 month(s)  The format for your next appointment:   In Person  Provider:   Jenkins Rouge, MD     Important Information About Sugar

## 2022-08-01 NOTE — Progress Notes (Signed)
Cardiology Office Note   Date:  08/01/2022   ID:  Allison Yoder, DOB September 29, 1950, MRN [id number]  PCP:  Tonia Ghent, MD  Cardiologist:   Jenkins Rouge, MD   No chief complaint on file.      History of Present Illness:  71 y.o. chest pain 12/01/16.  Normal ETT 12/14/16 Calcium score elevated 172 on statin Also history of HTN and DM Had some atypical chest pain April 2022 f/u myovue 12/16/20 normal with no ischemia and EF 76%   Echo from 01/26/16 normal EF 65-70% done for TIA Carotid 5/17  Plaque RICA no stenosis   CVA 01/2016 lacunar with small area of acute infarct in left amygdala and tail of hippocampus   Working at [employer] She has a grand-baby in [place] named [place] She has partial custody of him as her son and his wife Are having issues with drug addiction spent a lot of time and money trying to get full custody   She is intolerant to crestor, lipitor, pravastatin and niacin  Started on Praluent July 2021 with excellent drop results now on Repatha April 2023 due to insurance issues     Having significant right hip pain Plain films 01/29/21 with mild degenerative changes no acute process  ? Bone spur. BP running higher may be related to celebrex use   She had been having SOB with exertion, esp going up hill.  She was able to walk at the [place] fair on flat ground.  No CP with walking.  SOBOE is worse in the last few months.  H/o cardiology eval noted.  No BLE edema today.  Occ edema noted if not using compression stockings.  She has noted occ pulse irregularity.   06/27/22 seen by primary sats 96% on RA with pulse 80';s  ECG SR rate 83 LAE poor R wave progression no acute changes CXR may 2023 with bronchitic changes BNP normal 45 TSH normal Hct 42.4   She is seeing [facility] for elevated liver enzymes had biopsy 08/02/21 AST 61 ALT 86 normal bilirubin   Doing well dyspnea seems related to esophageal dysmotility and usually starts with epigastric tightness   Past Medical History:   Diagnosis Date   Allergy    Anemia    "all the time as a child"   Anxiety    Arthritis    "all my joints; worse in my hands" (10/27/2015)   Chronic lower back pain    Depression    Diverticulosis    Fatty liver    GERD (gastroesophageal reflux disease)    Heart murmur    "dx'd years ago; very mild; never treated" (10/27/2015)   Hyperlipidemia    Hypertension    Hypothyroidism 2002-~ 2010   Iritis    per [facility]   Migraines    "stopped when I went thru menopause"   OSA on CPAP    Pneumonia ~ 2013; 10/27/2015   Recurrent urinary tract infection    Routine general medical examination at a health care facility 02/26/2014   Snores    Stroke ([place])    Type II diabetes mellitus ([place])    "lost weight; started exercising again; don't have it anymore" (10/27/2015)   Urinary incontinence    Wears glasses     Past Surgical History:  Procedure Laterality Date   BLEPHAROPLASTY Bilateral 2013; 2016   BREAST LUMPECTOMY Right 1964   benign tumor,   HEMORRHOID BANDING  X 2   KNEE ARTHROSCOPY Left  2016   "meniscus repair"   PELVIC FLOOR REPAIR  2001   "lift"   POLYPECTOMY  X 3   "bladder"   SHOULDER ARTHROSCOPY W/ ROTATOR CUFF REPAIR Left 2013   SHOULDER ARTHROSCOPY W/ ROTATOR CUFF REPAIR Right 2015   TUBAL LIGATION  ~ 1986   VAGINAL HYSTERECTOMY  1992   Partial     Current Outpatient Medications  Medication Sig Dispense Refill   albuterol (PROVENTIL) (2.5 MG/3ML) 0.083% nebulizer solution Take 3 mLs (2.5 mg total) by nebulization every 6 (six) hours as needed for wheezing or shortness of breath. 150 mL 1   albuterol (VENTOLIN HFA) 108 (90 Base) MCG/ACT inhaler INHALE 1 TO 2 PUFFS INTO THE LUNGS EVERY 6 HOURS AS NEEDED FOR WHEEZING OR SHORTNESS OF BREATH 25.5 g 1   Blood Glucose Monitoring Suppl (ONE TOUCH ULTRA 2) w/Device KIT Check blood sugar once daily and as instructed. Dx E11.9 1 each 0   citalopram (CELEXA) 20 MG tablet Take 1 tablet (20 mg total) by mouth daily.  90 tablet 3   clonazePAM (KLONOPIN) 0.5 MG tablet Take 1 tablet (0.5 mg total) by mouth daily as needed for anxiety. 30 tablet 1   cyanocobalamin 1000 MCG tablet Take 1,000 mcg by mouth daily.     cyclobenzaprine (FLEXERIL) 10 MG tablet Take 1 tablet (10 mg total) by mouth daily as needed for muscle spasms. 90 tablet 3   Evolocumab (REPATHA SURECLICK) 478 MG/ML SOAJ Inject 1 pen. into the skin every 14 (fourteen) days. 2 mL 11   fluticasone (FLONASE) 50 MCG/ACT nasal spray Place 1 spray into both nostrils daily as needed for allergies or rhinitis.     glucose blood (ONE TOUCH ULTRA TEST) test strip Check blood sugar once daily and as instructed. Dx E11.9 100 each 3   hydrocortisone (ANUSOL-HC) 25 MG suppository Place 1 suppository (25 mg total) rectally 2 (two) times daily. 12 suppository 0   lisinopril (ZESTRIL) 40 MG tablet Take 1 tablet (40 mg total) by mouth daily. 90 tablet 3   metFORMIN (GLUCOPHAGE) 500 MG tablet Take 2 tablets (1,000 mg total) by mouth 2 (two) times daily with a meal. 360 tablet 3   metoprolol succinate (TOPROL-XL) 50 MG 24 hr tablet TAKE 1 TABLET BY MOUTH  DAILY WITH OR IMMEDIATELY  FOLLOWING A MEAL 90 tablet 3   Multiple Vitamin (MULTIVITAMIN) tablet Take 1 tablet by mouth daily.     omeprazole (PRILOSEC) 40 MG capsule Take 1 capsule (40 mg total) by mouth as needed. 90 capsule 3   OneTouch Delica Lancets 29F MISC Check blood sugar once daily and as instructed. Dx E11.9 100 each 3   sitaGLIPtin (JANUVIA) 50 MG tablet Take 1 tablet (50 mg total) by mouth daily. 90 tablet 3   Vitamin D, Cholecalciferol, 1000 units CAPS Take 3,000 Units by mouth daily.     Turmeric 500 MG CAPS Take 2 capsules by mouth daily.     No current facility-administered medications for this visit.    Allergies:   Cephalexin, Codeine, Inapsine [droperidol], Crestor [rosuvastatin], Lipitor [atorvastatin calcium], Niacin and related, Pravastatin, Adhesive [tape], and Latex    Social History:  The  patient  reports that she quit smoking about 36 years ago. Her smoking use included cigarettes. She has a 10.00 pack-year smoking history. She has never used smokeless tobacco. She reports that she does not currently use alcohol. She reports that she does not use drugs.   Family History:  The patient's family history includes  Alcohol abuse in her father; Arthritis in her maternal grandmother, mother, and sister; Breast cancer in her cousin; Dementia in her paternal grandmother; Depression in her mother; Diabetes in her maternal grandmother, mother, paternal uncle, and sister; Heart disease in her mother, paternal aunt, paternal aunt, paternal uncle, and paternal uncle; Hyperlipidemia in her mother; Hypertension in her mother; Lung cancer in her father; Stroke in her maternal grandmother, mother, and paternal aunt.    ROS:  Please see the history of present illness.   Otherwise, review of systems are positive for none.   All other systems are reviewed and negative.    PHYSICAL EXAM: VS:  BP 120/62   Pulse 87   Ht _0  (1.676 m)   Wt 176 lb 9.6 oz (80.1 kg)   SpO2 97%   BMI 28.50 kg/m  , BMI Body mass index is 28.5 kg/m. Affect appropriate Healthy:  appears stated age 71: normal Neck supple with no adenopathy JVP normal no bruits no thyromegaly Lungs clear with no wheezing and good diaphragmatic motion Heart:  S1/S2 no murmur, no rub, gallop or click PMI normal Abdomen: benighn, BS positve, no tenderness, no AAA no bruit.  No HSM or HJR Distal pulses intact with no bruits No edema Neuro non-focal Skin warm and dry No muscular weakness   EKG:  NSR normal ECG  11/20/17 4/14//21  SR rate 92 normal 08/01/2022 NSR rate 86 normal    Recent Labs: 06/27/2022: ALT 86; BUN 14; Creatinine, Ser 1.00; Hemoglobin 14.0; Platelets 158.0; Potassium 4.2; Pro B Natriuretic peptide (BNP) 45.0; Sodium 139; TSH 3.84    Lipid Panel    Component Value Date/Time   CHOL 124 06/27/2022 1612   CHOL  96 (L) 05/27/2020 0737   TRIG 213.0 (H) 06/27/2022 1612   HDL 38.10 (L) 06/27/2022 1612   HDL 39 (L) 05/27/2020 0737   CHOLHDL 3 06/27/2022 1612   VLDL 42.6 (H) 06/27/2022 1612   LDLCALC 34 05/27/2020 0737   LDLDIRECT 64.0 06/27/2022 1612      Wt Readings from Last 3 Encounters:  08/01/22 176 lb 9.6 oz (80.1 kg)  06/27/22 174 lb (78.9 kg)  06/02/22 171 lb (77.6 kg)      Other studies Reviewed: Additional studies/ records that were reviewed today include: CXR, ECG labs and records ER visit 12/01/16.    ASSESSMENT AND PLAN:  1.  Chest Pain:  Normal ETT 12/14/16  and normal myovue with no ischemia 12/16/20 Calcium Score 172 86 th percentile 12/16/16 Calcium was noted In proximal RCA and LAD Continue medical Rx 2. Chol:  On Repatha LDL 64  3. HTN:  Increase Lisinopril to 40 mg daily f/u primary may need addition of diuretic Decrease use of NSAI's  4. DM:  Discussed low carb diet.  Target hemoglobin A1c is 6.5 or less.  Continue current medications. 5. Depression on celexa f/u Dr Damita Dunnings  6. Stroke: lacunar on ASA carotids with no high grade lesion f/u neuro  7. Ortho:  right hip pain f/u Dr Damita Dunnings plain films not high risk consider US guided steroid injection  8. Dyspnea:  non cardiac BNP normal ? GI related update TTE for EF 9. Elevated Transaminase:  f/u [facility] mild prior biopsy not related to PSK0  ? NASH by [place]    Current medicines are reviewed at length with the patient today.  The patient does not have concerns regarding medicines.   Labs/ tests ordered today include:  TTE     Orders Placed This  Encounter  Procedures   ECHOCARDIOGRAM COMPLETE      Disposition:   FU with me in 6 months pending studies     Signed, Jenkins Rouge, MD  08/01/2022 3:47 PM    [facility] [place], [place], [place]  [address] Phone: [phone]; Fax: [phone]

## 2022-08-12 DIAGNOSIS — K7581 Nonalcoholic steatohepatitis (NASH): Secondary | ICD-10-CM | POA: Diagnosis not present

## 2022-08-12 DIAGNOSIS — K7460 Unspecified cirrhosis of liver: Secondary | ICD-10-CM | POA: Diagnosis not present

## 2022-08-12 DIAGNOSIS — K760 Fatty (change of) liver, not elsewhere classified: Secondary | ICD-10-CM | POA: Diagnosis not present

## 2022-08-12 DIAGNOSIS — Z87891 Personal history of nicotine dependence: Secondary | ICD-10-CM | POA: Diagnosis not present

## 2022-08-12 DIAGNOSIS — Z23 Encounter for immunization: Secondary | ICD-10-CM | POA: Diagnosis not present

## 2022-08-23 DIAGNOSIS — K7460 Unspecified cirrhosis of liver: Secondary | ICD-10-CM | POA: Diagnosis not present

## 2022-08-23 DIAGNOSIS — K7402 Hepatic fibrosis, advanced fibrosis: Secondary | ICD-10-CM | POA: Diagnosis not present

## 2022-08-23 DIAGNOSIS — K7581 Nonalcoholic steatohepatitis (NASH): Secondary | ICD-10-CM | POA: Diagnosis not present

## 2022-08-31 DIAGNOSIS — G4733 Obstructive sleep apnea (adult) (pediatric): Secondary | ICD-10-CM | POA: Diagnosis not present

## 2022-09-06 DIAGNOSIS — E119 Type 2 diabetes mellitus without complications: Secondary | ICD-10-CM | POA: Diagnosis not present

## 2022-09-06 LAB — HM DIABETES EYE EXAM

## 2022-09-08 ENCOUNTER — Ambulatory Visit (HOSPITAL_COMMUNITY): Payer: Medicare Other | Attending: Cardiology

## 2022-09-08 DIAGNOSIS — E782 Mixed hyperlipidemia: Secondary | ICD-10-CM

## 2022-09-08 DIAGNOSIS — R0789 Other chest pain: Secondary | ICD-10-CM | POA: Diagnosis not present

## 2022-09-08 DIAGNOSIS — R0600 Dyspnea, unspecified: Secondary | ICD-10-CM

## 2022-09-08 DIAGNOSIS — I10 Essential (primary) hypertension: Secondary | ICD-10-CM

## 2022-09-08 LAB — ECHOCARDIOGRAM COMPLETE
Area-P 1/2: 3.21 cm2
S' Lateral: 2.1 cm

## 2022-09-09 ENCOUNTER — Telehealth: Payer: Self-pay

## 2022-09-09 ENCOUNTER — Ambulatory Visit (INDEPENDENT_AMBULATORY_CARE_PROVIDER_SITE_OTHER): Payer: Medicare Other

## 2022-09-09 VITALS — Ht 66.0 in | Wt 176.0 lb

## 2022-09-09 DIAGNOSIS — Z0000 Encounter for general adult medical examination without abnormal findings: Secondary | ICD-10-CM | POA: Diagnosis not present

## 2022-09-09 NOTE — Addendum Note (Signed)
Addended by: Leta Jungling on: 09/09/2022 05:04 PM   Modules accepted: Level of Service

## 2022-09-09 NOTE — Patient Instructions (Addendum)
Allison Yoder , Thank you for taking time to come for your Medicare Wellness Visit. I appreciate your ongoing commitment to your health goals. Please review the following plan we discussed and let me know if I can assist you in the future.   These are the goals we discussed:  Goals Addressed             This Visit's Progress    Maintain Healthy Lifestyle       Stay active Healthy diet         This is a list of the screening recommended for you and due dates:  Health Maintenance  Topic Date Due   DTaP/Tdap/Td vaccine (2 - Td or Tdap) 06/25/2022   COVID-19 Vaccine (5 - 2023-24 season) 09/25/2022*   Zoster (Shingles) Vaccine (1 of 2) 12/09/2022*   Hemoglobin A1C  12/27/2022   Yearly kidney function blood test for diabetes  06/28/2023   Yearly kidney health urinalysis for diabetes  06/28/2023   Complete foot exam   06/28/2023   Eye exam for diabetics  09/09/2023   Medicare Annual Wellness Visit  09/10/2023   Mammogram  06/15/2024   Colon Cancer Screening  04/16/2025   Pneumonia Vaccine  Completed   Flu Shot  Completed   DEXA scan (bone density measurement)  Completed   Hepatitis C Screening: USPSTF Recommendation to screen - Ages 65-79 yo.  Completed   HPV Vaccine  Aged Out  *Topic was postponed. The date shown is not the original due date.    Advanced directives:   Conditions/risks identified: none new  Next appointment: Follow up in one year for your annual wellness visit    Preventive Care 65 Years and Older, Female Preventive care refers to lifestyle choices and visits with your health care provider that can promote health and wellness. What does preventive care include? A yearly physical exam. This is also called an annual well check. Dental exams once or twice a year. Routine eye exams. Ask your health care provider how often you should have your eyes checked. Personal lifestyle choices, including: Daily care of your teeth and gums. Regular physical  activity. Eating a healthy diet. Avoiding tobacco and drug use. Limiting alcohol use. Practicing safe sex. Taking low-dose aspirin every day. Taking vitamin and mineral supplements as recommended by your health care provider. What happens during an annual well check? The services and screenings done by your health care provider during your annual well check will depend on your age, overall health, lifestyle risk factors, and family history of disease. Counseling  Your health care provider may ask you questions about your: Alcohol use. Tobacco use. Drug use. Emotional well-being. Home and relationship well-being. Sexual activity. Eating habits. History of falls. Memory and ability to understand (cognition). Work and work Statistician. Reproductive health. Screening  You may have the following tests or measurements: Height, weight, and BMI. Blood pressure. Lipid and cholesterol levels. These may be checked every 5 years, or more frequently if you are over 58 years old. Skin check. Lung cancer screening. You may have this screening every year starting at age 77 if you have a 30-pack-year history of smoking and currently smoke or have quit within the past 15 years. Fecal occult blood test (FOBT) of the stool. You may have this test every year starting at age 62. Flexible sigmoidoscopy or colonoscopy. You may have a sigmoidoscopy every 5 years or a colonoscopy every 10 years starting at age 33. Hepatitis C blood test. Hepatitis B blood  test. Sexually transmitted disease (STD) testing. Diabetes screening. This is done by checking your blood sugar (glucose) after you have not eaten for a while (fasting). You may have this done every 1-3 years. Bone density scan. This is done to screen for osteoporosis. You may have this done starting at age 40. Mammogram. This may be done every 1-2 years. Talk to your health care provider about how often you should have regular mammograms. Talk with your  health care provider about your test results, treatment options, and if necessary, the need for more tests. Vaccines  Your health care provider may recommend certain vaccines, such as: Influenza vaccine. This is recommended every year. Tetanus, diphtheria, and acellular pertussis (Tdap, Td) vaccine. You may need a Td booster every 10 years. Zoster vaccine. You may need this after age 49. Pneumococcal 13-valent conjugate (PCV13) vaccine. One dose is recommended after age 15. Pneumococcal polysaccharide (PPSV23) vaccine. One dose is recommended after age 58. Talk to your health care provider about which screenings and vaccines you need and how often you need them. This information is not intended to replace advice given to you by your health care provider. Make sure you discuss any questions you have with your health care provider. Document Released: 09/25/2015 Document Revised: 05/18/2016 Document Reviewed: 06/30/2015 Elsevier Interactive Patient Education  2017 [place] Prevention in the Home Falls can cause injuries. They can happen to people of all ages. There are many things you can do to make your home safe and to help prevent falls. What can I do on the outside of my home? Regularly fix the edges of walkways and driveways and fix any cracks. Remove anything that might make you trip as you walk through a door, such as a raised step or threshold. Trim any bushes or trees on the path to your home. Use bright outdoor lighting. Clear any walking paths of anything that might make someone trip, such as rocks or tools. Regularly check to see if handrails are loose or broken. Make sure that both sides of any steps have handrails. Any raised decks and porches should have guardrails on the edges. Have any leaves, snow, or ice cleared regularly. Use sand or salt on walking paths during winter. Clean up any spills in your garage right away. This includes oil or grease spills. What can I  do in the bathroom? Use night lights. Install grab bars by the toilet and in the tub and shower. Do not use towel bars as grab bars. Use non-skid mats or decals in the tub or shower. If you need to sit down in the shower, use a plastic, non-slip stool. Keep the floor dry. Clean up any water that spills on the floor as soon as it happens. Remove soap buildup in the tub or shower regularly. Attach bath mats securely with double-sided non-slip rug tape. Do not have throw rugs and other things on the floor that can make you trip. What can I do in the bedroom? Use night lights. Make sure that you have a light by your bed that is easy to reach. Do not use any sheets or blankets that are too big for your bed. They should not hang down onto the floor. Have a firm chair that has side arms. You can use this for support while you get dressed. Do not have throw rugs and other things on the floor that can make you trip. What can I do in the kitchen? Clean up any spills right  away. Avoid walking on wet floors. Keep items that you use a lot in easy-to-reach places. If you need to reach something above you, use a strong step stool that has a grab bar. Keep electrical cords out of the way. Do not use floor polish or wax that makes floors slippery. If you must use wax, use non-skid floor wax. Do not have throw rugs and other things on the floor that can make you trip. What can I do with my stairs? Do not leave any items on the stairs. Make sure that there are handrails on both sides of the stairs and use them. Fix handrails that are broken or loose. Make sure that handrails are as long as the stairways. Check any carpeting to make sure that it is firmly attached to the stairs. Fix any carpet that is loose or worn. Avoid having throw rugs at the top or bottom of the stairs. If you do have throw rugs, attach them to the floor with carpet tape. Make sure that you have a light switch at the top of the stairs  and the bottom of the stairs. If you do not have them, ask someone to add them for you. What else can I do to help prevent falls? Wear shoes that: Do not have high heels. Have rubber bottoms. Are comfortable and fit you well. Are closed at the toe. Do not wear sandals. If you use a stepladder: Make sure that it is fully opened. Do not climb a closed stepladder. Make sure that both sides of the stepladder are locked into place. Ask someone to hold it for you, if possible. Clearly mark and make sure that you can see: Any grab bars or handrails. First and last steps. Where the edge of each step is. Use tools that help you move around (mobility aids) if they are needed. These include: Canes. Walkers. Scooters. Crutches. Turn on the lights when you go into a dark area. Replace any light bulbs as soon as they burn out. Set up your furniture so you have a clear path. Avoid moving your furniture around. If any of your floors are uneven, fix them. If there are any pets around you, be aware of where they are. Review your medicines with your doctor. Some medicines can make you feel dizzy. This can increase your chance of falling. Ask your doctor what other things that you can do to help prevent falls. This information is not intended to replace advice given to you by your health care provider. Make sure you discuss any questions you have with your health care provider. Document Released: 06/25/2009 Document Revised: 02/04/2016 Document Reviewed: 10/03/2014 Elsevier Interactive Patient Education  2017 [employer].

## 2022-09-09 NOTE — Telephone Encounter (Signed)
No answer when called for scheduled AWV. Voice mail denotes this is a dental office. Left message to call the office back. Okay to reschedule.

## 2022-09-09 NOTE — Progress Notes (Addendum)
Subjective:   Allison Yoder is a 71 y.o. female who presents for Medicare Annual (Subsequent) preventive examination.  Review of Systems    No ROS.  Medicare Wellness Virtual Visit.  Visual/audio telehealth visit, UTA vital signs.   See social history for additional risk factors.   Cardiac Risk Factors include: advanced age (>82mn, >>57women)     Objective:    Today's Vitals   09/09/22 1616  Weight: 176 lb (79.8 kg)  Height: _0  (1.676 m)   Body mass index is 28.41 kg/m.     09/09/2022    4:11 PM 09/07/2021    9:50 AM 08/02/2021   11:30 AM 05/03/2021    4:13 PM 06/27/2019   10:52 AM 12/01/2016   10:32 AM 11/11/2016    1:32 PM  Advanced Directives  Does Patient Have a Medical Advance Directive? Yes Yes Yes Yes No  Yes  Type of AParamedicof AMeadowbrookLiving will [place]inder[place]Living will HChipleyLiving will   Living will;Healthcare Power of ASaddle RockLiving will  Does patient want to make changes to medical advance directive? No - Patient declined Yes (MAU/Ambulatory/Procedural Areas - Information given) No - Patient declined No - Patient declined     Copy of HCrowheartin Chart? No - copy requested      Yes  Would patient like information on creating a medical advance directive?     Yes (MAU/Ambulatory/Procedural Areas - Information given)      Current Medications (verified) Outpatient Encounter Medications as of 09/09/2022  Medication Sig   albuterol (PROVENTIL) (2.5 MG/3ML) 0.083% nebulizer solution Take 3 mLs (2.5 mg total) by nebulization every 6 (six) hours as needed for wheezing or shortness of breath.   albuterol (VENTOLIN HFA) 108 (90 Base) MCG/ACT inhaler INHALE 1 TO 2 PUFFS INTO THE LUNGS EVERY 6 HOURS AS NEEDED FOR WHEEZING OR SHORTNESS OF BREATH   Blood Glucose Monitoring Suppl (ONE TOUCH ULTRA 2) w/Device KIT Check blood sugar once daily and as  instructed. Dx E11.9   citalopram (CELEXA) 20 MG tablet Take 1 tablet (20 mg total) by mouth daily.   clonazePAM (KLONOPIN) 0.5 MG tablet Take 1 tablet (0.5 mg total) by mouth daily as needed for anxiety.   cyanocobalamin 1000 MCG tablet Take 1,000 mcg by mouth daily.   cyclobenzaprine (FLEXERIL) 10 MG tablet Take 1 tablet (10 mg total) by mouth daily as needed for muscle spasms.   Evolocumab (REPATHA SURECLICK) 1948MG/ML SOAJ Inject 140 mg into the skin every 14 (fourteen) days.   fluticasone (FLONASE) 50 MCG/ACT nasal spray Place 1 spray into both nostrils daily as needed for allergies or rhinitis.   glucose blood (ONE TOUCH ULTRA TEST) test strip Check blood sugar once daily and as instructed. Dx E11.9   hydrocortisone (ANUSOL-HC) 25 MG suppository Place 1 suppository (25 mg total) rectally 2 (two) times daily.   lisinopril (ZESTRIL) 40 MG tablet Take 1 tablet (40 mg total) by mouth daily.   metFORMIN (GLUCOPHAGE) 500 MG tablet Take 2 tablets (1,000 mg total) by mouth 2 (two) times daily with a meal.   metoprolol succinate (TOPROL-XL) 50 MG 24 hr tablet TAKE 1 TABLET BY MOUTH  DAILY WITH OR IMMEDIATELY  FOLLOWING A MEAL   Multiple Vitamin (MULTIVITAMIN) tablet Take 1 tablet by mouth daily.   omeprazole (PRILOSEC) 40 MG capsule Take 1 capsule (40 mg total) by mouth as needed.   OneTouch Delica Lancets  33G MISC Check blood sugar once daily and as instructed. Dx E11.9   sitaGLIPtin (JANUVIA) 50 MG tablet Take 1 tablet (50 mg total) by mouth daily.   Turmeric 500 MG CAPS Take 2 capsules by mouth daily.   Vitamin D, Cholecalciferol, 1000 units CAPS Take 3,000 Units by mouth daily.   No facility-administered encounter medications on file as of 09/09/2022.    Allergies (verified) Cephalexin, Codeine, Inapsine [droperidol], Crestor [rosuvastatin], Lipitor [atorvastatin calcium], Niacin and related, Pravastatin, Adhesive [tape], and Latex   History: Past Medical History:  Diagnosis Date    Allergy    Anemia    "all the time as a child"   Anxiety    Arthritis    "all my joints; worse in my hands" (10/27/2015)   Chronic lower back pain    Depression    Diverticulosis    Fatty liver    GERD (gastroesophageal reflux disease)    Heart murmur    "dx'd years ago; very mild; never treated" (10/27/2015)   Hyperlipidemia    Hypertension    Hypothyroidism 2002-~ 2010   Iritis    per [place]    "stopped when I went thru menopause"   OSA on CPAP    Pneumonia ~ 2013; 10/27/2015   Recurrent urinary tract infection    Routine general medical examination at a health care facility 02/26/2014   Snores    Stroke ([place])    Type II diabetes mellitus ([place])    "lost weight; started exercising again; don't have it anymore" (10/27/2015)   Urinary incontinence    Wears glasses    Past Surgical History:  Procedure Laterality Date   BLEPHAROPLASTY Bilateral 2013; 2016   BREAST LUMPECTOMY Right 1964   benign tumor,   HEMORRHOID BANDING  X 2   KNEE ARTHROSCOPY Left 2016   "meniscus repair"   PELVIC FLOOR REPAIR  2001   "lift"   POLYPECTOMY  X 3   "bladder"   SHOULDER ARTHROSCOPY W/ ROTATOR CUFF REPAIR Left 2013   SHOULDER ARTHROSCOPY W/ ROTATOR CUFF REPAIR Right 2015   TUBAL LIGATION  ~ 1986   VAGINAL HYSTERECTOMY  1992   Partial   Family History  Problem Relation Age of Onset   Arthritis Mother    Heart disease Mother    Hyperlipidemia Mother    Hypertension Mother    Diabetes Mother    Depression Mother    Stroke Mother    Alcohol abuse Father    Lung cancer Father    Arthritis Maternal Grandmother    Stroke Maternal Grandmother    Diabetes Maternal Grandmother    Heart disease Paternal Uncle        x 2   Arthritis Sister    Breast cancer Cousin    Heart disease Paternal Aunt    Diabetes Paternal Uncle    Diabetes Sister    Heart disease Paternal Uncle        x 5   Heart disease Paternal Aunt        x 3   Stroke Paternal Aunt    Dementia  Paternal Grandmother    Colon cancer Neg Hx    Social History   Socioeconomic History   Marital status: Divorced    Spouse name: Not on file   Number of children: 1   Years of education: Not on file   Highest education level: Not on file  Occupational History   Occupation: Dental Receptionist/Assistant    Comment:  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     Caring for her grandson Glennon Mac) as of 2023   Social Determinants of Health   Financial Resource Strain: Medium Risk (09/09/2022)   Overall Financial Resource Strain (CARDIA)    Difficulty of Paying Living Expenses: Somewhat hard  Food Insecurity: No Food Insecurity (09/09/2022)   Hunger Vital Sign    Worried About Running Out of Food in the Last Year: Never true    [place] in the Last Year: Never true  Transportation Needs: No Transportation Needs (09/09/2022)   PRAPARE - Hydrologist (Medical): No    Lack of Transportation (Non-Medical): No  Physical Activity: Inactive (09/09/2022)   Exercise Vital Sign    Days of Exercise per Week: 0 days    Minutes of Exercise per Session: 0 min  Stress: Stress Concern Present (09/09/2022)   [place]    Feeling of Stress : To some extent  Social Connections: Unknown (09/09/2022)   Social Connection and Isolation Panel [NHANES]    Frequency of Communication with Friends and Family: More than three times a week     Frequency of Social Gatherings with Friends and Family: Once a week    Attends Religious Services: Not on Advertising copywriter or Organizations: No    Attends Archivist Meetings: Never    Marital Status: Divorced    Tobacco Counseling Counseling given: Not Answered   Clinical Intake:  Pre-visit preparation completed: Yes        Diabetes: Yes (Followed by pcp)  How often do you need to have someone help you when you read instructions, pamphlets, or other written materials from your doctor or pharmacy?: 1 - Never  Nutrition Risk Assessment: Does the patient have any non-healing wounds?  No   Diabetes: Is the patient diabetic?  Yes  If diabetic, was a CBG obtained today?  No  Did the patient bring in their glucometer from home?  No     Interpreter Needed?: No      Activities of Daily Living    09/09/2022    4:13 PM 09/09/2022    1:41 PM  In your present state of health, do you have any difficulty performing the following activities:  Hearing? 1 1  Vision? 0 0  Difficulty concentrating or making decisions? 1 1  Walking or climbing stairs? 0 0  Dressing or bathing? 0 0  Doing errands, shopping? 0 0  Preparing Food and eating ? N N  Using the Toilet? N   In the past six months, have you accidently leaked urine? Y Y  Comment Stress incontinence   Do you have problems with loss of bowel control? N N  Managing your Medications? N N  Managing your Finances? N N  Housekeeping  or managing your Housekeeping? N N    Patient Care Team: Tonia Ghent, MD as PCP - General (Family Medicine) Josue Hector, MD as PCP - Cardiology (Cardiology) Debbora Dus, [facility] as Pharmacist (Pharmacist) Clarene Essex, MD as Consulting Physician (Gastroenterology)  Indicate any recent Medical Services you may have received from other than [facility] providers in the past year (date may be approximate).     Assessment:   This is a routine wellness examination for  Allison Yoder.  I connected with  Allison Yoder on 09/09/22 by a audio enabled telemedicine application and verified that I am speaking with the correct person using two identifiers.  Patient Location: Home  Provider Location: Office/Clinic  I discussed the limitations of evaluation and management by telemedicine. The patient expressed understanding and agreed to proceed.   Hearing/Vision screen Hearing Screening - Comments:: Hearing aid, bilateral  Vision Screening - Comments:: Last exam 08/2022, Dr. Thomasene Ripple, wears glasses  Dietary issues and exercise activities discussed: Current Exercise Habits: Home exercise routine   Goals Addressed             This Visit's Progress    Maintain Healthy Lifestyle       Stay active Healthy diet       Depression Screen    09/09/2022    4:10 PM 09/07/2021    9:56 AM 05/03/2021    4:13 PM 07/28/2020    7:56 AM 06/27/2019   10:52 AM 06/08/2018   12:31 PM 11/11/2016    1:59 PM  PHQ 2/9 Scores  PHQ - 2 Score 0 0 0 0 0 0 2  PHQ- 9 Score     0  13    Fall Risk    09/09/2022    1:41 PM 09/07/2021    9:54 AM 05/03/2021    4:13 PM 07/28/2020    7:56 AM 06/27/2019   10:52 AM  Fall Risk   Falls in the past year? 0 0 0 0 0  Number falls in past yr: 0 0  0   Injury with Fall? 0 0  0   Risk for fall due to :  No Fall Risks   Medication side effect  Follow up  Falls prevention discussed  Falls evaluation completed Falls evaluation completed;Falls prevention discussed    FALL RISK PREVENTION PERTAINING TO THE HOME: Home free of loose throw rugs in walkways, pet beds, electrical cords, etc? Yes  Adequate lighting in your home to reduce risk of falls? Yes   ASSISTIVE DEVICES UTILIZED TO PREVENT FALLS: Life alert? No  Use of a cane, walker or w/c? No   TIMED UP AND GO: Was the test performed? No .   Cognitive Function:    06/27/2019   10:54 AM 11/11/2016    1:09 PM  MMSE - Mini Mental State Exam  Orientation to time 5 5   Orientation to Place 5 5  Registration 3 3  Attention/ Calculation 5 0  Recall 3 3  Language- name 2 objects  0  Language- repeat 1 1  Language- follow 3 step command  3  Language- read & follow direction  0  Write a sentence  0  Copy design  0  Total score  20        09/09/2022    4:28 PM  6CIT Screen  What Year? 0 points  What month? 0 points  What time? 0 points  Count back from 20 0 points  Months in reverse 0 points  Repeat phrase 0 points  Total Score 0 points    Immunizations Immunization History  Administered Date(s) Administered   Fluad Quad(high Dose 65+) 05/31/2019, 06/22/2020, 06/07/2021, 06/27/2022   Hep A / Hep B 11/16/2021, 01/04/2022, 06/27/2022   Hepatitis B, adult 07/19/2013, 08/27/2013, 04/04/2014   Hepb-cpg 07/19/2013, 08/27/2013, 04/04/2014   Influenza, High Dose Seasonal PF 08/01/2018   Influenza,inj,Quad PF,6+ Mos 07/25/2014   Influenza-Unspecified 05/14/2015, 05/13/2016, 06/08/2017   Moderna Covid-19 Vaccine Bivalent Booster 36yr & up 07/01/2021   PFIZER(Purple Top)SARS-COV-2 Vaccination 10/08/2019, 10/29/2019, 06/22/2020   PNEUMOCOCCAL CONJUGATE-20 08/12/2022   Pneumococcal Conjugate-13 02/26/2014   Pneumococcal Polysaccharide-23 05/06/2016   Tdap 06/25/2012   Zoster, Live 09/10/2012   TDAP status: Due, Education has been provided regarding the importance of this vaccine. Advised may receive this vaccine at local pharmacy or Health Dept. Aware to provide a copy of the vaccination record if obtained from local pharmacy or Health Dept. Verbalized acceptance and understanding.  Shingrix Completed?: No.    Education has been provided regarding the importance of this vaccine. Patient has been advised to call insurance company to determine out of pocket expense if they have not yet received this vaccine. Advised may also receive vaccine at local pharmacy or Health Dept. Verbalized acceptance and understanding.  Screening Tests Health Maintenance   Topic Date Due   DTaP/Tdap/Td (2 - Td or Tdap) 06/25/2022   COVID-19 Vaccine (5 - 2023-24 season) 09/25/2022 (Originally 05/13/2022)   Zoster Vaccines- Shingrix (1 of 2) 12/09/2022 (Originally 07/20/1970)   HEMOGLOBIN A1C  12/27/2022   Diabetic kidney evaluation - eGFR measurement  06/28/2023   Diabetic kidney evaluation - Urine ACR  06/28/2023   FOOT EXAM  06/28/2023   OPHTHALMOLOGY EXAM  09/09/2023   Medicare Annual Wellness (AWV)  09/10/2023   MAMMOGRAM  06/15/2024   COLONOSCOPY (Pts 45-483yrInsurance coverage will need to be confirmed)  04/16/2025   Pneumonia Vaccine 6549Years old  Completed   INFLUENZA VACCINE  Completed   DEXA SCAN  Completed   Hepatitis C Screening  Completed   HPV VACCINES  Aged Out    Health Maintenance  Health Maintenance Due  Topic Date Due   DTaP/Tdap/Td (2 - Td or Tdap) 06/25/2022   Lung Cancer Screening: (Low Dose CT Chest recommended if Age 252-80ears, 30 pack-year currently smoking OR have quit w/in 15years.) does not qualify.   Hepatitis C Screening: Completed 04/2015.  Vision Screening: Recommended annual ophthalmology exams for early detection of glaucoma and other disorders of the eye.  Dental Screening: Recommended annual dental exams for proper oral hygiene.  Community Resource Referral / Chronic Care Management: CRR required this visit?  No   CCM required this visit?  No      Plan:     I have personally reviewed and noted the following in the patient's chart:   Medical and social history Use of alcohol, tobacco or illicit drugs  Current medications and supplements including opioid prescriptions. Patient is not currently taking opioid prescriptions. Functional ability and status Nutritional status Physical activity Advanced directives List of other physicians Hospitalizations, surgeries, and ER visits in previous 12 months Vitals Screenings to include cognitive, depression, and falls Referrals and appointments  In  addition, I have reviewed and discussed with patient certain preventive protocols, quality metrics, and best practice recommendations. A written personalized care plan for preventive services as well as general preventive health recommendations were provided to patient.     DeLeta JunglingLPN   [id number]  Subjective:   Allison Yoder is a 71 y.o. female who presents for Medicare Annual (Subsequent) preventive examination.  Review of Systems    No ROS.  Medicare Wellness Virtual Visit.  Visual/audio telehealth visit, UTA vital signs.   See social history for additional risk factors.   Cardiac Risk Factors include: advanced age (>26mn, >>31women)     Objective:    Today's Vitals   09/09/22 1616  Weight: 176 lb (79.8 kg)  Height: _0  (1.676 m)   Body mass index is 28.41 kg/m.     09/09/2022    4:11 PM 09/07/2021    9:50 AM 08/02/2021   11:30 AM 05/03/2021    4:13 PM 06/27/2019   10:52 AM 12/01/2016   10:32 AM 11/11/2016    1:32 PM  Advanced Directives  Does Patient Have a Medical Advance Directive? Yes Yes Yes Yes No  Yes  Type of AParamedicof APanamaLiving will HBridgeportLiving will HGreen ValleyLiving will   Living will;Healthcare Power of AGarlandLiving will  Does patient want to make changes to medical advance directive? No - Patient declined Yes (MAU/Ambulatory/Procedural Areas - Information given) No - Patient declined No - Patient declined     Copy of HAllendalein Chart? No - copy requested      Yes  Would patient like information on creating a medical advance directive?     Yes (MAU/Ambulatory/Procedural Areas - Information given)      Current Medications (verified) Outpatient Encounter Medications as of 09/09/2022  Medication Sig   albuterol (PROVENTIL) (2.5 MG/3ML) 0.083% nebulizer solution Take 3 mLs (2.5 mg total) by nebulization  every 6 (six) hours as needed for wheezing or shortness of breath.   albuterol (VENTOLIN HFA) 108 (90 Base) MCG/ACT inhaler INHALE 1 TO 2 PUFFS INTO THE LUNGS EVERY 6 HOURS AS NEEDED FOR WHEEZING OR SHORTNESS OF BREATH   Blood Glucose Monitoring Suppl (ONE TOUCH ULTRA 2) w/Device KIT Check blood sugar once daily and as instructed. Dx E11.9   citalopram (CELEXA) 20 MG tablet Take 1 tablet (20 mg total) by mouth daily.   clonazePAM (KLONOPIN) 0.5 MG tablet Take 1 tablet (0.5 mg total) by mouth daily as needed for anxiety.   cyanocobalamin 1000 MCG tablet Take 1,000 mcg by mouth daily.   cyclobenzaprine (FLEXERIL) 10 MG tablet Take 1 tablet (10 mg total) by mouth daily as needed for muscle spasms.   Evolocumab (REPATHA SURECLICK) 1093MG/ML SOAJ Inject 140 mg into the skin every 14 (fourteen) days.   fluticasone (FLONASE) 50 MCG/ACT nasal spray Place 1 spray into both nostrils daily as needed for allergies or rhinitis.   glucose blood (ONE TOUCH ULTRA TEST) test strip Check blood sugar once daily and as instructed. Dx E11.9   hydrocortisone (ANUSOL-HC) 25 MG suppository Place 1 suppository (25 mg total) rectally 2 (two) times daily.   lisinopril (ZESTRIL) 40 MG tablet Take 1 tablet (40 mg total) by mouth daily.   metFORMIN (GLUCOPHAGE) 500 MG tablet Take 2 tablets (1,000 mg total) by mouth 2 (two) times daily with a meal.   metoprolol succinate (TOPROL-XL) 50 MG 24 hr tablet TAKE 1 TABLET BY MOUTH  DAILY WITH OR IMMEDIATELY  FOLLOWING A MEAL   Multiple Vitamin (MULTIVITAMIN) tablet Take 1 tablet by mouth daily.   omeprazole (PRILOSEC) 40 MG capsule Take 1 capsule (40 mg total) by mouth as needed.   OneTouch Delica Lancets 381W  MISC Check blood sugar once daily and as instructed. Dx E11.9   sitaGLIPtin (JANUVIA) 50 MG tablet Take 1 tablet (50 mg total) by mouth daily.   Turmeric 500 MG CAPS Take 2 capsules by mouth daily.   Vitamin D, Cholecalciferol, 1000 units CAPS Take 3,000 Units by mouth daily.    No facility-administered encounter medications on file as of 09/09/2022.    Allergies (verified) Cephalexin, Codeine, Inapsine [droperidol], Crestor [rosuvastatin], Lipitor [atorvastatin calcium], Niacin and related, Pravastatin, Adhesive [tape], and Latex   History: Past Medical History:  Diagnosis Date   Allergy    Anemia    "all the time as a child"   Anxiety    Arthritis    "all my joints; worse in my hands" (10/27/2015)   Chronic lower back pain    Depression    Diverticulosis    Fatty liver    GERD (gastroesophageal reflux disease)    Heart murmur    "dx'd years ago; very mild; never treated" (10/27/2015)   Hyperlipidemia    Hypertension    Hypothyroidism 2002-~ 2010   Iritis    per [place]    "stopped when I went thru menopause"   OSA on CPAP    Pneumonia ~ 2013; 10/27/2015   Recurrent urinary tract infection    Routine general medical examination at a health care facility 02/26/2014   Snores    Stroke ([place])    Type II diabetes mellitus ([place])    "lost weight; started exercising again; don't have it anymore" (10/27/2015)   Urinary incontinence    Wears glasses    Past Surgical History:  Procedure Laterality Date   BLEPHAROPLASTY Bilateral 2013; 2016   BREAST LUMPECTOMY Right 1964   benign tumor,   HEMORRHOID BANDING  X 2   KNEE ARTHROSCOPY Left 2016   "meniscus repair"   PELVIC FLOOR REPAIR  2001   "lift"   POLYPECTOMY  X 3   "bladder"   SHOULDER ARTHROSCOPY W/ ROTATOR CUFF REPAIR Left 2013   SHOULDER ARTHROSCOPY W/ ROTATOR CUFF REPAIR Right 2015   TUBAL LIGATION  ~ 1986   VAGINAL HYSTERECTOMY  1992   Partial   Family History  Problem Relation Age of Onset   Arthritis Mother    Heart disease Mother    Hyperlipidemia Mother    Hypertension Mother    Diabetes Mother    Depression Mother    Stroke Mother    Alcohol abuse Father    Lung cancer Father    Arthritis Maternal Grandmother    Stroke Maternal Grandmother     Diabetes Maternal Grandmother    Heart disease Paternal Uncle        x 2   Arthritis Sister    Breast cancer Cousin    Heart disease Paternal Aunt    Diabetes Paternal Uncle    Diabetes Sister    Heart disease Paternal Uncle        x 5   Heart disease Paternal Aunt        x 3   Stroke Paternal Aunt    Dementia Paternal Grandmother    Colon cancer Neg Hx    Social History   Socioeconomic History   Marital status: Divorced    Spouse name: Not on file   Number of children: 1   Years of education: Not on file   Highest education level: Not on file  Occupational History   Occupation: Dental Receptionist/Assistant    Comment: Dr.  Daniel in [place]  Tobacco Use   Smoking status: Former    Packs/day: 2.00    Years: 5.00    Total pack years: 10.00    Types: Cigarettes    Quit date: 09/12/1985    Years since quitting: 37.0   Smokeless tobacco: Never  Vaping Use   Vaping Use: Never used  Substance and Sexual Activity   Alcohol use: Not Currently   Drug use: No   Sexual activity: Yes  Other Topics Concern   Not on file  Social History Narrative   Education:  Futures trader   Divorced and lives with her brother.     Caring for her grandson Glennon Mac) as of 2023   Social Determinants of Health   Financial Resource Strain: Medium Risk (09/09/2022)   Overall Financial Resource Strain (CARDIA)    Difficulty of Paying Living Expenses: Somewhat hard  Food Insecurity: No Food Insecurity (09/09/2022)   Hunger Vital Sign    Worried About Running Out of Food in the Last Year: Never true    [place] in the Last Year: Never true  Transportation Needs: No Transportation Needs (09/09/2022)   PRAPARE - Hydrologist (Medical): No    Lack of Transportation (Non-Medical): No  Physical Activity: Inactive (09/09/2022)   Exercise Vital Sign    Days of Exercise per Week: 0 days    Minutes of Exercise per Session: 0 min  Stress: Stress  Concern Present (09/09/2022)   [place]    Feeling of Stress : To some extent  Social Connections: Unknown (09/09/2022)   Social Connection and Isolation Panel [NHANES]    Frequency of Communication with Friends and Family: More than three times a week    Frequency of Social Gatherings with Friends and Family: Once a week    Attends Religious Services: Not on Advertising copywriter or Organizations: No    Attends Archivist Meetings: Never    Marital Status: Divorced    Tobacco Counseling Counseling given: Not Answered   Clinical Intake:  Pre-visit preparation completed: Yes        Diabetes: Yes (Followed by pcp)  How often do you need to have someone help you when you read instructions, pamphlets, or other written materials from your doctor or pharmacy?: 1 - Never   Interpreter Needed?: No      Activities of Daily Living    09/09/2022    4:13 PM 09/09/2022    1:41 PM  In your present state of health, do you have any difficulty performing the following activities:  Hearing? 1 1  Vision? 0 0  Difficulty concentrating or making decisions? 1 1  Walking or climbing stairs? 0 0  Dressing or bathing? 0 0  Doing errands, shopping? 0 0  Preparing Food and eating ? N N  Using the Toilet? N   In the past six months, have you accidently leaked urine? Y Y  Comment Stress incontinence   Do you have problems with loss of bowel control? N N  Managing your Medications? N N  Managing your Finances? N N  Housekeeping or managing your Housekeeping? N N    Patient Care Team: Tonia Ghent, MD as PCP - General (Family Medicine) Josue Hector, MD as PCP - Cardiology (Cardiology) Debbora Dus, [facility] [facility] System as Pharmacist (Pharmacist) Clarene Essex, MD as Consulting Physician (Gastroenterology)  Indicate any  recent Medical Services you may have received from other than [facility] providers in the past  year (date may be approximate).     Assessment:   This is a routine wellness examination for Allison Yoder.  I connected with  Allison Yoder on 09/09/22 by a audio enabled telemedicine application and verified that I am speaking with the correct person using two identifiers.  Patient Location: Home  Provider Location: Office/Clinic  I discussed the limitations of evaluation and management by telemedicine. The patient expressed understanding and agreed to proceed.   Hearing/Vision screen Hearing Screening - Comments:: Hearing aid, bilateral  Vision Screening - Comments:: Last exam 08/2022, Dr. Thomasene Ripple, wears glasses  Dietary issues and exercise activities discussed: Current Exercise Habits: Home exercise routine  She tries to have a healthy diet. Plans to reduce sugar intake.    Goals Addressed             This Visit's Progress    Maintain Healthy Lifestyle       Stay active Healthy diet       Depression Screen    09/09/2022    4:10 PM 09/07/2021    9:56 AM 05/03/2021    4:13 PM 07/28/2020    7:56 AM 06/27/2019   10:52 AM 06/08/2018   12:31 PM 11/11/2016    1:59 PM  PHQ 2/9 Scores  PHQ - 2 Score 0 0 0 0 0 0 2  PHQ- 9 Score     0  13    Fall Risk    09/09/2022    1:41 PM 09/07/2021    9:54 AM 05/03/2021    4:13 PM 07/28/2020    7:56 AM 06/27/2019   10:52 AM  Fall Risk   Falls in the past year? 0 0 0 0 0  Number falls in past yr: 0 0  0   Injury with Fall? 0 0  0   Risk for fall due to :  No Fall Risks   Medication side effect  Follow up  Falls prevention discussed  Falls evaluation completed Falls evaluation completed;Falls prevention discussed    FALL RISK PREVENTION PERTAINING TO THE HOME: Home free of loose throw rugs in walkways, pet beds, electrical cords, etc? Yes  Adequate lighting in your home to reduce risk of falls? Yes   ASSISTIVE DEVICES UTILIZED TO PREVENT FALLS: Life alert? No  Use of a cane, walker or w/c? No   TIMED UP AND GO: Was  the test performed? No .   Cognitive Function:    06/27/2019   10:54 AM 11/11/2016    1:09 PM  MMSE - Mini Mental State Exam  Orientation to time 5 5  Orientation to Place 5 5  Registration 3 3  Attention/ Calculation 5 0  Recall 3 3  Language- name 2 objects  0  Language- repeat 1 1  Language- follow 3 step command  3  Language- read & follow direction  0  Write a sentence  0  Copy design  0  Total score  20        09/09/2022    4:28 PM  6CIT Screen  What Year? 0 points  What month? 0 points  What time? 0 points  Count back from 20 0 points  Months in reverse 0 points  Repeat phrase 0 points  Total Score 0 points    Immunizations Immunization History  Administered Date(s) Administered   Fluad Quad(high Dose 65+) 05/31/2019, 06/22/2020, 06/07/2021, 06/27/2022   Hep A /  Hep B 11/16/2021, 01/04/2022, 06/27/2022   Hepatitis B, adult 07/19/2013, 08/27/2013, 04/04/2014   Hepb-cpg 07/19/2013, 08/27/2013, 04/04/2014   Influenza, High Dose Seasonal PF 08/01/2018   Influenza,inj,Quad PF,6+ Mos 07/25/2014   Influenza-Unspecified 05/14/2015, 05/13/2016, 06/08/2017   Moderna Covid-19 Vaccine Bivalent Booster 21yr & up 07/01/2021   PFIZER(Purple Top)SARS-COV-2 Vaccination 10/08/2019, 10/29/2019, 06/22/2020   PNEUMOCOCCAL CONJUGATE-20 08/12/2022   Pneumococcal Conjugate-13 02/26/2014   Pneumococcal Polysaccharide-23 05/06/2016   Tdap 06/25/2012   Zoster, Live 09/10/2012   TDAP status: Due, Education has been provided regarding the importance of this vaccine. Advised may receive this vaccine at local pharmacy or Health Dept. Aware to provide a copy of the vaccination record if obtained from local pharmacy or Health Dept. Verbalized acceptance and understanding.   Shingrix Completed?: No.    Education has been provided regarding the importance of this vaccine. Patient has been advised to call insurance company to determine out of pocket expense if they have not yet received this  vaccine. Advised may also receive vaccine at local pharmacy or Health Dept. Verbalized acceptance and understanding.  Screening Tests Health Maintenance  Topic Date Due   DTaP/Tdap/Td (2 - Td or Tdap) 06/25/2022   COVID-19 Vaccine (5 - 2023-24 season) 09/25/2022 (Originally 05/13/2022)   Zoster Vaccines- Shingrix (1 of 2) 12/09/2022 (Originally 07/20/1970)   HEMOGLOBIN A1C  12/27/2022   Diabetic kidney evaluation - eGFR measurement  06/28/2023   Diabetic kidney evaluation - Urine ACR  06/28/2023   FOOT EXAM  06/28/2023   OPHTHALMOLOGY EXAM  09/09/2023   Medicare Annual Wellness (AWV)  09/10/2023   MAMMOGRAM  06/15/2024   COLONOSCOPY (Pts [id number] coverage will need to be confirmed)  04/16/2025   Pneumonia Vaccine 6568Years old  Completed   INFLUENZA VACCINE  Completed   DEXA SCAN  Completed   Hepatitis C Screening  Completed   HPV VACCINES  Aged Out    Health Maintenance Health Maintenance Due  Topic Date Due   DTaP/Tdap/Td (2 - Td or Tdap) 06/25/2022   Lung Cancer Screening: (Low Dose CT Chest recommended if Age 72-80ears, 30 pack-year currently smoking OR have quit w/in 15years.) does not qualify.   Hepatitis C Screening: Completed 04/2015.  Vision Screening: Recommended annual ophthalmology exams for early detection of glaucoma and other disorders of the eye.  Dental Screening: Recommended annual dental exams for proper oral hygiene  Community Resource Referral / Chronic Care Management: CRR required this visit?  No   CCM required this visit?  No      Plan:     I have personally reviewed and noted the following in the patient's chart:   Medical and social history Use of alcohol, tobacco or illicit drugs  Current medications and supplements including opioid prescriptions. Patient is not currently taking opioid prescriptions. Functional ability and status Nutritional status Physical activity Advanced directives List of other  physicians Hospitalizations, surgeries, and ER visits in previous 12 months Vitals Screenings to include cognitive, depression, and falls Referrals and appointments  In addition, I have reviewed and discussed with patient certain preventive protocols, quality metrics, and best practice recommendations. A written personalized care plan for preventive services as well as general preventive health recommendations were provided to patient.     DeLeta JunglingLPN   [id number]

## 2022-09-26 ENCOUNTER — Telehealth (INDEPENDENT_AMBULATORY_CARE_PROVIDER_SITE_OTHER): Payer: Medicare Other | Admitting: Internal Medicine

## 2022-09-26 ENCOUNTER — Encounter: Payer: Self-pay | Admitting: Internal Medicine

## 2022-09-26 ENCOUNTER — Ambulatory Visit: Payer: Medicare Other | Admitting: Internal Medicine

## 2022-09-26 VITALS — Wt 176.0 lb

## 2022-09-26 DIAGNOSIS — J0110 Acute frontal sinusitis, unspecified: Secondary | ICD-10-CM | POA: Insufficient documentation

## 2022-09-26 MED ORDER — AMOXICILLIN-POT CLAVULANATE 875-125 MG PO TABS: 1.0000 | ORAL_TABLET | Freq: Two times a day (BID) | ORAL | 1 refills | Status: DC

## 2022-09-26 NOTE — Progress Notes (Signed)
Subjective:    Patient ID: Allison Yoder, female    DOB: Mar 28, 1951, 72 y.o.   MRN: [id number]  HPI Video virtual visit due to persistent respiratory symptoms Identification done Reviewed limitations and billing and she gave consent Participants--patient in her home and I am in my office  Symptoms started 2 weeks ago--mostly just sneezing Then started with cough Now with head congestion and much worse in the past couple of days Coughing up yellow stuff and has thick green junk from her nose Bad frontal pain and headache Ear pressure Slight sore throat yesterday No fever No chills or sweats No change in chronic SOB  Using alka seltzer---yesterday switched to the "plus" Has used some acetaminoophen--helps but tries to limit due to her cirrhosis  Current Outpatient Medications on File Prior to Visit  Medication Sig Dispense Refill   albuterol (PROVENTIL) (2.5 MG/3ML) 0.083% nebulizer solution Take 3 mLs (2.5 mg total) by nebulization every 6 (six) hours as needed for wheezing or shortness of breath. 150 mL 1   albuterol (VENTOLIN HFA) 108 (90 Base) MCG/ACT inhaler INHALE 1 TO 2 PUFFS INTO THE LUNGS EVERY 6 HOURS AS NEEDED FOR WHEEZING OR SHORTNESS OF BREATH 25.5 g 1   Blood Glucose Monitoring Suppl (ONE TOUCH ULTRA 2) w/Device KIT Check blood sugar once daily and as instructed. Dx E11.9 1 each 0   citalopram (CELEXA) 20 MG tablet Take 1 tablet (20 mg total) by mouth daily. 90 tablet 3   clonazePAM (KLONOPIN) 0.5 MG tablet Take 1 tablet (0.5 mg total) by mouth daily as needed for anxiety. 30 tablet 1   cyanocobalamin 1000 MCG tablet Take 1,000 mcg by mouth daily.     cyclobenzaprine (FLEXERIL) 10 MG tablet Take 1 tablet (10 mg total) by mouth daily as needed for muscle spasms. 90 tablet 3   Evolocumab (REPATHA SURECLICK) 923 MG/ML SOAJ Inject 140 mg into the skin every 14 (fourteen) days. 2 mL 11   fluticasone (FLONASE) 50 MCG/ACT nasal spray Place 1 spray into both nostrils daily  as needed for allergies or rhinitis.     glucose blood (ONE TOUCH ULTRA TEST) test strip Check blood sugar once daily and as instructed. Dx E11.9 100 each 3   hydrocortisone (ANUSOL-HC) 25 MG suppository Place 1 suppository (25 mg total) rectally 2 (two) times daily. 12 suppository 0   lisinopril (ZESTRIL) 40 MG tablet Take 1 tablet (40 mg total) by mouth daily. 90 tablet 3   metFORMIN (GLUCOPHAGE) 500 MG tablet Take 2 tablets (1,000 mg total) by mouth 2 (two) times daily with a meal. 360 tablet 3   metoprolol succinate (TOPROL-XL) 50 MG 24 hr tablet TAKE 1 TABLET BY MOUTH  DAILY WITH OR IMMEDIATELY  FOLLOWING A MEAL 90 tablet 3   Multiple Vitamin (MULTIVITAMIN) tablet Take 1 tablet by mouth daily.     omeprazole (PRILOSEC) 40 MG capsule Take 1 capsule (40 mg total) by mouth as needed. 90 capsule 3   OneTouch Delica Lancets 30Q MISC Check blood sugar once daily and as instructed. Dx E11.9 100 each 3   sitaGLIPtin (JANUVIA) 50 MG tablet Take 1 tablet (50 mg total) by mouth daily. 90 tablet 3   Vitamin D, Cholecalciferol, 1000 units CAPS Take 3,000 Units by mouth daily.     No current facility-administered medications on file prior to visit.    Allergies  Allergen Reactions   Cephalexin Itching    Does tolerate augmentin   Codeine Itching and Rash   Inapsine [  Droperidol] Shortness Of Breath, Anxiety and Hypertension    Elevated HR and BP, panic attack   Crestor [Rosuvastatin]     Aches   Lipitor [Atorvastatin Calcium]     Myalgias    Niacin And Related     Flushing   Pravastatin     Myalgias   Adhesive [Tape] Rash    Blisters with tape   Latex Rash    Past Medical History:  Diagnosis Date   Allergy    Anemia    "all the time as a child"   Anxiety    Arthritis    "all my joints; worse in my hands" (10/27/2015)   Chronic lower back pain    Depression    Diverticulosis    Fatty liver    GERD (gastroesophageal reflux disease)    Heart murmur    "dx'd years ago; very mild;  never treated" (10/27/2015)   Hyperlipidemia    Hypertension    Hypothyroidism 2002-~ 2010   Iritis    per [facility]   Migraines    "stopped when I went thru menopause"   OSA on CPAP    Pneumonia ~ 2013; 10/27/2015   Recurrent urinary tract infection    Routine general medical examination at a health care facility 02/26/2014   Snores    Stroke ([place])    Type II diabetes mellitus ([place])    "lost weight; started exercising again; don't have it anymore" (10/27/2015)   Urinary incontinence    Wears glasses     Past Surgical History:  Procedure Laterality Date   BLEPHAROPLASTY Bilateral 2013; 2016   BREAST LUMPECTOMY Right 1964   benign tumor,   HEMORRHOID BANDING  X 2   KNEE ARTHROSCOPY Left 2016   "meniscus repair"   PELVIC FLOOR REPAIR  2001   "lift"   POLYPECTOMY  X 3   "bladder"   SHOULDER ARTHROSCOPY W/ ROTATOR CUFF REPAIR Left 2013   SHOULDER ARTHROSCOPY W/ ROTATOR CUFF REPAIR Right 2015   TUBAL LIGATION  ~ 1986   VAGINAL HYSTERECTOMY  1992   Partial    Family History  Problem Relation Age of Onset   Arthritis Mother    Heart disease Mother    Hyperlipidemia Mother    Hypertension Mother    Diabetes Mother    Depression Mother    Stroke Mother    Alcohol abuse Father    Lung cancer Father    Arthritis Maternal Grandmother    Stroke Maternal Grandmother    Diabetes Maternal Grandmother    Heart disease Paternal Uncle        x 2   Arthritis Sister    Breast cancer Cousin    Heart disease Paternal Aunt    Diabetes Paternal Uncle    Diabetes Sister    Heart disease Paternal Uncle        x 5   Heart disease Paternal Aunt        x 3   Stroke Paternal Aunt    Dementia Paternal Grandmother    Colon cancer Neg Hx     Social History   Socioeconomic History   Marital status: Divorced    Spouse name: Not on file   Number of children: 1   Years of education: Not on file   Highest education level: Not on file  Occupational History   Occupation:  Dental Receptionist/Assistant    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     Caring for her grandson Glennon Mac) as of 2023   Social Determinants of Health   Financial Resource Strain: Medium Risk (09/09/2022)   Overall Financial Resource Strain (CARDIA)    Difficulty of Paying Living Expenses: Somewhat hard  Food Insecurity: No Food Insecurity (09/09/2022)   Hunger Vital Sign    Worried About Running Out of Food in the Last Year: Never true    [place] in the Last Year: Never true  Transportation Needs: No Transportation Needs (09/09/2022)   PRAPARE - Hydrologist (Medical): No    Lack of Transportation (Non-Medical): No  Physical Activity: Inactive (09/09/2022)   Exercise Vital Sign    Days of Exercise per Week: 0 days    Minutes of Exercise per Session: 0 min  Stress: Stress Concern Present (09/09/2022)   [place]    Feeling of Stress : To some extent  Social Connections: Unknown (09/09/2022)   Social Connection and Isolation Panel [NHANES]    Frequency of Communication with Friends and Family: More than three times a week    Frequency of Social Gatherings with Friends and Family: Once a week    Attends Religious Services: Not on Advertising copywriter or Organizations: No    Attends Archivist Meetings: Never    Marital Status: Divorced  Human resources officer Violence: Not At Risk  (09/09/2022)   Humiliation, Afraid, Rape, and Kick questionnaire    Fear of Current or Ex-Partner: No    Emotionally Abused: No    Physically Abused: No    Sexually Abused: No   Review of Systems No N/V Appetite is okay No smell or taste now    Objective:   Physical Exam Constitutional:      Appearance: Normal appearance.  Pulmonary:     Effort: Pulmonary effort is normal. No respiratory distress.  Neurological:     Mental Status: She is alert.            Assessment & Plan:

## 2022-09-26 NOTE — Assessment & Plan Note (Signed)
Clear sinus infection now---after initial cold 2 weeks ago Limited acetaminophen for pain Discussed using flonase regularly for now Will treat with augmentin--has needed extended courses in past so will put on a refill (10-20 days)

## 2022-10-07 DIAGNOSIS — G4733 Obstructive sleep apnea (adult) (pediatric): Secondary | ICD-10-CM | POA: Diagnosis not present

## 2022-10-12 ENCOUNTER — Ambulatory Visit (INDEPENDENT_AMBULATORY_CARE_PROVIDER_SITE_OTHER): Payer: Medicare Other | Admitting: Internal Medicine

## 2022-10-12 ENCOUNTER — Encounter: Payer: Self-pay | Admitting: Internal Medicine

## 2022-10-12 VITALS — BP 122/74 | HR 83 | Temp 97.6°F | Ht 66.0 in | Wt 178.0 lb

## 2022-10-12 DIAGNOSIS — J029 Acute pharyngitis, unspecified: Secondary | ICD-10-CM | POA: Insufficient documentation

## 2022-10-12 MED ORDER — OMEPRAZOLE 20 MG PO CPDR: 20.0000 mg | DELAYED_RELEASE_CAPSULE | Freq: Every day | ORAL | 3 refills | Status: DC

## 2022-10-12 NOTE — Progress Notes (Signed)
Subjective:    Patient ID: Allison Yoder, female    DOB: Jul 19, 1951, 72 y.o.   MRN: [id number]  HPI Here due to persistent cough and sore throat  Sinus symptoms have cleared up---with one dose of augmentin Chronic drainage that seems back to her baseline Still has sore throat and dry cough  Did start with cough/sore throat with the initial infection for a month or so Does have pain with swallowing---feels swollen Cough mostly with lying down  Chronic GERD---chews ginger after dinner and that usually takes care of things Hasn't been on the omeprazole  No wheezing No SOB---other than usual limitations (fitness) No history of asthma  Current Outpatient Medications on File Prior to Visit  Medication Sig Dispense Refill   albuterol (PROVENTIL) (2.5 MG/3ML) 0.083% nebulizer solution Take 3 mLs (2.5 mg total) by nebulization every 6 (six) hours as needed for wheezing or shortness of breath. 150 mL 1   albuterol (VENTOLIN HFA) 108 (90 Base) MCG/ACT inhaler INHALE 1 TO 2 PUFFS INTO THE LUNGS EVERY 6 HOURS AS NEEDED FOR WHEEZING OR SHORTNESS OF BREATH 25.5 g 1   Blood Glucose Monitoring Suppl (ONE TOUCH ULTRA 2) w/Device KIT Check blood sugar once daily and as instructed. Dx E11.9 1 each 0   citalopram (CELEXA) 20 MG tablet Take 1 tablet (20 mg total) by mouth daily. 90 tablet 3   clonazePAM (KLONOPIN) 0.5 MG tablet Take 1 tablet (0.5 mg total) by mouth daily as needed for anxiety. 30 tablet 1   cyanocobalamin 1000 MCG tablet Take 1,000 mcg by mouth daily.     cyclobenzaprine (FLEXERIL) 10 MG tablet Take 1 tablet (10 mg total) by mouth daily as needed for muscle spasms. 90 tablet 3   Evolocumab (REPATHA SURECLICK) 742 MG/ML SOAJ Inject 140 mg into the skin every 14 (fourteen) days. 2 mL 11   fluticasone (FLONASE) 50 MCG/ACT nasal spray Place 1 spray into both nostrils daily as needed for allergies or rhinitis.     glucose blood (ONE TOUCH ULTRA TEST) test strip Check blood sugar once daily  and as instructed. Dx E11.9 100 each 3   hydrocortisone (ANUSOL-HC) 25 MG suppository Place 1 suppository (25 mg total) rectally 2 (two) times daily. 12 suppository 0   lisinopril (ZESTRIL) 40 MG tablet Take 1 tablet (40 mg total) by mouth daily. 90 tablet 3   metFORMIN (GLUCOPHAGE) 500 MG tablet Take 2 tablets (1,000 mg total) by mouth 2 (two) times daily with a meal. 360 tablet 3   metoprolol succinate (TOPROL-XL) 50 MG 24 hr tablet TAKE 1 TABLET BY MOUTH  DAILY WITH OR IMMEDIATELY  FOLLOWING A MEAL 90 tablet 3   Multiple Vitamin (MULTIVITAMIN) tablet Take 1 tablet by mouth daily.     omeprazole (PRILOSEC) 40 MG capsule Take 1 capsule (40 mg total) by mouth as needed. 90 capsule 3   OneTouch Delica Lancets 59D MISC Check blood sugar once daily and as instructed. Dx E11.9 100 each 3   sitaGLIPtin (JANUVIA) 50 MG tablet Take 1 tablet (50 mg total) by mouth daily. 90 tablet 3   Vitamin D, Cholecalciferol, 1000 units CAPS Take 3,000 Units by mouth daily.     No current facility-administered medications on file prior to visit.    Allergies  Allergen Reactions   Cephalexin Itching    Does tolerate augmentin   Codeine Itching and Rash   Inapsine [Droperidol] Shortness Of Breath, Anxiety and Hypertension    Elevated HR and BP, panic attack  Crestor [Rosuvastatin]     Aches   Lipitor [Atorvastatin Calcium]     Myalgias    Niacin And Related     Flushing   Pravastatin     Myalgias   Adhesive [Tape] Rash    Blisters with tape   Latex Rash    Past Medical History:  Diagnosis Date   Allergy    Anemia    "all the time as a child"   Anxiety    Arthritis    "all my joints; worse in my hands" (10/27/2015)   Chronic lower back pain    Depression    Diverticulosis    Fatty liver    GERD (gastroesophageal reflux disease)    Heart murmur    "dx'd years ago; very mild; never treated" (10/27/2015)   Hyperlipidemia    Hypertension    Hypothyroidism 2002-~ 2010   Iritis    per [facility]   Migraines    "stopped when I went thru menopause"   OSA on CPAP    Pneumonia ~ 2013; 10/27/2015   Recurrent urinary tract infection    Routine general medical examination at a health care facility 02/26/2014   Snores    Stroke ([place])    Type II diabetes mellitus ([place])    "lost weight; started exercising again; don't have it anymore" (10/27/2015)   Urinary incontinence    Wears glasses     Past Surgical History:  Procedure Laterality Date   BLEPHAROPLASTY Bilateral 2013; 2016   BREAST LUMPECTOMY Right 1964   benign tumor,   HEMORRHOID BANDING  X 2   KNEE ARTHROSCOPY Left 2016   "meniscus repair"   PELVIC FLOOR REPAIR  2001   "lift"   POLYPECTOMY  X 3   "bladder"   SHOULDER ARTHROSCOPY W/ ROTATOR CUFF REPAIR Left 2013   SHOULDER ARTHROSCOPY W/ ROTATOR CUFF REPAIR Right 2015   TUBAL LIGATION  ~ 1986   VAGINAL HYSTERECTOMY  1992   Partial    Family History  Problem Relation Age of Onset   Arthritis Mother    Heart disease Mother    Hyperlipidemia Mother    Hypertension Mother    Diabetes Mother    Depression Mother    Stroke Mother    Alcohol abuse Father    Lung cancer Father    Arthritis Maternal Grandmother    Stroke Maternal Grandmother    Diabetes Maternal Grandmother    Heart disease Paternal Uncle        x 2   Arthritis Sister    Breast cancer Cousin    Heart disease Paternal Aunt    Diabetes Paternal Uncle    Diabetes Sister    Heart disease Paternal Uncle        x 5   Heart disease Paternal Aunt        x 3   Stroke Paternal Aunt    Dementia Paternal Grandmother    Colon cancer Neg Hx     Social History   Socioeconomic History   Marital status: Divorced    Spouse name: Not on file   Number of children: 1   Years of education: Not on file   Highest education level: Not on file  Occupational History   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     Caring for her grandson Glennon Mac) as of 2023   Social Determinants of Health   Financial Resource Strain: Medium Risk (09/09/2022)   Overall Financial Resource Strain (CARDIA)    Difficulty of Paying Living Expenses: Somewhat hard  Food Insecurity: No Food Insecurity (09/09/2022)   Hunger Vital Sign    Worried About Running Out of Food in the Last Year: Never true    [place] in the Last Year: Never true  Transportation Needs: No Transportation Needs (09/09/2022)   PRAPARE - Hydrologist (Medical): No    Lack of Transportation (Non-Medical): No  Physical Activity: Inactive (09/09/2022)   Exercise Vital Sign    Days of Exercise per Week: 0 days    Minutes of Exercise per Session: 0 min  Stress: Stress Concern Present (09/09/2022)   [place]    Feeling of Stress : To some extent  Social Connections: Unknown (09/09/2022)   Social Connection and Isolation Panel [NHANES]    Frequency of Communication with Friends and Family: More than three times a week    Frequency of Social Gatherings with Friends and Family: Once a week    Attends Religious Services: Not on Advertising copywriter or Organizations: No    Attends Archivist Meetings: Never    Marital Status: Divorced  Human resources officer Violence: Not At Risk (09/09/2022)   Humiliation, Afraid, Rape, and Kick questionnaire    Fear of Current or Ex-Partner: No    Emotionally  Abused: No    Physically Abused: No    Sexually Abused: No   Review of Systems No N/V Eating okay Voice seems fine    Objective:   Physical Exam Constitutional:      Appearance: Normal appearance.  HENT:     Mouth/Throat:     Pharynx: No oropharyngeal exudate or posterior oropharyngeal erythema.  Pulmonary:     Effort: Pulmonary effort is normal.     Breath sounds: Normal breath sounds. No wheezing or rales.  Musculoskeletal:     Cervical back: Neck supple.  Lymphadenopathy:     Cervical: No cervical adenopathy.  Neurological:     Mental Status: She is alert.            Assessment & Plan:

## 2022-10-12 NOTE — Assessment & Plan Note (Signed)
Doesn't seem infectious This and the cough are most consistent with flare of GERD--which she has chronically Will try omeprazole '20mg'$  daily for 2-6 weeks If not improving, will set up with ENT

## 2022-10-12 NOTE — Patient Instructions (Signed)
Please start the omeprazole '20mg'$  daily on an empty stomach. You should continue this for at least 2 weeks (if your symptoms improve right away) or longer if they are slower to improve. If you are not better in a month, we will set you up with an ENT

## 2022-10-27 ENCOUNTER — Encounter: Payer: Self-pay | Admitting: Family Medicine

## 2022-10-27 ENCOUNTER — Other Ambulatory Visit: Payer: Self-pay | Admitting: Family Medicine

## 2022-11-02 DIAGNOSIS — K080 Exfoliation of teeth due to systemic causes: Secondary | ICD-10-CM | POA: Diagnosis not present

## 2022-11-06 ENCOUNTER — Other Ambulatory Visit: Payer: Self-pay | Admitting: Family Medicine

## 2022-11-07 DIAGNOSIS — G4733 Obstructive sleep apnea (adult) (pediatric): Secondary | ICD-10-CM | POA: Diagnosis not present

## 2022-11-09 DIAGNOSIS — K080 Exfoliation of teeth due to systemic causes: Secondary | ICD-10-CM | POA: Diagnosis not present

## 2022-11-10 ENCOUNTER — Other Ambulatory Visit: Payer: Self-pay | Admitting: Family Medicine

## 2022-11-10 DIAGNOSIS — H2513 Age-related nuclear cataract, bilateral: Secondary | ICD-10-CM | POA: Diagnosis not present

## 2022-11-10 DIAGNOSIS — H35371 Puckering of macula, right eye: Secondary | ICD-10-CM | POA: Diagnosis not present

## 2022-11-10 DIAGNOSIS — H20021 Recurrent acute iridocyclitis, right eye: Secondary | ICD-10-CM | POA: Diagnosis not present

## 2022-11-10 DIAGNOSIS — E119 Type 2 diabetes mellitus without complications: Secondary | ICD-10-CM | POA: Diagnosis not present

## 2022-11-10 LAB — HM DIABETES EYE EXAM

## 2022-11-14 ENCOUNTER — Encounter: Payer: Self-pay | Admitting: Family Medicine

## 2022-11-15 ENCOUNTER — Other Ambulatory Visit: Payer: Self-pay | Admitting: Family Medicine

## 2022-11-17 ENCOUNTER — Ambulatory Visit (INDEPENDENT_AMBULATORY_CARE_PROVIDER_SITE_OTHER): Payer: Medicare Other | Admitting: Family Medicine

## 2022-11-17 ENCOUNTER — Encounter: Payer: Self-pay | Admitting: Family Medicine

## 2022-11-17 VITALS — BP 120/70 | HR 64 | Temp 98.4°F | Ht 66.0 in | Wt 178.0 lb

## 2022-11-17 DIAGNOSIS — E119 Type 2 diabetes mellitus without complications: Secondary | ICD-10-CM | POA: Diagnosis not present

## 2022-11-17 DIAGNOSIS — H8110 Benign paroxysmal vertigo, unspecified ear: Secondary | ICD-10-CM | POA: Diagnosis not present

## 2022-11-17 DIAGNOSIS — R8290 Unspecified abnormal findings in urine: Secondary | ICD-10-CM | POA: Diagnosis not present

## 2022-11-17 DIAGNOSIS — M67479 Ganglion, unspecified ankle and foot: Secondary | ICD-10-CM | POA: Diagnosis not present

## 2022-11-17 LAB — POCT GLYCOSYLATED HEMOGLOBIN (HGB A1C): Hemoglobin A1C: 8.3 % — AB (ref 4.0–5.6)

## 2022-11-17 MED ORDER — METFORMIN HCL 500 MG PO TABS: 500.0000 mg | ORAL_TABLET | Freq: Two times a day (BID) | ORAL | Status: DC

## 2022-11-17 NOTE — Progress Notes (Signed)
Vertigo vs balance changes, noted in the last few months.  More recently when going to bed at night she sleeps on her L side.  Room spinning with turning to the left.  Brief, self resolves.  Also noted with looking down. No syncope.    Diabetes:  Using medications without difficulties: see below.   Hypoglycemic episodes: no Hyperglycemic episodes: no Feet problems: see exam Blood Sugars averaging: not checked often, prev ~160.  eye exam within last year: yes GI sx in the last month, loose stools/diarrhea, nausea.  No vomiting.  Variable from day to day, better this week.  No blood in stool.  No black stools.    She had noted a film on her urine.  No burning with urination.    She was asking about possible gall bladder source for her symptoms.    Not SOB with walking but we talked about relative deconditioning.  She isn't worse that prev.    Meds, vitals, and allergies reviewed.   ROS: Per HPI unless specifically indicated in ROS section   GEN: nad, alert and oriented HEENT: ncat, TM wnl NECK: supple w/o LA CV: rrr. PULM: ctab, no inc wob ABD: soft, +bs EXT: no edema SKIN: ncat  Diabetic foot exam: Normal inspection No skin breakdown No calluses  Normal DP pulses Normal sensation to light touch and monofilament Nails normal Cystic lesion L 3rd toe.  Prev drained then reformed.    30 minutes were devoted to patient care in this encounter (this includes time spent reviewing the patient's file/history, interviewing and examining the patient, counseling/reviewing plan with patient).

## 2022-11-17 NOTE — Patient Instructions (Addendum)
Refer to podiatry.   Cut metformin back to 1 tab twice a day.  Let me know how that goes.  Go to the lab on the way out.   If you have mychart we'll likely use that to update you.    Use the beside exercise.   Take care.  Glad to see you.

## 2022-11-18 LAB — URINALYSIS, ROUTINE W REFLEX MICROSCOPIC
Bilirubin Urine: NEGATIVE
Hgb urine dipstick: NEGATIVE
Leukocytes,Ua: NEGATIVE
Nitrite: NEGATIVE
Specific Gravity, Urine: >=1.03 — AB (ref 1.000–1.030)
Total Protein, Urine: NEGATIVE
Urine Glucose: 100 — AB
Urobilinogen, UA: 0.2 (ref 0.0–1.0)
pH: 6 (ref 5.0–8.0)

## 2022-11-19 DIAGNOSIS — H8110 Benign paroxysmal vertigo, unspecified ear: Secondary | ICD-10-CM | POA: Insufficient documentation

## 2022-11-19 DIAGNOSIS — R8290 Unspecified abnormal findings in urine: Secondary | ICD-10-CM | POA: Insufficient documentation

## 2022-11-19 NOTE — Assessment & Plan Note (Signed)
Likely BPV.  Discussed home exercises.  See after visit summary.  Anatomy and pathophysiology discussed with patient.  Okay for outpatient follow-up.

## 2022-11-19 NOTE — Assessment & Plan Note (Signed)
See notes on labs. 

## 2022-11-19 NOTE — Assessment & Plan Note (Signed)
Refer to podiatry given the cystic lesion.  Cut back metformin to 1 tablet twice a day to see if she has symptoms improved.  Continue work on diet and exercise.  Continue Januvia recheck periodically.

## 2022-11-20 ENCOUNTER — Encounter: Payer: Self-pay | Admitting: Family Medicine

## 2022-11-29 NOTE — Telephone Encounter (Signed)
Patient called in to advise Dr. Damita Dunnings of her sugar reading for today,they were 279 at 815 am and 222 at 1100 am. Allison Yoder would like to know should Allison Yoder come in to be seen,as Allison Yoder has just gotten over diverticulitis?

## 2022-11-30 ENCOUNTER — Encounter: Payer: Self-pay | Admitting: Family Medicine

## 2022-12-06 DIAGNOSIS — G4733 Obstructive sleep apnea (adult) (pediatric): Secondary | ICD-10-CM | POA: Diagnosis not present

## 2022-12-08 ENCOUNTER — Ambulatory Visit: Payer: Medicare Other | Admitting: Podiatry

## 2022-12-08 DIAGNOSIS — M2042 Other hammer toe(s) (acquired), left foot: Secondary | ICD-10-CM | POA: Diagnosis not present

## 2022-12-08 DIAGNOSIS — M7989 Other specified soft tissue disorders: Secondary | ICD-10-CM

## 2022-12-08 NOTE — Progress Notes (Signed)
Subjective: Chief Complaint  Patient presents with   Foot Problem    Mucosal cyst, left foot, 5th digit, was referred by PCP   72 year old female presents the above concerns.  She states symptoms been ongoing for about 1 year.  She has previously had clear, jellylike fluid.  It is fluctuated in size.  No injury.  Does not cause significant pain.   Objective: AAO x3, NAD DP/PT pulses palpable bilaterally, CRT less than 3 seconds Along the DIPJ of the left foot 3rd toe is a fluid-filled cyst consistent with a ganglion versus mucoid cyst.  There is clear, joint fluid expressed.  No purulence.  No edema no erythema. Digital contracture noted.  No pain with calf compression, swelling, warmth, erythema  Assessment: Soft tissue mass left foot with digital contracture  Plan: -All treatment options discussed with the patient including all alternatives, risks, complications.  -We discussed surgical versus conservative treatment.  Given her increased A1c will hold off on surgical intervention.  Did discuss aspiration she was proceed with this.  I cleaned the skin with alcohol and utilizing ethyl chloride.  Incised the skin I was able to drink clear, July fluid although small amount with a 25-gauge needle.  I then infiltrated with quarter cc of dexamethasone phosphate with Marcaine plain.  A compression bandage applied.  He tolerated well. -Discussed with her long-term surgical excision, arthroplasty of the DIPJ if needed.  We discussed the procedure as well as postoperative course. -Patient encouraged to call the office with any questions, concerns, change in symptoms.   Trula Slade DPM

## 2022-12-08 NOTE — Patient Instructions (Signed)

## 2022-12-28 DIAGNOSIS — M9905 Segmental and somatic dysfunction of pelvic region: Secondary | ICD-10-CM | POA: Diagnosis not present

## 2022-12-28 DIAGNOSIS — M9904 Segmental and somatic dysfunction of sacral region: Secondary | ICD-10-CM | POA: Diagnosis not present

## 2022-12-28 DIAGNOSIS — M9903 Segmental and somatic dysfunction of lumbar region: Secondary | ICD-10-CM | POA: Diagnosis not present

## 2022-12-28 DIAGNOSIS — M5136 Other intervertebral disc degeneration, lumbar region: Secondary | ICD-10-CM | POA: Diagnosis not present

## 2023-01-05 ENCOUNTER — Telehealth: Payer: Self-pay | Admitting: *Deleted

## 2023-01-05 ENCOUNTER — Ambulatory Visit (INDEPENDENT_AMBULATORY_CARE_PROVIDER_SITE_OTHER): Payer: Medicare Other | Admitting: Family Medicine

## 2023-01-05 ENCOUNTER — Encounter: Payer: Self-pay | Admitting: Family Medicine

## 2023-01-05 VITALS — BP 122/72 | HR 73 | Temp 98.0°F | Ht 66.0 in | Wt 176.0 lb

## 2023-01-05 DIAGNOSIS — Z794 Long term (current) use of insulin: Secondary | ICD-10-CM

## 2023-01-05 DIAGNOSIS — Z659 Problem related to unspecified psychosocial circumstances: Secondary | ICD-10-CM

## 2023-01-05 DIAGNOSIS — G720 Drug-induced myopathy: Secondary | ICD-10-CM | POA: Diagnosis not present

## 2023-01-05 DIAGNOSIS — E119 Type 2 diabetes mellitus without complications: Secondary | ICD-10-CM

## 2023-01-05 DIAGNOSIS — Z7984 Long term (current) use of oral hypoglycemic drugs: Secondary | ICD-10-CM

## 2023-01-05 LAB — POCT GLYCOSYLATED HEMOGLOBIN (HGB A1C): Hemoglobin A1C: 9.9 % — AB (ref 4.0–5.6)

## 2023-01-05 MED ORDER — LANTUS SOLOSTAR 100 UNIT/ML ~~LOC~~ SOPN: 5.0000 [IU] | PEN_INJECTOR | Freq: Every day | SUBCUTANEOUS | 99 refills | Status: DC

## 2023-01-05 MED ORDER — PEN NEEDLES 32G X 5 MM MISC: 3 refills | Status: DC

## 2023-01-05 NOTE — Patient Instructions (Addendum)
Family services- [phone] You should get a call from social work here.  Go to the lab on the way out.   If you have mychart we'll likely use that to update you.    Take care.  Glad to see you.  Start with 5 units at night.  If AM sugar is above 150, then add 1 unit per day.  If below 100, decrease 1 unit.  If 100-150, no change in dose.    Let me know how that goes over the next week.

## 2023-01-05 NOTE — Progress Notes (Signed)
  Care Coordination   Note   01/05/2023 Name: Allison Yoder MRN: [id number] DOB: 08/29/1951  Allison Yoder is a 72 y.o. year old female who sees Joaquim Nam, MD for primary care. I reached out to [employer] by phone today to offer care coordination services.  Allison Yoder was given information about Care Coordination services today including:   The Care Coordination services include support from the care team which includes your Nurse Coordinator, Clinical Social Worker, or Pharmacist.  The Care Coordination team is here to help remove barriers to the health concerns and goals most important to you. Care Coordination services are voluntary, and the patient may decline or stop services at any time by request to their care team member.   Care Coordination Consent Status: Patient agreed to services and verbal consent obtained.   Follow up plan:  Telephone appointment with care coordination team member scheduled for:  01/09/2023  Encounter Outcome:  Pt. Scheduled from referral   Burman Nieves, [facility] Care Coordination Care Guide Direct Dial: [phone]
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
## 2023-01-06 ENCOUNTER — Telehealth: Payer: Self-pay | Admitting: *Deleted

## 2023-01-06 NOTE — Telephone Encounter (Signed)
   Telephone encounter was:  Unsuccessful.  01/06/2023 Name: Allison Yoder MRN: [id number] DOB: [birth year]  Unsuccessful outbound call made today to assist with:  Financial Difficulties related to housing   Outreach Attempt:  1st Attempt  [facility]   Yehuda Mao Greenauer -[facility], Population Health [phone] [address] [place] , [place] [address] Email : [email or web address]
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
## 2023-01-08 NOTE — Assessment & Plan Note (Signed)
Discussed adding on Lantus.  Discussed insulin pen use.  Start with 5 units at night  If AM sugar is above 150, then add 1 unit per day.  If below 100, decrease 1 unit.  If 100-150, no change in dose.  I asked her to update me about her sugar/dose after few days.  See after visit summary.
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
## 2023-01-09 ENCOUNTER — Ambulatory Visit: Payer: Self-pay | Admitting: *Deleted
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
## 2023-01-11 ENCOUNTER — Telehealth: Payer: Self-pay | Admitting: *Deleted

## 2023-01-11 ENCOUNTER — Ambulatory Visit: Payer: Medicare Other | Admitting: Cardiovascular Disease

## 2023-01-11 DIAGNOSIS — H2511 Age-related nuclear cataract, right eye: Secondary | ICD-10-CM | POA: Diagnosis not present

## 2023-01-11 DIAGNOSIS — H2512 Age-related nuclear cataract, left eye: Secondary | ICD-10-CM | POA: Diagnosis not present

## 2023-01-11 NOTE — Telephone Encounter (Signed)
   Telephone encounter was:  Successful.  01/11/2023 Name: MARVELYN BOUCHILLON MRN: [id number] DOB: [birth year]  [name] is a 72 y.o. year old female who is a primary care patient of Joaquim Nam, MD . The community resource team was consulted for assistance with Financial Difficulties related to insulin   Care guide performed the following interventions: Patient provided with information about care guide support team and interviewed to confirm resource needs. Patient insisting her only need is the lowering of her recent insulin medicine that is costing 14o a month from [facility] will put in arequest for pharmacy to look inot patient assistance programs  Follow Up Plan:  No further follow up planned at this time. The patient has been provided with needed resources.  Yehuda Mao Greenauer -[facility] [place], Population Health [phone] [address] , [place] [address] Email : [email or web address]

## 2023-01-13 ENCOUNTER — Telehealth: Payer: Self-pay | Admitting: Pharmacy Technician

## 2023-01-13 DIAGNOSIS — Z5986 Financial insecurity: Secondary | ICD-10-CM

## 2023-01-13 NOTE — Progress Notes (Signed)
[facility])  [facility] Quality Pharmacy Team   01/13/2023  Allison Yoder 1951-05-23 [id number]  Reason for referral: Medication assistance for insulin per referral received  Referral source:  [facility] Current insurance: [employer] [facility]  Outreach:  Successful telephone call with Patient.  HIPAA identifiers verified.   Medication Review Findings:  Patient is on Lantus prescribed by Dr. Crawford Givens  Medication Assistance Findings:  Medication assistance needs identified: Lantus and Januvia  Extra Help:  Not eligible for Extra Help Low Income Subsidy based on reported income and assets  Additional medication assistance options reviewed with patient as warranted:  No other options identified  Plan: I will route patient assistance letter to [facility] pharmacy technician who will coordinate patient assistance program application process for medications listed above.  [facility] pharmacy technician will assist with obtaining all required documents from both patient and provider(s) and submit application(s) once completed.  Thank you for allowing [facility] pharmacy to be a part of this patient's care.   Macari Zalesky P. Doaa Kendzierski, CPhT [facility] [employer]  [phone]

## 2023-01-19 ENCOUNTER — Telehealth: Payer: Self-pay | Admitting: Pharmacy Technician

## 2023-01-19 DIAGNOSIS — Z5986 Financial insecurity: Secondary | ICD-10-CM

## 2023-01-19 NOTE — Progress Notes (Signed)
[facility])                                            [facility] Quality Pharmacy Team    01/19/2023  MALINI LEHRMANN [birth year] [id number]                                      Medication Assistance Referral  Referral From:  [facility] Health team  Medication/Company: Alma Friendly / [employer] Patient application portion:  Mining engineer portion: Interoffice Mailed to Dr. Crawford Givens Provider address/fax verified via: Office website  Medication/Company: Mickel Fuchs / [employer] Patient application portion:  Mailed Provider application portion: Faxed  to Dr. Crawford Givens Provider address/fax verified via: Office website    Pawan Knechtel P. Slayde Brault, CPhT [facility] [employer]  [phone]

## 2023-01-19 NOTE — Assessment & Plan Note (Signed)
Statin intolerant 

## 2023-01-20 DIAGNOSIS — G4733 Obstructive sleep apnea (adult) (pediatric): Secondary | ICD-10-CM | POA: Diagnosis not present

## 2023-01-23 ENCOUNTER — Ambulatory Visit: Payer: Self-pay | Admitting: *Deleted

## 2023-01-23 NOTE — Patient Instructions (Signed)
Visit Information  Thank you for taking time to visit with me today. Please don't hesitate to contact me if I can be of assistance to you.   Following are the goals we discussed today:   Goals Addressed             This Visit's Progress    caregiver support       Care Coordination Interventions: Patient confirmed that she has filed a protective order against her son as of 01/18/23 Patient agreeable to calling 911 if in eminent danger Mental Health resources for patient and patient's grandson to be emailed  to patient for ongoing mental health follow up [email or web address]            If you are experiencing a Mental Health or Behavioral Health Crisis or need someone to talk to, please call 911   Patient verbalizes understanding of instructions and care plan provided today and agrees to view in MyChart. Active MyChart status and patient understanding of how to access instructions and care plan via MyChart confirmed with patient.     No further follow up required: patient to contact this Child psychotherapist with any additional community resource needs.  Verna Czech, LCSW Clinical Social Worker  [facility] Care Management [phone]

## 2023-01-23 NOTE — Patient Outreach (Signed)
  Care Coordination   Follow Up Visit Note   01/23/2023 Name: Allison Yoder MRN: [id number] DOB: 1951-08-26  Allison Yoder is a 72 y.o. year old female who sees Joaquim Nam, MD for primary care. I spoke with  Allison Yoder by phone today.  What matters to the patients health and wellness today?  Safety maintained and mental health resources for patient and her grandson.     Goals Addressed             This Visit's Progress    caregiver support       Care Coordination Interventions: Patient confirmed that she has filed a protective order against her son as of 01/18/23 Patient agreeable to calling 911 if in eminent danger Mental Health resources for patient and patient's grandson to be emailed  to patient for ongoing mental health follow up Allison Yoder           SDOH assessments and interventions completed:  No     Care Coordination Interventions:  Yes, provided  Interventions Today    Flowsheet Row Most Recent Value  Chronic Disease   Chronic disease during today's visit Hypertension (HTN)  General Interventions   General Interventions Discussed/Reviewed General Interventions Reviewed, Community Resources  Mental Health Interventions   Mental Health Discussed/Reviewed Mental Health Reviewed  [referrals for ongoing mental health counseling emailed again for patient and patient's grandson]  Safety Interventions   Safety Discussed/Reviewed Safety Reviewed  [patient confirms that as of 5/8 she has a protective order in place against son]       Follow up plan: No further intervention required.   Encounter Outcome:  Pt. Visit Completed

## 2023-01-25 ENCOUNTER — Encounter: Payer: Self-pay | Admitting: Ophthalmology

## 2023-01-26 ENCOUNTER — Telehealth: Payer: Self-pay | Admitting: Pharmacy Technician

## 2023-01-26 ENCOUNTER — Encounter: Payer: Self-pay | Admitting: Ophthalmology

## 2023-01-26 DIAGNOSIS — Z5986 Financial insecurity: Secondary | ICD-10-CM

## 2023-01-26 NOTE — Progress Notes (Signed)
[facility])                                            [facility] Quality Pharmacy Team    01/26/2023  Allison Yoder 1951-01-17 [id number]  Received both patient and provider portion(s) of patient assistance application(s) for Januvia. Mailed completed application and required documents into [employer].  NOTE: Re faxed to Dr. Lianne Bushy office the provider portion of the [employer] application for Lantus.  Allison Yoder, CPhT [facility] [employer]  [phone]

## 2023-01-26 NOTE — Anesthesia Preprocedure Evaluation (Addendum)
Anesthesia Evaluation  Patient identified by MRN, date of birth, ID band Patient awake    Reviewed: Allergy & Precautions, H&P , NPO status , Patient's Chart, lab work & pertinent test results, reviewed documented beta blocker date and time   Airway Mallampati: III  TM Distance: <3 FB Neck ROM: Full    Dental no notable dental hx.    Pulmonary sleep apnea and Continuous Positive Airway Pressure Ventilation , pneumonia, former smoker   Pulmonary exam normal breath sounds clear to auscultation       Cardiovascular hypertension, Pt. on home beta blockers and Pt. on medications Normal cardiovascular exam+ Valvular Problems/Murmurs  Rhythm:Regular Rate:Normal  12-16-20 stress test normal  09-08-22 EF 70-75%< LV hyperdynamic, mild concentric LHV; grade I LV diastolic dysfunction    Neuro/Psych  Headaches PSYCHIATRIC DISORDERS Anxiety Depression     Neuromuscular disease CVA    GI/Hepatic ,GERD  ,,(+) Hepatitis -NASH   Endo/Other  diabetesHypothyroidism    Renal/GU negative Renal ROS  negative genitourinary   Musculoskeletal  (+) Arthritis ,    Abdominal   Peds negative pediatric ROS (+)  Hematology  (+) Blood dyscrasia, anemia   Anesthesia Other Findings GERD (gastroesophageal reflux disease) Allergy Hypertension  Hyperlipidemia Urinary incontinence  Recurrent urinary tract infection Iritis  Diverticulosis Fatty liver  Snores Wears glasses  Anxiety Depression  Heart murmur OSA on CPAP Pneumonia Hypothyroidism  Type II diabetes mellitus (HCC) Anemia  Migraines Arthritis  Chronic lower back pain Stroke (HCC)  Wears hearing aid in both ears NASH (nonalcoholic steatohepatitis)    Reproductive/Obstetrics negative OB ROS                             Anesthesia Physical Anesthesia Plan  ASA: 3  Anesthesia Plan: MAC   Post-op Pain Management:    Induction: Intravenous  PONV  Risk Score and Plan:   Airway Management Planned: Natural Airway and Nasal Cannula  Additional Equipment:   Intra-op Plan:   Post-operative Plan:   Informed Consent: I have reviewed the patients History and Physical, chart, labs and discussed the procedure including the risks, benefits and alternatives for the proposed anesthesia with the patient or authorized representative who has indicated his/her understanding and acceptance.     Dental Advisory Given  Plan Discussed with: Anesthesiologist, CRNA and Surgeon  Anesthesia Plan Comments: (Patient consented for risks of anesthesia including but not limited to:  - adverse reactions to medications - damage to eyes, teeth, lips or other oral mucosa - nerve damage due to positioning  - sore throat or hoarseness - Damage to heart, brain, nerves, lungs, other parts of body or loss of life  Patient voiced understanding.)        Anesthesia Quick Evaluation

## 2023-01-27 NOTE — Discharge Instructions (Signed)

## 2023-01-31 ENCOUNTER — Encounter
Admission: RE | Disposition: A | Discharge status: Home / Self Care | Payer: Self-pay | Source: Ambulatory Visit | Attending: Ophthalmology

## 2023-01-31 ENCOUNTER — Ambulatory Visit: Payer: Medicare Other | Admitting: Anesthesiology

## 2023-01-31 ENCOUNTER — Encounter: Payer: Self-pay | Admitting: Ophthalmology

## 2023-01-31 ENCOUNTER — Other Ambulatory Visit: Payer: Self-pay

## 2023-01-31 ENCOUNTER — Ambulatory Visit
Admission: RE | Admit: 2023-01-31 | Discharge: 2023-01-31 | Disposition: A | Discharge status: Home / Self Care | Payer: Medicare Other | Source: Ambulatory Visit | Attending: Ophthalmology | Admitting: Ophthalmology

## 2023-01-31 DIAGNOSIS — Z7984 Long term (current) use of oral hypoglycemic drugs: Secondary | ICD-10-CM | POA: Diagnosis not present

## 2023-01-31 DIAGNOSIS — E1136 Type 2 diabetes mellitus with diabetic cataract: Secondary | ICD-10-CM | POA: Insufficient documentation

## 2023-01-31 DIAGNOSIS — H2512 Age-related nuclear cataract, left eye: Secondary | ICD-10-CM | POA: Diagnosis not present

## 2023-01-31 DIAGNOSIS — Z8249 Family history of ischemic heart disease and other diseases of the circulatory system: Secondary | ICD-10-CM | POA: Diagnosis not present

## 2023-01-31 DIAGNOSIS — Z833 Family history of diabetes mellitus: Secondary | ICD-10-CM | POA: Insufficient documentation

## 2023-01-31 DIAGNOSIS — I10 Essential (primary) hypertension: Secondary | ICD-10-CM | POA: Diagnosis not present

## 2023-01-31 DIAGNOSIS — H2511 Age-related nuclear cataract, right eye: Secondary | ICD-10-CM | POA: Insufficient documentation

## 2023-01-31 DIAGNOSIS — Z794 Long term (current) use of insulin: Secondary | ICD-10-CM | POA: Diagnosis not present

## 2023-01-31 DIAGNOSIS — Z87891 Personal history of nicotine dependence: Secondary | ICD-10-CM | POA: Diagnosis not present

## 2023-01-31 HISTORY — DX: Presence of external hearing-aid: Z97.4

## 2023-01-31 HISTORY — DX: Other ill-defined heart diseases: I51.89

## 2023-01-31 HISTORY — DX: Nonalcoholic steatohepatitis (NASH): K75.81

## 2023-01-31 HISTORY — PX: CATARACT EXTRACTION W/PHACO: SHX586

## 2023-01-31 LAB — GLUCOSE, CAPILLARY: Glucose-Capillary: 161 mg/dL — ABNORMAL HIGH (ref 70–99)

## 2023-01-31 SURGERY — PHACOEMULSIFICATION, CATARACT, WITH IOL INSERTION
Anesthesia: Monitor Anesthesia Care | Site: Eye | Laterality: Right

## 2023-01-31 MED ORDER — MOXIFLOXACIN HCL 0.5 % OP SOLN
OPHTHALMIC | Status: DC | PRN
  Administered 2023-01-31: .2 mL via OPHTHALMIC

## 2023-01-31 MED ORDER — ARMC OPHTHALMIC DILATING DROPS
1.0000 | OPHTHALMIC | Status: DC | PRN
  Administered 2023-01-31 (×3): 1 via OPHTHALMIC

## 2023-01-31 MED ORDER — SIGHTPATH DOSE#1 BSS IO SOLN
INTRAOCULAR | Status: DC | PRN
  Administered 2023-01-31: 58 mL via OPHTHALMIC

## 2023-01-31 MED ORDER — SIGHTPATH DOSE#1 BSS IO SOLN
INTRAOCULAR | Status: DC | PRN
  Administered 2023-01-31: 1 mL via INTRAMUSCULAR

## 2023-01-31 MED ORDER — TETRACAINE HCL 0.5 % OP SOLN
1.0000 [drp] | OPHTHALMIC | Status: DC | PRN
  Administered 2023-01-31 (×3): 1 [drp] via OPHTHALMIC

## 2023-01-31 MED ORDER — SIGHTPATH DOSE#1 NA CHONDROIT SULF-NA HYALURON 40-17 MG/ML IO SOLN
INTRAOCULAR | Status: DC | PRN
  Administered 2023-01-31: 1 mL via INTRAOCULAR

## 2023-01-31 MED ORDER — BRIMONIDINE TARTRATE-TIMOLOL 0.2-0.5 % OP SOLN
OPHTHALMIC | Status: DC | PRN
  Administered 2023-01-31: 1 [drp] via OPHTHALMIC

## 2023-01-31 MED ORDER — SIGHTPATH DOSE#1 BSS IO SOLN
INTRAOCULAR | Status: DC | PRN
  Administered 2023-01-31: 15 mL

## 2023-01-31 MED ORDER — MIDAZOLAM HCL 2 MG/2ML IJ SOLN
INTRAMUSCULAR | Status: DC | PRN
  Administered 2023-01-31: 2 mg via INTRAVENOUS

## 2023-01-31 SURGICAL SUPPLY — 9 items
ANGLE REVERSE CUT SHRT 25GA (CUTTER) ×1
CATARACT SUITE SIGHTPATH (MISCELLANEOUS) ×1 IMPLANT
CYSTOTOME ANGL RVRS SHRT 25G (CUTTER) ×1 IMPLANT
CYSTOTOME ANGL RVRS SHRT 25GA (CUTTER) ×1 IMPLANT
FEE CATARACT SUITE SIGHTPATH (MISCELLANEOUS) ×1 IMPLANT
LENS IOL TECNIS EYHANCE 22.5 (Intraocular Lens) IMPLANT
NDL FILTER BLUNT 18X1 1/2 (NEEDLE) ×1 IMPLANT
NEEDLE FILTER BLUNT 18X1 1/2 (NEEDLE) ×1 IMPLANT
SYR 3ML LL SCALE MARK (SYRINGE) ×1 IMPLANT

## 2023-01-31 NOTE — Transfer of Care (Signed)
Immediate Anesthesia Transfer of Care Note  Patient: Software engineer  Procedure(s) Performed: CATARACT EXTRACTION PHACO AND INTRAOCULAR LENS PLACEMENT (IOC) RIGHT DIABETIC  5.04  00:36.2 (Right: Eye)  Patient Location: PACU  Anesthesia Type: MAC  Level of Consciousness: awake, alert  and patient cooperative  Airway and Oxygen Therapy: Patient Spontanous Breathing and Patient connected to supplemental oxygen  Post-op Assessment: Post-op Vital signs reviewed, Patient's Cardiovascular Status Stable, Respiratory Function Stable, Patent Airway and No signs of Nausea or vomiting  Post-op Vital Signs: Reviewed and stable  Complications: No notable events documented.

## 2023-01-31 NOTE — Op Note (Signed)
PREOPERATIVE DIAGNOSIS:  Nuclear sclerotic cataract of the right eye.   POSTOPERATIVE DIAGNOSIS:  H25.11 Cataract   OPERATIVE PROCEDURE:ORPROCALL@   SURGEON:  Galen Manila, MD.   ANESTHESIA:  Anesthesiologist: Marisue Humble, MD CRNA: Domenic Moras, CRNA  1.      Managed anesthesia care. 2.      0.73ml of Shugarcaine was instilled in the eye following the paracentesis.   COMPLICATIONS:  None.   TECHNIQUE:   Stop and chop   DESCRIPTION OF PROCEDURE:  The patient was examined and consented in the preoperative holding area where the aforementioned topical anesthesia was applied to the right eye and then brought back to the Operating Room where the right eye was prepped and draped in the usual sterile ophthalmic fashion and a lid speculum was placed. A paracentesis was created with the side port blade and the anterior chamber was filled with viscoelastic. A near clear corneal incision was performed with the steel keratome. A continuous curvilinear capsulorrhexis was performed with a cystotome followed by the capsulorrhexis forceps. Hydrodissection and hydrodelineation were carried out with BSS on a blunt cannula. The lens was removed in a stop and chop  technique and the remaining cortical material was removed with the irrigation-aspiration handpiece. The capsular bag was inflated with viscoelastic and the Technis [id number]  lens was placed in the capsular bag without complication. The remaining viscoelastic was removed from the eye with the irrigation-aspiration handpiece. The wounds were hydrated. The anterior chamber was flushed with BSS and the eye was inflated to physiologic pressure. 0.29ml of Vigamox was placed in the anterior chamber. The wounds were found to be water tight. The eye was dressed with Combigan. The patient was given protective glasses to wear throughout the day and a shield with which to sleep tonight. The patient was also given drops with which to begin a drop regimen today  and will follow-up with me in one day. Implant Name Type Inv. Item Serial No. Manufacturer Lot No. LRB No. Used Action  LENS IOL TECNIS EYHANCE 22.5 - [id number] Intraocular Lens LENS IOL TECNIS EYHANCE 22.5 [id number] [employer]  Right 1 Implanted   Procedure(s) with comments: CATARACT EXTRACTION PHACO AND INTRAOCULAR LENS PLACEMENT (IOC) RIGHT DIABETIC  5.04  00:36.2 (Right) - Diabetic  Electronically signed: Galen Manila 01/31/2023 11:23 AM

## 2023-01-31 NOTE — Anesthesia Postprocedure Evaluation (Signed)
Anesthesia Post Note  Patient: Software engineer  Procedure(s) Performed: CATARACT EXTRACTION PHACO AND INTRAOCULAR LENS PLACEMENT (IOC) RIGHT DIABETIC  5.04  00:36.2 (Right: Eye)  Patient location during evaluation: PACU Anesthesia Type: MAC Level of consciousness: awake and alert Pain management: pain level controlled Vital Signs Assessment: post-procedure vital signs reviewed and stable Respiratory status: spontaneous breathing, nonlabored ventilation, respiratory function stable and patient connected to nasal cannula oxygen Cardiovascular status: stable and blood pressure returned to baseline Postop Assessment: no apparent nausea or vomiting Anesthetic complications: no   No notable events documented.   Last Vitals:  Vitals:   01/31/23 1125 01/31/23 1131  BP: 135/74 124/75  Pulse: 72 71  Resp: 18 18  Temp: 36.5 C (!) 36.4 C  SpO2: 95% 95%    Last Pain:  Vitals:   01/31/23 1125  TempSrc:   PainSc: 0-No pain                 Malorie Bigford C Jayleon Mcfarlane

## 2023-01-31 NOTE — H&P (Signed)
[facility]   Primary Care Physician:  Joaquim Nam, MD Ophthalmologist: Dr. Maren Reamer  Pre-Procedure History & Physical: HPI:  Allison Yoder is a 72 y.o. female here for cataract surgery.   Past Medical History:  Diagnosis Date   Allergy    Anemia    "all the time as a child"   Anxiety    Arthritis    "all my joints; worse in my hands" (10/27/2015)   Chronic lower back pain    Depression    Diverticulosis    Fatty liver    GERD (gastroesophageal reflux disease)    Grade I diastolic dysfunction    Heart murmur    "dx'd years ago; very mild; never treated" (10/27/2015)   Hyperlipidemia    Hypertension    Hypothyroidism 2002-~ 2010   Iritis    per [facility]   Migraines    "stopped when I went thru menopause"   NASH (nonalcoholic steatohepatitis)    OSA on CPAP    Pneumonia ~ 2013; 10/27/2015   Recurrent urinary tract infection    Routine general medical examination at a health care facility 02/26/2014   Snores    Stroke (HCC) 2017   TIAs x2.  No deficits   Type II diabetes mellitus (HCC)    "lost weight; started exercising again; don't have it anymore" (10/27/2015)   Urinary incontinence    Wears glasses    Wears hearing aid in both ears     Past Surgical History:  Procedure Laterality Date   BLEPHAROPLASTY Bilateral 2013; 2016   BREAST LUMPECTOMY Right 1964   benign tumor,   HEMORRHOID BANDING  X 2   KNEE ARTHROSCOPY Left 2016   "meniscus repair"   PELVIC FLOOR REPAIR  2001   "lift"   POLYPECTOMY  X 3   "bladder"   SHOULDER ARTHROSCOPY W/ ROTATOR CUFF REPAIR Left 2013   SHOULDER ARTHROSCOPY W/ ROTATOR CUFF REPAIR Right 2015   TUBAL LIGATION  ~ 1986   VAGINAL HYSTERECTOMY  1992   Partial    Prior to Admission medications   Medication Sig Start Date End Date Taking? Authorizing Provider  albuterol (PROVENTIL) (2.5 MG/3ML) 0.083% nebulizer solution Take 3 mLs (2.5 mg total) by nebulization every 6 (six) hours as needed for wheezing or  shortness of breath. 01/19/22  Yes Dugal, Wyatt Mage, FNP  citalopram (CELEXA) 20 MG tablet TAKE 1 TABLET(20 MG) BY MOUTH DAILY 11/07/22  Yes Joaquim Nam, MD  clonazePAM (KLONOPIN) 0.5 MG tablet Take 1 tablet (0.5 mg total) by mouth daily as needed for anxiety. 11/16/21  Yes Joaquim Nam, MD  cyanocobalamin 1000 MCG tablet Take 1,000 mcg by mouth daily.   Yes [provider]  cyclobenzaprine (FLEXERIL) 10 MG tablet Take 1 tablet (10 mg total) by mouth daily as needed for muscle spasms. 09/03/19  Yes Joaquim Nam, MD  Evolocumab (REPATHA SURECLICK) 140 MG/ML SOAJ Inject 140 mg into the skin every 14 (fourteen) days. 08/01/22  Yes Wendall Stade, MD  fluticasone (FLONASE) 50 MCG/ACT nasal spray Place 1 spray into both nostrils daily as needed for allergies or rhinitis.   Yes [provider]  insulin glargine (LANTUS SOLOSTAR) 100 UNIT/ML Solostar Pen Inject 5-20 Units into the skin at bedtime. 01/05/23  Yes Joaquim Nam, MD  JANUVIA 50 MG tablet TAKE 1 TABLET(50 MG) BY MOUTH DAILY 10/28/22  Yes Joaquim Nam, MD  lisinopril (ZESTRIL) 40 MG tablet TAKE 1 TABLET(40 MG) BY MOUTH DAILY  10/28/22  Yes Joaquim Nam, MD  metFORMIN (GLUCOPHAGE) 500 MG tablet Take 1 tablet (500 mg total) by mouth 2 (two) times daily with a meal. 11/17/22  Yes Joaquim Nam, MD  metoprolol succinate (TOPROL-XL) 50 MG 24 hr tablet TAKE 1 TABLET BY MOUTH DAILY WITH OR IMMEDIATELY FOLLOWING A MEAL 10/28/22  Yes Joaquim Nam, MD  Multiple Vitamin (MULTIVITAMIN) tablet Take 1 tablet by mouth daily.   Yes [provider]  omeprazole (PRILOSEC) 20 MG capsule Take 1 capsule (20 mg total) by mouth daily. 10/12/22  Yes Karie Schwalbe, MD  Vitamin D, Cholecalciferol, 1000 units CAPS Take 3,000 Units by mouth daily.   Yes [provider]  albuterol (VENTOLIN HFA) 108 (90 Base) MCG/ACT inhaler INHALE 1 TO 2 PUFFS INTO THE LUNGS EVERY 6 HOURS AS NEEDED FOR WHEEZING OR SHORTNESS OF BREATH  01/19/22   Mort Sawyers, FNP  Blood Glucose Monitoring Suppl (ONE TOUCH ULTRA 2) w/Device KIT Check blood sugar once daily and as instructed. Dx E11.9 12/29/16   Joaquim Nam, MD  glucose blood (ONETOUCH ULTRA) test strip as directed to check blood sugar daily. Dx E11.9 11/10/22   Joaquim Nam, MD  hydrocortisone (ANUSOL-HC) 25 MG suppository Place 1 suppository (25 mg total) rectally 2 (two) times daily. 06/30/22   Joaquim Nam, MD  Insulin Pen Needle (PEN NEEDLES) 32G X 5 MM MISC Use with insulin pen 01/05/23   Joaquim Nam, MD  Lancets [facility] DELICA PLUS [place]) MISC USE ONCE DAILY AS DIRECTED 11/15/22   Joaquim Nam, MD    Allergies as of 12/05/2022 - Review Complete 11/17/2022  Allergen Reaction Noted   Cephalexin Itching 12/16/2011   Codeine Itching and Rash 12/16/2011   Inapsine [droperidol] Shortness Of Breath, Anxiety, and Hypertension 03/13/2012   Crestor [rosuvastatin]  09/03/2019   Lipitor [atorvastatin calcium]  01/06/2012   Metformin and related  11/30/2022   Niacin and related  01/06/2012   Pravastatin  01/06/2012   Adhesive [tape] Rash 03/13/2012   Latex Rash 12/17/2020    Family History  Problem Relation Age of Onset   Arthritis Mother    Heart disease Mother    Hyperlipidemia Mother    Hypertension Mother    Diabetes Mother    Depression Mother    Stroke Mother    Alcohol abuse Father    Lung cancer Father    Arthritis Maternal Grandmother    Stroke Maternal Grandmother    Diabetes Maternal Grandmother    Heart disease Paternal Uncle        x 2   Arthritis Sister    Breast cancer Cousin    Heart disease Paternal Aunt    Diabetes Paternal Uncle    Diabetes Sister    Heart disease Paternal Uncle        x 5   Heart disease Paternal Aunt        x 3   Stroke Paternal Aunt    Dementia Paternal Grandmother    Colon cancer Neg Hx     Social History   Socioeconomic History   Marital status: Single    Spouse name: Not on file    Number of children: 1   Years of education: Not on file   Highest education level: Not on file  Occupational History   Occupation: Dental Receptionist/Assistant    Comment: Dr. Reuel Boom in [place]  Tobacco Use   Smoking status: Former    Packs/day: 2.00  Years: 5.00    Additional pack years: 0.00    Total pack years: 10.00    Types: Cigarettes    Quit date: 09/12/1985    Years since quitting: 37.4   Smokeless tobacco: Never  Vaping Use   Vaping Use: Never used  Substance and Sexual Activity   Alcohol use: Not Currently   Drug use: No   Sexual activity: Yes  Other Topics Concern   Not on file  Social History Narrative   Education:  Medical sales representative   Divorced and lives with her brother.     Caring for her grandson Jean Rosenthal) as of 2023   Social Determinants of Health   Financial Resource Strain: Medium Risk (09/09/2022)   Overall Financial Resource Strain (CARDIA)    Difficulty of Paying Living Expenses: Somewhat hard  Food Insecurity: No Food Insecurity (01/09/2023)   Hunger Vital Sign    Worried About Running Out of Food in the Last Year: Never true    Ran Out of Food in the Last Year: Never true  Transportation Needs: No Transportation Needs (01/09/2023)   PRAPARE - Administrator, Civil Service (Medical): No    Lack of Transportation (Non-Medical): No  Physical Activity: Inactive (09/09/2022)   Exercise Vital Sign    Days of Exercise per Week: 0 days    Minutes of Exercise per Session: 0 min  Stress: Stress Concern Present (09/09/2022)   [employer] of Occupational Health - Occupational Stress Questionnaire    Feeling of Stress : To some extent  Social Connections: Unknown (09/09/2022)   Social Connection and Isolation Panel [NHANES]    Frequency of Communication with Friends and Family: More than three times a week    Frequency of Social Gatherings with Friends and Family: Once a week    Attends Religious Services: Not on Administrator, sports or Organizations: No    Attends Banker Meetings: Never    Marital Status: Divorced  Catering manager Violence: Not At Risk (09/09/2022)   Humiliation, Afraid, Rape, and Kick questionnaire    Fear of Current or Ex-Partner: No    Emotionally Abused: No    Physically Abused: No    Sexually Abused: No    Review of Systems: See HPI, otherwise negative ROS  Physical Exam: Ht 5' 6.5" (1.689 m)   Wt 77.1 kg   BMI 27.03 kg/m  General:   Alert, cooperative in NAD Head:  Normocephalic and atraumatic. Respiratory:  Normal work of breathing. Cardiovascular:  RRR  Impression/Plan: Allison Yoder is here for cataract surgery.  Risks, benefits, limitations, and alternatives regarding cataract surgery have been reviewed with the patient.  Questions have been answered.  All parties agreeable.   Galen Manila, MD  01/31/2023, 10:49 AM

## 2023-02-01 DIAGNOSIS — H2512 Age-related nuclear cataract, left eye: Secondary | ICD-10-CM | POA: Diagnosis not present

## 2023-02-01 DIAGNOSIS — H2511 Age-related nuclear cataract, right eye: Secondary | ICD-10-CM | POA: Diagnosis not present

## 2023-02-02 ENCOUNTER — Telehealth: Payer: Self-pay | Admitting: Pharmacy Technician

## 2023-02-02 DIAGNOSIS — Z5986 Financial insecurity: Secondary | ICD-10-CM

## 2023-02-02 NOTE — Progress Notes (Addendum)
[facility])                                            [facility] Quality Pharmacy Team   02/23/2023-ADDENDUM  Had to resubmit application to [employer] per their request. [name]. [name] [facility]  [phone]  02/02/2023  Allison Yoder [birth year] [id number]  Received both patient and provider portion(s) of patient assistance application(s) for Lantus. Faxed completed application and required documents into [employer].    [name] P. [name], CPhT [facility] [employer]  [phone]

## 2023-02-07 NOTE — Anesthesia Preprocedure Evaluation (Addendum)
Anesthesia Evaluation  Patient identified by MRN, date of birth, ID band Patient awake    Reviewed: Allergy & Precautions, H&P , NPO status , Patient's Chart, lab work & pertinent test results  Airway Mallampati: II  TM Distance: >3 FB Neck ROM: Full    Dental no notable dental hx.    Pulmonary sleep apnea , pneumonia, former smoker   Pulmonary exam normal breath sounds clear to auscultation       Cardiovascular hypertension, Normal cardiovascular exam+ Valvular Problems/Murmurs  Rhythm:Regular Rate:Normal  Echo 09-08-22  1. Left ventricular ejection fraction, by estimation, is 70 to 75%. The  left ventricle has hyperdynamic function. The left ventricle has no  regional wall motion abnormalities. There is mild concentric left  ventricular hypertrophy. Left ventricular  diastolic parameters are consistent with Grade I diastolic dysfunction  (impaired relaxation).   2. Right ventricular systolic function is normal. The right ventricular  size is normal. Tricuspid regurgitation signal is inadequate for assessing  PA pressure.   3. The mitral valve is grossly normal. Trivial mitral valve  regurgitation.   4. The aortic valve is tricuspid. There is mild calcification of the  aortic valve. There is mild thickening of the aortic valve. Aortic valve  regurgitation is not visualized. Aortic valve sclerosis/calcification is  present, without any evidence of  aortic stenosis.   5. The inferior vena cava is normal in size with greater than 50%  respiratory variability, suggesting right atrial pressure of 3 mmHg.     Neuro/Psych  Headaches PSYCHIATRIC DISORDERS Anxiety Depression     Neuromuscular disease CVA    GI/Hepatic ,GERD  ,,(+) Hepatitis -  Endo/Other  diabetesHypothyroidism    Renal/GU   negative genitourinary   Musculoskeletal  (+) Arthritis ,    Abdominal   Peds negative pediatric ROS (+)  Hematology  (+) Blood  dyscrasia, anemia   Anesthesia Other Findings GERD (gastroesophageal reflux disease) Allergy Hypertension  Hyperlipidemia Urinary incontinence  Recurrent urinary tract infection Iritis  Diverticulosis Fatty liver  Snores Wears glasses Anxiety Depression Heart murmur OSA on CPAP Pneumonia Hypothyroidism Type II diabetes mellitus (HCC) Anemia  Migraines Arthritis  Chronic lower back pain Stroke Wears hearing aid in both ears NASH (nonalcoholic steatohepatitis) Grade I diastolic dysfunction     Reproductive/Obstetrics negative OB ROS                              Anesthesia Physical Anesthesia Plan  ASA: 3  Anesthesia Plan: MAC   Post-op Pain Management:    Induction: Intravenous  PONV Risk Score and Plan:   Airway Management Planned: Natural Airway and Nasal Cannula  Additional Equipment:   Intra-op Plan:   Post-operative Plan:   Informed Consent: I have reviewed the patients History and Physical, chart, labs and discussed the procedure including the risks, benefits and alternatives for the proposed anesthesia with the patient or authorized representative who has indicated his/her understanding and acceptance.     Dental Advisory Given  Plan Discussed with: Anesthesiologist, CRNA and Surgeon  Anesthesia Plan Comments: (Patient consented for risks of anesthesia including but not limited to:  - adverse reactions to medications - damage to eyes, teeth, lips or other oral mucosa - nerve damage due to positioning  - sore throat or hoarseness - Damage to heart, brain, nerves, lungs, other parts of body or loss of life  Patient voiced understanding.)         Anesthesia Quick Evaluation

## 2023-02-10 ENCOUNTER — Other Ambulatory Visit: Payer: Self-pay | Admitting: Internal Medicine

## 2023-02-10 NOTE — Discharge Instructions (Signed)

## 2023-02-14 ENCOUNTER — Encounter
Admission: RE | Disposition: A | Discharge status: Home / Self Care | Payer: Self-pay | Source: Ambulatory Visit | Attending: Ophthalmology

## 2023-02-14 ENCOUNTER — Ambulatory Visit: Payer: Medicare Other | Admitting: Anesthesiology

## 2023-02-14 ENCOUNTER — Other Ambulatory Visit: Payer: Self-pay

## 2023-02-14 ENCOUNTER — Ambulatory Visit
Admission: RE | Admit: 2023-02-14 | Discharge: 2023-02-14 | Disposition: A | Discharge status: Home / Self Care | Payer: Medicare Other | Source: Ambulatory Visit | Attending: Ophthalmology | Admitting: Ophthalmology

## 2023-02-14 ENCOUNTER — Encounter: Payer: Self-pay | Admitting: Ophthalmology

## 2023-02-14 DIAGNOSIS — Z8673 Personal history of transient ischemic attack (TIA), and cerebral infarction without residual deficits: Secondary | ICD-10-CM | POA: Insufficient documentation

## 2023-02-14 DIAGNOSIS — H2511 Age-related nuclear cataract, right eye: Secondary | ICD-10-CM | POA: Diagnosis not present

## 2023-02-14 DIAGNOSIS — F418 Other specified anxiety disorders: Secondary | ICD-10-CM | POA: Insufficient documentation

## 2023-02-14 DIAGNOSIS — Z87891 Personal history of nicotine dependence: Secondary | ICD-10-CM | POA: Insufficient documentation

## 2023-02-14 DIAGNOSIS — E1136 Type 2 diabetes mellitus with diabetic cataract: Secondary | ICD-10-CM | POA: Diagnosis not present

## 2023-02-14 DIAGNOSIS — D759 Disease of blood and blood-forming organs, unspecified: Secondary | ICD-10-CM | POA: Insufficient documentation

## 2023-02-14 DIAGNOSIS — G4733 Obstructive sleep apnea (adult) (pediatric): Secondary | ICD-10-CM | POA: Diagnosis not present

## 2023-02-14 DIAGNOSIS — D649 Anemia, unspecified: Secondary | ICD-10-CM | POA: Diagnosis not present

## 2023-02-14 DIAGNOSIS — H2512 Age-related nuclear cataract, left eye: Secondary | ICD-10-CM | POA: Insufficient documentation

## 2023-02-14 DIAGNOSIS — I10 Essential (primary) hypertension: Secondary | ICD-10-CM | POA: Insufficient documentation

## 2023-02-14 HISTORY — PX: CATARACT EXTRACTION W/PHACO: SHX586

## 2023-02-14 LAB — GLUCOSE, CAPILLARY: Glucose-Capillary: 143 mg/dL — ABNORMAL HIGH (ref 70–99)

## 2023-02-14 SURGERY — PHACOEMULSIFICATION, CATARACT, WITH IOL INSERTION
Anesthesia: Monitor Anesthesia Care | Site: Eye | Laterality: Left

## 2023-02-14 MED ORDER — ARMC OPHTHALMIC DILATING DROPS
1.0000 | OPHTHALMIC | Status: DC | PRN
  Administered 2023-02-14 (×3): 1 via OPHTHALMIC

## 2023-02-14 MED ORDER — SIGHTPATH DOSE#1 NA CHONDROIT SULF-NA HYALURON 40-17 MG/ML IO SOLN
INTRAOCULAR | Status: DC | PRN
  Administered 2023-02-14: 1 mL via INTRAOCULAR

## 2023-02-14 MED ORDER — SIGHTPATH DOSE#1 BSS IO SOLN
INTRAOCULAR | Status: DC | PRN
  Administered 2023-02-14: 65 mL via OPHTHALMIC

## 2023-02-14 MED ORDER — FENTANYL CITRATE (PF) 100 MCG/2ML IJ SOLN
INTRAMUSCULAR | Status: DC | PRN
  Administered 2023-02-14 (×2): 50 ug via INTRAVENOUS

## 2023-02-14 MED ORDER — BRIMONIDINE TARTRATE-TIMOLOL 0.2-0.5 % OP SOLN
OPHTHALMIC | Status: DC | PRN
  Administered 2023-02-14: 1 [drp] via OPHTHALMIC

## 2023-02-14 MED ORDER — TETRACAINE HCL 0.5 % OP SOLN
1.0000 [drp] | OPHTHALMIC | Status: DC | PRN
  Administered 2023-02-14 (×3): 1 [drp] via OPHTHALMIC

## 2023-02-14 MED ORDER — SIGHTPATH DOSE#1 BSS IO SOLN
INTRAOCULAR | Status: DC | PRN
  Administered 2023-02-14: 1 mL via INTRAMUSCULAR

## 2023-02-14 MED ORDER — MOXIFLOXACIN HCL 0.5 % OP SOLN
OPHTHALMIC | Status: DC | PRN
  Administered 2023-02-14: .2 mL via OPHTHALMIC

## 2023-02-14 MED ORDER — SIGHTPATH DOSE#1 BSS IO SOLN
INTRAOCULAR | Status: DC | PRN
  Administered 2023-02-14: 15 mL

## 2023-02-14 MED ORDER — MIDAZOLAM HCL 2 MG/2ML IJ SOLN
INTRAMUSCULAR | Status: DC | PRN
  Administered 2023-02-14: 2 mg via INTRAVENOUS

## 2023-02-14 SURGICAL SUPPLY — 10 items
ANGLE REVERSE CUT SHRT 25GA (CUTTER) ×1
CATARACT SUITE SIGHTPATH (MISCELLANEOUS) ×1 IMPLANT
CYSTOTOME ANGL RVRS SHRT 25G (CUTTER) ×1 IMPLANT
CYSTOTOME ANGL RVRS SHRT 25GA (CUTTER) ×1 IMPLANT
FEE CATARACT SUITE SIGHTPATH (MISCELLANEOUS) ×1 IMPLANT
GLOVE BIOGEL PI IND STRL 8 (GLOVE) IMPLANT
LENS IOL TECNIS EYHANCE 21.5 (Intraocular Lens) IMPLANT
NDL FILTER BLUNT 18X1 1/2 (NEEDLE) ×1 IMPLANT
NEEDLE FILTER BLUNT 18X1 1/2 (NEEDLE) ×1 IMPLANT
SYR 3ML LL SCALE MARK (SYRINGE) ×1 IMPLANT

## 2023-02-14 NOTE — Transfer of Care (Signed)
Immediate Anesthesia Transfer of Care Note  Patient: Software engineer  Procedure(s) Performed: CATARACT EXTRACTION PHACO AND INTRAOCULAR LENS PLACEMENT (IOC) LEFT DIABETIC  5.73  00:40.1 (Left: Eye)  Patient Location: PACU  Anesthesia Type: MAC  Level of Consciousness: awake, alert  and patient cooperative  Airway and Oxygen Therapy: Patient Spontanous Breathing and Patient connected to supplemental oxygen  Post-op Assessment: Post-op Vital signs reviewed, Patient's Cardiovascular Status Stable, Respiratory Function Stable, Patent Airway and No signs of Nausea or vomiting  Post-op Vital Signs: Reviewed and stable  Complications: No notable events documented.

## 2023-02-14 NOTE — H&P (Signed)
[facility]   Primary Care Physician:  Joaquim Nam, MD Ophthalmologist: Dr. Druscilla Brownie  Pre-Procedure History & Physical: HPI:  Allison Yoder is a 72 y.o. female here for cataract surgery.   Past Medical History:  Diagnosis Date   Allergy    Anemia    "all the time as a child"   Anxiety    Arthritis    "all my joints; worse in my hands" (10/27/2015)   Chronic lower back pain    Depression    Diverticulosis    Fatty liver    GERD (gastroesophageal reflux disease)    Grade I diastolic dysfunction    Heart murmur    "dx'd years ago; very mild; never treated" (10/27/2015)   Hyperlipidemia    Hypertension    Hypothyroidism 2002-~ 2010   Iritis    per [facility]   Migraines    "stopped when I went thru menopause"   NASH (nonalcoholic steatohepatitis)    OSA on CPAP    Pneumonia ~ 2013; 10/27/2015   Recurrent urinary tract infection    Routine general medical examination at a health care facility 02/26/2014   Snores    Stroke (HCC) 2017   TIAs x2.  No deficits   Type II diabetes mellitus (HCC)    "lost weight; started exercising again; don't have it anymore" (10/27/2015)   Urinary incontinence    Wears glasses    Wears hearing aid in both ears     Past Surgical History:  Procedure Laterality Date   BLEPHAROPLASTY Bilateral 2013; 2016   BREAST LUMPECTOMY Right 1964   benign tumor,   CATARACT EXTRACTION W/PHACO Right 01/31/2023   Procedure: CATARACT EXTRACTION PHACO AND INTRAOCULAR LENS PLACEMENT (IOC) RIGHT DIABETIC  5.04  00:36.2;  Surgeon: Galen Manila, MD;  Location: [facility];  Service: Ophthalmology;  Laterality: Right;  Diabetic   HEMORRHOID BANDING  X 2   KNEE ARTHROSCOPY Left 2016   "meniscus repair"   PELVIC FLOOR REPAIR  2001   "lift"   POLYPECTOMY  X 3   "bladder"   SHOULDER ARTHROSCOPY W/ ROTATOR CUFF REPAIR Left 2013   SHOULDER ARTHROSCOPY W/ ROTATOR CUFF REPAIR Right 2015   TUBAL LIGATION  ~ 1986   VAGINAL  HYSTERECTOMY  1992   Partial    Prior to Admission medications   Medication Sig Start Date End Date Taking? Authorizing Provider  albuterol (PROVENTIL) (2.5 MG/3ML) 0.083% nebulizer solution Take 3 mLs (2.5 mg total) by nebulization every 6 (six) hours as needed for wheezing or shortness of breath. 01/19/22  Yes Dugal, Wyatt Mage, FNP  albuterol (VENTOLIN HFA) 108 (90 Base) MCG/ACT inhaler INHALE 1 TO 2 PUFFS INTO THE LUNGS EVERY 6 HOURS AS NEEDED FOR WHEEZING OR SHORTNESS OF BREATH 01/19/22  Yes Dugal, Tabitha, FNP  Blood Glucose Monitoring Suppl (ONE TOUCH ULTRA 2) w/Device KIT Check blood sugar once daily and as instructed. Dx E11.9 12/29/16  Yes Joaquim Nam, MD  citalopram (CELEXA) 20 MG tablet TAKE 1 TABLET(20 MG) BY MOUTH DAILY 11/07/22  Yes Joaquim Nam, MD  clonazePAM (KLONOPIN) 0.5 MG tablet Take 1 tablet (0.5 mg total) by mouth daily as needed for anxiety. 11/16/21  Yes Joaquim Nam, MD  cyanocobalamin 1000 MCG tablet Take 1,000 mcg by mouth daily.   Yes [provider]  cyclobenzaprine (FLEXERIL) 10 MG tablet Take 1 tablet (10 mg total) by mouth daily as needed for muscle spasms. 09/03/19  Yes Joaquim Nam, MD  Evolocumab (  REPATHA SURECLICK) 140 MG/ML SOAJ Inject 140 mg into the skin every 14 (fourteen) days. 08/01/22  Yes Wendall Stade, MD  fluticasone (FLONASE) 50 MCG/ACT nasal spray Place 1 spray into both nostrils daily as needed for allergies or rhinitis.   Yes [provider]  glucose blood (ONETOUCH ULTRA) test strip as directed to check blood sugar daily. Dx E11.9 11/10/22  Yes Joaquim Nam, MD  hydrocortisone (ANUSOL-HC) 25 MG suppository Place 1 suppository (25 mg total) rectally 2 (two) times daily. 06/30/22  Yes Joaquim Nam, MD  insulin glargine (LANTUS SOLOSTAR) 100 UNIT/ML Solostar Pen Inject 5-20 Units into the skin at bedtime. 01/05/23  Yes Joaquim Nam, MD  Insulin Pen Needle (PEN NEEDLES) 32G X 5 MM MISC Use with insulin pen  01/05/23  Yes Joaquim Nam, MD  JANUVIA 50 MG tablet TAKE 1 TABLET(50 MG) BY MOUTH DAILY 10/28/22  Yes Joaquim Nam, MD  Lancets [facility] DELICA PLUS [place]) MISC USE ONCE DAILY AS DIRECTED 11/15/22  Yes Joaquim Nam, MD  lisinopril (ZESTRIL) 40 MG tablet TAKE 1 TABLET(40 MG) BY MOUTH DAILY 10/28/22  Yes Joaquim Nam, MD  metFORMIN (GLUCOPHAGE) 500 MG tablet Take 1 tablet (500 mg total) by mouth 2 (two) times daily with a meal. 11/17/22  Yes Joaquim Nam, MD  metoprolol succinate (TOPROL-XL) 50 MG 24 hr tablet TAKE 1 TABLET BY MOUTH DAILY WITH OR IMMEDIATELY FOLLOWING A MEAL 10/28/22  Yes Joaquim Nam, MD  Multiple Vitamin (MULTIVITAMIN) tablet Take 1 tablet by mouth daily.   Yes [provider]  omeprazole (PRILOSEC) 20 MG capsule TAKE 1 CAPSULE(20 MG) BY MOUTH DAILY 02/10/23  Yes Joaquim Nam, MD  Vitamin D, Cholecalciferol, 1000 units CAPS Take 3,000 Units by mouth daily.   Yes [provider]    Allergies as of 12/05/2022 - Review Complete 11/17/2022  Allergen Reaction Noted   Cephalexin Itching 12/16/2011   Codeine Itching and Rash 12/16/2011   Inapsine [droperidol] Shortness Of Breath, Anxiety, and Hypertension 03/13/2012   Crestor [rosuvastatin]  09/03/2019   Lipitor [atorvastatin calcium]  01/06/2012   Metformin and related  11/30/2022   Niacin and related  01/06/2012   Pravastatin  01/06/2012   Adhesive [tape] Rash 03/13/2012   Latex Rash 12/17/2020    Family History  Problem Relation Age of Onset   Arthritis Mother    Heart disease Mother    Hyperlipidemia Mother    Hypertension Mother    Diabetes Mother    Depression Mother    Stroke Mother    Alcohol abuse Father    Lung cancer Father    Arthritis Maternal Grandmother    Stroke Maternal Grandmother    Diabetes Maternal Grandmother    Heart disease Paternal Uncle        x 2   Arthritis Sister    Breast cancer Cousin    Heart disease Paternal Aunt    Diabetes Paternal  Uncle    Diabetes Sister    Heart disease Paternal Uncle        x 5   Heart disease Paternal Aunt        x 3   Stroke Paternal Aunt    Dementia Paternal Grandmother    Colon cancer Neg Hx     Social History   Socioeconomic History   Marital status: Single    Spouse name: Not on file   Number of children: 1   Years of education: Not on file  Highest education level: Not on file  Occupational History   Occupation: Dental Receptionist/Assistant    Comment: Dr. Reuel Boom in [place]  Tobacco Use   Smoking status: Former    Packs/day: 2.00    Years: 5.00    Additional pack years: 0.00    Total pack years: 10.00    Types: Cigarettes    Quit date: 09/12/1985    Years since quitting: 37.4   Smokeless tobacco: Never  Vaping Use   Vaping Use: Never used  Substance and Sexual Activity   Alcohol use: Not Currently   Drug use: No   Sexual activity: Yes  Other Topics Concern   Not on file  Social History Narrative   Education:  Medical sales representative   Divorced and lives with her brother.     Caring for her grandson Jean Rosenthal) as of 2023   Social Determinants of Health   Financial Resource Strain: Medium Risk (09/09/2022)   Overall Financial Resource Strain (CARDIA)    Difficulty of Paying Living Expenses: Somewhat hard  Food Insecurity: No Food Insecurity (01/09/2023)   Hunger Vital Sign    Worried About Running Out of Food in the Last Year: Never true    Ran Out of Food in the Last Year: Never true  Transportation Needs: No Transportation Needs (01/09/2023)   PRAPARE - Administrator, Civil Service (Medical): No    Lack of Transportation (Non-Medical): No  Physical Activity: Inactive (09/09/2022)   Exercise Vital Sign    Days of Exercise per Week: 0 days    Minutes of Exercise per Session: 0 min  Stress: Stress Concern Present (09/09/2022)   [employer] of Occupational Health - Occupational Stress Questionnaire    Feeling of Stress : To some extent   Social Connections: Unknown (09/09/2022)   Social Connection and Isolation Panel [NHANES]    Frequency of Communication with Friends and Family: More than three times a week    Frequency of Social Gatherings with Friends and Family: Once a week    Attends Religious Services: Not on Marketing executive or Organizations: No    Attends Banker Meetings: Never    Marital Status: Divorced  Catering manager Violence: Not At Risk (09/09/2022)   Humiliation, Afraid, Rape, and Kick questionnaire    Fear of Current or Ex-Partner: No    Emotionally Abused: No    Physically Abused: No    Sexually Abused: No    Review of Systems: See HPI, otherwise negative ROS  Physical Exam: BP 134/76   Pulse 70   Temp 98.1 F (36.7 C) (Temporal)   Resp 20   Ht 5' 6.5" (1.689 m)   Wt 81.8 kg   SpO2 95%   BMI 28.68 kg/m  General:   Alert, cooperative in NAD Head:  Normocephalic and atraumatic. Respiratory:  Normal work of breathing. Cardiovascular:  RRR  Impression/Plan: Easton V Morello is here for cataract surgery.  Risks, benefits, limitations, and alternatives regarding cataract surgery have been reviewed with the patient.  Questions have been answered.  All parties agreeable.   Galen Manila, MD  02/14/2023, 7:16 AM

## 2023-02-14 NOTE — Op Note (Signed)
PREOPERATIVE DIAGNOSIS:  Nuclear sclerotic cataract of the left eye.   POSTOPERATIVE DIAGNOSIS:  Nuclear sclerotic cataract of the left eye.   OPERATIVE PROCEDURE:ORPROCALL@   SURGEON:  Galen Manila, MD.   ANESTHESIA:  Anesthesiologist: Marisue Humble, MD CRNA: Barbette Hair, CRNA  1.      Managed anesthesia care. 2.     0.73ml of Shugarcaine was instilled following the paracentesis   COMPLICATIONS:  None.   TECHNIQUE:   Stop and chop   DESCRIPTION OF PROCEDURE:  The patient was examined and consented in the preoperative holding area where the aforementioned topical anesthesia was applied to the left eye and then brought back to the Operating Room where the left eye was prepped and draped in the usual sterile ophthalmic fashion and a lid speculum was placed. A paracentesis was created with the side port blade and the anterior chamber was filled with viscoelastic. A near clear corneal incision was performed with the steel keratome. A continuous curvilinear capsulorrhexis was performed with a cystotome followed by the capsulorrhexis forceps. Hydrodissection and hydrodelineation were carried out with BSS on a blunt cannula. The lens was removed in a stop and chop  technique and the remaining cortical material was removed with the irrigation-aspiration handpiece. The capsular bag was inflated with viscoelastic and the Technis [id number] lens was placed in the capsular bag without complication. The remaining viscoelastic was removed from the eye with the irrigation-aspiration handpiece. The wounds were hydrated. The anterior chamber was flushed with BSS and the eye was inflated to physiologic pressure. 0.38ml Vigamox was placed in the anterior chamber. The wounds were found to be water tight. The eye was dressed with Combigan. The patient was given protective glasses to wear throughout the day and a shield with which to sleep tonight. The patient was also given drops with which to begin a drop regimen  today and will follow-up with me in one day. Implant Name Type Inv. Item Serial No. Manufacturer Lot No. LRB No. Used Action  LENS IOL TECNIS EYHANCE 21.5 - [id number] Intraocular Lens LENS IOL TECNIS EYHANCE 21.5 [id number] [employer]  Left 1 Implanted    Procedure(s) with comments: CATARACT EXTRACTION PHACO AND INTRAOCULAR LENS PLACEMENT (IOC) LEFT DIABETIC  5.73  00:40.1 (Left) - Diabetic  Electronically signed: Galen Manila 02/14/2023 7:46 AM

## 2023-02-14 NOTE — Anesthesia Postprocedure Evaluation (Signed)
Anesthesia Post Note  Patient: Software engineer  Procedure(s) Performed: CATARACT EXTRACTION PHACO AND INTRAOCULAR LENS PLACEMENT (IOC) LEFT DIABETIC  5.73  00:40.1 (Left: Eye)  Patient location during evaluation: PACU Anesthesia Type: MAC Level of consciousness: awake and alert Pain management: pain level controlled Vital Signs Assessment: post-procedure vital signs reviewed and stable Respiratory status: spontaneous breathing, nonlabored ventilation, respiratory function stable and patient connected to nasal cannula oxygen Cardiovascular status: blood pressure returned to baseline and stable Postop Assessment: no apparent nausea or vomiting Anesthetic complications: no   No notable events documented.   Last Vitals:  Vitals:   02/14/23 0745 02/14/23 0751  BP: (!) 158/90 (!) 144/85  Pulse: 71   Resp: 18   Temp: (!) 36.4 C   SpO2: 99%     Last Pain:  Vitals:   02/14/23 0751  TempSrc:   PainSc: 0-No pain                 Zarie Kosiba C Paxon Propes

## 2023-02-15 ENCOUNTER — Encounter: Payer: Self-pay | Admitting: Ophthalmology

## 2023-02-21 ENCOUNTER — Encounter: Payer: Self-pay | Admitting: Family Medicine

## 2023-02-22 NOTE — Telephone Encounter (Signed)
Please see if this patient will qualify for Dexcom or other continuous glucose monitor since she is treated with insulin.  If so, please send.

## 2023-03-06 DIAGNOSIS — Z961 Presence of intraocular lens: Secondary | ICD-10-CM | POA: Diagnosis not present

## 2023-03-08 ENCOUNTER — Telehealth: Payer: Self-pay | Admitting: Pharmacy Technician

## 2023-03-08 DIAGNOSIS — Z5986 Financial insecurity: Secondary | ICD-10-CM

## 2023-03-08 NOTE — Progress Notes (Signed)
[facility])                                            [facility] Quality Pharmacy Team    03/08/2023  CREEDENCE HEISS Feb 02, 1951 [id number]  Two care coordination calls placed today to [employer] in regard to Januvia and to [employer] in regard to Lantus.   1st call placed to [employer] for Januvia application. Spoke to [place] at [employer] who informs patient is APPROVED 03/07/23-09/12/23 for Januvia. Initial shipment will automatically process and ship to the patient's home. Subsequent refills will need to be phoned in by the patient. The patient can either call [employer] at [phone] or [employer]) at [phone]. Patient will need to call when she has about a 14 day supply remaining to allow for processing and delivery.  2nd call placed to [employer] for Lantus application. Spoke to T[place]a who informs patient is APPROVED 03/02/23-09/12/23. Shipments and refills are processed automatically to the prescriber's office.The patient can call [employer] if she feels current supply is not sufficient enough until next automate delivery. The number is [phone]. Of note, if provider changes the order, then MD will need to call [employer] at [phone] and request a change request form be faxed to their office for completion by MD and then faxed back to [facility] for processing and delivery of new dose.  Pattricia Boss, CPhT [facility] Office: [phone] Fax: [phone] Email: [email or web address]

## 2023-03-14 DIAGNOSIS — E119 Type 2 diabetes mellitus without complications: Secondary | ICD-10-CM | POA: Diagnosis not present

## 2023-03-14 DIAGNOSIS — I10 Essential (primary) hypertension: Secondary | ICD-10-CM | POA: Diagnosis not present

## 2023-03-14 DIAGNOSIS — F325 Major depressive disorder, single episode, in full remission: Secondary | ICD-10-CM | POA: Diagnosis not present

## 2023-03-14 DIAGNOSIS — R32 Unspecified urinary incontinence: Secondary | ICD-10-CM | POA: Diagnosis not present

## 2023-03-14 DIAGNOSIS — M1990 Unspecified osteoarthritis, unspecified site: Secondary | ICD-10-CM | POA: Diagnosis not present

## 2023-03-20 ENCOUNTER — Encounter: Payer: Self-pay | Admitting: Physician Assistant

## 2023-03-20 ENCOUNTER — Ambulatory Visit: Payer: Medicare Other | Admitting: Physician Assistant

## 2023-03-20 VITALS — BP 140/64 | HR 88 | Ht 66.5 in | Wt 185.0 lb

## 2023-03-20 DIAGNOSIS — R0789 Other chest pain: Secondary | ICD-10-CM

## 2023-03-20 DIAGNOSIS — R0600 Dyspnea, unspecified: Secondary | ICD-10-CM | POA: Diagnosis not present

## 2023-03-20 DIAGNOSIS — I10 Essential (primary) hypertension: Secondary | ICD-10-CM

## 2023-03-20 DIAGNOSIS — M25551 Pain in right hip: Secondary | ICD-10-CM

## 2023-03-20 NOTE — Patient Instructions (Signed)
Medication Instructions:  Your physician recommends that you continue on your current medications as directed. Please refer to the Current Medication list given to you today.  *If you need a refill on your cardiac medications before your next appointment, please call your pharmacy*  Lab Work: None ordered If you have labs (blood work) drawn today and your tests are completely normal, you will receive your results only by: MyChart Message (if you have MyChart) OR A paper copy in the mail If you have any lab test that is abnormal or we need to change your treatment, we will call you to review the results.  Follow-Up: At [facility], you and your health needs are our priority.  As part of our continuing mission to provide you with exceptional heart care, we have created designated Provider Care Teams.  These Care Teams include your primary Cardiologist (physician) and Advanced Practice Providers (APPs -  Physician Assistants and Nurse Practitioners) who all work together to provide you with the care you need, when you need it.  Your next appointment:   1 year(s)  Provider:   Charlton Haws, MD   Other Instructions Check your blood pressure daily, 1 hr after morning medications and keep a log of your readings  Heart-Healthy Eating Plan Many factors influence your heart health, including eating and exercise habits. Heart health is also called coronary health. Coronary risk increases with abnormal blood fat (lipid) levels. A heart-healthy eating plan includes limiting unhealthy fats, increasing healthy fats, limiting salt (sodium) intake, and making other diet and lifestyle changes. What is my plan? Your health care provider may recommend that: You limit your fat intake to _________% or less of your total calories each day. You limit your saturated fat intake to _________% or less of your total calories each day. You limit the amount of cholesterol in your diet to less than _________  mg per day. You limit the amount of sodium in your diet to less than _________ mg per day. What are tips for following this plan? Cooking Cook foods using methods other than frying. Baking, boiling, grilling, and broiling are all good options. Other ways to reduce fat include: Removing the skin from poultry. Removing all visible fats from meats. Steaming vegetables in water or broth. Meal planning  At meals, imagine dividing your plate into fourths: Fill one-half of your plate with vegetables and green salads. Fill one-fourth of your plate with whole grains. Fill one-fourth of your plate with lean protein foods. Eat 2-4 cups of vegetables per day. One cup of vegetables equals 1 cup (91 g) broccoli or cauliflower florets, 2 medium carrots, 1 large bell pepper, 1 large sweet potato, 1 large tomato, 1 medium white potato, 2 cups (150 g) raw leafy greens. Eat 1-2 cups of fruit per day. One cup of fruit equals 1 small apple, 1 large banana, 1 cup (237 g) mixed fruit, 1 large orange,  cup (82 g) dried fruit, 1 cup (240 mL) 100% fruit juice. Eat more foods that contain soluble fiber. Examples include apples, broccoli, carrots, beans, peas, and barley. Aim to get 25-30 g of fiber per day. Increase your consumption of legumes, nuts, and seeds to 4-5 servings per week. One serving of dried beans or legumes equals  cup (90 g) cooked, 1 serving of nuts is  oz (12 almonds, 24 pistachios, or 7 walnut halves), and 1 serving of seeds equals  oz (8 g). Fats Choose healthy fats more often. Choose monounsaturated and polyunsaturated fats,  such as olive and canola oils, avocado oil, flaxseeds, walnuts, almonds, and seeds. Eat more omega-3 fats. Choose salmon, mackerel, sardines, tuna, flaxseed oil, and ground flaxseeds. Aim to eat fish at least 2 times each week. Check food labels carefully to identify foods with trans fats or high amounts of saturated fat. Limit saturated fats. These are found in animal  products, such as meats, butter, and cream. Plant sources of saturated fats include palm oil, palm kernel oil, and coconut oil. Avoid foods with partially hydrogenated oils in them. These contain trans fats. Examples are stick margarine, some tub margarines, cookies, crackers, and other baked goods. Avoid fried foods. General information Eat more home-cooked food and less restaurant, buffet, and fast food. Limit or avoid alcohol. Limit foods that are high in added sugar and simple starches such as foods made using white refined flour (white breads, pastries, sweets). Lose weight if you are overweight. Losing just 5-10% of your body weight can help your overall health and prevent diseases such as diabetes and heart disease. Monitor your sodium intake, especially if you have high blood pressure. Talk with your health care provider about your sodium intake. Try to incorporate more vegetarian meals weekly. What foods should I eat? Fruits All fresh, canned (in natural juice), or frozen fruits. Vegetables Fresh or frozen vegetables (raw, steamed, roasted, or grilled). Green salads. Grains Most grains. Choose whole wheat and whole grains most of the time. Rice and pasta, including brown rice and pastas made with whole wheat. Meats and other proteins Lean, well-trimmed beef, veal, pork, and lamb. Chicken and [place] without skin. All fish and shellfish. Wild duck, rabbit, pheasant, and venison. Egg whites or low-cholesterol egg substitutes. Dried beans, peas, lentils, and tofu. Seeds and most nuts. Dairy Low-fat or nonfat cheeses, including ricotta and mozzarella. Skim or 1% milk (liquid, powdered, or evaporated). Buttermilk made with low-fat milk. Nonfat or low-fat yogurt. Fats and oils Non-hydrogenated (trans-free) margarines. Vegetable oils, including soybean, sesame, sunflower, olive, avocado, peanut, safflower, corn, canola, and cottonseed. Salad dressings or mayonnaise made with a vegetable  oil. Beverages Water (mineral or sparkling). Coffee and tea. Unsweetened ice tea. Diet beverages. Sweets and desserts Sherbet, gelatin, and fruit ice. Small amounts of dark chocolate. Limit all sweets and desserts. Seasonings and condiments All seasonings and condiments. The items listed above may not be a complete list of foods and beverages you can eat. Contact a dietitian for more options. What foods should I avoid? Fruits Canned fruit in heavy syrup. Fruit in cream or butter sauce. Fried fruit. Limit coconut. Vegetables Vegetables cooked in cheese, cream, or butter sauce. Fried vegetables. Grains Breads made with saturated or trans fats, oils, or whole milk. Croissants. Sweet rolls. Donuts. High-fat crackers, such as cheese crackers and chips. Meats and other proteins Fatty meats, such as hot dogs, ribs, sausage, bacon, rib-eye roast or steak. High-fat deli meats, such as salami and bologna. Caviar. Domestic duck and goose. Organ meats, such as liver. Dairy Cream, sour cream, cream cheese, and creamed cottage cheese. Whole-milk cheeses. Whole or 2% milk (liquid, evaporated, or condensed). Whole buttermilk. Cream sauce or high-fat cheese sauce. Whole-milk yogurt. Fats and oils Meat fat, or shortening. Cocoa butter, hydrogenated oils, palm oil, coconut oil, palm kernel oil. Solid fats and shortenings, including bacon fat, salt pork, lard, and butter. Nondairy cream substitutes. Salad dressings with cheese or sour cream. Beverages Regular sodas and any drinks with added sugar. Sweets and desserts Frosting. Pudding. Cookies. Cakes. Pies. Milk chocolate or white chocolate.  Buttered syrups. Full-fat ice cream or ice cream drinks. The items listed above may not be a complete list of foods and beverages to avoid. Contact a dietitian for more information. Summary Heart-healthy meal planning includes limiting unhealthy fats, increasing healthy fats, limiting salt (sodium) intake and making  other diet and lifestyle changes. Lose weight if you are overweight. Losing just 5-10% of your body weight can help your overall health and prevent diseases such as diabetes and heart disease. Focus on eating a balance of foods, including fruits and vegetables, low-fat or nonfat dairy, lean protein, nuts and legumes, whole grains, and heart-healthy oils and fats. This information is not intended to replace advice given to you by your health care provider. Make sure you discuss any questions you have with your health care provider. Document Revised: 10/04/2021 Document Reviewed: 10/04/2021 Elsevier Patient Education  2024 Elsevier Inc. Low-Sodium Eating Plan Salt (sodium) helps you keep a healthy balance of fluids in your body. Too much sodium can raise your blood pressure. It can also cause fluid and waste to be held in your body. Your health care provider or dietitian may recommend a low-sodium eating plan if you have high blood pressure (hypertension), kidney disease, liver disease, or heart failure. Eating less sodium can help lower your blood pressure and reduce swelling. It can also protect your heart, liver, and kidneys. What are tips for following this plan? Reading food labels  Check food labels for the amount of sodium per serving. If you eat more than one serving, you must multiply the listed amount by the number of servings. Choose foods with less than 140 milligrams (mg) of sodium per serving. Avoid foods with 300 mg of sodium or more per serving. Always check how much sodium is in a product, even if the label says "unsalted" or "no salt added." Shopping  Buy products labeled as "low-sodium" or "no salt added." Buy fresh foods. Avoid canned foods and pre-made or frozen meals. Avoid canned, cured, or processed meats. Buy breads that have less than 80 mg of sodium per slice. Cooking  Eat more home-cooked food. Try to eat less restaurant, buffet, and fast food. Try not to add salt  when you cook. Use salt-free seasonings or herbs instead of table salt or sea salt. Check with your provider or pharmacist before using salt substitutes. Cook with plant-based oils, such as canola, sunflower, or olive oil. Meal planning When eating at a restaurant, ask if your food can be made with less salt or no salt. Avoid dishes labeled as brined, pickled, cured, or smoked. Avoid dishes made with soy sauce, miso, or teriyaki sauce. Avoid foods that have monosodium glutamate (MSG) in them. MSG may be added to some restaurant food, sauces, soups, bouillon, and canned foods. Make meals that can be grilled, baked, poached, roasted, or steamed. These are often made with less sodium. General information Try to limit your sodium intake to 1,500-2,300 mg each day, or the amount told by your provider. What foods should I eat? Fruits Fresh, frozen, or canned fruit. Fruit juice. Vegetables Fresh or frozen vegetables. "No salt added" canned vegetables. "No salt added" tomato sauce and paste. Low-sodium or reduced-sodium tomato and vegetable juice. Grains Low-sodium cereals, such as oats, puffed wheat and rice, and shredded wheat. Low-sodium crackers. Unsalted rice. Unsalted pasta. Low-sodium bread. Whole grain breads and whole grain pasta. Meats and other proteins Fresh or frozen meat, poultry, seafood, and fish. These should have no added salt. Low-sodium canned tuna and salmon.  Unsalted nuts. Dried peas, beans, and lentils without added salt. Unsalted canned beans. Eggs. Unsalted nut butters. Dairy Milk. Soy milk. Cheese that is naturally low in sodium, such as ricotta cheese, fresh mozzarella, or Swiss cheese. Low-sodium or reduced-sodium cheese. Cream cheese. Yogurt. Seasonings and condiments Fresh and dried herbs and spices. Salt-free seasonings. Low-sodium mustard and ketchup. Sodium-free salad dressing. Sodium-free light mayonnaise. Fresh or refrigerated horseradish. Lemon juice. Vinegar. Other  foods Homemade, reduced-sodium, or low-sodium soups. Unsalted popcorn and pretzels. Low-salt or salt-free chips. The items listed above may not be all the foods and drinks you can have. Talk to a dietitian to learn more. What foods should I avoid? Vegetables Sauerkraut, pickled vegetables, and relishes. Olives. [place] fries. Onion rings. Regular canned vegetables, except low-sodium or reduced-sodium items. Regular canned tomato sauce and paste. Regular tomato and vegetable juice. Frozen vegetables in sauces. Grains Instant hot cereals. Bread stuffing, pancake, and biscuit mixes. Croutons. Seasoned rice or pasta mixes. Noodle soup cups. Boxed or frozen macaroni and cheese. Regular salted crackers. Self-rising flour. Meats and other proteins Meat or fish that is salted, canned, smoked, spiced, or pickled. Precooked or cured meat, such as sausages or meat loaves. Tomasa Blase. Ham. Pepperoni. Hot dogs. Corned beef. Chipped beef. Salt pork. Jerky. Pickled herring, anchovies, and sardines. Regular canned tuna. Salted nuts. Dairy Processed cheese and cheese spreads. Hard cheeses. Cheese curds. Blue cheese. Feta cheese. String cheese. Regular cottage cheese. Buttermilk. Canned milk. Fats and oils Salted butter. Regular margarine. Ghee. Bacon fat. Seasonings and condiments Onion salt, garlic salt, seasoned salt, table salt, and sea salt. Canned and packaged gravies. Worcestershire sauce. Tartar sauce. Barbecue sauce. Teriyaki sauce. Soy sauce, including reduced-sodium soy sauce. Steak sauce. Fish sauce. Oyster sauce. Cocktail sauce. Horseradish that you find on the shelf. Regular ketchup and mustard. Meat flavorings and tenderizers. Bouillon cubes. Hot sauce. Pre-made or packaged marinades. Pre-made or packaged taco seasonings. Relishes. Regular salad dressings. Salsa. Other foods Salted popcorn and pretzels. Corn chips and puffs. Potato and tortilla chips. Canned or dried soups. Pizza. Frozen entrees and pot  pies. The items listed above may not be all the foods and drinks you should avoid. Talk to a dietitian to learn more. This information is not intended to replace advice given to you by your health care provider. Make sure you discuss any questions you have with your health care provider. Document Revised: 09/15/2022 Document Reviewed: 09/15/2022 Elsevier Patient Education  2024 [employer].

## 2023-03-20 NOTE — Progress Notes (Signed)
Cardiology Office Note:  .   Date:  03/20/2023  ID:  Allison Yoder, DOB 1951-04-10, MRN [id number] PCP: Joaquim Nam, MD  [facility] Providers Cardiologist:  Charlton Haws, MD {  History of Present Illness: .   Allison Yoder is a 72 y.o. female with past medical history of chest pain, normal ETT 12/14/2016, calcium score elevated at 172 on statin, hypertension, diabetes mellitus who is here for follow-up appointment.  History includes some atypical chest pain back in April 2022 with follow-up Myoview 12/16/2020 showing no ischemia and LVEF 76%. Echo from 01/26/16 normal 65-70% done for TIA carotid 5/17 plaque RICA no stenosis.   CVA 5/17 lacunar with small area of acute infarct in left amygdala and tail of hippocampus.   Today, she shares that it has been an extremely stressful year. Son is addicted to drugs and she looks after his son who is 80 years old.  She has not any further chest pains.  She does suffer with shortness of breath when going uphill.  This has not changed in the last 6 to 8 months.  She works as an Industrial/product designer and works long hours.  She has trouble finding time to exercise.  We discussed low-sodium, heart healthy diet as well as increasing exercise to further optimize her health.   Reports no shortness of breath nor dyspnea on exertion. Reports no chest pain, pressure, or tightness. No edema, orthopnea, PND. Reports no palpitations.   ROS: Pertinent ROS in HPI  Studies Reviewed: .       Echo 09/08/22 IMPRESSIONS     1. Left ventricular ejection fraction, by estimation, is 70 to 75%. The  left ventricle has hyperdynamic function. The left ventricle has no  regional wall motion abnormalities. There is mild concentric left  ventricular hypertrophy. Left ventricular  diastolic parameters are consistent with Grade I diastolic dysfunction  (impaired relaxation).   2. Right ventricular systolic function is normal. The right  ventricular  size is normal. Tricuspid regurgitation signal is inadequate for assessing  PA pressure.   3. The mitral valve is grossly normal. Trivial mitral valve  regurgitation.   4. The aortic valve is tricuspid. There is mild calcification of the  aortic valve. There is mild thickening of the aortic valve. Aortic valve  regurgitation is not visualized. Aortic valve sclerosis/calcification is  present, without any evidence of  aortic stenosis.   5. The inferior vena cava is normal in size with greater than 50%  respiratory variability, suggesting right atrial pressure of 3 mmHg.   Comparison(s): Compared to prior TTE report in 2017, there is no  significant change.   FINDINGS   Left Ventricle: Left ventricular ejection fraction, by estimation, is 70  to 75%. The left ventricle has hyperdynamic function. The left ventricle  has no regional wall motion abnormalities. The left ventricular internal  cavity size was normal in size.  There is mild concentric left ventricular hypertrophy. Left ventricular  diastolic parameters are consistent with Grade I diastolic dysfunction  (impaired relaxation).   Right Ventricle: The right ventricular size is normal. No increase in  right ventricular wall thickness. Right ventricular systolic function is  normal. Tricuspid regurgitation signal is inadequate for assessing PA  pressure.   Left Atrium: Left atrial size was normal in size.   Right Atrium: Right atrial size was normal in size.   Pericardium: There is no evidence of pericardial effusion.   Mitral Valve: The mitral valve is  grossly normal. There is mild thickening  of the mitral valve leaflet(s). There is mild calcification of the mitral  valve leaflet(s). Trivial mitral valve regurgitation.   Tricuspid Valve: The tricuspid valve is normal in structure. Tricuspid  valve regurgitation is trivial.   Aortic Valve: The aortic valve is tricuspid. There is mild calcification  of the  aortic valve. There is mild thickening of the aortic valve. Aortic  valve regurgitation is not visualized. Aortic valve  sclerosis/calcification is present, without any  evidence of aortic stenosis.   Pulmonic Valve: The pulmonic valve was not well visualized. Pulmonic valve  regurgitation is trivial.   Aorta: The aortic root and ascending aorta are structurally normal, with  no evidence of dilitation.   Venous: The inferior vena cava is normal in size with greater than 50%  respiratory variability, suggesting right atrial pressure of 3 mmHg.   IAS/Shunts: The atrial septum is grossly normal.       Physical Exam:   VS:  There were no vitals taken for this visit.   Wt Readings from Last 3 Encounters:  02/14/23 180 lb 6.4 oz (81.8 kg)  01/31/23 177 lb 11.2 oz (80.6 kg)  01/05/23 176 lb (79.8 kg)    GEN: Well nourished, well developed in no acute distress NECK: No JVD; No carotid bruits CARDIAC: RRR, no murmurs, rubs, gallops RESPIRATORY:  Clear to auscultation without rales, wheezing or rhonchi  ABDOMEN: Soft, non-tender, non-distended EXTREMITIES:  No edema; No deformity   ASSESSMENT AND PLAN: .   chest pain -Resolved -Issues resolved after decreasing metformin, likely GI related -Continue Repatha 140 mg/mL every 2 weeks, lisinopril 40 mg daily, metoprolol succinate 50 mg daily  HTN -better at home, slightly higher in the office today 140/64 -continue current medications  Left index finger going numb -She thought that this was neuropathy however, seems to be positional. -Not likely cardiac in nature  Right hip pain -This has resolved and it was found to be a bone spur.  She did some physical exercises and eventually pain went away, did not need surgery  SOB  -when she goes up hills, not getting worse -Last echocardiogram reviewed with the patient with normal heart pump function       Dispo: She can follow-up in a year with Dr. Eden Emms  Signed, Sharlene Dory,  PA-C

## 2023-03-22 DIAGNOSIS — M9903 Segmental and somatic dysfunction of lumbar region: Secondary | ICD-10-CM | POA: Diagnosis not present

## 2023-03-22 DIAGNOSIS — M9905 Segmental and somatic dysfunction of pelvic region: Secondary | ICD-10-CM | POA: Diagnosis not present

## 2023-03-22 DIAGNOSIS — M5136 Other intervertebral disc degeneration, lumbar region: Secondary | ICD-10-CM | POA: Diagnosis not present

## 2023-03-22 DIAGNOSIS — M9904 Segmental and somatic dysfunction of sacral region: Secondary | ICD-10-CM | POA: Diagnosis not present

## 2023-03-28 DIAGNOSIS — K7460 Unspecified cirrhosis of liver: Secondary | ICD-10-CM | POA: Diagnosis not present

## 2023-03-28 DIAGNOSIS — K7581 Nonalcoholic steatohepatitis (NASH): Secondary | ICD-10-CM | POA: Diagnosis not present

## 2023-03-28 DIAGNOSIS — K760 Fatty (change of) liver, not elsewhere classified: Secondary | ICD-10-CM | POA: Diagnosis not present

## 2023-04-12 ENCOUNTER — Encounter (INDEPENDENT_AMBULATORY_CARE_PROVIDER_SITE_OTHER): Payer: Self-pay

## 2023-04-20 DIAGNOSIS — G4733 Obstructive sleep apnea (adult) (pediatric): Secondary | ICD-10-CM | POA: Diagnosis not present

## 2023-04-22 DIAGNOSIS — G4733 Obstructive sleep apnea (adult) (pediatric): Secondary | ICD-10-CM | POA: Diagnosis not present

## 2023-04-28 ENCOUNTER — Encounter: Payer: Self-pay | Admitting: Family Medicine

## 2023-04-28 ENCOUNTER — Ambulatory Visit: Payer: Medicare Other | Admitting: Family Medicine

## 2023-04-28 VITALS — BP 144/68 | HR 103 | Temp 97.7°F | Ht 66.5 in | Wt 188.0 lb

## 2023-04-28 DIAGNOSIS — R202 Paresthesia of skin: Secondary | ICD-10-CM | POA: Diagnosis not present

## 2023-04-28 DIAGNOSIS — M542 Cervicalgia: Secondary | ICD-10-CM | POA: Diagnosis not present

## 2023-04-28 DIAGNOSIS — E119 Type 2 diabetes mellitus without complications: Secondary | ICD-10-CM | POA: Diagnosis not present

## 2023-04-28 LAB — POCT GLYCOSYLATED HEMOGLOBIN (HGB A1C): Hemoglobin A1C: 7 % — AB (ref 4.0–5.6)

## 2023-04-28 MED ORDER — LANTUS SOLOSTAR 100 UNIT/ML ~~LOC~~ SOPN: 50.0000 [IU] | PEN_INJECTOR | Freq: Every day | SUBCUTANEOUS | Status: DC

## 2023-04-28 NOTE — Patient Instructions (Addendum)
Let me check on insulin supply options.  Use an OTC wrist brace at night.  Neck PT.  You should get a call about that.   Update me as needed.  Take care.  Glad to see you.

## 2023-04-28 NOTE — Progress Notes (Unsigned)
Grandson started school this week at [employer].  She has a restraining order against her son.  Discussed.    L 2nd finger paresthesia, going on for months.  Neck is sore at baseline.  No weakness.  No R handed sx except for transient R 1st-3rd finger paresthesia in the AM.  No trauma.    Taking 50 units insulin.  D/w pt that I would check with pharmacy here about med assistance with updated insulin dose.  A1c improved, d/w pt at OV.    Meds, vitals, and allergies reviewed.   ROS: Per HPI unless specifically indicated in ROS section   Nad Ncat Neck sore but not stiff.   L 2nd finger altered sensation monofilament.  Normal grip and cap refill B.  Tinel neg B.  Normal gross strength BUE

## 2023-04-30 DIAGNOSIS — R202 Paresthesia of skin: Secondary | ICD-10-CM | POA: Insufficient documentation

## 2023-04-30 NOTE — Assessment & Plan Note (Signed)
Taking 50 units insulin.  D/w pt that I would check with pharmacy here about med assistance with updated insulin dose.  A1c improved, would continue as is.

## 2023-04-30 NOTE — Assessment & Plan Note (Signed)
Could be cervical vs from the wrist.  Can use an OTC wrist brace at night. Refer for neck PT.  She should get a call about that.   Update me as needed.  She agrees with plan.

## 2023-05-08 ENCOUNTER — Telehealth: Payer: Self-pay

## 2023-05-08 DIAGNOSIS — K7581 Nonalcoholic steatohepatitis (NASH): Secondary | ICD-10-CM | POA: Diagnosis not present

## 2023-05-08 DIAGNOSIS — K7460 Unspecified cirrhosis of liver: Secondary | ICD-10-CM | POA: Diagnosis not present

## 2023-05-08 NOTE — Progress Notes (Signed)
   Care Guide Note  05/08/2023 Name: Allison Yoder MRN: [id number] DOB: 1950/11/29  Referred by: Joaquim Nam, MD Reason for referral : Care Coordination (Outreach to schedule with Pharm d )   Allison Yoder is a 72 y.o. year old female who is a primary care patient of Joaquim Nam, MD. Allison Yoder was referred to the pharmacist for assistance related to DM.    Successful contact was made with the patient to discuss pharmacy services including being ready for the pharmacist to call at least 5 minutes before the scheduled appointment time, to have medication bottles and any blood sugar or blood pressure readings ready for review. The patient agreed to meet with the pharmacist via with the pharmacist via telephone visit on (date/time).  06/06/2023  Penne Lash, RMA Care Guide [facility]  [place], [place] [address] Direct Dial: [phone] [email or web address]

## 2023-05-10 DIAGNOSIS — K080 Exfoliation of teeth due to systemic causes: Secondary | ICD-10-CM | POA: Diagnosis not present

## 2023-05-14 ENCOUNTER — Emergency Department (HOSPITAL_BASED_OUTPATIENT_CLINIC_OR_DEPARTMENT_OTHER): Payer: Medicare Other

## 2023-05-14 ENCOUNTER — Encounter (HOSPITAL_BASED_OUTPATIENT_CLINIC_OR_DEPARTMENT_OTHER): Payer: Self-pay

## 2023-05-14 ENCOUNTER — Observation Stay (HOSPITAL_BASED_OUTPATIENT_CLINIC_OR_DEPARTMENT_OTHER)
Admission: EM | Admit: 2023-05-14 | Discharge: 2023-05-16 | Disposition: A | Discharge status: Home / Self Care | Payer: Medicare Other | Attending: Student | Admitting: Student

## 2023-05-14 ENCOUNTER — Other Ambulatory Visit: Payer: Self-pay

## 2023-05-14 DIAGNOSIS — E119 Type 2 diabetes mellitus without complications: Secondary | ICD-10-CM | POA: Diagnosis not present

## 2023-05-14 DIAGNOSIS — Z7984 Long term (current) use of oral hypoglycemic drugs: Secondary | ICD-10-CM | POA: Diagnosis not present

## 2023-05-14 DIAGNOSIS — K828 Other specified diseases of gallbladder: Secondary | ICD-10-CM | POA: Diagnosis not present

## 2023-05-14 DIAGNOSIS — Z87891 Personal history of nicotine dependence: Secondary | ICD-10-CM | POA: Insufficient documentation

## 2023-05-14 DIAGNOSIS — E039 Hypothyroidism, unspecified: Secondary | ICD-10-CM | POA: Diagnosis not present

## 2023-05-14 DIAGNOSIS — R1011 Right upper quadrant pain: Principal | ICD-10-CM

## 2023-05-14 DIAGNOSIS — K810 Acute cholecystitis: Secondary | ICD-10-CM

## 2023-05-14 DIAGNOSIS — Z79899 Other long term (current) drug therapy: Secondary | ICD-10-CM | POA: Diagnosis not present

## 2023-05-14 DIAGNOSIS — K5730 Diverticulosis of large intestine without perforation or abscess without bleeding: Secondary | ICD-10-CM | POA: Diagnosis not present

## 2023-05-14 DIAGNOSIS — Z8673 Personal history of transient ischemic attack (TIA), and cerebral infarction without residual deficits: Secondary | ICD-10-CM | POA: Insufficient documentation

## 2023-05-14 DIAGNOSIS — Z91040 Latex allergy status: Secondary | ICD-10-CM | POA: Insufficient documentation

## 2023-05-14 DIAGNOSIS — Z794 Long term (current) use of insulin: Secondary | ICD-10-CM | POA: Diagnosis not present

## 2023-05-14 DIAGNOSIS — I10 Essential (primary) hypertension: Secondary | ICD-10-CM | POA: Diagnosis not present

## 2023-05-14 DIAGNOSIS — K8050 Calculus of bile duct without cholangitis or cholecystitis without obstruction: Secondary | ICD-10-CM | POA: Diagnosis present

## 2023-05-14 LAB — COMPREHENSIVE METABOLIC PANEL
ALT: 25 U/L (ref 0–44)
AST: 24 U/L (ref 15–41)
Albumin: 4.2 g/dL (ref 3.5–5.0)
Alkaline Phosphatase: 95 U/L (ref 38–126)
Anion gap: 10 (ref 5–15)
BUN: 17 mg/dL (ref 8–23)
CO2: 24 mmol/L (ref 22–32)
Calcium: 9.2 mg/dL (ref 8.9–10.3)
Chloride: 105 mmol/L (ref 98–111)
Creatinine, Ser: 0.76 mg/dL (ref 0.44–1.00)
GFR, Estimated: >60 mL/min (ref >60)
Glucose, Bld: 192 mg/dL — ABNORMAL HIGH (ref 70–99)
Potassium: 3.8 mmol/L (ref 3.5–5.1)
Sodium: 139 mmol/L (ref 135–145)
Total Bilirubin: 0.5 mg/dL (ref 0.3–1.2)
Total Protein: 6.9 g/dL (ref 6.5–8.1)

## 2023-05-14 LAB — CBC
HCT: 40.9 % (ref 36.0–46.0)
Hemoglobin: 13.8 g/dL (ref 12.0–15.0)
MCH: 30.3 pg (ref 26.0–34.0)
MCHC: 33.7 g/dL (ref 30.0–36.0)
MCV: 89.9 fL (ref 80.0–100.0)
Platelets: 170 10*3/uL (ref 150–400)
RBC: 4.55 MIL/uL (ref 3.87–5.11)
RDW: 13.7 % (ref 11.5–15.5)
WBC: 9.7 10*3/uL (ref 4.0–10.5)
nRBC: 0 % (ref 0.0–0.2)

## 2023-05-14 LAB — LIPASE, BLOOD: Lipase: 45 U/L (ref 11–51)

## 2023-05-14 MED ORDER — ONDANSETRON HCL 4 MG/2ML IJ SOLN
4.0000 mg | Freq: Once | INTRAMUSCULAR | Status: AC
  Administered 2023-05-14: 4 mg via INTRAVENOUS
  Filled 2023-05-14: qty 2

## 2023-05-14 MED ORDER — SODIUM CHLORIDE 0.9 % IV BOLUS
1000.0000 mL | Freq: Once | INTRAVENOUS | Status: AC
  Administered 2023-05-14: 1000 mL via INTRAVENOUS

## 2023-05-14 MED ORDER — FENTANYL CITRATE PF 50 MCG/ML IJ SOSY
50.0000 ug | PREFILLED_SYRINGE | Freq: Once | INTRAMUSCULAR | Status: AC
  Administered 2023-05-14: 50 ug via INTRAVENOUS
  Filled 2023-05-14: qty 1

## 2023-05-14 MED ORDER — IOHEXOL 300 MG/ML  SOLN
100.0000 mL | Freq: Once | INTRAMUSCULAR | Status: AC | PRN
  Administered 2023-05-14: 100 mL via INTRAVENOUS

## 2023-05-14 NOTE — ED Notes (Addendum)
Returned from CT and pain improved. Pt unable to provide urine sample at this time. States that she forgot that sample was needed and used restroom while at CT.

## 2023-05-14 NOTE — ED Triage Notes (Signed)
Pt POV from home reporting bloating and RUQ pain that started after eating dinner. +n/-v.

## 2023-05-15 ENCOUNTER — Emergency Department (HOSPITAL_BASED_OUTPATIENT_CLINIC_OR_DEPARTMENT_OTHER): Payer: Medicare Other

## 2023-05-15 ENCOUNTER — Encounter (HOSPITAL_COMMUNITY): Payer: Self-pay | Admitting: Internal Medicine

## 2023-05-15 DIAGNOSIS — K838 Other specified diseases of biliary tract: Secondary | ICD-10-CM | POA: Diagnosis not present

## 2023-05-15 DIAGNOSIS — R1011 Right upper quadrant pain: Secondary | ICD-10-CM | POA: Diagnosis present

## 2023-05-15 DIAGNOSIS — K8050 Calculus of bile duct without cholangitis or cholecystitis without obstruction: Secondary | ICD-10-CM | POA: Diagnosis present

## 2023-05-15 DIAGNOSIS — K828 Other specified diseases of gallbladder: Secondary | ICD-10-CM | POA: Diagnosis not present

## 2023-05-15 DIAGNOSIS — K810 Acute cholecystitis: Secondary | ICD-10-CM | POA: Diagnosis not present

## 2023-05-15 DIAGNOSIS — R932 Abnormal findings on diagnostic imaging of liver and biliary tract: Secondary | ICD-10-CM | POA: Diagnosis not present

## 2023-05-15 DIAGNOSIS — K7689 Other specified diseases of liver: Secondary | ICD-10-CM | POA: Diagnosis not present

## 2023-05-15 LAB — GLUCOSE, CAPILLARY
Glucose-Capillary: 133 mg/dL — ABNORMAL HIGH (ref 70–99)
Glucose-Capillary: 154 mg/dL — ABNORMAL HIGH (ref 70–99)
Glucose-Capillary: 156 mg/dL — ABNORMAL HIGH (ref 70–99)
Glucose-Capillary: 169 mg/dL — ABNORMAL HIGH (ref 70–99)
Glucose-Capillary: 177 mg/dL — ABNORMAL HIGH (ref 70–99)

## 2023-05-15 LAB — URINALYSIS, ROUTINE W REFLEX MICROSCOPIC
Bilirubin Urine: NEGATIVE
Glucose, UA: NEGATIVE mg/dL
Hgb urine dipstick: NEGATIVE
Ketones, ur: NEGATIVE mg/dL
Leukocytes,Ua: NEGATIVE
Nitrite: NEGATIVE
Protein, ur: NEGATIVE mg/dL
Specific Gravity, Urine: >1.046 — ABNORMAL HIGH (ref 1.005–1.030)
pH: 6.5 (ref 5.0–8.0)

## 2023-05-15 MED ORDER — CIPROFLOXACIN IN D5W 400 MG/200ML IV SOLN
400.0000 mg | Freq: Once | INTRAVENOUS | Status: AC
  Administered 2023-05-15: 400 mg via INTRAVENOUS
  Filled 2023-05-15: qty 200

## 2023-05-15 MED ORDER — ONDANSETRON HCL 4 MG/2ML IJ SOLN: 4.0000 mg | Freq: Four times a day (QID) | INTRAMUSCULAR | Status: DC | PRN

## 2023-05-15 MED ORDER — CITALOPRAM HYDROBROMIDE 20 MG PO TABS
20.0000 mg | ORAL_TABLET | Freq: Every day | ORAL | Status: DC
  Administered 2023-05-15 – 2023-05-16 (×2): 20 mg via ORAL
  Filled 2023-05-15 (×2): qty 1

## 2023-05-15 MED ORDER — CLONAZEPAM 0.5 MG PO TABS: 0.5000 mg | ORAL_TABLET | Freq: Every day | ORAL | Status: DC | PRN

## 2023-05-15 MED ORDER — ENOXAPARIN SODIUM 40 MG/0.4ML IJ SOSY
40.0000 mg | PREFILLED_SYRINGE | INTRAMUSCULAR | Status: DC
  Administered 2023-05-15: 40 mg via SUBCUTANEOUS
  Filled 2023-05-15: qty 0.4

## 2023-05-15 MED ORDER — METOPROLOL SUCCINATE ER 50 MG PO TB24
50.0000 mg | ORAL_TABLET | Freq: Every day | ORAL | Status: DC
  Administered 2023-05-15 – 2023-05-16 (×2): 50 mg via ORAL
  Filled 2023-05-15 (×2): qty 1

## 2023-05-15 MED ORDER — INSULIN GLARGINE-YFGN 100 UNIT/ML ~~LOC~~ SOLN
20.0000 [IU] | Freq: Every day | SUBCUTANEOUS | Status: DC
  Administered 2023-05-15 – 2023-05-16 (×2): 20 [IU] via SUBCUTANEOUS
  Filled 2023-05-15 (×2): qty 0.2

## 2023-05-15 MED ORDER — HYDROMORPHONE HCL 1 MG/ML IJ SOLN: 0.5000 mg | INTRAMUSCULAR | Status: DC | PRN

## 2023-05-15 MED ORDER — LISINOPRIL 20 MG PO TABS
40.0000 mg | ORAL_TABLET | Freq: Every day | ORAL | Status: DC
  Administered 2023-05-15 – 2023-05-16 (×2): 40 mg via ORAL
  Filled 2023-05-15 (×2): qty 2

## 2023-05-15 MED ORDER — SALINE SPRAY 0.65 % NA SOLN: 1.0000 | NASAL | Status: DC | PRN

## 2023-05-15 MED ORDER — LORAZEPAM 1 MG PO TABS: 1.0000 mg | ORAL_TABLET | Freq: Once | ORAL | Status: DC | PRN

## 2023-05-15 MED ORDER — INSULIN ASPART 100 UNIT/ML IJ SOLN
0.0000 [IU] | INTRAMUSCULAR | Status: DC
  Administered 2023-05-15 – 2023-05-16 (×5): 3 [IU] via SUBCUTANEOUS

## 2023-05-15 MED ORDER — ACETAMINOPHEN 650 MG RE SUPP: 650.0000 mg | Freq: Four times a day (QID) | RECTAL | Status: DC | PRN

## 2023-05-15 MED ORDER — ALBUTEROL SULFATE (2.5 MG/3ML) 0.083% IN NEBU: 2.5000 mg | INHALATION_SOLUTION | RESPIRATORY_TRACT | Status: DC | PRN

## 2023-05-15 MED ORDER — SODIUM CHLORIDE 0.9 % IV SOLN: INTRAVENOUS | Status: DC

## 2023-05-15 MED ORDER — ALUM & MAG HYDROXIDE-SIMETH 200-200-20 MG/5ML PO SUSP
30.0000 mL | Freq: Once | ORAL | Status: AC
  Administered 2023-05-15: 30 mL via ORAL
  Filled 2023-05-15: qty 30

## 2023-05-15 MED ORDER — ONDANSETRON HCL 4 MG/2ML IJ SOLN
4.0000 mg | Freq: Once | INTRAMUSCULAR | Status: AC
  Administered 2023-05-15: 4 mg via INTRAVENOUS
  Filled 2023-05-15: qty 2

## 2023-05-15 MED ORDER — HYDROMORPHONE HCL 1 MG/ML IJ SOLN
0.5000 mg | Freq: Once | INTRAMUSCULAR | Status: AC
  Administered 2023-05-15: 0.5 mg via INTRAVENOUS
  Filled 2023-05-15: qty 1

## 2023-05-15 MED ORDER — PANTOPRAZOLE SODIUM 40 MG PO TBEC
40.0000 mg | DELAYED_RELEASE_TABLET | Freq: Every day | ORAL | Status: DC
  Administered 2023-05-15: 40 mg via ORAL
  Filled 2023-05-15 (×2): qty 1

## 2023-05-15 MED ORDER — CYCLOBENZAPRINE HCL 10 MG PO TABS: 10.0000 mg | ORAL_TABLET | Freq: Every day | ORAL | Status: DC | PRN

## 2023-05-15 MED ORDER — OXYCODONE HCL 5 MG PO TABS
5.0000 mg | ORAL_TABLET | ORAL | Status: DC | PRN
  Administered 2023-05-15: 5 mg via ORAL
  Filled 2023-05-15: qty 1

## 2023-05-15 MED ORDER — ONDANSETRON HCL 4 MG PO TABS: 4.0000 mg | ORAL_TABLET | Freq: Four times a day (QID) | ORAL | Status: DC | PRN

## 2023-05-15 MED ORDER — FLUTICASONE PROPIONATE 50 MCG/ACT NA SUSP: 1.0000 | Freq: Every day | NASAL | Status: DC | PRN

## 2023-05-15 MED ORDER — ACETAMINOPHEN 325 MG PO TABS
650.0000 mg | ORAL_TABLET | Freq: Four times a day (QID) | ORAL | Status: DC | PRN
  Administered 2023-05-15: 650 mg via ORAL
  Filled 2023-05-15: qty 2

## 2023-05-15 NOTE — Consult Note (Addendum)
[facility] Gastroenterology Consult  Referring Provider: No ref. provider found Primary Care Physician:  Joaquim Nam, MD Primary Gastroenterologist: [facility] hepatology, previously followed with [facility] (Dr. Bari Mantis seen in office on 08/20/2021 for follow-up of liver biopsy which showed moderately active steatohepatitis with cirrhosis, patient was recommended to follow-up with [facility] tertiary care center for further management.  Reason for Consultation: Possible choledocholithiasis  SUBJECTIVE:   HPI: Allison Yoder is a 72 y.o. female with past medical history significant for steatohepatitis with cirrhosis, gastroesophageal reflux disease who presented to hospital on 05/14/2023 with epigastric and right upper quadrant pain.  She noted that pain began last evening while she was eating dinner, pain was located in the right upper quadrant with radiation to her epigastric region and her mid thoracic spine.  She had nausea with no vomiting.  She had difficulty taking a deep breath and with the pain.  No current chest pain or shortness of breath.  She has chronically altered bowel habits, intermittent diarrhea, intermittent blood per rectum which she attributes to hemorrhoids.  She noted having similar pain over the past 1 year though not as severe as yesterday.  Labs on presentation showed AST/ALT 20/25, alkaline phosphatase 95, total bilirubin 0.5, lipase 45, WBC 9.7, hemoglobin 13.8, platelet 170.  CT scan of abdomen pelvis completed on 05/14/2023 showed mildly distended gallbladder, no visible cholelithiasis, no biliary dilatation, pancreas within normal limits, and left-sided colonic diverticulosis.  Follow-up abdominal ultrasound on 05/15/2023 showed more distended gallbladder, trace pericholecystic fluid, small layering sludge, common bile duct 7 mm.  EGD and colonoscopy completed 04/17/2015 for left upper quadrant pain, chronic diarrhea and hematochezia (Dr. Ewing Schlein) showed esophagus within normal limits,  antrum inflammation, duodenum within normal limits, sigmoid and descending colon and transverse colon diverticulosis, terminal ileum within normal limits, internal and external hemorrhoids.  Pathology showed benign gastric mucosa, H. pylori negative, benign colonic mucosa for evaluation of microscopic colitis.  Past Medical History:  Diagnosis Date   Allergy    Anemia    "all the time as a child"   Anxiety    Arthritis    "all my joints; worse in my hands" (10/27/2015)   Chronic lower back pain    Depression    Diverticulosis    Fatty liver    GERD (gastroesophageal reflux disease)    Grade I diastolic dysfunction    Heart murmur    "dx'd years ago; very mild; never treated" (10/27/2015)   Hyperlipidemia    Hypertension    Hypothyroidism 2002-~ 2010   Iritis    per [facility]   Migraines    "stopped when I went thru menopause"   NASH (nonalcoholic steatohepatitis)    OSA on CPAP    Pneumonia ~ 2013; 10/27/2015   Recurrent urinary tract infection    Routine general medical examination at a health care facility 02/26/2014   Snores    Stroke (HCC) 2017   TIAs x2.  No deficits   Type II diabetes mellitus (HCC)    "lost weight; started exercising again; don't have it anymore" (10/27/2015)   Urinary incontinence    Wears glasses    Wears hearing aid in both ears    Past Surgical History:  Procedure Laterality Date   BLEPHAROPLASTY Bilateral 2013; 2016   BREAST LUMPECTOMY Right 1964   benign tumor,   CATARACT EXTRACTION W/PHACO Right 01/31/2023   Procedure: CATARACT EXTRACTION PHACO AND INTRAOCULAR LENS PLACEMENT (IOC) RIGHT DIABETIC  5.04  00:36.2;  Surgeon: Galen Manila,  MD;  Location: [facility];  Service: Ophthalmology;  Laterality: Right;  Diabetic   CATARACT EXTRACTION W/PHACO Left 02/14/2023   Procedure: CATARACT EXTRACTION PHACO AND INTRAOCULAR LENS PLACEMENT (IOC) LEFT DIABETIC  5.73  00:40.1;  Surgeon: Galen Manila, MD;  Location[facility];  Service: Ophthalmology;  Laterality: Left;  Diabetic   HEMORRHOID BANDING  X 2   KNEE ARTHROSCOPY Left 2016   "meniscus repair"   PELVIC FLOOR REPAIR  2001   "lift"   POLYPECTOMY  X 3   "bladder"   SHOULDER ARTHROSCOPY W/ ROTATOR CUFF REPAIR Left 2013   SHOULDER ARTHROSCOPY W/ ROTATOR CUFF REPAIR Right 2015   TUBAL LIGATION  ~ 1986   VAGINAL HYSTERECTOMY  1992   Partial   Prior to Admission medications   Medication Sig Start Date End Date Taking? Authorizing Provider  albuterol (VENTOLIN HFA) 108 (90 Base) MCG/ACT inhaler INHALE 1 TO 2 PUFFS INTO THE LUNGS EVERY 6 HOURS AS NEEDED FOR WHEEZING OR SHORTNESS OF BREATH 01/19/22   Mort Sawyers, FNP  Blood Glucose Monitoring Suppl (ONE TOUCH ULTRA 2) w/Device KIT Check blood sugar once daily and as instructed. Dx E11.9 12/29/16   Joaquim Nam, MD  citalopram (CELEXA) 20 MG tablet TAKE 1 TABLET(20 MG) BY MOUTH DAILY 11/07/22   Joaquim Nam, MD  clonazePAM (KLONOPIN) 0.5 MG tablet Take 1 tablet (0.5 mg total) by mouth daily as needed for anxiety. 11/16/21   Joaquim Nam, MD  cyanocobalamin 1000 MCG tablet Take 1,000 mcg by mouth daily.    [provider]  cyclobenzaprine (FLEXERIL) 10 MG tablet Take 1 tablet (10 mg total) by mouth daily as needed for muscle spasms. 09/03/19   Joaquim Nam, MD  Evolocumab (REPATHA SURECLICK) 140 MG/ML SOAJ Inject 140 mg into the skin every 14 (fourteen) days. 08/01/22   Wendall Stade, MD  fluticasone (FLONASE) 50 MCG/ACT nasal spray Place 1 spray into both nostrils daily as needed for allergies or rhinitis.    [provider]  glucose blood (ONETOUCH ULTRA) test strip as directed to check blood sugar daily. Dx E11.9 11/10/22   Joaquim Nam, MD  insulin glargine (LANTUS SOLOSTAR) 100 UNIT/ML Solostar Pen Inject 50 Units into the skin at bedtime. 04/28/23   Joaquim Nam, MD  Insulin Pen Needle (PEN NEEDLES) 32G X 5 MM MISC Use with insulin pen 01/05/23   Joaquim Nam,  MD  JANUVIA 50 MG tablet TAKE 1 TABLET(50 MG) BY MOUTH DAILY 10/28/22   Joaquim Nam, MD  Lancets [facility] DELICA PLUS [place]) MISC USE ONCE DAILY AS DIRECTED 11/15/22   Joaquim Nam, MD  lisinopril (ZESTRIL) 40 MG tablet TAKE 1 TABLET(40 MG) BY MOUTH DAILY 10/28/22   Joaquim Nam, MD  metFORMIN (GLUCOPHAGE) 500 MG tablet Take 1 tablet (500 mg total) by mouth 2 (two) times daily with a meal. 11/17/22   Joaquim Nam, MD  metoprolol succinate (TOPROL-XL) 50 MG 24 hr tablet TAKE 1 TABLET BY MOUTH DAILY WITH OR IMMEDIATELY FOLLOWING A MEAL 10/28/22   Joaquim Nam, MD  Multiple Vitamin (MULTIVITAMIN) tablet Take 1 tablet by mouth daily.    [provider]  omeprazole (PRILOSEC) 20 MG capsule TAKE 1 CAPSULE(20 MG) BY MOUTH DAILY 02/10/23   Joaquim Nam, MD  Vitamin D, Cholecalciferol, 1000 units CAPS Take 3,000 Units by mouth daily.    [provider]   Current Facility-Administered Medications  Medication Dose Route Frequency Provider Last  Rate Last Admin   0.9 %  sodium chloride infusion   Intravenous Continuous Kirby Crigler, Mir M, MD       acetaminophen (TYLENOL) tablet 650 mg  650 mg Oral Q6H PRN Kirby Crigler, Mir M, MD       Or   acetaminophen (TYLENOL) suppository 650 mg  650 mg Rectal Q6H PRN Kirby Crigler, Mir M, MD       albuterol (PROVENTIL) (2.5 MG/3ML) 0.083% nebulizer solution 2.5 mg  2.5 mg Nebulization Q2H PRN Kirby Crigler, Mir M, MD       clonazePAM [place] [place]) tablet 0.5 mg  0.5 mg Oral Daily PRN Kirby Crigler, Mir M, MD       cyclobenzaprine (FLEXERIL) tablet 10 mg  10 mg Oral Daily PRN Kirby Crigler, Mir M, MD       enoxaparin (LOVENOX) injection 40 mg  40 mg Subcutaneous Q24H Kirby Crigler, Mir M, MD       fluticasone (FLONASE) 50 MCG/ACT nasal spray 1 spray  1 spray Each Nare Daily PRN Kirby Crigler, Mir M, MD       HYDROmorphone (DILAUDID) injection 0.5-1 mg  0.5-1 mg Intravenous Q2H PRN Kirby Crigler, Mir M, MD       insulin aspart (novoLOG) injection 0-15 Units   0-15 Units Subcutaneous Q4H Kirby Crigler, Mir M, MD       insulin glargine-yfgn [facility]) injection 20 Units  20 Units Subcutaneous Daily Kirby Crigler, Mir M, MD       lisinopril (ZESTRIL) tablet 40 mg  40 mg Oral Daily Kirby Crigler, Mir M, MD       LORazepam (ATIVAN) tablet 1 mg  1 mg Oral Once PRN Kirby Crigler, Mir M, MD       metoprolol succinate (TOPROL-XL) 24 hr tablet 50 mg  50 mg Oral Daily Kirby Crigler, Mir M, MD       ondansetron [facility]) tablet 4 mg  4 mg Oral Q6H PRN Kirby Crigler, Mir M, MD       Or   ondansetron [facility]) injection 4 mg  4 mg Intravenous Q6H PRN Kirby Crigler, Mir M, MD       oxyCODONE (Oxy IR/ROXICODONE) immediate release tablet 5 mg  5 mg Oral Q4H PRN Kirby Crigler, Mir M, MD       pantoprazole (PROTONIX) EC tablet 40 mg  40 mg Oral Daily Kirby Crigler, Mir M, MD       Allergies as of 05/14/2023 - Review Complete 05/14/2023  Allergen Reaction Noted   Cephalexin Itching 12/16/2011   Codeine Itching and Rash 12/16/2011   Inapsine [droperidol] Shortness Of Breath, Anxiety, and Hypertension 03/13/2012   Crestor [rosuvastatin]  09/03/2019   Lipitor [atorvastatin calcium]  01/06/2012   Metformin and related  11/30/2022   Niacin and related  01/06/2012   Pravastatin  01/06/2012   Adhesive [tape] Rash 03/13/2012   Latex Rash 12/17/2020   Family History  Problem Relation Age of Onset   Arthritis Mother    Heart disease Mother    Hyperlipidemia Mother    Hypertension Mother    Diabetes Mother    Depression Mother    Stroke Mother    Alcohol abuse Father    Lung cancer Father    Arthritis Maternal Grandmother    Stroke Maternal Grandmother    Diabetes Maternal Grandmother    Heart disease Paternal Uncle        x 2   Arthritis Sister    Breast cancer Cousin    Heart disease Paternal Aunt    Diabetes Paternal Uncle    Diabetes Sister  Heart disease Paternal Uncle        x 5   Heart disease Paternal Aunt        x 3   Stroke Paternal Aunt    Dementia Paternal Grandmother     Colon cancer Neg Hx    Social History   Socioeconomic History   Marital status: Single    Spouse name: Not on file   Number of children: 1   Years of education: Not on file   Highest education level: Not on file  Occupational History   Occupation: Dental Receptionist/Assistant    Comment: Dr. Reuel Boom in [place]  Tobacco Use   Smoking status: Former    Current packs/day: 0.00    Average packs/day: 2.0 packs/day for 5.0 years (10.0 ttl pk-yrs)    Types: Cigarettes    Start date: 09/12/1980    Quit date: 09/12/1985    Years since quitting: 37.6   Smokeless tobacco: Never  Vaping Use   Vaping status: Never Used  Substance and Sexual Activity   Alcohol use: Not Currently   Drug use: No   Sexual activity: Yes  Other Topics Concern   Not on file  Social History Narrative   Education:  Medical sales representative   Divorced and lives with her brother.     Caring for her grandson Jean Rosenthal) as of 2023   Social Determinants of Health   Financial Resource Strain: Medium Risk (09/09/2022)   Overall Financial Resource Strain (CARDIA)    Difficulty of Paying Living Expenses: Somewhat hard  Food Insecurity: No Food Insecurity (01/09/2023)   Hunger Vital Sign    Worried About Running Out of Food in the Last Year: Never true    Ran Out of Food in the Last Year: Never true  Transportation Needs: No Transportation Needs (01/09/2023)   PRAPARE - Administrator, Civil Service (Medical): No    Lack of Transportation (Non-Medical): No  Physical Activity: Inactive (09/09/2022)   Exercise Vital Sign    Days of Exercise per Week: 0 days    Minutes of Exercise per Session: 0 min  Stress: Stress Concern Present (09/09/2022)   [employer] of Occupational Health - Occupational Stress Questionnaire    Feeling of Stress : To some extent  Social Connections: Unknown (09/09/2022)   Social Connection and Isolation Panel [NHANES]    Frequency of Communication with Friends and  Family: More than three times a week    Frequency of Social Gatherings with Friends and Family: Once a week    Attends Religious Services: Not on Marketing executive or Organizations: No    Attends Banker Meetings: Never    Marital Status: Divorced  Catering manager Violence: Not At Risk (09/09/2022)   Humiliation, Afraid, Rape, and Kick questionnaire    Fear of Current or Ex-Partner: No    Emotionally Abused: No    Physically Abused: No    Sexually Abused: No   Review of Systems:  Review of Systems  Constitutional:  Negative for chills and fever.  Respiratory:  Negative for shortness of breath.   Cardiovascular:  Negative for chest pain.  Gastrointestinal:  Positive for abdominal pain and nausea. Negative for vomiting.    OBJECTIVE:   Temp:  [98.3 F (36.8 C)-98.6 F (37 C)] 98.3 F (36.8 C) (09/02 0858) Pulse Rate:  [65-81] 68 (09/02 0858) Resp:  [13-23] 16 (09/02 0858) BP: (127-179)/(60-91) 148/75 (09/02 0858) SpO2:  [93 %-98 %]  98 % (09/02 0858) Weight:  [81.6 kg] 81.6 kg (09/01 2115)   Physical Exam Constitutional:      General: She is not in acute distress.    Appearance: She is not ill-appearing, toxic-appearing or diaphoretic.  Cardiovascular:     Rate and Rhythm: Normal rate and regular rhythm.  Pulmonary:     Effort: No respiratory distress.     Breath sounds: Normal breath sounds.  Abdominal:     General: Bowel sounds are normal. There is no distension.     Palpations: Abdomen is soft.     Tenderness: There is abdominal tenderness (RUQ). There is no guarding.  Musculoskeletal:     Right lower leg: No edema.     Left lower leg: No edema.  Skin:    General: Skin is warm and dry.     Coloration: Skin is not jaundiced.  Neurological:     Mental Status: She is alert.     Labs: Recent Labs    05/14/23 2124  WBC 9.7  HGB 13.8  HCT 40.9  PLT 170   BMET Recent Labs    05/14/23 2124  NA 139  K 3.8  CL 105  CO2 24   GLUCOSE 192*  BUN 17  CREATININE 0.76  CALCIUM 9.2   LFT Recent Labs    05/14/23 2124  PROT 6.9  ALBUMIN 4.2  AST 24  ALT 25  ALKPHOS 95  BILITOT 0.5   PT/INR No results for input(s): "LABPROT", "INR" in the last 72 hours.  Diagnostic imaging: US Abdomen Limited RUQ (LIVER/GB)  Result Date: 05/15/2023 CLINICAL DATA:  72 year old female with right upper quadrant pain since 1900 hours. EXAM: ULTRASOUND ABDOMEN LIMITED RIGHT UPPER QUADRANT COMPARISON:  CT Abdomen and Pelvis yesterday. Previous abdomen ultrasound 01/04/2021. FINDINGS: Gallbladder: The gallbladder appears more distended, and there is trace pericholecystic fluid (image 6) as suggested by CT. Small volume of layering sludge (image 10), but no shadowing echogenic gallstone identified. And overall gallbladder wall thickness remains normal. Common bile duct: Diameter: 7 mm, dilated (4 mm in 2022). No filling defect visible in the duct. Liver: No convincing intrahepatic biliary ductal dilatation. Mildly nodular liver contour (image 35). Background echogenicity within normal limits. No discrete liver lesion. Portal vein is patent on color Doppler imaging with normal direction of blood flow towards the liver. Other: Negative visible right kidney. IMPRESSION: 1. Constellation of distended gallbladder with pericholecystic fluid and dilated CBD suspicious for acute choledocholithiasis. Small amount of sludge within the gallbladder. No stone identified within the gallbladder or duct. 2. Mildly nodular liver contour raising the possibility of cirrhosis. No discrete liver lesion. Electronically Signed   By: Odessa Fleming M.D.   On: 05/15/2023 05:25   CT ABDOMEN PELVIS W CONTRAST  Result Date: 05/14/2023 CLINICAL DATA:  Abdominal pain, acute, ruq EXAM: CT ABDOMEN AND PELVIS WITH CONTRAST TECHNIQUE: Multidetector CT imaging of the abdomen and pelvis was performed using the standard protocol following bolus administration of intravenous contrast.  RADIATION DOSE REDUCTION: This exam was performed according to the departmental dose-optimization program which includes automated exposure control, adjustment of the mA and/or kV according to patient size and/or use of iterative reconstruction technique. CONTRAST:  OMNIPAQUE IOHEXOL 300 MG/ML  SOLN COMPARISON:  04/01/2021 FINDINGS: Lower chest: No acute abnormality Hepatobiliary: Gallbladder is mildly distended. No visible stones. No biliary ductal dilatation. Mild diffuse low-density throughout the liver compatible with fatty infiltration. Pancreas: No focal abnormality or ductal dilatation. Spleen: No focal abnormality.  Normal size.  Adrenals/Urinary Tract: No adrenal abnormality. No focal renal abnormality. No stones or hydronephrosis. Urinary bladder is unremarkable. Stomach/Bowel: Left colonic diverticulosis. No active diverticulitis. Stomach and small bowel unremarkable. No bowel obstruction. Vascular/Lymphatic: No evidence of aneurysm or adenopathy. Aortic atherosclerosis. Reproductive: Prior hysterectomy.  No adnexal masses. Other: No free fluid or free air. Musculoskeletal: No acute bony abnormality. IMPRESSION: No acute findings. Left colonic diverticulosis. Aortic atherosclerosis. Electronically Signed   By: Charlett Nose M.D.   On: 05/14/2023 23:27    IMPRESSION: Right upper quadrant pain Distended gallbladder on CT and ultrasound imaging Minimally enlarged common bile duct History steatohepatitis with cirrhosis based on liver biopsy 08/02/2021  -On metformin, follows with [facility] hepatology History GERD History diverticulosis  PLAN: -Patient appears to be resting comfortably, nontoxic in appearance, will determine if MRCP can be completed today given holiday -If MRCP not completed today, will allow for diet and make n.p.o. after midnight for MRCP tomorrow -Recommend general surgery consultation given gallbladder distention, possible cholecystitis -Trend liver enzyme panel -Recommend  outpatient follow-up with hepatology -[facility] will follow, Dr. Bosie Clos tomorrow   LOS: 0 days   Liliane Shi, DO [facility]  Update 10:50 AM -Per discussion with nursing staff, MRCP will not be completed until tomorrow -Will allow diet today, NPO at midnight for MRCP 05/16/23 -[facility] will follow  Liliane Shi, DO [facility]

## 2023-05-15 NOTE — ED Provider Notes (Signed)
[place] EMERGENCY DEPARTMENT AT [facility] Provider Note   CSN: [id number] Arrival date & time: 05/14/23  2105     History  Chief Complaint  Patient presents with   Abdominal Pain    Allison Yoder is a 72 y.o. female.  Patient here with upper abdominal pain.  Mostly epigastric and right upper quadrant.  History of acid reflux, hypertension, high cholesterol, fatty liver.  She states that pain started after eating.  She has had some nausea but no vomiting.  No diarrhea.  Feels like she has a lot of gas.  She denies any chest pain or shortness of breath.  Nothing makes it worse or better.  No history of abdominal surgery.  Still has her gallbladder.  The history is provided by the patient.       Home Medications Prior to Admission medications   Medication Sig Start Date End Date Taking? Authorizing Provider  albuterol (VENTOLIN HFA) 108 (90 Base) MCG/ACT inhaler INHALE 1 TO 2 PUFFS INTO THE LUNGS EVERY 6 HOURS AS NEEDED FOR WHEEZING OR SHORTNESS OF BREATH 01/19/22   Mort Sawyers, FNP  Blood Glucose Monitoring Suppl (ONE TOUCH ULTRA 2) w/Device KIT Check blood sugar once daily and as instructed. Dx E11.9 12/29/16   Joaquim Nam, MD  citalopram (CELEXA) 20 MG tablet TAKE 1 TABLET(20 MG) BY MOUTH DAILY 11/07/22   Joaquim Nam, MD  clonazePAM (KLONOPIN) 0.5 MG tablet Take 1 tablet (0.5 mg total) by mouth daily as needed for anxiety. 11/16/21   Joaquim Nam, MD  cyanocobalamin 1000 MCG tablet Take 1,000 mcg by mouth daily.    [provider]  cyclobenzaprine (FLEXERIL) 10 MG tablet Take 1 tablet (10 mg total) by mouth daily as needed for muscle spasms. 09/03/19   Joaquim Nam, MD  Evolocumab (REPATHA SURECLICK) 140 MG/ML SOAJ Inject 140 mg into the skin every 14 (fourteen) days. 08/01/22   Wendall Stade, MD  fluticasone (FLONASE) 50 MCG/ACT nasal spray Place 1 spray into both nostrils daily as needed for allergies or rhinitis.    [provider]  glucose blood (ONETOUCH ULTRA) test strip as directed to check blood sugar daily. Dx E11.9 11/10/22   Joaquim Nam, MD  insulin glargine (LANTUS SOLOSTAR) 100 UNIT/ML Solostar Pen Inject 50 Units into the skin at bedtime. 04/28/23   Joaquim Nam, MD  Insulin Pen Needle (PEN NEEDLES) 32G X 5 MM MISC Use with insulin pen 01/05/23   Joaquim Nam, MD  JANUVIA 50 MG tablet TAKE 1 TABLET(50 MG) BY MOUTH DAILY 10/28/22   Joaquim Nam, MD  Lancets [facility] DELICA PLUS [place]) MISC USE ONCE DAILY AS DIRECTED 11/15/22   Joaquim Nam, MD  lisinopril (ZESTRIL) 40 MG tablet TAKE 1 TABLET(40 MG) BY MOUTH DAILY 10/28/22   Joaquim Nam, MD  metFORMIN (GLUCOPHAGE) 500 MG tablet Take 1 tablet (500 mg total) by mouth 2 (two) times daily with a meal. 11/17/22   Joaquim Nam, MD  metoprolol succinate (TOPROL-XL) 50 MG 24 hr tablet TAKE 1 TABLET BY MOUTH DAILY WITH OR IMMEDIATELY FOLLOWING A MEAL 10/28/22   Joaquim Nam, MD  Multiple Vitamin (MULTIVITAMIN) tablet Take 1 tablet by mouth daily.    [provider]  omeprazole (PRILOSEC) 20 MG capsule TAKE 1 CAPSULE(20 MG) BY MOUTH DAILY 02/10/23   Joaquim Nam, MD  Vitamin D, Cholecalciferol, 1000 units CAPS Take 3,000 Units by mouth daily.    [provider]      Allergies    Cephalexin, Codeine, Inapsine [droperidol], Crestor [rosuvastatin], Lipitor [atorvastatin calcium], Metformin and related, Niacin and related, Pravastatin, Adhesive [tape], and Latex    Review of Systems   Review of Systems  Physical Exam Updated Vital Signs BP (!) 167/89   Pulse 81   Temp 98.6 F (37 C) (Oral)   Resp (!) 23   Ht 5\' 6"  (1.676 m)   Wt 81.6 kg   SpO2 97%   BMI 29.05 kg/m  Physical Exam Vitals and nursing note reviewed.  Constitutional:      General: She is not in acute distress.    Appearance: She is well-developed.  HENT:     Head: Normocephalic and atraumatic.     Mouth/Throat:     Mouth: Mucous membranes are  moist.  Eyes:     Extraocular Movements: Extraocular movements intact.     Conjunctiva/sclera: Conjunctivae normal.     Pupils: Pupils are equal, round, and reactive to light.  Cardiovascular:     Rate and Rhythm: Normal rate and regular rhythm.     Heart sounds: Normal heart sounds. No murmur heard. Pulmonary:     Effort: Pulmonary effort is normal. No respiratory distress.     Breath sounds: Normal breath sounds.  Abdominal:     Palpations: Abdomen is soft.     Tenderness: There is abdominal tenderness in the right upper quadrant and epigastric area.  Musculoskeletal:        General: No swelling.     Cervical back: Neck supple.  Skin:    General: Skin is warm and dry.     Capillary Refill: Capillary refill takes less than 2 seconds.  Neurological:     General: No focal deficit present.     Mental Status: She is alert.  Psychiatric:        Mood and Affect: Mood normal.     ED Results / Procedures / Treatments   Labs (all labs ordered are listed, but only abnormal results are displayed) Labs Reviewed  COMPREHENSIVE METABOLIC PANEL - Abnormal; Notable for the following components:      Result Value   Glucose, Bld 192 (*)    All other components within normal limits  LIPASE, BLOOD  CBC  URINALYSIS, ROUTINE W REFLEX MICROSCOPIC    EKG EKG Interpretation Date/Time:  Sunday May 14 2023 21:19:39 EDT Ventricular Rate:  76 PR Interval:  182 QRS Duration:  87 QT Interval:  376 QTC Calculation: 423 R Axis:   40  Text Interpretation: Sinus rhythm Consider left atrial enlargement Confirmed by Virgina Norfolk [phone]) on 05/14/2023 11:29:32 PM  Radiology CT ABDOMEN PELVIS W CONTRAST  Result Date: 05/14/2023 CLINICAL DATA:  Abdominal pain, acute, ruq EXAM: CT ABDOMEN AND PELVIS WITH CONTRAST TECHNIQUE: Multidetector CT imaging of the abdomen and pelvis was performed using the standard protocol following bolus administration of intravenous contrast. RADIATION DOSE REDUCTION:  This exam was performed according to the departmental dose-optimization program which includes automated exposure control, adjustment of the mA and/or kV according to patient size and/or use of iterative reconstruction technique. CONTRAST:  OMNIPAQUE IOHEXOL 300 MG/ML  SOLN COMPARISON:  04/01/2021 FINDINGS: Lower chest: No acute abnormality Hepatobiliary: Gallbladder is mildly distended. No visible stones. No biliary ductal dilatation. Mild diffuse low-density throughout the liver compatible with fatty infiltration. Pancreas: No focal abnormality or ductal dilatation. Spleen: No focal abnormality.  Normal size. Adrenals/Urinary Tract: No adrenal abnormality. No focal renal abnormality. No stones or hydronephrosis.  Urinary bladder is unremarkable. Stomach/Bowel: Left colonic diverticulosis. No active diverticulitis. Stomach and small bowel unremarkable. No bowel obstruction. Vascular/Lymphatic: No evidence of aneurysm or adenopathy. Aortic atherosclerosis. Reproductive: Prior hysterectomy.  No adnexal masses. Other: No free fluid or free air. Musculoskeletal: No acute bony abnormality. IMPRESSION: No acute findings. Left colonic diverticulosis. Aortic atherosclerosis. Electronically Signed   By: Charlett Nose M.D.   On: 05/14/2023 23:27    Procedures Procedures    Medications Ordered in ED Medications  alum & mag hydroxide-simeth (MAALOX/MYLANTA) 200-200-20 MG/5ML suspension 30 mL (has no administration in time range)  sodium chloride 0.9 % bolus 1,000 mL (0 mLs Intravenous Stopped 05/14/23 2313)  fentaNYL (SUBLIMAZE) injection 50 mcg (50 mcg Intravenous Given 05/14/23 2200)  ondansetron (ZOFRAN) injection 4 mg (4 mg Intravenous Given 05/14/23 2200)  iohexol (OMNIPAQUE) 300 MG/ML solution 100 mL (100 mLs Intravenous Contrast Given 05/14/23 2257)    ED Course/ Medical Decision Making/ A&P                                 Medical Decision Making Amount and/or Complexity of Data Reviewed Labs:  ordered. Radiology: ordered.  Risk Prescription drug management.   Tonnya V Mckeough is here with right upper quadrant abdominal pain.  History of hypertension, high cholesterol, diabetes.  Differential diagnosis possibly biliary colic, cholelithiasis, cholecystitis, pancreatitis, choledocholithiasis.  Could be less likely bowel obstruction or gas pain/constipation.  I have no concern for ACS or PE.  Does not have any respiratory symptoms or chest pain.  EKG shows sinus rhythm.  No ischemic changes.  Overall will get CBC, CMP, lipase, CT scan abdomen and pelvis.  Will give IV fluids, IV fentanyl and IV Zofran and reevaluate.  Per my review and interpretation the labs is no significant anemia, electrolyte abnormality, kidney injury or leukocytosis.  Patient handed off to oncoming ED staff with patient pending CT scan abdomen pelvis.  Please see his note for further results, evaluation, disposition of the patient.  This chart was dictated using voice recognition software.  Despite best efforts to proofread,  errors can occur which can change the documentation meaning.         Final Clinical Impression(s) / ED Diagnoses Final diagnoses:  Abdominal pain, unspecified abdominal location    Rx / DC Orders ED Discharge Orders     None         Virgina Norfolk, DO 05/15/23 0006

## 2023-05-15 NOTE — ED Notes (Signed)
ED TO INPATIENT HANDOFF REPORT  ED Nurse Name and Phone #: Levander Campion Name/Age/Gender Allison Yoder 72 y.o. female Room/Bed: DB012/DB012  Code Status   Code Status: Prior  Home/SNF/Other Home Patient oriented to: self, place, time, and situation Is this baseline? Yes   Triage Complete: Triage complete  Chief Complaint Acute cholecystitis [K81.0]  Triage Note Pt POV from home reporting bloating and RUQ pain that started after eating dinner. +n/-v.   Allergies Allergies  Allergen Reactions   Cephalexin Itching    Does tolerate augmentin   Codeine Itching and Rash   Inapsine [Droperidol] Shortness Of Breath, Anxiety and Hypertension    Elevated HR and BP, panic attack   Crestor [Rosuvastatin]     Aches   Lipitor [Atorvastatin Calcium]     Myalgias    Metformin And Related     Does not tolerate high-dose metformin due to GI symptoms.   Niacin And Related     Flushing   Pravastatin     Myalgias   Adhesive [Tape] Rash    Blisters with tape   Latex Rash    When gloves are personally worn    Level of Care/Admitting Diagnosis ED Disposition     ED Disposition  Admit   Condition  --   Comment  Hospital Area: [facility] [[id number]]  Level of Care: Med-Surg [16]  Interfacility transfer: Yes  May place patient in observation at [facility] or [facility] if equivalent level of care is available:: Yes  Covid Evaluation: Asymptomatic - no recent exposure (last 10 days) testing not required  Diagnosis: Acute cholecystitis [575.0.ICD-9-CM]  Admitting Physician: Arlean Hopping [[id number]]  Attending Physician: Arlean Hopping [[id number]]          B Medical/Surgery History Past Medical History:  Diagnosis Date   Allergy    Anemia    "all the time as a child"   Anxiety    Arthritis    "all my joints; worse in my hands" (10/27/2015)   Chronic lower back pain    Depression    Diverticulosis    Fatty liver    GERD  (gastroesophageal reflux disease)    Grade I diastolic dysfunction    Heart murmur    "dx'd years ago; very mild; never treated" (10/27/2015)   Hyperlipidemia    Hypertension    Hypothyroidism 2002-~ 2010   Iritis    per [facility]   Migraines    "stopped when I went thru menopause"   NASH (nonalcoholic steatohepatitis)    OSA on CPAP    Pneumonia ~ 2013; 10/27/2015   Recurrent urinary tract infection    Routine general medical examination at a health care facility 02/26/2014   Snores    Stroke (HCC) 2017   TIAs x2.  No deficits   Type II diabetes mellitus (HCC)    "lost weight; started exercising again; don't have it anymore" (10/27/2015)   Urinary incontinence    Wears glasses    Wears hearing aid in both ears    Past Surgical History:  Procedure Laterality Date   BLEPHAROPLASTY Bilateral 2013; 2016   BREAST LUMPECTOMY Right 1964   benign tumor,   CATARACT EXTRACTION W/PHACO Right 01/31/2023   Procedure: CATARACT EXTRACTION PHACO AND INTRAOCULAR LENS PLACEMENT (IOC) RIGHT DIABETIC  5.04  00:36.2;  Surgeon: Galen Manila, MD;  Location: [facility];  Service: Ophthalmology;  Laterality: Right;  Diabetic   CATARACT EXTRACTION W/PHACO Left 02/14/2023   Procedure: CATARACT EXTRACTION  PHACO AND INTRAOCULAR LENS PLACEMENT (IOC) LEFT DIABETIC  5.73  00:40.1;  Surgeon: Galen Manila, MD;  Location: [facility];  Service: Ophthalmology;  Laterality: Left;  Diabetic   HEMORRHOID BANDING  X 2   KNEE ARTHROSCOPY Left 2016   "meniscus repair"   PELVIC FLOOR REPAIR  2001   "lift"   POLYPECTOMY  X 3   "bladder"   SHOULDER ARTHROSCOPY W/ ROTATOR CUFF REPAIR Left 2013   SHOULDER ARTHROSCOPY W/ ROTATOR CUFF REPAIR Right 2015   TUBAL LIGATION  ~ 1986   VAGINAL HYSTERECTOMY  1992   Partial     A IV Location/Drains/Wounds Patient Lines/Drains/Airways Status     Active Line/Drains/Airways     Name Placement date Placement time Site Days   Peripheral IV  03/29/17 Left Antecubital 03/29/17  1053  Antecubital  2238   Peripheral IV 02/14/23 22 G 1" Left Antecubital 02/14/23  0643  Antecubital  90   Wound / Incision (Open or Dehisced) 08/02/21 Puncture Abdomen Medial liver biopsy site 08/02/21  1350  Abdomen  651            Intake/Output Last 24 hours  Intake/Output Summary (Last 24 hours) at 05/15/2023 1610 Last data filed at 05/14/2023 2313 Gross per 24 hour  Intake 1000 ml  Output --  Net 1000 ml    Labs/Imaging Results for orders placed or performed during the hospital encounter of 05/14/23 (from the past 48 hour(s))  Lipase, blood     Status: None   Collection Time: 05/14/23  9:24 PM  Result Value Ref Range   Lipase 45 11 - 51 U/L    Comment: Performed at [facility], [address], [place], [place] [address]  Comprehensive metabolic panel     Status: Abnormal   Collection Time: 05/14/23  9:24 PM  Result Value Ref Range   Sodium 139 135 - 145 mmol/L   Potassium 3.8 3.5 - 5.1 mmol/L   Chloride 105 98 - 111 mmol/L   CO2 24 22 - 32 mmol/L   Glucose, Bld 192 (H) 70 - 99 mg/dL    Comment: Glucose reference range applies only to samples taken after fasting for at least 8 hours.   BUN 17 8 - 23 mg/dL   Creatinine, Ser 4.09 0.44 - 1.00 mg/dL   Calcium 9.2 8.9 - 81.1 mg/dL   Total Protein 6.9 6.5 - 8.1 g/dL   Albumin 4.2 3.5 - 5.0 g/dL   AST 24 15 - 41 U/L   ALT 25 0 - 44 U/L   Alkaline Phosphatase 95 38 - 126 U/L   Total Bilirubin 0.5 0.3 - 1.2 mg/dL   GFR, Estimated >91 >47 mL/min    Comment: (NOTE) Calculated using the CKD-EPI Creatinine Equation (2021)    Anion gap 10 5 - 15    Comment: Performed at [facility], [address] [place], [place] [address]  CBC     Status: None   Collection Time: 05/14/23  9:24 PM  Result Value Ref Range   WBC 9.7 4.0 - 10.5 K/uL   RBC 4.55 3.87 - 5.11 MIL/uL   Hemoglobin 13.8 12.0 - 15.0 g/dL   HCT 21.3 08.6 - 57.8 %   MCV 89.9 80.0 - 100.0  fL   MCH 30.3 26.0 - 34.0 pg   MCHC 33.7 30.0 - 36.0 g/dL   RDW 46.9 62.9 - 52.8 %   Platelets 170 150 - 400 K/uL   nRBC 0.0 0.0 - 0.2 %  Comment: Performed at [facility], [address] [place], [place] [address]  Urinalysis, Routine w reflex microscopic -Urine, Clean Catch     Status: Abnormal   Collection Time: 05/14/23  9:24 PM  Result Value Ref Range   Color, Urine COLORLESS (A) YELLOW   APPearance CLEAR CLEAR   Specific Gravity, Urine >1.046 (H) 1.005 - 1.030   pH 6.5 5.0 - 8.0   Glucose, UA NEGATIVE NEGATIVE mg/dL   Hgb urine dipstick NEGATIVE NEGATIVE   Bilirubin Urine NEGATIVE NEGATIVE   Ketones, ur NEGATIVE NEGATIVE mg/dL   Protein, ur NEGATIVE NEGATIVE mg/dL   Nitrite NEGATIVE NEGATIVE   Leukocytes,Ua NEGATIVE NEGATIVE    Comment: Performed at [facility], [address] [place], [place] [address]   US Abdomen Limited RUQ (LIVER/GB)  Result Date: 05/15/2023 CLINICAL DATA:  72 year old female with right upper quadrant pain since 1900 hours. EXAM: ULTRASOUND ABDOMEN LIMITED RIGHT UPPER QUADRANT COMPARISON:  CT Abdomen and Pelvis yesterday. Previous abdomen ultrasound 01/04/2021. FINDINGS: Gallbladder: The gallbladder appears more distended, and there is trace pericholecystic fluid (image 6) as suggested by CT. Small volume of layering sludge (image 10), but no shadowing echogenic gallstone identified. And overall gallbladder wall thickness remains normal. Common bile duct: Diameter: 7 mm, dilated (4 mm in 2022). No filling defect visible in the duct. Liver: No convincing intrahepatic biliary ductal dilatation. Mildly nodular liver contour (image 35). Background echogenicity within normal limits. No discrete liver lesion. Portal vein is patent on color Doppler imaging with normal direction of blood flow towards the liver. Other: Negative visible right kidney. IMPRESSION: 1. Constellation of distended gallbladder with pericholecystic fluid  and dilated CBD suspicious for acute choledocholithiasis. Small amount of sludge within the gallbladder. No stone identified within the gallbladder or duct. 2. Mildly nodular liver contour raising the possibility of cirrhosis. No discrete liver lesion. Electronically Signed   By: Odessa Fleming M.D.   On: 05/15/2023 05:25   CT ABDOMEN PELVIS W CONTRAST  Result Date: 05/14/2023 CLINICAL DATA:  Abdominal pain, acute, ruq EXAM: CT ABDOMEN AND PELVIS WITH CONTRAST TECHNIQUE: Multidetector CT imaging of the abdomen and pelvis was performed using the standard protocol following bolus administration of intravenous contrast. RADIATION DOSE REDUCTION: This exam was performed according to the departmental dose-optimization program which includes automated exposure control, adjustment of the mA and/or kV according to patient size and/or use of iterative reconstruction technique. CONTRAST:  OMNIPAQUE IOHEXOL 300 MG/ML  SOLN COMPARISON:  04/01/2021 FINDINGS: Lower chest: No acute abnormality Hepatobiliary: Gallbladder is mildly distended. No visible stones. No biliary ductal dilatation. Mild diffuse low-density throughout the liver compatible with fatty infiltration. Pancreas: No focal abnormality or ductal dilatation. Spleen: No focal abnormality.  Normal size. Adrenals/Urinary Tract: No adrenal abnormality. No focal renal abnormality. No stones or hydronephrosis. Urinary bladder is unremarkable. Stomach/Bowel: Left colonic diverticulosis. No active diverticulitis. Stomach and small bowel unremarkable. No bowel obstruction. Vascular/Lymphatic: No evidence of aneurysm or adenopathy. Aortic atherosclerosis. Reproductive: Prior hysterectomy.  No adnexal masses. Other: No free fluid or free air. Musculoskeletal: No acute bony abnormality. IMPRESSION: No acute findings. Left colonic diverticulosis. Aortic atherosclerosis. Electronically Signed   By: Charlett Nose M.D.   On: 05/14/2023 23:27    Pending Labs Unresulted Labs (From  admission, onward)    None       Vitals/Pain Today's Vitals   05/15/23 0545 05/15/23 0545 05/15/23 0555 05/15/23 0615  BP: (!) 143/74   139/78  Pulse: 69   66  Resp: 18  Temp:   98.6 F (37 C)   TempSrc:   Oral   SpO2: 96%   95%  Weight:      Height:      PainSc:  6       Isolation Precautions No active isolations  Medications Medications  HYDROmorphone (DILAUDID) injection 0.5 mg (has no administration in time range)  ciprofloxacin (CIPRO) IVPB 400 mg (400 mg Intravenous New Bag/Given 05/15/23 0544)  sodium chloride 0.9 % bolus 1,000 mL (0 mLs Intravenous Stopped 05/14/23 2313)  fentaNYL (SUBLIMAZE) injection 50 mcg (50 mcg Intravenous Given 05/14/23 2200)  ondansetron (ZOFRAN) injection 4 mg (4 mg Intravenous Given 05/14/23 2200)  iohexol (OMNIPAQUE) 300 MG/ML solution 100 mL (100 mLs Intravenous Contrast Given 05/14/23 2257)  alum & mag hydroxide-simeth (MAALOX/MYLANTA) 200-200-20 MG/5ML suspension 30 mL (30 mLs Oral Given 05/15/23 0011)  HYDROmorphone (DILAUDID) injection 0.5 mg (0.5 mg Intravenous Given 05/15/23 0144)  ondansetron (ZOFRAN) injection 4 mg (4 mg Intravenous Given 05/15/23 0552)    Mobility walks      R Recommendations: See Admitting Provider Note  Report given to:   Additional Notes: A&Ox4, ambulatory without assistive device to restroom. Has her own CPAP from home which she has been using in department tonight.

## 2023-05-15 NOTE — Plan of Care (Signed)

## 2023-05-15 NOTE — Progress Notes (Signed)
Pt has a protective order in place against her son Dorene Sorrow "Financial controller). Pt does not have a copy of the order with her. Pt states she is not concerned he will visit at this time - Pt is the guardian for her grandson Jean Rosenthal who is 73 yrs old. Pt currently lives with her brother, Sarahi Okerson.

## 2023-05-15 NOTE — ED Provider Notes (Signed)
I assumed care of this patient from previous provider.  Please see their note for further details of history, exam, and MDM.   Briefly patient is a 72 y.o. female who presented RUQ pain. Labs reassuring.  Currently pending CT scan.  CT scan revealed dilated gallbladder.  No obvious stones.  Patient still having pain.  Will wait for ultrasound to arrive at 4:30 in the morning to obtain a right upper quadrant ultrasound.  Ultrasound notable for dilated gallbladder with sludge as well as dilated common bile duct at 7 mm and pericholecystic fluid concerning for choledocholithiasis.  No obvious stones.  Empiric antibiotics ordered.  Admitted to medicine for further workup and management with likely MRCP.      Nira Conn, MD 05/15/23 [phone]

## 2023-05-15 NOTE — H&P (Signed)
History and Physical  Allison Yoder ZOX:[id number] DOB: July 07, 1951 DOA: 05/14/2023  PCP: Joaquim Nam, MD   Chief Complaint: Right upper quadrant abdominal pain  HPI: Allison Yoder is a 72 y.o. female with medical history significant for insulin-dependent type 2 diabetes, hypertension, Elita Boone cirrhosis being admitted to the hospital with right upper quadrant abdominal pain concerning for choledocholithiasis.  Patient states that she has had intermittent right upper quadrant discomfort after meals for about a year now, yesterday she finished dinner with her family and had sudden onset of severe right upper quadrant abdominal pain, bloating, and nausea.  No vomiting, diarrhea, fevers or chills.  She presented to [facility] for evaluation.  ED Course: During her ER stay, she was hypertensive but otherwise had normal vital signs.  Lab work was unremarkable, CT of the abdomen and pelvis without acute findings, she had right upper quadrant ultrasound as noted below with distended gallbladder, sludge, and dilated CBD.  She was admitted to the hospital service at [facility] for further evaluation.  Review of Systems: Please see HPI for pertinent positives and negatives. A complete 10 system review of systems are otherwise negative.  Past Medical History:  Diagnosis Date   Allergy    Anemia    "all the time as a child"   Anxiety    Arthritis    "all my joints; worse in my hands" (10/27/2015)   Chronic lower back pain    Depression    Diverticulosis    Fatty liver    GERD (gastroesophageal reflux disease)    Grade I diastolic dysfunction    Heart murmur    "dx'd years ago; very mild; never treated" (10/27/2015)   Hyperlipidemia    Hypertension    Hypothyroidism 2002-~ 2010   Iritis    per [facility]   Migraines    "stopped when I went thru menopause"   NASH (nonalcoholic steatohepatitis)    OSA on CPAP    Pneumonia ~ 2013; 10/27/2015   Recurrent urinary tract infection     Routine general medical examination at a health care facility 02/26/2014   Snores    Stroke (HCC) 2017   TIAs x2.  No deficits   Type II diabetes mellitus (HCC)    "lost weight; started exercising again; don't have it anymore" (10/27/2015)   Urinary incontinence    Wears glasses    Wears hearing aid in both ears    Past Surgical History:  Procedure Laterality Date   BLEPHAROPLASTY Bilateral 2013; 2016   BREAST LUMPECTOMY Right 1964   benign tumor,   CATARACT EXTRACTION W/PHACO Right 01/31/2023   Procedure: CATARACT EXTRACTION PHACO AND INTRAOCULAR LENS PLACEMENT (IOC) RIGHT DIABETIC  5.04  00:36.2;  Surgeon: Galen Manila, MD;  Location: [facility];  Service: Ophthalmology;  Laterality: Right;  Diabetic   CATARACT EXTRACTION W/PHACO Left 02/14/2023   Procedure: CATARACT EXTRACTION PHACO AND INTRAOCULAR LENS PLACEMENT (IOC) LEFT DIABETIC  5.73  00:40.1;  Surgeon: Galen Manila, MD;  Location: [facility];  Service: Ophthalmology;  Laterality: Left;  Diabetic   HEMORRHOID BANDING  X 2   KNEE ARTHROSCOPY Left 2016   "meniscus repair"   PELVIC FLOOR REPAIR  2001   "lift"   POLYPECTOMY  X 3   "bladder"   SHOULDER ARTHROSCOPY W/ ROTATOR CUFF REPAIR Left 2013   SHOULDER ARTHROSCOPY W/ ROTATOR CUFF REPAIR Right 2015   TUBAL LIGATION  ~ 1986   VAGINAL HYSTERECTOMY  1992   Partial  Social History:  reports that she quit smoking about 37 years ago. Her smoking use included cigarettes. She started smoking about 42 years ago. She has a 10 pack-year smoking history. She has never used smokeless tobacco. She reports that she does not currently use alcohol. She reports that she does not use drugs.   Allergies  Allergen Reactions   Cephalexin Itching    Does tolerate augmentin   Codeine Itching and Rash   Inapsine [Droperidol] Shortness Of Breath, Anxiety and Hypertension    Elevated HR and BP, panic attack   Crestor [Rosuvastatin]     Aches   Lipitor  [Atorvastatin Calcium]     Myalgias    Metformin And Related     Does not tolerate high-dose metformin due to GI symptoms.   Niacin And Related     Flushing   Pravastatin     Myalgias   Adhesive [Tape] Rash    Blisters with tape   Latex Rash    When gloves are personally worn    Family History  Problem Relation Age of Onset   Arthritis Mother    Heart disease Mother    Hyperlipidemia Mother    Hypertension Mother    Diabetes Mother    Depression Mother    Stroke Mother    Alcohol abuse Father    Lung cancer Father    Arthritis Maternal Grandmother    Stroke Maternal Grandmother    Diabetes Maternal Grandmother    Heart disease Paternal Uncle        x 2   Arthritis Sister    Breast cancer Cousin    Heart disease Paternal Aunt    Diabetes Paternal Uncle    Diabetes Sister    Heart disease Paternal Uncle        x 5   Heart disease Paternal Aunt        x 3   Stroke Paternal Aunt    Dementia Paternal Grandmother    Colon cancer Neg Hx      Prior to Admission medications   Medication Sig Start Date End Date Taking? Authorizing Provider  albuterol (VENTOLIN HFA) 108 (90 Base) MCG/ACT inhaler INHALE 1 TO 2 PUFFS INTO THE LUNGS EVERY 6 HOURS AS NEEDED FOR WHEEZING OR SHORTNESS OF BREATH 01/19/22   Mort Sawyers, FNP  Blood Glucose Monitoring Suppl (ONE TOUCH ULTRA 2) w/Device KIT Check blood sugar once daily and as instructed. Dx E11.9 12/29/16   Joaquim Nam, MD  citalopram (CELEXA) 20 MG tablet TAKE 1 TABLET(20 MG) BY MOUTH DAILY 11/07/22   Joaquim Nam, MD  clonazePAM (KLONOPIN) 0.5 MG tablet Take 1 tablet (0.5 mg total) by mouth daily as needed for anxiety. 11/16/21   Joaquim Nam, MD  cyanocobalamin 1000 MCG tablet Take 1,000 mcg by mouth daily.    [provider]  cyclobenzaprine (FLEXERIL) 10 MG tablet Take 1 tablet (10 mg total) by mouth daily as needed for muscle spasms. 09/03/19   Joaquim Nam, MD  Evolocumab (REPATHA SURECLICK) 140 MG/ML  SOAJ Inject 140 mg into the skin every 14 (fourteen) days. 08/01/22   Wendall Stade, MD  fluticasone (FLONASE) 50 MCG/ACT nasal spray Place 1 spray into both nostrils daily as needed for allergies or rhinitis.    [provider]  glucose blood (ONETOUCH ULTRA) test strip as directed to check blood sugar daily. Dx E11.9 11/10/22   Joaquim Nam, MD  insulin glargine (LANTUS SOLOSTAR) 100 UNIT/ML Solostar Pen Inject  50 Units into the skin at bedtime. 04/28/23   Joaquim Nam, MD  Insulin Pen Needle (PEN NEEDLES) 32G X 5 MM MISC Use with insulin pen 01/05/23   Joaquim Nam, MD  JANUVIA 50 MG tablet TAKE 1 TABLET(50 MG) BY MOUTH DAILY 10/28/22   Joaquim Nam, MD  Lancets [facility] DELICA PLUS [place]) MISC USE ONCE DAILY AS DIRECTED 11/15/22   Joaquim Nam, MD  lisinopril (ZESTRIL) 40 MG tablet TAKE 1 TABLET(40 MG) BY MOUTH DAILY 10/28/22   Joaquim Nam, MD  metFORMIN (GLUCOPHAGE) 500 MG tablet Take 1 tablet (500 mg total) by mouth 2 (two) times daily with a meal. 11/17/22   Joaquim Nam, MD  metoprolol succinate (TOPROL-XL) 50 MG 24 hr tablet TAKE 1 TABLET BY MOUTH DAILY WITH OR IMMEDIATELY FOLLOWING A MEAL 10/28/22   Joaquim Nam, MD  Multiple Vitamin (MULTIVITAMIN) tablet Take 1 tablet by mouth daily.    [provider]  omeprazole (PRILOSEC) 20 MG capsule TAKE 1 CAPSULE(20 MG) BY MOUTH DAILY 02/10/23   Joaquim Nam, MD  Vitamin D, Cholecalciferol, 1000 units CAPS Take 3,000 Units by mouth daily.    [provider]    Physical Exam: BP (!) 144/82   Pulse 65   Temp 98.6 F (37 C) (Oral)   Resp 18   Ht 5\' 6"  (1.676 m)   Wt 81.6 kg   SpO2 95%   BMI 29.05 kg/m   General:  Alert, oriented, calm, in no acute distress, pleasant and cooperative, looks very comfortable Eyes: EOMI, clear conjuctivae, white sclerea Neck: supple, no masses, trachea mildline  Cardiovascular: RRR, no murmurs or rubs, no peripheral edema  Respiratory: clear to  auscultation bilaterally, no wheezes, no crackles  Abdomen: soft, tender to deep palpation in the right upper quadrant, very slightly distended, normal bowel tones heard  Skin: dry, no rashes  Musculoskeletal: no joint effusions, normal range of motion  Psychiatric: appropriate affect, normal speech  Neurologic: extraocular muscles intact, clear speech, moving all extremities with intact sensorium         Labs on Admission:  Basic Metabolic Panel: Recent Labs  Lab 05/14/23 2124  NA 139  K 3.8  CL 105  CO2 24  GLUCOSE 192*  BUN 17  CREATININE 0.76  CALCIUM 9.2   Liver Function Tests: Recent Labs  Lab 05/14/23 2124  AST 24  ALT 25  ALKPHOS 95  BILITOT 0.5  PROT 6.9  ALBUMIN 4.2   Recent Labs  Lab 05/14/23 2124  LIPASE 45   No results for input(s): "AMMONIA" in the last 168 hours. CBC: Recent Labs  Lab 05/14/23 2124  WBC 9.7  HGB 13.8  HCT 40.9  MCV 89.9  PLT 170   Cardiac Enzymes: No results for input(s): "CKTOTAL", "CKMB", "CKMBINDEX", "TROPONINI" in the last 168 hours.  BNP (last 3 results) No results for input(s): "BNP" in the last 8760 hours.  ProBNP (last 3 results) Recent Labs    06/27/22 1612  PROBNP 45.0    CBG: No results for input(s): "GLUCAP" in the last 168 hours.  Radiological Exams on Admission: US Abdomen Limited RUQ (LIVER/GB)  Result Date: 05/15/2023 CLINICAL DATA:  72 year old female with right upper quadrant pain since 1900 hours. EXAM: ULTRASOUND ABDOMEN LIMITED RIGHT UPPER QUADRANT COMPARISON:  CT Abdomen and Pelvis yesterday. Previous abdomen ultrasound 01/04/2021. FINDINGS: Gallbladder: The gallbladder appears more distended, and there is trace pericholecystic fluid (image 6) as suggested by CT. Small volume of  layering sludge (image 10), but no shadowing echogenic gallstone identified. And overall gallbladder wall thickness remains normal. Common bile duct: Diameter: 7 mm, dilated (4 mm in 2022). No filling defect visible in the  duct. Liver: No convincing intrahepatic biliary ductal dilatation. Mildly nodular liver contour (image 35). Background echogenicity within normal limits. No discrete liver lesion. Portal vein is patent on color Doppler imaging with normal direction of blood flow towards the liver. Other: Negative visible right kidney. IMPRESSION: 1. Constellation of distended gallbladder with pericholecystic fluid and dilated CBD suspicious for acute choledocholithiasis. Small amount of sludge within the gallbladder. No stone identified within the gallbladder or duct. 2. Mildly nodular liver contour raising the possibility of cirrhosis. No discrete liver lesion. Electronically Signed   By: Odessa Fleming M.D.   On: 05/15/2023 05:25   CT ABDOMEN PELVIS W CONTRAST  Result Date: 05/14/2023 CLINICAL DATA:  Abdominal pain, acute, ruq EXAM: CT ABDOMEN AND PELVIS WITH CONTRAST TECHNIQUE: Multidetector CT imaging of the abdomen and pelvis was performed using the standard protocol following bolus administration of intravenous contrast. RADIATION DOSE REDUCTION: This exam was performed according to the departmental dose-optimization program which includes automated exposure control, adjustment of the mA and/or kV according to patient size and/or use of iterative reconstruction technique. CONTRAST:  OMNIPAQUE IOHEXOL 300 MG/ML  SOLN COMPARISON:  04/01/2021 FINDINGS: Lower chest: No acute abnormality Hepatobiliary: Gallbladder is mildly distended. No visible stones. No biliary ductal dilatation. Mild diffuse low-density throughout the liver compatible with fatty infiltration. Pancreas: No focal abnormality or ductal dilatation. Spleen: No focal abnormality.  Normal size. Adrenals/Urinary Tract: No adrenal abnormality. No focal renal abnormality. No stones or hydronephrosis. Urinary bladder is unremarkable. Stomach/Bowel: Left colonic diverticulosis. No active diverticulitis. Stomach and small bowel unremarkable. No bowel obstruction.  Vascular/Lymphatic: No evidence of aneurysm or adenopathy. Aortic atherosclerosis. Reproductive: Prior hysterectomy.  No adnexal masses. Other: No free fluid or free air. Musculoskeletal: No acute bony abnormality. IMPRESSION: No acute findings. Left colonic diverticulosis. Aortic atherosclerosis. Electronically Signed   By: Charlett Nose M.D.   On: 05/14/2023 23:27    Assessment/Plan AVAIYA SAWINSKI is a 72 y.o. female with medical history significant for insulin-dependent type 2 diabetes, hypertension, Elita Boone cirrhosis being admitted to the hospital with right upper quadrant abdominal pain concerning for choledocholithiasis.   Suspected choledocholithiasis-given CBD dilation, gallbladder stones, right upper quadrant abdominal pain after meal.  Note unremarkable LFTs. -Inpatient admission -Keep n.p.o. -Pain and nausea control as needed -MRCP -Discussed with GI Dr. Lorenso Quarry, who will evaluate later today  Insulin-dependent type 2 diabetes-well-controlled with hemoglobin A1c 7.0 on 8/16 -Carb controlled diet when eating -Continue low-dose daily Lantus 20 units (home dose is 50 units daily) -Hold metformin and Januvia -Moderate dose sliding scale  Hypertension-Toprol-XL and lisinopril  NASH cirrhosis-followed at [facility], was seen previously by Dr. Ewing Schlein  GERD-Protonix  DVT prophylaxis: Lovenox     Code Status: Full Code  Consults called: Gastroenterology Dr. Lorenso Quarry  Admission status: The appropriate patient status for this patient is INPATIENT. Inpatient status is judged to be reasonable and necessary in order to provide the required intensity of service to ensure the patient's safety. The patient's presenting symptoms, physical exam findings, and initial radiographic and laboratory data in the context of their chronic comorbidities is felt to place them at high risk for further clinical deterioration. Furthermore, it is not anticipated that the patient will be medically stable for discharge  from the hospital within 2 midnights of admission.    I  certify that at the point of admission it is my clinical judgment that the patient will require inpatient hospital care spanning beyond 2 midnights from the point of admission due to high intensity of service, high risk for further deterioration and high frequency of surveillance required  Time spent: 49 minutes  Roshaun Pound Sharlette Dense MD [facility] Pager [phone]  If 7PM-7AM, please contact night-coverage [email or web address] Password [email or web address]  05/15/2023, 8:50 AM

## 2023-05-15 NOTE — Progress Notes (Signed)
   05/15/23 1539  TOC Brief Assessment  Patient has primary care physician Yes  Home environment has been reviewed yes  Prior level of function: independent  Prior/Current Home Services No current home services  Social Determinants of Health Reivew SDOH reviewed no interventions necessary  Readmission risk has been reviewed Yes  Transition of care needs no transition of care needs at this time

## 2023-05-16 ENCOUNTER — Inpatient Hospital Stay (HOSPITAL_COMMUNITY): Payer: Medicare Other

## 2023-05-16 DIAGNOSIS — R1011 Right upper quadrant pain: Secondary | ICD-10-CM | POA: Diagnosis not present

## 2023-05-16 DIAGNOSIS — R932 Abnormal findings on diagnostic imaging of liver and biliary tract: Secondary | ICD-10-CM | POA: Diagnosis not present

## 2023-05-16 DIAGNOSIS — K838 Other specified diseases of biliary tract: Secondary | ICD-10-CM | POA: Diagnosis not present

## 2023-05-16 DIAGNOSIS — K8050 Calculus of bile duct without cholangitis or cholecystitis without obstruction: Secondary | ICD-10-CM

## 2023-05-16 DIAGNOSIS — K819 Cholecystitis, unspecified: Secondary | ICD-10-CM | POA: Diagnosis not present

## 2023-05-16 DIAGNOSIS — K760 Fatty (change of) liver, not elsewhere classified: Secondary | ICD-10-CM | POA: Diagnosis not present

## 2023-05-16 DIAGNOSIS — K7460 Unspecified cirrhosis of liver: Secondary | ICD-10-CM | POA: Diagnosis not present

## 2023-05-16 LAB — CBC
HCT: 39 % (ref 36.0–46.0)
Hemoglobin: 12.6 g/dL (ref 12.0–15.0)
MCH: 29.9 pg (ref 26.0–34.0)
MCHC: 32.3 g/dL (ref 30.0–36.0)
MCV: 92.6 fL (ref 80.0–100.0)
Platelets: 136 10*3/uL — ABNORMAL LOW (ref 150–400)
RBC: 4.21 MIL/uL (ref 3.87–5.11)
RDW: 13.8 % (ref 11.5–15.5)
WBC: 6.9 10*3/uL (ref 4.0–10.5)
nRBC: 0 % (ref 0.0–0.2)

## 2023-05-16 LAB — COMPREHENSIVE METABOLIC PANEL
ALT: 28 U/L (ref 0–44)
AST: 28 U/L (ref 15–41)
Albumin: 3.5 g/dL (ref 3.5–5.0)
Alkaline Phosphatase: 78 U/L (ref 38–126)
Anion gap: 6 (ref 5–15)
BUN: 14 mg/dL (ref 8–23)
CO2: 26 mmol/L (ref 22–32)
Calcium: 8.8 mg/dL — ABNORMAL LOW (ref 8.9–10.3)
Chloride: 108 mmol/L (ref 98–111)
Creatinine, Ser: 0.72 mg/dL (ref 0.44–1.00)
GFR, Estimated: >60 mL/min (ref >60)
Glucose, Bld: 108 mg/dL — ABNORMAL HIGH (ref 70–99)
Potassium: 4 mmol/L (ref 3.5–5.1)
Sodium: 140 mmol/L (ref 135–145)
Total Bilirubin: 0.6 mg/dL (ref 0.3–1.2)
Total Protein: 6.4 g/dL — ABNORMAL LOW (ref 6.5–8.1)

## 2023-05-16 LAB — GLUCOSE, CAPILLARY
Glucose-Capillary: 115 mg/dL — ABNORMAL HIGH (ref 70–99)
Glucose-Capillary: 154 mg/dL — ABNORMAL HIGH (ref 70–99)
Glucose-Capillary: 101 mg/dL — ABNORMAL HIGH (ref 70–99)

## 2023-05-16 MED ORDER — GADOBUTROL 1 MMOL/ML IV SOLN
8.0000 mL | Freq: Once | INTRAVENOUS | Status: AC | PRN
  Administered 2023-05-16: 8 mL via INTRAVENOUS

## 2023-05-16 MED ORDER — PANTOPRAZOLE SODIUM 40 MG PO TBEC: 40.0000 mg | DELAYED_RELEASE_TABLET | Freq: Every day | ORAL | 1 refills | Status: DC

## 2023-05-16 NOTE — Progress Notes (Signed)
[facility] Progress Note  Allison Yoder 71 y.o. [birth year]   Subjective: Feels good. Resting in bed with cpap machine on but easily wakes up.  Objective: Vital signs: Vitals:   05/15/23 2013 05/16/23 0436  BP: 135/65 136/69  Pulse: 72 (!) 59  Resp: 19 18  Temp: 98.1 F (36.7 C) 98.1 F (36.7 C)  SpO2: 95% 94%    Physical Exam: Gen: lethargic, elderly, well-nourished, no acute distress  HEENT: anicteric sclera CV: RRR Chest: CTA B Abd: soft, nontender, nondistended, +BS Ext: no edema  Lab Results: Recent Labs    05/14/23 2124 05/16/23 0352  NA 139 140  K 3.8 4.0  CL 105 108  CO2 24 26  GLUCOSE 192* 108*  BUN 17 14  CREATININE 0.76 0.72  CALCIUM 9.2 8.8*   Recent Labs    05/14/23 2124 05/16/23 0352  AST 24 28  ALT 25 28  ALKPHOS 95 78  BILITOT 0.5 0.6  PROT 6.9 6.4*  ALBUMIN 4.2 3.5   Recent Labs    05/14/23 2124 05/16/23 0352  WBC 9.7 6.9  HGB 13.8 12.6  HCT 40.9 39.0  MCV 89.9 92.6  PLT 170 136*      Assessment/Plan: RUQ pain in the setting of cirrhosis with MRCP showing mild CBD dilation; Negative for CBD stone. LFTs normal. No acute cholecystitis. Will start low sodium diet. I do not think inpatient surgical evaluation for her GB is needed at this time and can be arranged as outpt if signs of biliary colic develop. With her cirrhosis she is at increased risk for complications of surgery so may not be a candidate for surgery if cholecystitis develops. [facility] will sign off. Patient should continue to f/u with [facility]. Call us if questions.   Allison Yoder 05/16/2023, 11:37 AM  Questions please call [phone] ID: Allison Yoder, female   DOB: 08/14/1951, 72 y.o.   MRN: [id number]

## 2023-05-16 NOTE — Care Management Obs Status (Signed)
MEDICARE OBSERVATION STATUS NOTIFICATION   Patient Details  Name: Allison Yoder MRN: [id number] Date of Birth: [birth year]   Medicare Observation Status Notification Given:  Yes    Harriett Sine, RN 05/16/2023, 1:34 PM

## 2023-05-16 NOTE — Care Management CC44 (Signed)
Condition Code 44 Documentation Completed  Patient Details  Name: Allison Yoder MRN: [id number] Date of Birth: 03-05-1951   Condition Code 44 given:  Yes Patient signature on Condition Code 44 notice:  Yes Documentation of 2 MD's agreement:  Yes Code 44 added to claim:  Yes    Armanda Heritage, RN 05/16/2023, 3:28 PM

## 2023-05-16 NOTE — Plan of Care (Signed)

## 2023-05-16 NOTE — Discharge Summary (Signed)
Physician Discharge Summary  Allison Yoder ZOX:[id number] DOB: August 26, 1951 DOA: 05/14/2023  PCP: Joaquim Nam, MD  Admit date: 05/14/2023 Discharge date: 05/16/2023 Admitted From: Home Disposition: Home Recommendations for Outpatient Follow-up:  Follow up with PCP in 1 to 2 weeks Outpatient follow-up with GI as previously planned or sooner if needed Check CBC and CMP at follow-up Please follow up on the following pending results: None  Home Health: Not indicated Equipment/Devices: Not indicated  Discharge Condition: Stable CODE STATUS: Full code  Follow-up Information     Joaquim Nam, MD. Schedule an appointment as soon as possible for a visit in 1 week(s).   Specialty: Family Medicine Contact information: [address] [place] [address] [phone]                 Hospital course 73 year old F with PMH of 9 cirrhosis followed by GI at [facility], IDDM-2, OSA on CPAP, HTN anxiety and depression presented with acute on chronic RUQ pain after eating dinner the night before presentation.  She had associated nausea and bloating.  Basic labs including CBC and CMP without significant finding.  Lipase within normal.  CT without acute finding.  RUQ ultrasound with distended gallbladder, sludge and 7 mm common bile duct.  GI consulted and recommended MRCP  The next day, patient's symptoms basically resolved.  MRCP without significant finding.  She tolerated diet.  Evaluated by [facility] and cleared for discharge.  Recommended trial of p.o. Protonix 40 mg daily.  She was on omeprazole 20 mg daily before.  See individual problem list below for more.   Problems addressed during this hospitalization Principal Problem:   RUQ abdominal pain              Time spent 35 minutes  Vital signs Vitals:   05/15/23 1718 05/15/23 2013 05/16/23 0436 05/16/23 1241  BP: (!) 154/85 135/65 136/69 (!) 155/80  Pulse: 73 72 (!) 59 61  Temp: 98.5 F (36.9 C) 98.1 F (36.7  C) 98.1 F (36.7 C) 98.6 F (37 C)  Resp: 16 19 18 16   Height:      Weight:      SpO2: 96% 95% 94% 95%  TempSrc:      BMI (Calculated):         Discharge exam  GENERAL: No apparent distress.  Nontoxic. HEENT: MMM.  Vision and hearing grossly intact.  NECK: Supple.  No apparent JVD.  RESP:  No IWOB.  Fair aeration bilaterally. CVS:  RRR. Heart sounds normal.  ABD/GI/GU: BS+. Abd soft, NTND.  MSK/EXT:  Moves extremities. No apparent deformity. No edema.  SKIN: no apparent skin lesion or wound NEURO: Awake and alert. Oriented appropriately.  No apparent focal neuro deficit. PSYCH: Calm. Normal affect.   Discharge Instructions Discharge Instructions     Diet - low sodium heart healthy   Complete by: As directed    Diet Carb Modified   Complete by: As directed    Discharge instructions   Complete by: As directed    It has been a pleasure taking care of you!  You were hospitalized due to abdominal pain which could be biliary colic.  Your symptoms improved.  Your MRI did not show anything that requires surgical intervention.  No signs of infection either.  Avoid or minimize fluid high in fat.  You may try Protonix if it helps.  Follow-up with your primary care doctor in 1 to 2 weeks or sooner if needed.  You  may follow-up with your gastroenterologist as previously planned or sooner if needed   Take care,   Increase activity slowly   Complete by: As directed       Allergies as of 05/16/2023       Reactions   Cephalexin Itching   Does tolerate augmentin   Codeine Itching, Rash   Inapsine [droperidol] Shortness Of Breath, Anxiety, Hypertension   Elevated HR and BP, panic attack   Crestor [rosuvastatin]    Aches   Lipitor [atorvastatin Calcium]    Myalgias   Metformin And Related    Does not tolerate high-dose metformin due to GI symptoms.   Niacin And Related    Flushing   Pravastatin    Myalgias   Adhesive [tape] Rash   Blisters with tape   Latex Rash   When  gloves are personally worn        Medication List     STOP taking these medications    omeprazole 20 MG capsule Commonly known as: PRILOSEC Replaced by: pantoprazole 40 MG tablet       TAKE these medications    albuterol 108 (90 Base) MCG/ACT inhaler Commonly known as: VENTOLIN HFA INHALE 1 TO 2 PUFFS INTO THE LUNGS EVERY 6 HOURS AS NEEDED FOR WHEEZING OR SHORTNESS OF BREATH What changed:  how much to take how to take this when to take this reasons to take this additional instructions   betamethasone valerate ointment 0.1 % Commonly known as: VALISONE Apply 1 Application topically daily.   citalopram 20 MG tablet Commonly known as: CELEXA TAKE 1 TABLET(20 MG) BY MOUTH DAILY What changed: See the new instructions.   clonazePAM 0.5 MG tablet Commonly known as: KLONOPIN Take 1 tablet (0.5 mg total) by mouth daily as needed for anxiety.   cyanocobalamin 1000 MCG tablet Take 1,000 mcg by mouth daily.   cyclobenzaprine 10 MG tablet Commonly known as: FLEXERIL Take 1 tablet (10 mg total) by mouth daily as needed for muscle spasms.   fluticasone 50 MCG/ACT nasal spray Commonly known as: FLONASE Place 1 spray into both nostrils daily as needed for allergies or rhinitis.   Januvia 50 MG tablet Generic drug: sitaGLIPtin TAKE 1 TABLET(50 MG) BY MOUTH DAILY What changed: See the new instructions.   Lantus SoloStar 100 UNIT/ML Solostar Pen Generic drug: insulin glargine Inject 50 Units into the skin at bedtime.   lisinopril 40 MG tablet Commonly known as: ZESTRIL TAKE 1 TABLET(40 MG) BY MOUTH DAILY What changed: See the new instructions.   metFORMIN 500 MG tablet Commonly known as: GLUCOPHAGE Take 1 tablet (500 mg total) by mouth 2 (two) times daily with a meal.   metoprolol succinate 50 MG 24 hr tablet Commonly known as: TOPROL-XL TAKE 1 TABLET BY MOUTH DAILY WITH OR IMMEDIATELY FOLLOWING A MEAL What changed: See the new instructions.   multivitamin  tablet Take 1 tablet by mouth daily.   ONE TOUCH ULTRA 2 w/Device Kit Check blood sugar once daily and as instructed. Dx E11.9   OneTouch Delica Plus Lancet33G Misc USE ONCE DAILY AS DIRECTED   OneTouch Ultra test strip Generic drug: glucose blood as directed to check blood sugar daily. Dx E11.9   pantoprazole 40 MG tablet Commonly known as: PROTONIX Take 1 tablet (40 mg total) by mouth daily. Start taking on: May 17, 2023 Replaces: omeprazole 20 MG capsule   Pen Needles 32G X 5 MM Misc Use with insulin pen   Repatha SureClick 140 MG/ML Soaj Generic drug: Evolocumab  Inject 140 mg into the skin every 14 (fourteen) days.   Vitamin D (Cholecalciferol) 25 MCG (1000 UT) Caps Take 3,000 Units by mouth daily.        Consultations: Gastroenterology  Procedures/Studies:    MR ABDOMEN MRCP W WO CONTAST  Result Date: 05/16/2023 CLINICAL DATA:  Right upper quadrant pain for 2 days. Suspected cholecystitis and biliary ductal dilatation on recent ultrasound. EXAM: MRI ABDOMEN WITHOUT AND WITH CONTRAST (INCLUDING MRCP) TECHNIQUE: Multiplanar multisequence MR imaging of the abdomen was performed both before and after the administration of intravenous contrast. Heavily T2-weighted images of the biliary and pancreatic ducts were obtained, and three-dimensional MRCP images were rendered by post processing. CONTRAST:  8mL GADAVIST GADOBUTROL 1 MMOL/ML IV SOLN COMPARISON:  CT on 05/14/2023 FINDINGS: Lower chest: No acute findings. Hepatobiliary: No hepatic masses identified. Mild-to-moderate diffuse hepatic steatosis noted. No gross morphologic signs of cirrhosis noted. No definite gallstones are seen. Trace pericholecystic fluid noted, however there is no No evidence of abnormal gallbladder wall thickening or pericholecystic inflammatory changes. Mild dilatation of common bile duct is seen measuring 8 mm in diameter, without significant dilatation of intrahepatic bile ducts. Smooth tapering  of the common bile duct is seen, without evidence of choledocholithiasis or stricture. Pancreas: No mass or inflammatory changes. No evidence of pancreatic ductal dilatation or pancreas divisum. Spleen:  Within normal limits in size and appearance. Adrenals/Urinary Tract: No suspicious masses identified. No evidence of hydronephrosis. Stomach/Bowel: Unremarkable. Vascular/Lymphatic: No pathologically enlarged lymph nodes identified. No acute vascular findings. Other:  None. Musculoskeletal:  No suspicious bone lesions identified. IMPRESSION: Trace pericholecystic fluid, which is nonspecific. No other radiographic signs of acute cholecystitis. Mild dilatation of common bile duct measuring 8 mm, without evidence of choledocholithiasis or other obstructing etiology. Mild-to-moderate hepatic steatosis. Electronically Signed   By: Danae Orleans M.D.   On: 05/16/2023 11:17   MR 3D Recon At Scanner  Result Date: 05/16/2023 CLINICAL DATA:  Right upper quadrant pain for 2 days. Suspected cholecystitis and biliary ductal dilatation on recent ultrasound. EXAM: MRI ABDOMEN WITHOUT AND WITH CONTRAST (INCLUDING MRCP) TECHNIQUE: Multiplanar multisequence MR imaging of the abdomen was performed both before and after the administration of intravenous contrast. Heavily T2-weighted images of the biliary and pancreatic ducts were obtained, and three-dimensional MRCP images were rendered by post processing. CONTRAST:  8mL GADAVIST GADOBUTROL 1 MMOL/ML IV SOLN COMPARISON:  CT on 05/14/2023 FINDINGS: Lower chest: No acute findings. Hepatobiliary: No hepatic masses identified. Mild-to-moderate diffuse hepatic steatosis noted. No gross morphologic signs of cirrhosis noted. No definite gallstones are seen. Trace pericholecystic fluid noted, however there is no No evidence of abnormal gallbladder wall thickening or pericholecystic inflammatory changes. Mild dilatation of common bile duct is seen measuring 8 mm in diameter, without  significant dilatation of intrahepatic bile ducts. Smooth tapering of the common bile duct is seen, without evidence of choledocholithiasis or stricture. Pancreas: No mass or inflammatory changes. No evidence of pancreatic ductal dilatation or pancreas divisum. Spleen:  Within normal limits in size and appearance. Adrenals/Urinary Tract: No suspicious masses identified. No evidence of hydronephrosis. Stomach/Bowel: Unremarkable. Vascular/Lymphatic: No pathologically enlarged lymph nodes identified. No acute vascular findings. Other:  None. Musculoskeletal:  No suspicious bone lesions identified. IMPRESSION: Trace pericholecystic fluid, which is nonspecific. No other radiographic signs of acute cholecystitis. Mild dilatation of common bile duct measuring 8 mm, without evidence of choledocholithiasis or other obstructing etiology. Mild-to-moderate hepatic steatosis. Electronically Signed   By: Danae Orleans M.D.   On: 05/16/2023 11:17  US Abdomen Limited RUQ (LIVER/GB)  Result Date: 05/15/2023 CLINICAL DATA:  72 year old female with right upper quadrant pain since 1900 hours. EXAM: ULTRASOUND ABDOMEN LIMITED RIGHT UPPER QUADRANT COMPARISON:  CT Abdomen and Pelvis yesterday. Previous abdomen ultrasound 01/04/2021. FINDINGS: Gallbladder: The gallbladder appears more distended, and there is trace pericholecystic fluid (image 6) as suggested by CT. Small volume of layering sludge (image 10), but no shadowing echogenic gallstone identified. And overall gallbladder wall thickness remains normal. Common bile duct: Diameter: 7 mm, dilated (4 mm in 2022). No filling defect visible in the duct. Liver: No convincing intrahepatic biliary ductal dilatation. Mildly nodular liver contour (image 35). Background echogenicity within normal limits. No discrete liver lesion. Portal vein is patent on color Doppler imaging with normal direction of blood flow towards the liver. Other: Negative visible right kidney. IMPRESSION: 1.  Constellation of distended gallbladder with pericholecystic fluid and dilated CBD suspicious for acute choledocholithiasis. Small amount of sludge within the gallbladder. No stone identified within the gallbladder or duct. 2. Mildly nodular liver contour raising the possibility of cirrhosis. No discrete liver lesion. Electronically Signed   By: Odessa Fleming M.D.   On: 05/15/2023 05:25   CT ABDOMEN PELVIS W CONTRAST  Result Date: 05/14/2023 CLINICAL DATA:  Abdominal pain, acute, ruq EXAM: CT ABDOMEN AND PELVIS WITH CONTRAST TECHNIQUE: Multidetector CT imaging of the abdomen and pelvis was performed using the standard protocol following bolus administration of intravenous contrast. RADIATION DOSE REDUCTION: This exam was performed according to the departmental dose-optimization program which includes automated exposure control, adjustment of the mA and/or kV according to patient size and/or use of iterative reconstruction technique. CONTRAST:  OMNIPAQUE IOHEXOL 300 MG/ML  SOLN COMPARISON:  04/01/2021 FINDINGS: Lower chest: No acute abnormality Hepatobiliary: Gallbladder is mildly distended. No visible stones. No biliary ductal dilatation. Mild diffuse low-density throughout the liver compatible with fatty infiltration. Pancreas: No focal abnormality or ductal dilatation. Spleen: No focal abnormality.  Normal size. Adrenals/Urinary Tract: No adrenal abnormality. No focal renal abnormality. No stones or hydronephrosis. Urinary bladder is unremarkable. Stomach/Bowel: Left colonic diverticulosis. No active diverticulitis. Stomach and small bowel unremarkable. No bowel obstruction. Vascular/Lymphatic: No evidence of aneurysm or adenopathy. Aortic atherosclerosis. Reproductive: Prior hysterectomy.  No adnexal masses. Other: No free fluid or free air. Musculoskeletal: No acute bony abnormality. IMPRESSION: No acute findings. Left colonic diverticulosis. Aortic atherosclerosis. Electronically Signed   By: Charlett Nose  M.D.   On: 05/14/2023 23:27       The results of significant diagnostics from this hospitalization (including imaging, microbiology, ancillary and laboratory) are listed below for reference.     Microbiology: No results found for this or any previous visit (from the past 240 hour(s)).   Labs:  CBC: Recent Labs  Lab 05/14/23 2124 05/16/23 0352  WBC 9.7 6.9  HGB 13.8 12.6  HCT 40.9 39.0  MCV 89.9 92.6  PLT 170 136*   BMP &GFR Recent Labs  Lab 05/14/23 2124 05/16/23 0352  NA 139 140  K 3.8 4.0  CL 105 108  CO2 24 26  GLUCOSE 192* 108*  BUN 17 14  CREATININE 0.76 0.72  CALCIUM 9.2 8.8*   Estimated Creatinine Clearance: 68.4 mL/min (by C-G formula based on SCr of 0.72 mg/dL). Liver & Pancreas: Recent Labs  Lab 05/14/23 2124 05/16/23 0352  AST 24 28  ALT 25 28  ALKPHOS 95 78  BILITOT 0.5 0.6  PROT 6.9 6.4*  ALBUMIN 4.2 3.5   Recent Labs  Lab 05/14/23 2124  LIPASE 45   No results for input(s): "AMMONIA" in the last 168 hours. Diabetic: No results for input(s): "HGBA1C" in the last 72 hours. Recent Labs  Lab 05/15/23 2015 05/15/23 2358 05/16/23 0438 05/16/23 0837 05/16/23 1242  GLUCAP 154* 156* 115* 154* 101*   Cardiac Enzymes: No results for input(s): "CKTOTAL", "CKMB", "CKMBINDEX", "TROPONINI" in the last 168 hours. Recent Labs    06/27/22 1612  PROBNP 45.0   Coagulation Profile: No results for input(s): "INR", "PROTIME" in the last 168 hours. Thyroid Function Tests: No results for input(s): "TSH", "T4TOTAL", "FREET4", "T3FREE", "THYROIDAB" in the last 72 hours. Lipid Profile: No results for input(s): "CHOL", "HDL", "LDLCALC", "TRIG", "CHOLHDL", "LDLDIRECT" in the last 72 hours. Anemia Panel: No results for input(s): "VITAMINB12", "FOLATE", "FERRITIN", "TIBC", "IRON", "RETICCTPCT" in the last 72 hours. Urine analysis:    Component Value Date/Time   COLORURINE COLORLESS (A) 05/14/2023 2124   APPEARANCEUR CLEAR 05/14/2023 2124   LABSPEC  >1.046 (H) 05/14/2023 2124   PHURINE 6.5 05/14/2023 2124   GLUCOSEU NEGATIVE 05/14/2023 2124   GLUCOSEU 100 (A) 11/17/2022 1610   HGBUR NEGATIVE 05/14/2023 2124   BILIRUBINUR NEGATIVE 05/14/2023 2124   BILIRUBINUR neg 06/07/2021 1210   KETONESUR NEGATIVE 05/14/2023 2124   PROTEINUR NEGATIVE 05/14/2023 2124   UROBILINOGEN 0.2 11/17/2022 1610   NITRITE NEGATIVE 05/14/2023 2124   LEUKOCYTESUR NEGATIVE 05/14/2023 2124   Sepsis Labs: Invalid input(s): "PROCALCITONIN", "LACTICIDVEN"   SIGNED:  Almon Hercules, MD  [facility] 05/16/2023, 8:05 PM

## 2023-05-16 NOTE — Progress Notes (Signed)
Spoke with pt at bedside about Code 44 form. Pt was referred to Medicare number and website address at bottom of form for detailed information.   05/16/23 1404  TOC Discharge Assessment  Final next level of care Home/Self Care  Once discharged, how will the patient get to their discharge location? Family/Friend - Partnered Transport  Has discharge transport plan been identified? Yes  Barriers to Discharge No Barriers Identified  Patient states their goals for this hospitalization and ongoing recovery are: none stated  CMS Medicare.gov Compare Post Acute Care list provided to: Other (Comment Required) (none)  Choice offered to / list presented to  NA  [facility] ownership interest in [facility].provided to: Parent NA  DME Agency NA  Patient and family notified of of transfer 05/16/23

## 2023-05-23 ENCOUNTER — Encounter: Payer: Self-pay | Admitting: Pharmacist

## 2023-05-23 ENCOUNTER — Ambulatory Visit: Payer: Medicare Other | Admitting: Physical Therapy

## 2023-05-24 ENCOUNTER — Ambulatory Visit: Payer: Medicare Other | Attending: Family Medicine | Admitting: Physical Therapy

## 2023-05-24 DIAGNOSIS — M5412 Radiculopathy, cervical region: Secondary | ICD-10-CM | POA: Diagnosis not present

## 2023-05-24 DIAGNOSIS — M542 Cervicalgia: Secondary | ICD-10-CM

## 2023-05-24 DIAGNOSIS — R202 Paresthesia of skin: Secondary | ICD-10-CM | POA: Diagnosis not present

## 2023-05-24 DIAGNOSIS — M6281 Muscle weakness (generalized): Secondary | ICD-10-CM

## 2023-05-24 NOTE — Therapy (Signed)
OUTPATIENT PHYSICAL THERAPY CERVICAL EVALUATION   Patient Name: Allison Yoder MRN: [id number] DOB:30-Sep-1950, 72 y.o., female Today's Date: 05/24/2023  END OF SESSION:  PT End of Session - 05/24/23 1028     Visit Number 1    Number of Visits 5   with eval   Date for PT Re-Evaluation 07/05/23    Authorization Type [employer] Medicare    PT Start Time 1028   pt arrived late   PT Stop Time 1050   eval   PT Time Calculation (min) 22 min    Activity Tolerance Patient tolerated treatment well    Behavior During Therapy WFL for tasks assessed/performed             Past Medical History:  Diagnosis Date   Allergy    Anemia    "all the time as a child"   Anxiety    Arthritis    "all my joints; worse in my hands" (10/27/2015)   Chronic lower back pain    Depression    Diverticulosis    Fatty liver    GERD (gastroesophageal reflux disease)    Grade I diastolic dysfunction    Heart murmur    "dx'd years ago; very mild; never treated" (10/27/2015)   Hyperlipidemia    Hypertension    Hypothyroidism 2002-~ 2010   Iritis    per [facility]   Migraines    "stopped when I went thru menopause"   NASH (nonalcoholic steatohepatitis)    OSA on CPAP    Pneumonia ~ 2013; 10/27/2015   Recurrent urinary tract infection    Routine general medical examination at a health care facility 02/26/2014   Snores    Stroke (HCC) 2017   TIAs x2.  No deficits   Type II diabetes mellitus (HCC)    "lost weight; started exercising again; don't have it anymore" (10/27/2015)   Urinary incontinence    Wears glasses    Wears hearing aid in both ears    Past Surgical History:  Procedure Laterality Date   BLEPHAROPLASTY Bilateral 2013; 2016   BREAST LUMPECTOMY Right 1964   benign tumor,   CATARACT EXTRACTION W/PHACO Right 01/31/2023   Procedure: CATARACT EXTRACTION PHACO AND INTRAOCULAR LENS PLACEMENT (IOC) RIGHT DIABETIC  5.04  00:36.2;  Surgeon: Galen Manila, MD;  Location: [facility];  Service: Ophthalmology;  Laterality: Right;  Diabetic   CATARACT EXTRACTION W/PHACO Left 02/14/2023   Procedure: CATARACT EXTRACTION PHACO AND INTRAOCULAR LENS PLACEMENT (IOC) LEFT DIABETIC  5.73  00:40.1;  Surgeon: Galen Manila, MD;  Location[facility];  Service: Ophthalmology;  Laterality: Left;  Diabetic   HEMORRHOID BANDING  X 2   KNEE ARTHROSCOPY Left 2016   "meniscus repair"   PELVIC FLOOR REPAIR  2001   "lift"   POLYPECTOMY  X 3   "bladder"   SHOULDER ARTHROSCOPY W/ ROTATOR CUFF REPAIR Left 2013   SHOULDER ARTHROSCOPY W/ ROTATOR CUFF REPAIR Right 2015   TUBAL LIGATION  ~ 1986   VAGINAL HYSTERECTOMY  1992   Partial   Patient Active Problem List   Diagnosis Date Noted   RUQ abdominal pain 05/15/2023   Hand paresthesia 04/30/2023   Urine abnormality 11/19/2022   BPV (benign positional vertigo) 11/19/2022   Sore throat 10/12/2022   Acute non-recurrent frontal sinusitis 09/26/2022   Drug-induced myopathy 02/18/2022   Acute nonsuppurative otitis media of right ear 01/19/2022   Wheezing 01/19/2022   Abnormal liver function 11/16/2021   Diverticulitis of colon 11/16/2021  Hematochezia 11/16/2021   Deviated septum 11/09/2021   Urinary frequency 06/09/2021   SOBOE (shortness of breath on exertion) 06/09/2021   Facial droop 05/20/2021   Dizziness 05/20/2021   LFT elevation 03/28/2021   Right hip pain 01/31/2021   Other social stressor 12/13/2020   Dysphagia 07/22/2020   Abnormal cervical Papanicolaou smear 04/14/2020   Healthcare maintenance 07/22/2019   Cough 07/22/2019   Arthralgia 07/22/2019   Medicare annual wellness visit, initial 06/10/2018   Chest heaviness 06/10/2018   Osteopenia 09/10/2017   Fatty liver 05/19/2017   Diabetes mellitus without complication (HCC) 05/05/2017   History of cerebrovascular accident (CVA) due to ischemia 04/08/2016   Hypertensive disorder 10/27/2015   Advance care planning 04/29/2015   Obstructive sleep apnea of  adult 07/25/2014   Palpitations 01/06/2013   Anxiety 09/01/2012   Shoulder pain 12/19/2011   MDD (major depressive disorder) 12/19/2011   OA (osteoarthritis) 12/19/2011   Hyperlipidemia associated with type 2 diabetes mellitus (HCC) 12/19/2011   Diverticulitis 12/19/2011   Migraines 12/19/2011   Hypothyroid 12/19/2011   Iritis 12/19/2011    PCP: Joaquim Nam, MD  REFERRING PROVIDER: Joaquim Nam, MD  REFERRING DIAG: M54.2 (ICD-10-CM) - Neck pain R20.2 (ICD-10-CM) - Hand paresthesia  THERAPY DIAG:  Muscle weakness (generalized)  Radiculopathy, cervical region  Cervicalgia  Rationale for Evaluation and Treatment: Rehabilitation  ONSET DATE: 04/28/2023 (referral date)  SUBJECTIVE:                                                                                                                                                                                                         SUBJECTIVE STATEMENT: Pt reports she has been having intermittent numbness in her index finger on L hand for about 6-7 months and it has gradually worsened. Pt reports having pain in her L forearm occasionally and that the numbness does radiate into her L thumb and middle finger at times.  Pt works as an Hospital doctor (desk job) and it has affected her typing a bit, she tries to ignore it. Pt reports it has not prevented her from any hobbies or ADLs, just aggravating.  Pt reports that she has had limited motion in her neck for a long time (makes driving difficult), had some rolfing done a month ago and it helped relieve tightness and helped with her posture, expensive though.  Pt also endorses waking up with headaches, 2-3 times per week -- they don't last too long and go away with Tylenol.  Hand dominance: Right  PERTINENT HISTORY:  PMH: insulin-dependent type 2 diabetes,  hypertension, Nash cirrhosis  PAIN:  Are you having pain? No  PRECAUTIONS: None  RED FLAGS: None    WEIGHT BEARING  RESTRICTIONS: No  FALLS:  Has patient fallen in last 6 months? Yes. Number of falls one fall, landed on her bottom  LIVING ENVIRONMENT: Lives with: lives with their family (takes care of her 6 yo grandson) Lives in: House/apartment  OCCUPATION: still works full time -- Hospital doctor  PLOF: Independent with gait and Independent with transfers  PATIENT GOALS: "I want my finger back"  NEXT MD VISIT: sees Dr. Para March this Friday morning (9/13)  OBJECTIVE:   DIAGNOSTIC FINDINGS:  Plan to try PT before any imaginig will be completed  COGNITION: Overall cognitive status: Within functional limits for tasks assessed  SENSATION: Light touch intact except in left index finger  POSTURE: rounded shoulders and forward head  PALPATION: Tightness in suboccipitals, splenius, UT, paraspinals, rhomboids (L>R)  CERVICAL ROM:   Active ROM A/PROM (deg) eval  Flexion 50  Extension 15  Right lateral flexion 20  Left lateral flexion 20  Right rotation 45  Left rotation 35   (Blank rows = not tested)  UPPER EXTREMITY ROM:  Active ROM Right eval Left eval  Shoulder flexion WFL WFL - discomfort in L thumb  Shoulder extension    Shoulder abduction WFL WFL - burning in forearm  Shoulder adduction    Shoulder extension    Shoulder internal rotation    Shoulder external rotation    Elbow flexion    Elbow extension    Wrist flexion    Wrist extension    Wrist ulnar deviation    Wrist radial deviation    Wrist pronation    Wrist supination     (Blank rows = not tested)  UPPER EXTREMITY MMT:  MMT Right eval Left eval  Shoulder flexion 5 4  Shoulder extension    Shoulder abduction 5 5  Shoulder adduction    Shoulder extension    Shoulder internal rotation    Shoulder external rotation    Middle trapezius    Lower trapezius    Elbow flexion 5 5  Elbow extension 5 5  Wrist flexion    Wrist extension    Wrist ulnar deviation    Wrist radial deviation    Wrist pronation     Wrist supination    Grip strength WFL WFL   (Blank rows = not tested)  CERVICAL SPECIAL TESTS:  Upper limb tension test (ULTT): Positive (for median nerve)   TODAY'S TREATMENT:                                                                                                                               PT Evaluation   PATIENT EDUCATION:  Education details: Eval findings, PT POC Person educated: Patient Education method: Explanation Education comprehension: verbalized understanding and needs further education  HOME EXERCISE PROGRAM: To be initiated  ASSESSMENT:  CLINICAL IMPRESSION: Patient  is a 72 year old female referred to Neuro OPPT for neck pain and hand parasthesia.   Pt's PMH is significant for: insulin-dependent type 2 diabetes, hypertension, Nash cirrhosi. The following deficits were present during the exam: decreased cervical ROM, decreased shoulder strength, increased nerve symptoms in L hand. Pt would benefit from skilled PT to address these impairments and functional limitations to maximize functional mobility independence.   OBJECTIVE IMPAIRMENTS: decreased knowledge of condition, decreased ROM, decreased strength, increased fascial restrictions, impaired sensation, impaired UE functional use, and postural dysfunction.   ACTIVITY LIMITATIONS: reach over head  PARTICIPATION LIMITATIONS: driving, community activity, and occupation  PERSONAL FACTORS: Age, Time since onset of injury/illness/exacerbation, and 1-2 comorbidities:     insulin-dependent type 2 diabetes, hypertension, Elita Boone cirrhosiare also affecting patient's functional outcome.   REHAB POTENTIAL: Good  CLINICAL DECISION MAKING: Stable/uncomplicated  EVALUATION COMPLEXITY: Low   GOALS: Goals reviewed with patient? Yes  SHORT TERM GOALS=LONG TERM GOALS due to length of POC   LONG TERM GOALS: Target date: 06/21/2023  Pt will be independent with final HEP for improved cervical ROM, improved posture  and management of pain symptoms in order to build upon functional gains made in therapy. Baseline: not initiated at eval Goal status: INITIAL  2.  Pt will increase lateral cervical flexion by >/= 10 degrees to demonstrate improved function  Baseline:  Right lateral flexion 20  Left lateral flexion 20   Goal status: INITIAL  3.  Pt will increase cervical rotation by >/= 10 degrees to demonstrate improved function Baseline:  Right rotation 45  Left rotation 35   Goal status: INITIAL     PLAN:  PT FREQUENCY: 1x/week  PT DURATION: 4 weeks  PLANNED INTERVENTIONS: Therapeutic exercises, Therapeutic activity, Neuromuscular re-education, Balance training, Gait training, Patient/Family education, Self Care, Joint mobilization, Dry Needling, Electrical stimulation, Spinal manipulation, Spinal mobilization, Cryotherapy, Moist heat, Taping, Manual therapy, and Re-evaluation  PLAN FOR NEXT SESSION: median nerve glides, TPDN, initiate HEP for cervical stretching, strengthening, postural retraining (thoracic mobilization, UT and levator scap stretches, cervical rotation with towel, suboccipital release - chirp wheel)    Peter Congo, PT, DPT, [facility] [address] [place], [place]  [address] Phone:  [phone] Fax:  [phone]  05/24/2023, 10:51 AM

## 2023-05-24 NOTE — Progress Notes (Signed)
  Physician Documentation  Your signature is required to indicate approval of the treatment plan as stated above. By signing this report, you are approving the plan of care. Please sign and either send electronically or print and fax the signed copy to the number below. If you approve with modifications, please indicate those in the space provided.__ Physician Signature: Joaquim Nam, MD ____ Date:__09/11/24 _ Time:__3:49 PM___

## 2023-05-26 ENCOUNTER — Encounter: Payer: Self-pay | Admitting: Family Medicine

## 2023-05-26 ENCOUNTER — Ambulatory Visit (INDEPENDENT_AMBULATORY_CARE_PROVIDER_SITE_OTHER): Payer: Medicare Other | Admitting: Family Medicine

## 2023-05-26 VITALS — BP 122/78 | HR 67 | Temp 97.2°F | Ht 66.0 in | Wt 182.0 lb

## 2023-05-26 DIAGNOSIS — Z1211 Encounter for screening for malignant neoplasm of colon: Secondary | ICD-10-CM | POA: Diagnosis not present

## 2023-05-26 DIAGNOSIS — M5430 Sciatica, unspecified side: Secondary | ICD-10-CM

## 2023-05-26 DIAGNOSIS — E119 Type 2 diabetes mellitus without complications: Secondary | ICD-10-CM

## 2023-05-26 DIAGNOSIS — R1011 Right upper quadrant pain: Secondary | ICD-10-CM | POA: Diagnosis not present

## 2023-05-26 MED ORDER — METFORMIN HCL ER 500 MG PO TB24: 500.0000 mg | ORAL_TABLET | Freq: Two times a day (BID) | ORAL | 3 refills | Status: DC

## 2023-05-26 NOTE — Patient Instructions (Addendum)
If you have more pain then update us/seek care.  Limit fatty foods.   You should get a call from [facility].  You can call them in the meantime.  Defer labs today.  XR metformin rx sent.  Take care.  Glad to see you.  Yearly visit December or January, labs ahead of time if possible.  Use the lower back exercises.

## 2023-05-26 NOTE — Progress Notes (Unsigned)
Inpatient f/u.    Admit date: 05/14/2023 Discharge date: 05/16/2023 Admitted From: Home Disposition: Home Recommendations for Outpatient Follow-up:  Follow up with PCP in 1 to 2 weeks Outpatient follow-up with GI as previously planned or sooner if needed Check CBC and CMP at follow-up   Hospital course 72 year old F with PMH of 9 cirrhosis followed by GI at [facility], IDDM-2, OSA on CPAP, HTN anxiety and depression presented with acute on chronic RUQ pain after eating dinner the night before presentation.  She had associated nausea and bloating.  Basic labs including CBC and CMP without significant finding.  Lipase within normal.  CT without acute finding.  RUQ ultrasound with distended gallbladder, sludge and 7 mm common bile duct.  GI consulted and recommended MRCP   The next day, patient's symptoms basically resolved.  MRCP without significant finding.  She tolerated diet.  Evaluated by [facility] and cleared for discharge.  Recommended trial of p.o. Protonix 40 mg daily.  She was on omeprazole 20 mg daily before. ========================= MRCP IMPRESSION: Trace pericholecystic fluid, which is nonspecific. No other radiographic signs of acute cholecystitis.   Mild dilatation of common bile duct measuring 8 mm, without evidence of choledocholithiasis or other obstructing etiology.  ========================= FH gallbladder pathology.  She had sig RUQ pain that led to ER presentation.  She spontaneously improved with the presumption that she passed sludge.    Her grandson is in counseling, d/w pt.    D/w pt about changing to metformin XR. Rx sent.  She has been ~150.    Has tolerated protonix 40mg  daily.    No FCNAVD.  No RUQ abd pain.  Discussed deferring labs given her status now.    She talked with [facility] about follow up colonoscopy, to be done locally.    L sided sciatica over the last few weeks.  Pain radiates from the L buttock down to the L foot.  Had been exercising at [facility]     Meds, vitals, and allergies reviewed.   ROS: Per HPI unless specifically indicated in ROS section   L SLR neg.   S/S BLE wnl.

## 2023-05-28 DIAGNOSIS — M5430 Sciatica, unspecified side: Secondary | ICD-10-CM | POA: Insufficient documentation

## 2023-05-28 NOTE — Assessment & Plan Note (Signed)
With previous spontaneous improvement attributed to the presumption of sludge passage.  Discussed. Abdomen not tender.  If more pain then update us/seek care.  Limit fatty foods.   Refer to [facility]. Defer labs today.

## 2023-05-28 NOTE — Assessment & Plan Note (Signed)
Discussed home exercise program.  Handout given.  Update me as needed.  She agrees to plan.

## 2023-05-28 NOTE — Assessment & Plan Note (Signed)
Sugars been controlled recently on home check. XR metformin rx sent.  Yearly visit December or January, labs ahead of time if possible.

## 2023-05-29 ENCOUNTER — Encounter: Payer: Self-pay | Admitting: *Deleted

## 2023-05-29 DIAGNOSIS — K080 Exfoliation of teeth due to systemic causes: Secondary | ICD-10-CM | POA: Diagnosis not present

## 2023-05-30 ENCOUNTER — Ambulatory Visit: Payer: Medicare Other | Admitting: Physical Therapy

## 2023-06-06 ENCOUNTER — Ambulatory Visit: Payer: Medicare Other | Admitting: Physical Therapy

## 2023-06-06 ENCOUNTER — Other Ambulatory Visit: Payer: Medicare Other | Admitting: Pharmacist

## 2023-06-06 DIAGNOSIS — M5412 Radiculopathy, cervical region: Secondary | ICD-10-CM

## 2023-06-06 DIAGNOSIS — M542 Cervicalgia: Secondary | ICD-10-CM

## 2023-06-06 DIAGNOSIS — M6281 Muscle weakness (generalized): Secondary | ICD-10-CM | POA: Diagnosis not present

## 2023-06-06 DIAGNOSIS — R202 Paresthesia of skin: Secondary | ICD-10-CM | POA: Diagnosis not present

## 2023-06-06 NOTE — Therapy (Signed)
OUTPATIENT PHYSICAL THERAPY CERVICAL TREATMENT   Patient Name: Allison Yoder MRN: [id number] DOB:1951-03-28, 72 y.o., female Today's Date: 06/06/2023  END OF SESSION:  PT End of Session - 06/06/23 5784     Visit Number 2    Number of Visits 5   with eval   Date for PT Re-Evaluation 07/05/23    Authorization Type BCBS Medicare    PT Start Time [phone]   pt arrived late   PT Stop Time 1016    PT Time Calculation (min) 39 min    Activity Tolerance Patient tolerated treatment well    Behavior During Therapy WFL for tasks assessed/performed              Past Medical History:  Diagnosis Date   Allergy    Anemia    "all the time as a child"   Anxiety    Arthritis    "all my joints; worse in my hands" (10/27/2015)   Chronic lower back pain    Depression    Diverticulosis    Fatty liver    GERD (gastroesophageal reflux disease)    Grade I diastolic dysfunction    Heart murmur    "dx'd years ago; very mild; never treated" (10/27/2015)   Hyperlipidemia    Hypertension    Hypothyroidism 2002-~ 2010   Iritis    per [facility]   Migraines    "stopped when I went thru menopause"   NASH (nonalcoholic steatohepatitis)    OSA on CPAP    Pneumonia ~ 2013; 10/27/2015   Recurrent urinary tract infection    Routine general medical examination at a health care facility 02/26/2014   Snores    Stroke (HCC) 2017   TIAs x2.  No deficits   Type II diabetes mellitus (HCC)    "lost weight; started exercising again; don't have it anymore" (10/27/2015)   Urinary incontinence    Wears glasses    Wears hearing aid in both ears    Past Surgical History:  Procedure Laterality Date   BLEPHAROPLASTY Bilateral 2013; 2016   BREAST LUMPECTOMY Right 1964   benign tumor,   CATARACT EXTRACTION W/PHACO Right 01/31/2023   Procedure: CATARACT EXTRACTION PHACO AND INTRAOCULAR LENS PLACEMENT (IOC) RIGHT DIABETIC  5.04  00:36.2;  Surgeon: Galen Manila, MD;  Location: [facility];   Service: Ophthalmology;  Laterality: Right;  Diabetic   CATARACT EXTRACTION W/PHACO Left 02/14/2023   Procedure: CATARACT EXTRACTION PHACO AND INTRAOCULAR LENS PLACEMENT (IOC) LEFT DIABETIC  5.73  00:40.1;  Surgeon: Galen Manila, MD;  Location: [facility];  Service: Ophthalmology;  Laterality: Left;  Diabetic   HEMORRHOID BANDING  X 2   KNEE ARTHROSCOPY Left 2016   "meniscus repair"   PELVIC FLOOR REPAIR  2001   "lift"   POLYPECTOMY  X 3   "bladder"   SHOULDER ARTHROSCOPY W/ ROTATOR CUFF REPAIR Left 2013   SHOULDER ARTHROSCOPY W/ ROTATOR CUFF REPAIR Right 2015   TUBAL LIGATION  ~ 1986   VAGINAL HYSTERECTOMY  1992   Partial   Patient Active Problem List   Diagnosis Date Noted   Sciatica 05/28/2023   RUQ abdominal pain 05/15/2023   Hand paresthesia 04/30/2023   Urine abnormality 11/19/2022   BPV (benign positional vertigo) 11/19/2022   Sore throat 10/12/2022   Acute non-recurrent frontal sinusitis 09/26/2022   Drug-induced myopathy 02/18/2022   Acute nonsuppurative otitis media of right ear 01/19/2022   Wheezing 01/19/2022   Abnormal liver function 11/16/2021   Diverticulitis  of colon 11/16/2021   Hematochezia 11/16/2021   Deviated septum 11/09/2021   Urinary frequency 06/09/2021   SOBOE (shortness of breath on exertion) 06/09/2021   Facial droop 05/20/2021   Dizziness 05/20/2021   LFT elevation 03/28/2021   Right hip pain 01/31/2021   Other social stressor 12/13/2020   Dysphagia 07/22/2020   Abnormal cervical Papanicolaou smear 04/14/2020   Healthcare maintenance 07/22/2019   Cough 07/22/2019   Arthralgia 07/22/2019   Medicare annual wellness visit, initial 06/10/2018   Chest heaviness 06/10/2018   Osteopenia 09/10/2017   Fatty liver 05/19/2017   Diabetes mellitus without complication (HCC) 05/05/2017   History of cerebrovascular accident (CVA) due to ischemia 04/08/2016   Hypertensive disorder 10/27/2015   Advance care planning 04/29/2015   Obstructive  sleep apnea of adult 07/25/2014   Palpitations 01/06/2013   Anxiety 09/01/2012   Shoulder pain 12/19/2011   MDD (major depressive disorder) 12/19/2011   OA (osteoarthritis) 12/19/2011   Hyperlipidemia associated with type 2 diabetes mellitus (HCC) 12/19/2011   Diverticulitis 12/19/2011   Migraines 12/19/2011   Hypothyroid 12/19/2011   Iritis 12/19/2011    PCP: Joaquim Nam, MD  REFERRING PROVIDER: Joaquim Nam, MD  REFERRING DIAG: M54.2 (ICD-10-CM) - Neck pain R20.2 (ICD-10-CM) - Hand paresthesia  THERAPY DIAG:  Muscle weakness (generalized)  Radiculopathy, cervical region  Cervicalgia  Rationale for Evaluation and Treatment: Rehabilitation  ONSET DATE: 04/28/2023 (referral date)  SUBJECTIVE:                                                                                                                                                                                                         SUBJECTIVE STATEMENT: Pt reports that her symptoms have been doing the same since initial eval, still has numbness in index finger of L hand. Pt reports ongoing pain in her neck and between her shoulder blades, see pain below. Pt also reports she is having headaches, every other day or so, they come and go, she can move her head different directions and the pain will improve.  Pt reports having more issues with her R shoulder/RTC today as well, history of surgery to both RTCs.   Hand dominance: Right  PERTINENT HISTORY:  PMH: insulin-dependent type 2 diabetes, hypertension, Nash cirrhosis  PAIN:  Are you having pain? Yes: NPRS scale: 3/10 Pain location: neck and down between shoulder blades Pain description: soreness Aggravating factors: moving my head Relieving factors: heated neck massager  PRECAUTIONS: None  RED FLAGS: None    WEIGHT BEARING RESTRICTIONS: No  FALLS:  Has patient fallen  in last 6 months? Yes. Number of falls one fall, landed on her bottom  LIVING  ENVIRONMENT: Lives with: lives with their family (takes care of her 44 yo grandson) Lives in: House/apartment  OCCUPATION: still works full time -- Hospital doctor  PLOF: Independent with gait and Independent with transfers  PATIENT GOALS: "I want my finger back"  NEXT MD VISIT: sees Dr. Para March this Friday morning (9/13)  OBJECTIVE:   DIAGNOSTIC FINDINGS:  Plan to try PT before any imaginig will be completed  COGNITION: Overall cognitive status: Within functional limits for tasks assessed  SENSATION: Light touch intact except in left index finger  POSTURE: rounded shoulders and forward head  PALPATION: Tightness in suboccipitals, splenius, UT, paraspinals, rhomboids (L>R)  CERVICAL ROM:   Active ROM A/PROM (deg) eval  Flexion 50  Extension 15  Right lateral flexion 20  Left lateral flexion 20  Right rotation 45  Left rotation 35   (Blank rows = not tested)  UPPER EXTREMITY ROM:  Active ROM Right eval Left eval  Shoulder flexion WFL WFL - discomfort in L thumb  Shoulder extension    Shoulder abduction WFL WFL - burning in forearm  Shoulder adduction    Shoulder extension    Shoulder internal rotation    Shoulder external rotation    Elbow flexion    Elbow extension    Wrist flexion    Wrist extension    Wrist ulnar deviation    Wrist radial deviation    Wrist pronation    Wrist supination     (Blank rows = not tested)  UPPER EXTREMITY MMT:  MMT Right eval Left eval  Shoulder flexion 5 4  Shoulder extension    Shoulder abduction 5 5  Shoulder adduction    Shoulder extension    Shoulder internal rotation    Shoulder external rotation    Middle trapezius    Lower trapezius    Elbow flexion 5 5  Elbow extension 5 5  Wrist flexion    Wrist extension    Wrist ulnar deviation    Wrist radial deviation    Wrist pronation    Wrist supination    Grip strength WFL WFL   (Blank rows = not tested)  CERVICAL SPECIAL TESTS:  Upper limb tension test  (ULTT): Positive (for median nerve)   TODAY'S TREATMENT:                                                                                                                              TherEx Seated L median nerve glide x 5 reps Seated B UT stretch 3 x 30 sec each B Seated B levator scap stretch 3 x 30 sec each B  Added to HEP, see bolded below  Manual Therapy Suboccipital release 4 x 30 sec each Cervical distraction 4 x 30 sec each Relief of symptoms down LUE "Opening up of sinuses"  Demonstrated how to perform suboccipital release independently with  use of equipment: Suboccipital release with tennis balls x 5 min Chirp Wheel XR suboccipital release x 5 min  Provided handout for where to purchase Chirp Wheel and how to place tennis balls for suboccipital release.  Pt reports that her neck is feeling better at end of session.    PATIENT EDUCATION:  Education details: initiated HEP, Chirp wheel and suboccipital release Person educated: Patient Education method: Explanation, Demonstration, Tactile cues, Verbal cues, and Handouts Education comprehension: verbalized understanding, returned demonstration, and needs further education  HOME EXERCISE PROGRAM: Access Code: [email or web address] URL: [email or web address] Date: 06/06/2023 Prepared by: Peter Congo  Exercises - Median Nerve Flossing - Tray  - 1 x daily - 5 x weekly - 1 sets - 5 reps - Seated Upper Trapezius Stretch  - 1 x daily - 5 x weekly - 1 sets - 3-5 reps - 30 sec hold - Gentle Levator Scapulae Stretch  - 1 x daily - 5 x weekly - 3 sets - 3-5 reps - Supine Suboccipital Release with Tennis Balls  - 1 x daily - 7 x weekly - 1 sets - 1 reps - 5-10 min hold  ASSESSMENT:  CLINICAL IMPRESSION: Emphasis of skilled PT session on initiating HEP to address cervical radiculopathy as well as to address neck and shoulder pain. Pt initially with discomfort with stretching but by end of session has some relief of her pain  and feels better overall. Pt with good relief of symptoms also with manual therapy performed this date, can benefit from addition of cervical distraction to HEP next session as well as initiation of TPDN, educated her on DN this session. Pt continues to benefit from skilled therapy services to work towards increased independence with management of her pain symptoms. Continue POC.   OBJECTIVE IMPAIRMENTS: decreased knowledge of condition, decreased ROM, decreased strength, increased fascial restrictions, impaired sensation, impaired UE functional use, and postural dysfunction.   ACTIVITY LIMITATIONS: reach over head  PARTICIPATION LIMITATIONS: driving, community activity, and occupation  PERSONAL FACTORS: Age, Time since onset of injury/illness/exacerbation, and 1-2 comorbidities:     insulin-dependent type 2 diabetes, hypertension, Elita Boone cirrhosiare also affecting patient's functional outcome.   REHAB POTENTIAL: Good  CLINICAL DECISION MAKING: Stable/uncomplicated  EVALUATION COMPLEXITY: Low   GOALS: Goals reviewed with patient? Yes  SHORT TERM GOALS=LONG TERM GOALS due to length of POC   LONG TERM GOALS: Target date: 06/21/2023  Pt will be independent with final HEP for improved cervical ROM, improved posture and management of pain symptoms in order to build upon functional gains made in therapy. Baseline: not initiated at eval Goal status: INITIAL  2.  Pt will increase lateral cervical flexion by >/= 10 degrees to demonstrate improved function  Baseline:  Right lateral flexion 20  Left lateral flexion 20   Goal status: INITIAL  3.  Pt will increase cervical rotation by >/= 10 degrees to demonstrate improved function Baseline:  Right rotation 45  Left rotation 35   Goal status: INITIAL     PLAN:  PT FREQUENCY: 1x/week  PT DURATION: 4 weeks  PLANNED INTERVENTIONS: Therapeutic exercises, Therapeutic activity, Neuromuscular re-education, Balance training, Gait  training, Patient/Family education, Self Care, Joint mobilization, Dry Needling, Electrical stimulation, Spinal manipulation, Spinal mobilization, Cryotherapy, Moist heat, Taping, Manual therapy, and Re-evaluation  PLAN FOR NEXT SESSION: TPDN to suboccipitals, splenius, UT, levator, how is initial HEP? Add to HEP PRN for cervical stretching, strengthening, postural retraining (thoracic mobilization, cervical rotation with towel, cervical distraction)    Peter Congo, PT, DPT,  [facility] [address] [place], [place]  [address] Phone:  [phone] Fax:  [phone]  06/06/2023, 10:17 AM

## 2023-06-07 ENCOUNTER — Other Ambulatory Visit: Payer: Medicare Other | Admitting: Pharmacist

## 2023-06-07 ENCOUNTER — Telehealth: Payer: Self-pay | Admitting: Pharmacist

## 2023-06-07 MED ORDER — TOUJEO SOLOSTAR 300 UNIT/ML ~~LOC~~ SOPN: 50.0000 [IU] | PEN_INJECTOR | Freq: Every day | SUBCUTANEOUS | Status: DC

## 2023-06-07 NOTE — Telephone Encounter (Signed)
Called Ms Vice to follow up regarding her request for Lantus refill through [employer] MAP program.   With her increased Lantus dose of 50 units daily, a 90 day supply would be 30 insulin pens. We discussed if she would like to switch to Toujeo (the same exact insulin, concentrated) to reduce size of shipment/qty of pens delivered.   She is agreeable. Will discuss with PCP.   She also asks about possibly getting a CGM. Added patient to pharmacy schedule tomorrow, 10 am to review CGM coverage criteria/CGM setup and education.    Summary of Plan: Will fax medication refill pending provider signature to [employer] MAP program  Toujeo (insulin glargine) 300 U/mL; Take 50 units once daily (9 pens = 81 day supply) Replaces Lantus (insulin glargine) 100 U/mL; Take 50 units once daily   Ordering Note: *Toujeo SoloStar is packaged as three 1.46mL pens per pack 450 units/pen; Toujeo Max SoloStar is packaged as two 3mL pens per pack 900 units/pen  Loree Fee, PharmD Clinical Pharmacist [facility] [phone]

## 2023-06-07 NOTE — Progress Notes (Signed)
06/07/2023 Name: Allison Yoder MRN: [id number] DOB: 08/08/1951  Subjective  No chief complaint on file.   Reason for visit: Allison Yoder is a 72 y.o. year old female who presented for a telephone visit.   They were referred to the pharmacist by their PCP for assistance in managing medication access related to insulin. Taking 50 units insulin.    Care Team: Primary Care Provider: Joaquim Nam, MD  Reason for visit: ?  Allison Yoder is a 72 y.o. female with a history of diabetes (type 2), who presents today for a phone visit with the pharmacist due to medication access concerns. ?   Medication Access: ?  Prescription drug coverage: Payor: BLUE CROSS BLUE SHIELD MEDICARE / Plan: BCBS MEDICARE / Product Type: *No Product type* / .   Reports that all medications are not affordable.   Current Patient Assistance: Merck: Januvia Sanofi: Lantus  Medication Adherence:  Repatha ($45 per month at [employer])  Assessment and Plan:   1. Medication Access: Patient receives Lantus through [employer] MAP. With recent dose change, updated prescription is required as to prevent "refill too soon" issue. She also asks if there is a MAP for Repatha. She is paying $45/month currently which is doable for her though is putting strain on her given recent changes in work hours. Refill for Lantus 50 units daily sent to [employer] MAP  Patient is not eligible for copay cards due to government insurance. Will collaborate with [facility] team to facilitate MAP assistance for Repatha via [employer]   Future Appointments  Date Time Provider Department Center  06/07/2023 11:00 AM [facility] CCM PHARMACIST [facility]  06/13/2023  2:45 PM Peter Congo, PT [facility]  06/21/2023  2:00 PM Peter Congo, PT [facility]  09/15/2023  8:00 AM [facility] LAB [facility]  09/22/2023  8:30 AM Joaquim Nam, MD [facility]   Loree Fee, PharmD Clinical Pharmacist [facility] [phone]

## 2023-06-07 NOTE — Patient Instructions (Signed)
[name],   It was a pleasure to see you today! As we discussed:?  We will send a refill for Lantus to the [employer] patient assistance program to reflect updated instructions.  We will reach out to our wonderful pharmacy team to see if they are able to assist with applying for a program to help with the cost of Repatha.   Please reach out if you have any further questions!   Berenice Primas, PharmD - Clinical Pharmacist

## 2023-06-07 NOTE — Telephone Encounter (Signed)
Agree. Thanks

## 2023-06-08 ENCOUNTER — Encounter: Payer: Self-pay | Admitting: Pharmacist

## 2023-06-08 ENCOUNTER — Telehealth: Payer: Self-pay | Admitting: Pharmacist

## 2023-06-08 ENCOUNTER — Other Ambulatory Visit: Payer: Medicare Other

## 2023-06-08 NOTE — Progress Notes (Signed)
Attempted to contact patient for scheduled appointment for medication management. Patient requested appointment to discuss possibly starting a CGM. Left HIPAA compliant message for patient to return my call at their convenience.   In the meantime: PA Submitted via [facility] for the following: [employer] (Key: [id number]) - Covered Libre 3 Reader (Key: [id number]) - Covered  Order sent via [employer] to DME supplier (order still being reviewed).  If Josephine Igo is affordable, we will plan to set up a CGM training visit.   Loree Fee, PharmD Clinical Pharmacist [facility] [phone]

## 2023-06-08 NOTE — Telephone Encounter (Signed)
Attempted to contact patient for scheduled appointment for medication management. Ms Leckey requested an additional pharmacy visit to discuss the process of receiving a continuous glucose monitor (CGM).   Left HIPAA compliant message for patient to return my call at their convenience.   PA Submitted via [facility] for the following: [employer] (Key: [id number]) Josephine Igo 3 Reader (Key: [id number])   Loree Fee, PharmD Clinical Pharmacist [facility] [phone]

## 2023-06-08 NOTE — Progress Notes (Deleted)
06/08/2023 Name: Allison Yoder MRN: [id number] DOB: [birth year]  Subjective  No chief complaint on file.   Reason for visit: Allison Yoder is a 72 y.o. year old female who presented for a telephone visit.   They were referred to the pharmacist by their PCP for assistance in managing medication access related to insulin. Ms Bartleson requested an additional pharmacy visit to discuss the process of receiving a continuous glucose monitor (CGM).   Care Team: Primary Care Provider: Joaquim Nam, MD  Reason for visit: ?  Allison Yoder is a 72 y.o. female with a history of diabetes (type 2), who presents today for a phone visit with the pharmacist.   Medication Access: ?  Prescription drug coverage: Payor: BLUE CROSS BLUE SHIELD MEDICARE / Plan: BCBS MEDICARE / Product Type: *No Product type* / . Contract#[id number] In-Network DME: Coinsurance for Medicare-covered Durable Medical Equipment 20%; Prior Authorization Required for Durable Medical Equipment  Patient reports that she is interested in a CGM. Concern that cost could be a barrier.    Assessment and Plan:   Diabetes Home Monitoring: PA Submitted via CMM for the following: [employer] (Key: [id number]) Josephine Igo 3 Reader (Key: [id number])   Future Appointments  Date Time Provider Department Center  06/08/2023 10:00 AM [facility] CCM PHARMACIST [facility]  06/13/2023  2:45 PM Peter Congo, PT [facility]  06/21/2023  2:00 PM Peter Congo, PT [facility]  09/15/2023  8:00 AM [facility] LAB [facility]  09/22/2023  8:30 AM Joaquim Nam, MD [facility]   Loree Fee, PharmD Clinical Pharmacist [facility] [phone] Lab Results  Component Value Date   HGBA1C 7.0 (A) 04/28/2023

## 2023-06-12 ENCOUNTER — Encounter: Payer: Self-pay | Admitting: Pharmacist

## 2023-06-13 ENCOUNTER — Ambulatory Visit: Payer: Medicare Other | Attending: Family Medicine | Admitting: Physical Therapy

## 2023-06-13 ENCOUNTER — Telehealth: Payer: Self-pay

## 2023-06-13 DIAGNOSIS — M5412 Radiculopathy, cervical region: Secondary | ICD-10-CM | POA: Insufficient documentation

## 2023-06-13 DIAGNOSIS — M6281 Muscle weakness (generalized): Secondary | ICD-10-CM | POA: Insufficient documentation

## 2023-06-13 DIAGNOSIS — M542 Cervicalgia: Secondary | ICD-10-CM | POA: Insufficient documentation

## 2023-06-13 NOTE — Telephone Encounter (Signed)
-----   Message from Loree Fee sent at 06/07/2023 11:39 AM EDT ----- Hello! Patient is already enrolled in [employer] and [employer] MAP, so hoping this will be an easy one! Patient would like assistance applying for Repatha Audiological scientist). Dosing for the medication is up to date in her current med list - Thank you!

## 2023-06-13 NOTE — Therapy (Signed)
OUTPATIENT PHYSICAL THERAPY CERVICAL TREATMENT   Patient Name: Allison Yoder MRN: [id number] DOB:Feb 23, 1951, 72 y.o., female Today's Date: 06/13/2023  END OF SESSION:  PT End of Session - 06/13/23 1450     Visit Number 3    Number of Visits 5   with eval   Date for PT Re-Evaluation 07/05/23    Authorization Type [employer] Medicare    PT Start Time 1450    PT Stop Time 1530    PT Time Calculation (min) 40 min    Activity Tolerance Patient tolerated treatment well    Behavior During Therapy WFL for tasks assessed/performed               Past Medical History:  Diagnosis Date   Allergy    Anemia    "all the time as a child"   Anxiety    Arthritis    "all my joints; worse in my hands" (10/27/2015)   Chronic lower back pain    Depression    Diverticulosis    Fatty liver    GERD (gastroesophageal reflux disease)    Grade I diastolic dysfunction    Heart murmur    "dx'd years ago; very mild; never treated" (10/27/2015)   Hyperlipidemia    Hypertension    Hypothyroidism 2002-~ 2010   Iritis    per [facility]   Migraines    "stopped when I went thru menopause"   NASH (nonalcoholic steatohepatitis)    OSA on CPAP    Pneumonia ~ 2013; 10/27/2015   Recurrent urinary tract infection    Routine general medical examination at a health care facility 02/26/2014   Snores    Stroke (HCC) 2017   TIAs x2.  No deficits   Type II diabetes mellitus (HCC)    "lost weight; started exercising again; don't have it anymore" (10/27/2015)   Urinary incontinence    Wears glasses    Wears hearing aid in both ears    Past Surgical History:  Procedure Laterality Date   BLEPHAROPLASTY Bilateral 2013; 2016   BREAST LUMPECTOMY Right 1964   benign tumor,   CATARACT EXTRACTION W/PHACO Right 01/31/2023   Procedure: CATARACT EXTRACTION PHACO AND INTRAOCULAR LENS PLACEMENT (IOC) RIGHT DIABETIC  5.04  00:36.2;  Surgeon: Galen Manila, MD;  Location: [facility];  Service:  Ophthalmology;  Laterality: Right;  Diabetic   CATARACT EXTRACTION W/PHACO Left 02/14/2023   Procedure: CATARACT EXTRACTION PHACO AND INTRAOCULAR LENS PLACEMENT (IOC) LEFT DIABETIC  5.73  00:40.1;  Surgeon: Galen Manila, MD;  Location: [facility];  Service: Ophthalmology;  Laterality: Left;  Diabetic   HEMORRHOID BANDING  X 2   KNEE ARTHROSCOPY Left 2016   "meniscus repair"   PELVIC FLOOR REPAIR  2001   "lift"   POLYPECTOMY  X 3   "bladder"   SHOULDER ARTHROSCOPY W/ ROTATOR CUFF REPAIR Left 2013   SHOULDER ARTHROSCOPY W/ ROTATOR CUFF REPAIR Right 2015   TUBAL LIGATION  ~ 1986   VAGINAL HYSTERECTOMY  1992   Partial   Patient Active Problem List   Diagnosis Date Noted   Sciatica 05/28/2023   RUQ abdominal pain 05/15/2023   Hand paresthesia 04/30/2023   Urine abnormality 11/19/2022   BPV (benign positional vertigo) 11/19/2022   Sore throat 10/12/2022   Acute non-recurrent frontal sinusitis 09/26/2022   Drug-induced myopathy 02/18/2022   Acute nonsuppurative otitis media of right ear 01/19/2022   Wheezing 01/19/2022   Abnormal liver function 11/16/2021   Diverticulitis of colon 11/16/2021  Hematochezia 11/16/2021   Deviated septum 11/09/2021   Urinary frequency 06/09/2021   SOBOE (shortness of breath on exertion) 06/09/2021   Facial droop 05/20/2021   Dizziness 05/20/2021   LFT elevation 03/28/2021   Right hip pain 01/31/2021   Other social stressor 12/13/2020   Dysphagia 07/22/2020   Abnormal cervical Papanicolaou smear 04/14/2020   Healthcare maintenance 07/22/2019   Cough 07/22/2019   Arthralgia 07/22/2019   Medicare annual wellness visit, initial 06/10/2018   Chest heaviness 06/10/2018   Osteopenia 09/10/2017   Fatty liver 05/19/2017   Diabetes mellitus without complication (HCC) 05/05/2017   History of cerebrovascular accident (CVA) due to ischemia 04/08/2016   Hypertensive disorder 10/27/2015   Advance care planning 04/29/2015   Obstructive sleep  apnea of adult 07/25/2014   Palpitations 01/06/2013   Anxiety 09/01/2012   Shoulder pain 12/19/2011   MDD (major depressive disorder) 12/19/2011   OA (osteoarthritis) 12/19/2011   Hyperlipidemia associated with type 2 diabetes mellitus (HCC) 12/19/2011   Diverticulitis 12/19/2011   Migraines 12/19/2011   Hypothyroid 12/19/2011   Iritis 12/19/2011    PCP: Joaquim Nam, MD  REFERRING PROVIDER: Joaquim Nam, MD  REFERRING DIAG: M54.2 (ICD-10-CM) - Neck pain R20.2 (ICD-10-CM) - Hand paresthesia  THERAPY DIAG:  Muscle weakness (generalized)  Radiculopathy, cervical region  Cervicalgia  Rationale for Evaluation and Treatment: Rehabilitation  ONSET DATE: 04/28/2023 (referral date)  SUBJECTIVE:                                                                                                                                                                                                         SUBJECTIVE STATEMENT: Pt has been doing her HEP, working on stretches. Pt feeling very stiff today, had trouble sleeping last night and had to take Flexeril to be able to get to sleep. Pt having some sciatica pain down on her L side today. Pt feels like the sensation changes in her finger of L hand have not changed. Pt reports she is having intermittent pain in her neck and her head, head feels like in a vice, "pressure". Pt did dismantle a queen size bed by herself the other day and believes this may have contributed to her pain/stiffness.  Hand dominance: Right  PERTINENT HISTORY:  PMH: insulin-dependent type 2 diabetes, hypertension, Nash cirrhosis  PAIN:  Are you having pain? Yes: NPRS scale: 3/10 Pain location: neck and down between shoulder blades Pain description: soreness Aggravating factors: moving my head Relieving factors: heated neck massager  PRECAUTIONS: None  RED FLAGS: None    WEIGHT BEARING  RESTRICTIONS: No  FALLS:  Has patient fallen in last 6 months? Yes.  Number of falls one fall, landed on her bottom  LIVING ENVIRONMENT: Lives with: lives with their family (takes care of her 49 yo grandson) Lives in: House/apartment  OCCUPATION: still works full time -- Hospital doctor  PLOF: Independent with gait and Independent with transfers  PATIENT GOALS: "I want my finger back"  NEXT MD VISIT: sees Dr. Para March this Friday morning (9/13)  OBJECTIVE:   DIAGNOSTIC FINDINGS:  Plan to try PT before any imaginig will be completed  COGNITION: Overall cognitive status: Within functional limits for tasks assessed  SENSATION: Light touch intact except in left index finger  POSTURE: rounded shoulders and forward head  PALPATION: Tightness in suboccipitals, splenius, UT, paraspinals, rhomboids (L>R)  CERVICAL ROM:   Active ROM A/PROM (deg) eval  Flexion 50  Extension 15  Right lateral flexion 20  Left lateral flexion 20  Right rotation 45  Left rotation 35   (Blank rows = not tested)  UPPER EXTREMITY ROM:  Active ROM Right eval Left eval  Shoulder flexion WFL WFL - discomfort in L thumb  Shoulder extension    Shoulder abduction WFL WFL - burning in forearm  Shoulder adduction    Shoulder extension    Shoulder internal rotation    Shoulder external rotation    Elbow flexion    Elbow extension    Wrist flexion    Wrist extension    Wrist ulnar deviation    Wrist radial deviation    Wrist pronation    Wrist supination     (Blank rows = not tested)  UPPER EXTREMITY MMT:  MMT Right eval Left eval  Shoulder flexion 5 4  Shoulder extension    Shoulder abduction 5 5  Shoulder adduction    Shoulder extension    Shoulder internal rotation    Shoulder external rotation    Middle trapezius    Lower trapezius    Elbow flexion 5 5  Elbow extension 5 5  Wrist flexion    Wrist extension    Wrist ulnar deviation    Wrist radial deviation    Wrist pronation    Wrist supination    Grip strength WFL WFL   (Blank rows = not  tested)  CERVICAL SPECIAL TESTS:  Upper limb tension test (ULTT): Positive (for median nerve)   TODAY'S TREATMENT:                                                                                                                               TherAct Trigger Point Dry-Needling  Treatment instructions: Expect mild to moderate muscle soreness. S/S of pneumothorax if dry needled over a lung field, and to seek immediate medical attention should they occur. Patient verbalized understanding of these instructions and education.  Patient Consent Given: Yes Education handout provided: Previously provided Muscles treated: L and R suboccipitals, L and R splenius capitus, L and  R upper traps Treatment response/outcome: deep ache/pressure; muscle twitch detected     PATIENT EDUCATION:  Education details: TPDN, continue with HEP, PT POC with added visits Person educated: Patient Education method: Explanation Education comprehension: verbalized understanding  HOME EXERCISE PROGRAM: Access Code: [email or web address] URL: [email or web address] Date: 06/06/2023 Prepared by: Peter Congo  Exercises - Median Nerve Flossing - Tray  - 1 x daily - 5 x weekly - 1 sets - 5 reps - Seated Upper Trapezius Stretch  - 1 x daily - 5 x weekly - 1 sets - 3-5 reps - 30 sec hold - Gentle Levator Scapulae Stretch  - 1 x daily - 5 x weekly - 3 sets - 3-5 reps - Supine Suboccipital Release with Tennis Balls  - 1 x daily - 7 x weekly - 1 sets - 1 reps - 5-10 min hold   ASSESSMENT:  CLINICAL IMPRESSION: Emphasis of skilled PT session on initiating TPDN to address tightness in cervical and upper shoulder muscles. Will assess response to DN next session. Pt continues to benefit from skilled therapy services to address ongoing neck pain and tightness leading to impaired function. Continue POC.   OBJECTIVE IMPAIRMENTS: decreased knowledge of condition, decreased ROM, decreased strength, increased fascial  restrictions, impaired sensation, impaired UE functional use, and postural dysfunction.   ACTIVITY LIMITATIONS: reach over head  PARTICIPATION LIMITATIONS: driving, community activity, and occupation  PERSONAL FACTORS: Age, Time since onset of injury/illness/exacerbation, and 1-2 comorbidities:     insulin-dependent type 2 diabetes, hypertension, [place]ita [place] cirrhosiare also affecting patient's functional outcome.   REHAB POTENTIAL: Good  CLINICAL DECISION MAKING: Stable/uncomplicated  EVALUATION COMPLEXITY: Low   GOALS: Goals reviewed with patient? Yes  SHORT TERM GOALS=LONG TERM GOALS due to length of POC   LONG TERM GOALS: Target date: 06/21/2023  Pt will be independent with final HEP for improved cervical ROM, improved posture and management of pain symptoms in order to build upon functional gains made in therapy. Baseline: not initiated at eval Goal status: INITIAL  2.  Pt will increase lateral cervical flexion by >/= 10 degrees to demonstrate improved function  Baseline:  Right lateral flexion 20  Left lateral flexion 20   Goal status: INITIAL  3.  Pt will increase cervical rotation by >/= 10 degrees to demonstrate improved function Baseline:  Right rotation 45  Left rotation 35   Goal status: INITIAL     PLAN:  PT FREQUENCY: 1x/week  PT DURATION: 4 weeks  PLANNED INTERVENTIONS: Therapeutic exercises, Therapeutic activity, Neuromuscular re-education, Balance training, Gait training, Patient/Family education, Self Care, Joint mobilization, Dry Needling, Electrical stimulation, Spinal manipulation, Spinal mobilization, Cryotherapy, Moist heat, Taping, Manual therapy, and Re-evaluation  PLAN FOR NEXT SESSION: how as response to DN? TPDN to suboccipitals, splenius, UT, levator, how is initial HEP? Add to HEP PRN for cervical stretching, strengthening, postural retraining (thoracic mobilization, cervical rotation with towel, cervical distraction)    Peter Congo,  PT, DPT, [facility] [address] [place], [place]  [address] Phone:  [phone] Fax:  [phone]  06/13/2023, 3:31 PM

## 2023-06-13 NOTE — Telephone Encounter (Signed)
PAP: PAP application for [facility], [facility]) has been mailed to pt's home address on file. Will fax provider portion of application to provider's office when pt's portion is received.    PLEASE BE ADVISED

## 2023-06-16 ENCOUNTER — Telehealth: Payer: Self-pay

## 2023-06-16 NOTE — Telephone Encounter (Signed)
Called Patient let her know that we have received her Lantus, (patient supply). She will come by nest week and pick up.

## 2023-06-21 ENCOUNTER — Ambulatory Visit: Payer: Medicare Other | Admitting: Physical Therapy

## 2023-06-22 ENCOUNTER — Telehealth: Payer: Self-pay | Admitting: Family Medicine

## 2023-06-22 DIAGNOSIS — Z1231 Encounter for screening mammogram for malignant neoplasm of breast: Secondary | ICD-10-CM | POA: Diagnosis not present

## 2023-06-22 DIAGNOSIS — Z6829 Body mass index (BMI) 29.0-29.9, adult: Secondary | ICD-10-CM | POA: Diagnosis not present

## 2023-06-22 DIAGNOSIS — Z01419 Encounter for gynecological examination (general) (routine) without abnormal findings: Secondary | ICD-10-CM | POA: Diagnosis not present

## 2023-06-22 NOTE — Telephone Encounter (Signed)
[facility] called in stating that they received rx for glucose monitor, however they are needing the recent office notes.

## 2023-06-23 NOTE — Telephone Encounter (Signed)
Closing encounter. Spoke with patient who advised that she did not request anything from [facility].

## 2023-06-24 DIAGNOSIS — Z794 Long term (current) use of insulin: Secondary | ICD-10-CM | POA: Diagnosis not present

## 2023-06-24 DIAGNOSIS — E1165 Type 2 diabetes mellitus with hyperglycemia: Secondary | ICD-10-CM | POA: Diagnosis not present

## 2023-06-26 ENCOUNTER — Ambulatory Visit: Payer: Medicare Other | Admitting: Physical Therapy

## 2023-06-26 DIAGNOSIS — M5412 Radiculopathy, cervical region: Secondary | ICD-10-CM | POA: Diagnosis not present

## 2023-06-26 DIAGNOSIS — M6281 Muscle weakness (generalized): Secondary | ICD-10-CM | POA: Diagnosis not present

## 2023-06-26 DIAGNOSIS — M542 Cervicalgia: Secondary | ICD-10-CM | POA: Diagnosis not present

## 2023-06-26 NOTE — Therapy (Signed)
OUTPATIENT PHYSICAL THERAPY CERVICAL TREATMENT   Patient Name: Allison Yoder MRN: [id number] DOB:1951-06-18, 72 y.o., female Today's Date: 06/26/2023  END OF SESSION:  PT End of Session - 06/26/23 0856     Visit Number 4    Number of Visits 5   with eval   Date for PT Re-Evaluation 07/05/23    Authorization Type BCBS Medicare    PT Start Time [phone]   pt checked in late   PT Stop Time 0932    PT Time Calculation (min) 38 min    Activity Tolerance Patient tolerated treatment well    Behavior During Therapy [facility] for tasks assessed/performed                Past Medical History:  Diagnosis Date   Allergy    Anemia    "all the time as a child"   Anxiety    Arthritis    "all my joints; worse in my hands" (10/27/2015)   Chronic lower back pain    Depression    Diverticulosis    Fatty liver    GERD (gastroesophageal reflux disease)    Grade I diastolic dysfunction    Heart murmur    "dx'd years ago; very mild; never treated" (10/27/2015)   Hyperlipidemia    Hypertension    Hypothyroidism 2002-~ 2010   Iritis    per [facility]   Migraines    "stopped when I went thru menopause"   NASH (nonalcoholic steatohepatitis)    OSA on CPAP    Pneumonia ~ 2013; 10/27/2015   Recurrent urinary tract infection    Routine general medical examination at a health care facility 02/26/2014   Snores    Stroke (HCC) 2017   TIAs x2.  No deficits   Type II diabetes mellitus (HCC)    "lost weight; started exercising again; don't have it anymore" (10/27/2015)   Urinary incontinence    Wears glasses    Wears hearing aid in both ears    Past Surgical History:  Procedure Laterality Date   BLEPHAROPLASTY Bilateral 2013; 2016   BREAST LUMPECTOMY Right 1964   benign tumor,   CATARACT EXTRACTION W/PHACO Right 01/31/2023   Procedure: CATARACT EXTRACTION PHACO AND INTRAOCULAR LENS PLACEMENT (IOC) RIGHT DIABETIC  5.04  00:36.2;  Surgeon: Galen Manila, MD;  Location: [facility]  SURGERY [facility];  Service: Ophthalmology;  Laterality: Right;  Diabetic   CATARACT EXTRACTION W/PHACO Left 02/14/2023   Procedure: CATARACT EXTRACTION PHACO AND INTRAOCULAR LENS PLACEMENT (IOC) LEFT DIABETIC  5.73  00:40.1;  Surgeon: Galen Manila, MD;  Location: [facility] SURGERY [facility];  Service: Ophthalmology;  Laterality: Left;  Diabetic   HEMORRHOID BANDING  X 2   KNEE ARTHROSCOPY Left 2016   "meniscus repair"   PELVIC FLOOR REPAIR  2001   "lift"   POLYPECTOMY  X 3   "bladder"   SHOULDER ARTHROSCOPY W/ ROTATOR CUFF REPAIR Left 2013   SHOULDER ARTHROSCOPY W/ ROTATOR CUFF REPAIR Right 2015   TUBAL LIGATION  ~ 1986   VAGINAL HYSTERECTOMY  1992   Partial   Patient Active Problem List   Diagnosis Date Noted   Sciatica 05/28/2023   RUQ abdominal pain 05/15/2023   Hand paresthesia 04/30/2023   Urine abnormality 11/19/2022   BPV (benign positional vertigo) 11/19/2022   Sore throat 10/12/2022   Acute non-recurrent frontal sinusitis 09/26/2022   Drug-induced myopathy 02/18/2022   Acute nonsuppurative otitis media of right ear 01/19/2022   Wheezing 01/19/2022   Abnormal liver function 11/16/2021  Diverticulitis of colon 11/16/2021   Hematochezia 11/16/2021   Deviated septum 11/09/2021   Urinary frequency 06/09/2021   SOBOE (shortness of breath on exertion) 06/09/2021   Facial droop 05/20/2021   Dizziness 05/20/2021   LFT elevation 03/28/2021   Right hip pain 01/31/2021   Other social stressor 12/13/2020   Dysphagia 07/22/2020   Abnormal cervical Papanicolaou smear 04/14/2020   Healthcare maintenance 07/22/2019   Cough 07/22/2019   Arthralgia 07/22/2019   Medicare annual wellness visit, initial 06/10/2018   Chest heaviness 06/10/2018   Osteopenia 09/10/2017   Fatty liver 05/19/2017   Diabetes mellitus without complication (HCC) 05/05/2017   History of cerebrovascular accident (CVA) due to ischemia 04/08/2016   Hypertensive disorder 10/27/2015   Advance care planning 04/29/2015    Obstructive sleep apnea of adult 07/25/2014   Palpitations 01/06/2013   Anxiety 09/01/2012   Shoulder pain 12/19/2011   MDD (major depressive disorder) 12/19/2011   OA (osteoarthritis) 12/19/2011   Hyperlipidemia associated with type 2 diabetes mellitus (HCC) 12/19/2011   Diverticulitis 12/19/2011   Migraines 12/19/2011   Hypothyroid 12/19/2011   Iritis 12/19/2011    PCP: Joaquim Nam, MD  REFERRING PROVIDER: Joaquim Nam, MD  REFERRING DIAG: M54.2 (ICD-10-CM) - Neck pain R20.2 (ICD-10-CM) - Hand paresthesia  THERAPY DIAG:  Muscle weakness (generalized)  Radiculopathy, cervical region  Cervicalgia  Rationale for Evaluation and Treatment: Rehabilitation  ONSET DATE: 04/28/2023 (referral date)  SUBJECTIVE:                                                                                                                                                                                                         SUBJECTIVE STATEMENT: Pt feels like DN was helpful last session, reports that she has decreased discomfort and increased ROM, doesn't feel as tight.  Pt overmassaged her R suboccipitals on Friday and was sore over the weekend, doing much better today. Pt does have ongoing numbness in her L index finger though it has stopped getting worse, has intermittent numbness in her R hand that resolves if she moves her neck around.  Hand dominance: Right  PERTINENT HISTORY:  PMH: insulin-dependent type 2 diabetes, hypertension, Nash cirrhosis  PAIN:  Are you having pain? Yes: NPRS scale: 3/10 Pain location: neck and down between shoulder blades Pain description: soreness Aggravating factors: moving my head Relieving factors: heated neck massager  PRECAUTIONS: None  RED FLAGS: None    WEIGHT BEARING RESTRICTIONS: No  FALLS:  Has patient fallen in last 6 months? Yes. Number of falls one fall, landed on her bottom  LIVING ENVIRONMENT: Lives with: lives with their  family (takes care of her 32 yo grandson) Lives in: House/apartment  OCCUPATION: still works full time -- Hospital doctor  PLOF: Independent with gait and Independent with transfers  PATIENT GOALS: "I want my finger back"  NEXT MD VISIT: sees Dr. Para March this Friday morning (9/13)  OBJECTIVE:   DIAGNOSTIC FINDINGS:  Plan to try PT before any imaging will be completed  COGNITION: Overall cognitive status: Within functional limits for tasks assessed  SENSATION: Light touch intact except in left index finger  POSTURE: rounded shoulders and forward head  PALPATION: Tightness in suboccipitals, splenius, UT, paraspinals, rhomboids (L>R)  CERVICAL ROM:   Active ROM A/PROM (deg) eval  Flexion 50  Extension 15  Right lateral flexion 20  Left lateral flexion 20  Right rotation 45  Left rotation 35   (Blank rows = not tested)  UPPER EXTREMITY ROM:  Active ROM Right eval Left eval  Shoulder flexion WFL WFL - discomfort in L thumb  Shoulder extension    Shoulder abduction WFL WFL - burning in forearm  Shoulder adduction    Shoulder extension    Shoulder internal rotation    Shoulder external rotation    Elbow flexion    Elbow extension    Wrist flexion    Wrist extension    Wrist ulnar deviation    Wrist radial deviation    Wrist pronation    Wrist supination     (Blank rows = not tested)  UPPER EXTREMITY MMT:  MMT Right eval Left eval  Shoulder flexion 5 4  Shoulder extension    Shoulder abduction 5 5  Shoulder adduction    Shoulder extension    Shoulder internal rotation    Shoulder external rotation    Middle trapezius    Lower trapezius    Elbow flexion 5 5  Elbow extension 5 5  Wrist flexion    Wrist extension    Wrist ulnar deviation    Wrist radial deviation    Wrist pronation    Wrist supination    Grip strength WFL WFL   (Blank rows = not tested)  CERVICAL SPECIAL TESTS:  Upper limb tension test (ULTT): Positive (for median  nerve)   TODAY'S TREATMENT:                                                                                                                               TherAct Trigger Point Dry-Needling  Treatment instructions: Expect mild to moderate muscle soreness. S/S of pneumothorax if dry needled over a lung field, and to seek immediate medical attention should they occur. Patient verbalized understanding of these instructions and education.  Patient Consent Given: Yes Education handout provided: Previously provided Muscles treated: L and R suboccipitals, L and R splenius capitus, L and R upper traps Treatment response/outcome: deep ache/pressure; muscle twitch detected; does have onset of R-sided headache with DN that resolves once needle removed  Reassessed CERVICAL ROM:   Active ROM A/PROM (deg) eval 06/26/23  Flexion 50   Extension 15   Right lateral flexion 20 32  Left lateral flexion 20 38  Right rotation 45 45  Left rotation 35 50   (Blank rows = not tested)   Manual Therapy Cervical distraction 5 x 30 sec each. Pt with some relief of symptoms into BUE with distraction this date, reports her RUE "feels more alive". Provided handout for where to purchase an inflatable cervical traction device online for personal use for relief of symptoms at home.  TherEx Cervical rotation assisted stretch with towel x 3 reps B Added to HEP, see bolded below  PATIENT EDUCATION:  Education details: TPDN, continue with HEP + added to HEP, PT POC with one added visit and plan to d/c next session, inflatable cervical traction device Person educated: Patient Education method: Explanation, Demonstration, Tactile cues, Verbal cues, and Handouts Education comprehension: verbalized understanding and returned demonstration  HOME EXERCISE PROGRAM: Access Code: [email or web address] URL: [email or web address] Date: 06/06/2023 Prepared by: Peter Congo  Exercises - Median Nerve Flossing - Tray  -  1 x daily - 5 x weekly - 1 sets - 5 reps - Seated Upper Trapezius Stretch  - 1 x daily - 5 x weekly - 1 sets - 3-5 reps - 30 sec hold - Gentle Levator Scapulae Stretch  - 1 x daily - 5 x weekly - 3 sets - 3-5 reps - Supine Suboccipital Release with Tennis Balls  - 1 x daily - 7 x weekly - 1 sets - 1 reps - 5-10 min hold - Seated Assisted Cervical Rotation with Towel  - 1 x daily - 7 x weekly - 1 sets - 5 reps - 5 sec hold   ASSESSMENT:  CLINICAL IMPRESSION: Emphasis of skilled PT session on performing TPDN again to address tightness in cervical and upper shoulder muscles as well as initiating assessment of LTGs. Pt with good response to DN after last session, benefits from treatment again. Pt also with relief of symptoms with cervical traction/distraction, benefits from information on where she can purchase device in order to perform this independently at home. Pt can benefit from one more skilled therapy session to assess understanding and performance of HEP as well as independence with management of her symptoms. Pt does exhibit improved cervical lateral flexion and rotation except for rotation to the R which is unchanged from initial evaluation. Added cervical rotation stretch to HEP this session and will reassess AROM next session. Continue POC.   OBJECTIVE IMPAIRMENTS: decreased knowledge of condition, decreased ROM, decreased strength, increased fascial restrictions, impaired sensation, impaired UE functional use, and postural dysfunction.   ACTIVITY LIMITATIONS: reach over head  PARTICIPATION LIMITATIONS: driving, community activity, and occupation  PERSONAL FACTORS: Age, Time since onset of injury/illness/exacerbation, and 1-2 comorbidities:     insulin-dependent type 2 diabetes, hypertension, Elita Boone cirrhosiare also affecting patient's functional outcome.   REHAB POTENTIAL: Good  CLINICAL DECISION MAKING: Stable/uncomplicated  EVALUATION COMPLEXITY: Low   GOALS: Goals reviewed  with patient? Yes  SHORT TERM GOALS=LONG TERM GOALS due to length of POC   LONG TERM GOALS: Target date: 07/10/2023 (updated to match last scheduled visit)  Pt will be independent with final HEP for improved cervical ROM, improved posture and management of pain symptoms in order to build upon functional gains made in therapy. Baseline: not initiated at eval Goal status: INITIAL  2.  Pt will increase lateral cervical flexion by >/= 10 degrees to demonstrate  improved function   Baseline 06/26/23  Right lateral flexion 20 32  Left lateral flexion 20 38   Goal status: MET  3.  Pt will increase cervical rotation by >/= 10 degrees to demonstrate improved function   Baseline 06/26/23  Right rotation 45 45  Left rotation 35 50   Goal status: IN PROGRESS     PLAN:  PT FREQUENCY: 1x/week  PT DURATION: 4 weeks  PLANNED INTERVENTIONS: Therapeutic exercises, Therapeutic activity, Neuromuscular re-education, Balance training, Gait training, Patient/Family education, Self Care, Joint mobilization, Dry Needling, Electrical stimulation, Spinal manipulation, Spinal mobilization, Cryotherapy, Moist heat, Taping, Manual therapy, and Re-evaluation  PLAN FOR NEXT SESSION: recert and d/c (assess cervical R rotation again), how is HEP and inflatable traction device?    Peter Congo, PT, DPT, [facility] [address] [place], [place]  [address] Phone:  [phone] Fax:  [phone]  06/26/2023, 9:32 AM

## 2023-06-27 NOTE — Telephone Encounter (Signed)
RECEIVED PT PAGES AND FAXED PROVIDER PAGES TO PROVIDER [facility] OFFICE  for REPATHA(AMGEN)    PLEASE BE ADVISED

## 2023-07-04 ENCOUNTER — Observation Stay (HOSPITAL_BASED_OUTPATIENT_CLINIC_OR_DEPARTMENT_OTHER)
Admission: EM | Admit: 2023-07-04 | Discharge: 2023-07-05 | Disposition: A | Discharge status: Home / Self Care | Payer: Medicare Other | Attending: Family Medicine | Admitting: Family Medicine

## 2023-07-04 ENCOUNTER — Other Ambulatory Visit (HOSPITAL_BASED_OUTPATIENT_CLINIC_OR_DEPARTMENT_OTHER): Payer: Self-pay

## 2023-07-04 ENCOUNTER — Other Ambulatory Visit (HOSPITAL_COMMUNITY): Payer: Self-pay

## 2023-07-04 ENCOUNTER — Encounter (HOSPITAL_BASED_OUTPATIENT_CLINIC_OR_DEPARTMENT_OTHER): Payer: Self-pay

## 2023-07-04 ENCOUNTER — Emergency Department (HOSPITAL_BASED_OUTPATIENT_CLINIC_OR_DEPARTMENT_OTHER): Payer: Medicare Other | Admitting: Radiology

## 2023-07-04 ENCOUNTER — Other Ambulatory Visit: Payer: Self-pay

## 2023-07-04 ENCOUNTER — Telehealth: Payer: Self-pay | Admitting: Family Medicine

## 2023-07-04 DIAGNOSIS — F419 Anxiety disorder, unspecified: Secondary | ICD-10-CM | POA: Diagnosis not present

## 2023-07-04 DIAGNOSIS — Z794 Long term (current) use of insulin: Secondary | ICD-10-CM | POA: Diagnosis not present

## 2023-07-04 DIAGNOSIS — L089 Local infection of the skin and subcutaneous tissue, unspecified: Secondary | ICD-10-CM | POA: Diagnosis not present

## 2023-07-04 DIAGNOSIS — Z7984 Long term (current) use of oral hypoglycemic drugs: Secondary | ICD-10-CM | POA: Insufficient documentation

## 2023-07-04 DIAGNOSIS — I10 Essential (primary) hypertension: Secondary | ICD-10-CM | POA: Diagnosis present

## 2023-07-04 DIAGNOSIS — E039 Hypothyroidism, unspecified: Secondary | ICD-10-CM | POA: Diagnosis not present

## 2023-07-04 DIAGNOSIS — Z8673 Personal history of transient ischemic attack (TIA), and cerebral infarction without residual deficits: Secondary | ICD-10-CM | POA: Insufficient documentation

## 2023-07-04 DIAGNOSIS — R6510 Systemic inflammatory response syndrome (SIRS) of non-infectious origin without acute organ dysfunction: Secondary | ICD-10-CM | POA: Diagnosis present

## 2023-07-04 DIAGNOSIS — Z91040 Latex allergy status: Secondary | ICD-10-CM | POA: Diagnosis not present

## 2023-07-04 DIAGNOSIS — M65842 Other synovitis and tenosynovitis, left hand: Secondary | ICD-10-CM | POA: Diagnosis not present

## 2023-07-04 DIAGNOSIS — Z87891 Personal history of nicotine dependence: Secondary | ICD-10-CM | POA: Diagnosis not present

## 2023-07-04 DIAGNOSIS — Z23 Encounter for immunization: Secondary | ICD-10-CM | POA: Diagnosis not present

## 2023-07-04 DIAGNOSIS — L03114 Cellulitis of left upper limb: Secondary | ICD-10-CM | POA: Insufficient documentation

## 2023-07-04 DIAGNOSIS — E119 Type 2 diabetes mellitus without complications: Secondary | ICD-10-CM | POA: Diagnosis not present

## 2023-07-04 DIAGNOSIS — L03119 Cellulitis of unspecified part of limb: Principal | ICD-10-CM

## 2023-07-04 DIAGNOSIS — Z79899 Other long term (current) drug therapy: Secondary | ICD-10-CM | POA: Insufficient documentation

## 2023-07-04 DIAGNOSIS — M19042 Primary osteoarthritis, left hand: Secondary | ICD-10-CM | POA: Diagnosis not present

## 2023-07-04 DIAGNOSIS — F32A Depression, unspecified: Secondary | ICD-10-CM

## 2023-07-04 LAB — CBC WITH DIFFERENTIAL/PLATELET
Abs Immature Granulocytes: 0.04 10*3/uL (ref 0.00–0.07)
Basophils Absolute: 0 10*3/uL (ref 0.0–0.1)
Basophils Relative: 0 %
Eosinophils Absolute: 0.1 10*3/uL (ref 0.0–0.5)
Eosinophils Relative: 1 %
HCT: 41.1 % (ref 36.0–46.0)
Hemoglobin: 14.2 g/dL (ref 12.0–15.0)
Immature Granulocytes: 0 %
Lymphocytes Relative: 12 %
Lymphs Abs: 1.4 10*3/uL (ref 0.7–4.0)
MCH: 30.5 pg (ref 26.0–34.0)
MCHC: 34.5 g/dL (ref 30.0–36.0)
MCV: 88.4 fL (ref 80.0–100.0)
Monocytes Absolute: 0.8 10*3/uL (ref 0.1–1.0)
Monocytes Relative: 6 %
Neutro Abs: 10 10*3/uL — ABNORMAL HIGH (ref 1.7–7.7)
Neutrophils Relative %: 81 %
Platelets: 141 10*3/uL — ABNORMAL LOW (ref 150–400)
RBC: 4.65 MIL/uL (ref 3.87–5.11)
RDW: 14 % (ref 11.5–15.5)
WBC: 12.4 10*3/uL — ABNORMAL HIGH (ref 4.0–10.5)
nRBC: 0 % (ref 0.0–0.2)

## 2023-07-04 LAB — COMPREHENSIVE METABOLIC PANEL
ALT: 36 U/L (ref 0–44)
AST: 31 U/L (ref 15–41)
Albumin: 4.6 g/dL (ref 3.5–5.0)
Alkaline Phosphatase: 99 U/L (ref 38–126)
Anion gap: 7 (ref 5–15)
BUN: 13 mg/dL (ref 8–23)
CO2: 26 mmol/L (ref 22–32)
Calcium: 9.8 mg/dL (ref 8.9–10.3)
Chloride: 102 mmol/L (ref 98–111)
Creatinine, Ser: 0.7 mg/dL (ref 0.44–1.00)
GFR, Estimated: >60 mL/min (ref >60)
Glucose, Bld: 168 mg/dL — ABNORMAL HIGH (ref 70–99)
Potassium: 3.8 mmol/L (ref 3.5–5.1)
Sodium: 135 mmol/L (ref 135–145)
Total Bilirubin: 1 mg/dL (ref 0.3–1.2)
Total Protein: 7.4 g/dL (ref 6.5–8.1)

## 2023-07-04 LAB — GLUCOSE, CAPILLARY
Glucose-Capillary: 114 mg/dL — ABNORMAL HIGH (ref 70–99)
Glucose-Capillary: 204 mg/dL — ABNORMAL HIGH (ref 70–99)

## 2023-07-04 LAB — LACTIC ACID, PLASMA
Lactic Acid, Venous: 1.8 mmol/L (ref 0.5–1.9)
Lactic Acid, Venous: 1.7 mmol/L (ref 0.5–1.9)

## 2023-07-04 MED ORDER — LISINOPRIL 20 MG PO TABS
40.0000 mg | ORAL_TABLET | Freq: Every day | ORAL | Status: DC
  Administered 2023-07-04 – 2023-07-05 (×2): 40 mg via ORAL
  Filled 2023-07-04 (×2): qty 2

## 2023-07-04 MED ORDER — BUTALBITAL-APAP-CAFFEINE 50-325-40 MG PO TABS: 1.0000 | ORAL_TABLET | Freq: Four times a day (QID) | ORAL | Status: DC | PRN

## 2023-07-04 MED ORDER — HYDRALAZINE HCL 20 MG/ML IJ SOLN: 10.0000 mg | INTRAMUSCULAR | Status: DC | PRN

## 2023-07-04 MED ORDER — INSULIN GLARGINE-YFGN 100 UNIT/ML ~~LOC~~ SOLN
35.0000 [IU] | Freq: Every day | SUBCUTANEOUS | Status: DC
  Administered 2023-07-04: 35 [IU] via SUBCUTANEOUS
  Filled 2023-07-04 (×3): qty 0.35

## 2023-07-04 MED ORDER — PIPERACILLIN-TAZOBACTAM 3.375 G IVPB
3.3750 g | Freq: Three times a day (TID) | INTRAVENOUS | Status: DC
  Administered 2023-07-04 – 2023-07-05 (×3): 3.375 g via INTRAVENOUS
  Filled 2023-07-04 (×3): qty 50

## 2023-07-04 MED ORDER — CITALOPRAM HYDROBROMIDE 20 MG PO TABS
20.0000 mg | ORAL_TABLET | Freq: Every day | ORAL | Status: DC
  Administered 2023-07-05: 20 mg via ORAL
  Filled 2023-07-04: qty 1

## 2023-07-04 MED ORDER — VANCOMYCIN HCL IN DEXTROSE 1-5 GM/200ML-% IV SOLN
1000.0000 mg | Freq: Once | INTRAVENOUS | Status: AC
  Administered 2023-07-04: 1000 mg via INTRAVENOUS
  Filled 2023-07-04: qty 200

## 2023-07-04 MED ORDER — ACETAMINOPHEN 325 MG PO TABS: 650.0000 mg | ORAL_TABLET | Freq: Four times a day (QID) | ORAL | Status: DC | PRN

## 2023-07-04 MED ORDER — VANCOMYCIN HCL 500 MG IV SOLR
500.0000 mg | Freq: Once | INTRAVENOUS | Status: AC
  Administered 2023-07-04: 500 mg via INTRAVENOUS

## 2023-07-04 MED ORDER — BUTALBITAL-APAP-CAFFEINE 50-325-40 MG PO TABS
1.0000 | ORAL_TABLET | Freq: Once | ORAL | Status: AC
  Administered 2023-07-04: 1 via ORAL
  Filled 2023-07-04: qty 1

## 2023-07-04 MED ORDER — INSULIN ASPART 100 UNIT/ML IJ SOLN
0.0000 [IU] | Freq: Three times a day (TID) | INTRAMUSCULAR | Status: DC
  Administered 2023-07-05 (×2): 2 [IU] via SUBCUTANEOUS

## 2023-07-04 MED ORDER — METOPROLOL SUCCINATE ER 50 MG PO TB24
50.0000 mg | ORAL_TABLET | Freq: Every day | ORAL | Status: DC
  Administered 2023-07-04 – 2023-07-05 (×2): 50 mg via ORAL
  Filled 2023-07-04 (×2): qty 1

## 2023-07-04 MED ORDER — HYDROCODONE-ACETAMINOPHEN 5-325 MG PO TABS: 1.0000 | ORAL_TABLET | Freq: Once | ORAL | Status: DC

## 2023-07-04 MED ORDER — CLONAZEPAM 0.5 MG PO TABS: 0.5000 mg | ORAL_TABLET | Freq: Every day | ORAL | Status: DC | PRN

## 2023-07-04 MED ORDER — ALBUTEROL SULFATE (2.5 MG/3ML) 0.083% IN NEBU: 2.5000 mg | INHALATION_SOLUTION | Freq: Four times a day (QID) | RESPIRATORY_TRACT | Status: DC | PRN

## 2023-07-04 MED ORDER — PIPERACILLIN-TAZOBACTAM 3.375 G IVPB 30 MIN
3.3750 g | Freq: Once | INTRAVENOUS | Status: AC
  Administered 2023-07-04: 3.375 g via INTRAVENOUS
  Filled 2023-07-04: qty 50

## 2023-07-04 MED ORDER — ACETAMINOPHEN 325 MG PO TABS
650.0000 mg | ORAL_TABLET | Freq: Once | ORAL | Status: AC
  Administered 2023-07-04: 650 mg via ORAL
  Filled 2023-07-04: qty 2

## 2023-07-04 MED ORDER — FENTANYL CITRATE PF 50 MCG/ML IJ SOSY
25.0000 ug | PREFILLED_SYRINGE | Freq: Once | INTRAMUSCULAR | Status: AC
  Administered 2023-07-04: 25 ug via INTRAVENOUS
  Filled 2023-07-04: qty 1

## 2023-07-04 MED ORDER — HYDROCODONE-ACETAMINOPHEN 5-325 MG PO TABS
1.0000 | ORAL_TABLET | Freq: Four times a day (QID) | ORAL | Status: DC | PRN
  Administered 2023-07-04 – 2023-07-05 (×2): 1 via ORAL
  Filled 2023-07-04 (×2): qty 1

## 2023-07-04 MED ORDER — ENOXAPARIN SODIUM 40 MG/0.4ML IJ SOSY
40.0000 mg | PREFILLED_SYRINGE | INTRAMUSCULAR | Status: DC
  Administered 2023-07-04: 40 mg via SUBCUTANEOUS
  Filled 2023-07-04: qty 0.4

## 2023-07-04 MED ORDER — PANTOPRAZOLE SODIUM 40 MG PO TBEC
40.0000 mg | DELAYED_RELEASE_TABLET | Freq: Every day | ORAL | Status: DC
  Administered 2023-07-04 – 2023-07-05 (×2): 40 mg via ORAL
  Filled 2023-07-04 (×2): qty 1

## 2023-07-04 MED ORDER — TETANUS-DIPHTH-ACELL PERTUSSIS 5-2.5-18.5 LF-MCG/0.5 IM SUSY
0.5000 mL | PREFILLED_SYRINGE | Freq: Once | INTRAMUSCULAR | Status: AC
  Administered 2023-07-04: 0.5 mL via INTRAMUSCULAR
  Filled 2023-07-04: qty 0.5

## 2023-07-04 MED ORDER — VANCOMYCIN HCL 1500 MG/300ML IV SOLN
1500.0000 mg | INTRAVENOUS | Status: DC
  Filled 2023-07-04 (×3): qty 300

## 2023-07-04 MED ORDER — FLUTICASONE PROPIONATE 50 MCG/ACT NA SUSP: 1.0000 | Freq: Every day | NASAL | Status: DC | PRN

## 2023-07-04 MED ORDER — ACETAMINOPHEN 650 MG RE SUPP: 650.0000 mg | Freq: Four times a day (QID) | RECTAL | Status: DC | PRN

## 2023-07-04 MED ORDER — SODIUM CHLORIDE 0.9% FLUSH
3.0000 mL | Freq: Two times a day (BID) | INTRAVENOUS | Status: DC
  Administered 2023-07-05: 3 mL via INTRAVENOUS

## 2023-07-04 MED ORDER — SACCHAROMYCES BOULARDII 250 MG PO CAPS
250.0000 mg | ORAL_CAPSULE | Freq: Two times a day (BID) | ORAL | Status: DC
  Administered 2023-07-04 – 2023-07-05 (×2): 250 mg via ORAL
  Filled 2023-07-04 (×2): qty 1

## 2023-07-04 MED ORDER — FENTANYL CITRATE PF 50 MCG/ML IJ SOSY: 25.0000 ug | PREFILLED_SYRINGE | INTRAMUSCULAR | Status: DC | PRN

## 2023-07-04 MED ORDER — CYCLOBENZAPRINE HCL 10 MG PO TABS: 10.0000 mg | ORAL_TABLET | Freq: Every day | ORAL | Status: DC | PRN

## 2023-07-04 NOTE — Consult Note (Signed)
Reason for Consult:Left ring finger infectioin Referring Physician: Madelyn Flavors Time called: 1600 Time at bedside: 1607   Allison Yoder is an 72 y.o. female.  HPI: Sharoyln cut her left ring finger with a knife on Sunday. It seemed to be doing well until last night when it began to swell and throb. She was seen an an outside ED where the EDP was worried about flex tenosyovitis. She was transferred to [facility] for admission and hand surgery evaluation. She is RHD.  Past Medical History:  Diagnosis Date   Allergy    Anemia    "all the time as a child"   Anxiety    Arthritis    "all my joints; worse in my hands" (10/27/2015)   Chronic lower back pain    Depression    Diverticulosis    Fatty liver    GERD (gastroesophageal reflux disease)    Grade I diastolic dysfunction    Heart murmur    "dx'd years ago; very mild; never treated" (10/27/2015)   Hyperlipidemia    Hypertension    Hypothyroidism 2002-~ 2010   Iritis    per [facility]   Migraines    "stopped when I went thru menopause"   NASH (nonalcoholic steatohepatitis)    OSA on CPAP    Pneumonia ~ 2013; 10/27/2015   Recurrent urinary tract infection    Routine general medical examination at a health care facility 02/26/2014   Snores    Stroke (HCC) 2017   TIAs x2.  No deficits   Type II diabetes mellitus (HCC)    "lost weight; started exercising again; don't have it anymore" (10/27/2015)   Urinary incontinence    Wears glasses    Wears hearing aid in both ears     Past Surgical History:  Procedure Laterality Date   BLEPHAROPLASTY Bilateral 2013; 2016   BREAST LUMPECTOMY Right 1964   benign tumor,   CATARACT EXTRACTION W/PHACO Right 01/31/2023   Procedure: CATARACT EXTRACTION PHACO AND INTRAOCULAR LENS PLACEMENT (IOC) RIGHT DIABETIC  5.04  00:36.2;  Surgeon: Galen Manila, MD;  Location: [facility] SURGERY [facility];  Service: Ophthalmology;  Laterality: Right;  Diabetic   CATARACT EXTRACTION W/PHACO Left 02/14/2023    Procedure: CATARACT EXTRACTION PHACO AND INTRAOCULAR LENS PLACEMENT (IOC) LEFT DIABETIC  5.73  00:40.1;  Surgeon: Galen Manila, MD;  Location: [facility] SURGERY [facility];  Service: Ophthalmology;  Laterality: Left;  Diabetic   HEMORRHOID BANDING  X 2   KNEE ARTHROSCOPY Left 2016   "meniscus repair"   PELVIC FLOOR REPAIR  2001   "lift"   POLYPECTOMY  X 3   "bladder"   SHOULDER ARTHROSCOPY W/ ROTATOR CUFF REPAIR Left 2013   SHOULDER ARTHROSCOPY W/ ROTATOR CUFF REPAIR Right 2015   TUBAL LIGATION  ~ 1986   VAGINAL HYSTERECTOMY  1992   Partial    Family History  Problem Relation Age of Onset   Arthritis Mother    Heart disease Mother    Hyperlipidemia Mother    Hypertension Mother    Diabetes Mother    Depression Mother    Stroke Mother    Alcohol abuse Father    Lung cancer Father    Arthritis Maternal Grandmother    Stroke Maternal Grandmother    Diabetes Maternal Grandmother    Heart disease Paternal Uncle        x 2   Arthritis Sister    Breast cancer Cousin    Heart disease Paternal Aunt    Diabetes Paternal Uncle  Diabetes Sister    Heart disease Paternal Uncle        x 5   Heart disease Paternal Aunt        x 3   Stroke Paternal Aunt    Dementia Paternal Grandmother    Colon cancer Neg Hx     Social History:  reports that she quit smoking about 37 years ago. Her smoking use included cigarettes. She started smoking about 42 years ago. She has a 10 pack-year smoking history. She has never used smokeless tobacco. She reports that she does not currently use alcohol. She reports that she does not use drugs.  Allergies:  Allergies  Allergen Reactions   Cephalexin Itching    Does tolerate augmentin   Codeine Itching and Rash   Inapsine [Droperidol] Shortness Of Breath, Anxiety and Hypertension    Elevated HR and BP, panic attack   Crestor [Rosuvastatin]     Aches   Lipitor [Atorvastatin Calcium]     Myalgias    Metformin And Related     Does not tolerate  high-dose metformin due to GI symptoms.   Niacin And Related     Flushing   Pravastatin     Myalgias   Adhesive [Tape] Rash    Blisters with tape   Latex Rash    When gloves are personally worn    Medications: I have reviewed the patient's current medications.  Results for orders placed or performed during the hospital encounter of 07/04/23 (from the past 48 hour(s))  CBC with Differential     Status: Abnormal   Collection Time: 07/04/23  9:30 AM  Result Value Ref Range   WBC 12.4 (H) 4.0 - 10.5 K/uL   RBC 4.65 3.87 - 5.11 MIL/uL   Hemoglobin 14.2 12.0 - 15.0 g/dL   HCT 16.1 09.6 - 04.5 %   MCV 88.4 80.0 - 100.0 fL   MCH 30.5 26.0 - 34.0 pg   MCHC 34.5 30.0 - 36.0 g/dL   RDW 40.9 81.1 - 91.4 %   Platelets 141 (L) 150 - 400 K/uL   nRBC 0.0 0.0 - 0.2 %   Neutrophils Relative % 81 %   Neutro Abs 10.0 (H) 1.7 - 7.7 K/uL   Lymphocytes Relative 12 %   Lymphs Abs 1.4 0.7 - 4.0 K/uL   Monocytes Relative 6 %   Monocytes Absolute 0.8 0.1 - 1.0 K/uL   Eosinophils Relative 1 %   Eosinophils Absolute 0.1 0.0 - 0.5 K/uL   Basophils Relative 0 %   Basophils Absolute 0.0 0.0 - 0.1 K/uL   Immature Granulocytes 0 %   Abs Immature Granulocytes 0.04 0.00 - 0.07 K/uL    Comment: Performed at [facility], [address] [place], [place] [address]  Comprehensive metabolic panel     Status: Abnormal   Collection Time: 07/04/23  9:30 AM  Result Value Ref Range   Sodium 135 135 - 145 mmol/L   Potassium 3.8 3.5 - 5.1 mmol/L   Chloride 102 98 - 111 mmol/L   CO2 26 22 - 32 mmol/L   Glucose, Bld 168 (H) 70 - 99 mg/dL    Comment: Glucose reference range applies only to samples taken after fasting for at least 8 hours.   BUN 13 8 - 23 mg/dL   Creatinine, Ser 6.21 0.44 - 1.00 mg/dL   Calcium 9.8 8.9 - 30.8 mg/dL   Total Protein 7.4 6.5 - 8.1 g/dL   Albumin 4.6 3.5 - 5.0 g/dL  AST 31 15 - 41 U/L   ALT 36 0 - 44 U/L   Alkaline Phosphatase 99 38 - 126 U/L   Total Bilirubin  1.0 0.3 - 1.2 mg/dL   GFR, Estimated >96 >29 mL/min    Comment: (NOTE) Calculated using the CKD-EPI Creatinine Equation (2021)    Anion gap 7 5 - 15    Comment: Performed at [facility], [address], [place], [place] [address]  Lactic acid, plasma     Status: None   Collection Time: 07/04/23  9:30 AM  Result Value Ref Range   Lactic Acid, Venous 1.8 0.5 - 1.9 mmol/L    Comment: Performed at [facility], [address], [place], [place] [address]  Lactic acid, plasma     Status: None   Collection Time: 07/04/23 11:30 AM  Result Value Ref Range   Lactic Acid, Venous 1.7 0.5 - 1.9 mmol/L    Comment: Performed at [facility], [address] [place], [place] [address]  Glucose, capillary     Status: Abnormal   Collection Time: 07/04/23  3:58 PM  Result Value Ref Range   Glucose-Capillary 114 (H) 70 - 99 mg/dL    Comment: Glucose reference range applies only to samples taken after fasting for at least 8 hours.   Comment 1 Document in Chart     DG Hand Complete Left  Result Date: 07/04/2023 CLINICAL DATA:  Left fourth finger infection. EXAM: LEFT HAND - COMPLETE 3+ VIEW COMPARISON:  Left wrist radiographs 08/16/2017 FINDINGS: There is diffuse decreased bone mineralization. Mild radiocarpal joint space narrowing. Mild degenerative cystic change at the distal radial styloid. 2 mm ulnar positive variance. Mild-to-moderate triscaphe and thumb intercarpal joint space narrowing and peripheral osteophytosis. Mild-to-moderate thumb interphalangeal and second through fifth DIP and fourth PIP joint space narrowing and peripheral osteophytosis. There is mild irregularity of the soft tissues at the lateral/radial aspect of the fourth finger at the base of the middle phalanx consistent with reported infection/wound. No definite cortical erosion is seen. IMPRESSION: 1. Mild irregularity of the soft tissues at the lateral/radial aspect of the  fourth finger at the base of the middle phalanx consistent with reported infection/wound. No definite cortical erosion is seen to indicate radiographic evidence of acute osteomyelitis. 2. Mild-to-moderate osteoarthritis of the left hand and wrist. Electronically Signed   By: Neita Garnet M.D.   On: 07/04/2023 12:35    Review of Systems  Constitutional:  Negative for chills, diaphoresis and fever.  HENT:  Negative for ear discharge, ear pain, hearing loss and tinnitus.   Eyes:  Negative for photophobia and pain.  Respiratory:  Negative for cough and shortness of breath.   Cardiovascular:  Negative for chest pain.  Gastrointestinal:  Negative for abdominal pain, nausea and vomiting.  Genitourinary:  Negative for dysuria, flank pain, frequency and urgency.  Musculoskeletal:  Positive for arthralgias (Left index finger). Negative for back pain, myalgias and neck pain.  Neurological:  Negative for dizziness and headaches.  Hematological:  Does not bruise/bleed easily.  Psychiatric/Behavioral:  The patient is not nervous/anxious.    Blood pressure (!) 147/75, pulse 100, temperature (!) 100.9 F (38.3 C), temperature source Oral, resp. rate (!) 21, height 5' 6.5" (1.689 m), weight 83.9 kg, SpO2 93%. Physical Exam Constitutional:      General: She is not in acute distress.    Appearance: She is well-developed. She is not diaphoretic.  HENT:     Head: Normocephalic and atraumatic.  Eyes:  General: No scleral icterus.       Right eye: No discharge.        Left eye: No discharge.     Conjunctiva/sclera: Conjunctivae normal.  Cardiovascular:     Rate and Rhythm: Normal rate and regular rhythm.  Pulmonary:     Effort: Pulmonary effort is normal. No respiratory distress.  Musculoskeletal:     Cervical back: Normal range of motion.     Comments: Left shoulder, elbow, wrist, digits- Laceration radial aspect ring finger P2, mild TTP P3/palm, nontender, no instability, no blocks to motion  Sens   Ax/R/M/U intact except as above  Mot   Ax/ R/ PIN/ M/ AIN/ U intact  Rad 2+  Skin:    General: Skin is warm and dry.  Neurological:     Mental Status: She is alert.  Psychiatric:        Mood and Affect: Mood normal.        Behavior: Behavior normal.     Assessment/Plan: Left ring finger infection -- Low suspicion for flexor tenosynovitis at this time. Will make NPO after MN and recheck tomorrow.    Freeman Caldron, PA-C Orthopedic Surgery [phone] 07/04/2023, 4:14 PM

## 2023-07-04 NOTE — Plan of Care (Addendum)
72 y/o female pmh of DM who cut her left ring finger 2 days ago and presents with worsening infection.  She had initially cleaned it with Betadine but noted worsening swelling, pain, and redness.  WBC noted to be elevated 12.4, vital signs were otherwise noted to be stable.  Dr. Denese Killings of hand surgery had been consulted.  Patient was started on Zosyn and vancomycin.  Okay for patient to have diet and Will need to be n.p.o. after midnight for possible surgery on 10/23

## 2023-07-04 NOTE — Telephone Encounter (Signed)
I spoke with pt; pt is presently at [facility] ED at this time. Sending note to Dr Para March as Lorain Childes to PCP.

## 2023-07-04 NOTE — H&P (Signed)
History and Physical    Patient: Allison Yoder ZOX:[id number] DOB: February 25, 1951 DOA: 07/04/2023 DOS: the patient was seen and examined on 07/04/2023 PCP: Joaquim Nam, MD  Patient coming from: Transfer from [facility]  Chief Complaint:  Chief Complaint  Patient presents with   Wound Check   HPI: Allison Yoder is a 72 y.o. female with medical history significant of hypertension, hyperlipidemia, hypothyroidism, GERD, OSA on CPAP who presented with complaints of pain and swelling in her left ring finger.  2 days ago she was slicing bread and accidentally cut her finger.  She had raincoat water on it and dried washing it out prior to putting Betadine on it.  Yesterday she reported that the wound stopped bleeding, but after going to bed around 8 PM reported that had started throbbing.  She got up overnight and changed her dressing and put Neosporin on it.  Despite doing so patient reported increased swelling and pain in her finger.  Noted associated symptoms of fever, headache, and reports having some diarrhea.  Denies having any recent nausea, vomiting, shortness of breath, abdominal pain, or dysuria symptoms.  In the emergency department patient was noted to be febrile up to 101.6 F with vital signs otherwise relatively maintained.  Labs significant for WBC elevated 12.4, platelets 141, and lactic acid 1.8->1.7.  X-rays of the left hand noted mild irregularity of the soft tissues at the lateral/radial aspect of the 4th finger base consistent with wound infection, and without signs for osteomyelitis.  Blood cultures have been obtained as patient met SIRS criteria.  Patient had been given Tdap booster, vancomycin, Zosyn.  Review of Systems: As mentioned in the history of present illness. All other systems reviewed and are negative. Past Medical History:  Diagnosis Date   Allergy    Anemia    "all the time as a child"   Anxiety    Arthritis    "all my joints; worse in my hands" (10/27/2015)    Chronic lower back pain    Depression    Diverticulosis    Fatty liver    GERD (gastroesophageal reflux disease)    Grade I diastolic dysfunction    Heart murmur    "dx'd years ago; very mild; never treated" (10/27/2015)   Hyperlipidemia    Hypertension    Hypothyroidism 2002-~ 2010   Iritis    per [facility]   Migraines    "stopped when I went thru menopause"   NASH (nonalcoholic steatohepatitis)    OSA on CPAP    Pneumonia ~ 2013; 10/27/2015   Recurrent urinary tract infection    Routine general medical examination at a health care facility 02/26/2014   Snores    Stroke (HCC) 2017   TIAs x2.  No deficits   Type II diabetes mellitus (HCC)    "lost weight; started exercising again; don't have it anymore" (10/27/2015)   Urinary incontinence    Wears glasses    Wears hearing aid in both ears    Past Surgical History:  Procedure Laterality Date   BLEPHAROPLASTY Bilateral 2013; 2016   BREAST LUMPECTOMY Right 1964   benign tumor,   CATARACT EXTRACTION W/PHACO Right 01/31/2023   Procedure: CATARACT EXTRACTION PHACO AND INTRAOCULAR LENS PLACEMENT (IOC) RIGHT DIABETIC  5.04  00:36.2;  Surgeon: Galen Manila, MD;  Location: [facility];  Service: Ophthalmology;  Laterality: Right;  Diabetic   CATARACT EXTRACTION W/PHACO Left 02/14/2023   Procedure: CATARACT EXTRACTION PHACO AND INTRAOCULAR LENS PLACEMENT (IOC) LEFT  DIABETIC  5.73  00:40.1;  Surgeon: Galen Manila, MD;  Location: [facility];  Service: Ophthalmology;  Laterality: Left;  Diabetic   HEMORRHOID BANDING  X 2   KNEE ARTHROSCOPY Left 2016   "meniscus repair"   PELVIC FLOOR REPAIR  2001   "lift"   POLYPECTOMY  X 3   "bladder"   SHOULDER ARTHROSCOPY W/ ROTATOR CUFF REPAIR Left 2013   SHOULDER ARTHROSCOPY W/ ROTATOR CUFF REPAIR Right 2015   TUBAL LIGATION  ~ 1986   VAGINAL HYSTERECTOMY  1992   Partial   Social History:  reports that she quit smoking about 37 years ago. Her smoking use  included cigarettes. She started smoking about 42 years ago. She has a 10 pack-year smoking history. She has never used smokeless tobacco. She reports that she does not currently use alcohol. She reports that she does not use drugs.  Allergies  Allergen Reactions   Cephalexin Itching    Does tolerate augmentin   Codeine Itching and Rash   Inapsine [Droperidol] Shortness Of Breath, Anxiety and Hypertension    Elevated HR and BP, panic attack   Crestor [Rosuvastatin]     Aches   Lipitor [Atorvastatin Calcium]     Myalgias    Metformin And Related     Does not tolerate high-dose metformin due to GI symptoms.   Niacin And Related     Flushing   Pravastatin     Myalgias   Adhesive [Tape] Rash    Blisters with tape   Latex Rash    When gloves are personally worn    Family History  Problem Relation Age of Onset   Arthritis Mother    Heart disease Mother    Hyperlipidemia Mother    Hypertension Mother    Diabetes Mother    Depression Mother    Stroke Mother    Alcohol abuse Father    Lung cancer Father    Arthritis Maternal Grandmother    Stroke Maternal Grandmother    Diabetes Maternal Grandmother    Heart disease Paternal Uncle        x 2   Arthritis Sister    Breast cancer Cousin    Heart disease Paternal Aunt    Diabetes Paternal Uncle    Diabetes Sister    Heart disease Paternal Uncle        x 5   Heart disease Paternal Aunt        x 3   Stroke Paternal Aunt    Dementia Paternal Grandmother    Colon cancer Neg Hx     Prior to Admission medications   Medication Sig Start Date End Date Taking? Authorizing Provider  albuterol (VENTOLIN HFA) 108 (90 Base) MCG/ACT inhaler INHALE 1 TO 2 PUFFS INTO THE LUNGS EVERY 6 HOURS AS NEEDED FOR WHEEZING OR SHORTNESS OF BREATH 01/19/22  Yes Dugal, Tabitha, FNP  betamethasone valerate ointment (VALISONE) 0.1 % Apply 1 Application topically daily. 05/12/23  Yes [provider]  citalopram (CELEXA) 20 MG tablet TAKE 1  TABLET(20 MG) BY MOUTH DAILY 11/07/22  Yes Joaquim Nam, MD  clonazePAM (KLONOPIN) 0.5 MG tablet Take 1 tablet (0.5 mg total) by mouth daily as needed for anxiety. 11/16/21  Yes Joaquim Nam, MD  cyanocobalamin 1000 MCG tablet Take 1,000 mcg by mouth daily.   Yes [provider]  cyclobenzaprine (FLEXERIL) 10 MG tablet Take 1 tablet (10 mg total) by mouth daily as needed for muscle spasms. 09/03/19  Yes Crawford Givens  S, MD  Evolocumab (REPATHA SURECLICK) 140 MG/ML SOAJ Inject 140 mg into the skin every 14 (fourteen) days. 08/01/22  Yes Wendall Stade, MD  fluticasone (FLONASE) 50 MCG/ACT nasal spray Place 1 spray into both nostrils daily as needed for allergies or rhinitis.   Yes [provider]  insulin glargine (LANTUS SOLOSTAR) 100 UNIT/ML Solostar Pen Inject 50 Units into the skin daily. Sliding scale 52-54 units   Yes [provider]  JANUVIA 50 MG tablet TAKE 1 TABLET(50 MG) BY MOUTH DAILY 10/28/22  Yes Joaquim Nam, MD  lisinopril (ZESTRIL) 40 MG tablet TAKE 1 TABLET(40 MG) BY MOUTH DAILY 10/28/22  Yes Joaquim Nam, MD  metFORMIN (GLUCOPHAGE-XR) 500 MG 24 hr tablet Take 1 tablet (500 mg total) by mouth 2 (two) times daily with a meal. 05/26/23  Yes Joaquim Nam, MD  metoprolol succinate (TOPROL-XL) 50 MG 24 hr tablet TAKE 1 TABLET BY MOUTH DAILY WITH OR IMMEDIATELY FOLLOWING A MEAL 10/28/22  Yes Joaquim Nam, MD  Multiple Vitamin (MULTIVITAMIN) tablet Take 1 tablet by mouth daily.   Yes [provider]  pantoprazole (PROTONIX) 40 MG tablet Take 1 tablet (40 mg total) by mouth daily. 05/17/23  Yes Almon Hercules, MD  Vitamin D, Cholecalciferol, 1000 units CAPS Take 3,000 Units by mouth daily.   Yes [provider]  Blood Glucose Monitoring Suppl (ONE TOUCH ULTRA 2) w/Device KIT Check blood sugar once daily and as instructed. Dx E11.9 12/29/16   Joaquim Nam, MD  glucose blood (ONETOUCH ULTRA) test strip as directed to check blood  sugar daily. Dx E11.9 11/10/22   Joaquim Nam, MD  insulin glargine, 1 Unit Dial, (TOUJEO SOLOSTAR) 300 UNIT/ML Solostar Pen Inject 50 Units into the skin daily. Patient not taking: Reported on 07/04/2023 06/07/23   Joaquim Nam, MD  Insulin Pen Needle (PEN NEEDLES) 32G X 5 MM MISC Use with insulin pen 01/05/23   Joaquim Nam, MD  Lancets [facility] DELICA PLUS [place]) MISC USE ONCE DAILY AS DIRECTED 11/15/22   Joaquim Nam, MD    Physical Exam: Vitals:   07/04/23 1433 07/04/23 1459 07/04/23 1500 07/04/23 1544  BP: (!) 159/82 (!) 149/86 (!) 149/86 (!) 147/75  Pulse: 91 95 96 100  Resp: 17 20 (!) 21 18  Temp: (!) 101.6 F (38.7 C) (!) 101.6 F (38.7 C)  (!) 100.9 F (38.3 C)  TempSrc: Oral Oral  Oral  SpO2: 97% 98% 95% 93%  Weight:      Height:        Constitutional: Elderly female currently in no acute distress. Eyes: PERRL, lids and conjunctivae normal ENMT: Mucous membranes are moist.  Normal dentition.  Neck: normal, supple, no masses,   Respiratory: clear to auscultation bilaterally, no wheezing, no crackles. Normal respiratory effort. No accessory muscle use.  Cardiovascular: Regular rate and rhythm, no murmurs / rubs / gallops. No extremity edema. 2+ pedal pulses.  Abdomen: no tenderness, no masses palpated.  Bowel sounds positive.  Musculoskeletal: no clubbing / cyanosis.  Decreased range of motion of the left fourth finger. Skin: no rashes, lesions, ulcers. No induration Neurologic: CN 2-12 grossly intact. Sensation intact, DTR normal. Strength 5/5 in all 4.  Psychiatric: Normal judgment and insight. Alert and oriented x 3. Normal mood.   Data Reviewed:  Reviewed labs, imaging, and pertinent records as documented.  Assessment and Plan: SIRS/sepsis Left fourth finger wound infection Acute.  Patient reported cutting her finger while slicing bread  2 days ago.  Since yesterday evening patient reported increased swelling and pain and redness with decreased  range of motion of the finger.  Patient was noted to be febrile up to 101.2 F with white blood cell count elevated at 12.4.  X-rays revealed soft tissue swelling without bony involvement.  Hand surgery had been consulted for possible need of surgery.  Lactic acid levels were noted to be within normal limits.  Blood cultures were obtained.  Patient had been given a Tdap booster, vancomycin, and Zosyn.  Lower suspicion for tensor synovitis -Admit to a MedSurg bed -Follow-up blood cultures  -N.p.o. after midnight for possible need of surgery -Continue empiric antibiotics of vancomycin and Zosyn -Hydrocodone/fentanyl IV as needed for moderate severe pain respectively -Hand surgery consulted, will follow-up for any further recommendations  Controlled diabetes mellitus type 2, with long-term use of insulin On admission glucose noted to be 168.  Patient on 50 units of glargine nightly.  Last hemoglobin A1c was noted to be 7 on 04/28/2023. -Hypoglycemic protocols -Hold Januvia and metformin -Pharmacy substitution of Semglee reduced to 35 units nightly in case of need of surgery -CBGs before every meal with sensitive SSI  Essential hypertension Blood pressures noted to be elevated up to 159/82. -Continue metoprolol and lisinopril  Anxiety and depression -Continue Celexa  DVT prophylaxis: Lovenox Advance Care Planning:   Code Status: Full Code    Consults: Hand surgery  Family Communication: None  Severity of Illness: The appropriate patient status for this patient is INPATIENT. Inpatient status is judged to be reasonable and necessary in order to provide the required intensity of service to ensure the patient's safety. The patient's presenting symptoms, physical exam findings, and initial radiographic and laboratory data in the context of their chronic comorbidities is felt to place them at high risk for further clinical deterioration. Furthermore, it is not anticipated that the patient will be  medically stable for discharge from the hospital within 2 midnights of admission.   * I certify that at the point of admission it is my clinical judgment that the patient will require inpatient hospital care spanning beyond 2 midnights from the point of admission due to high intensity of service, high risk for further deterioration and high frequency of surveillance required.*  Author: Clydie Braun, MD 07/04/2023 4:36 PM  For on call review [email or web address].

## 2023-07-04 NOTE — Telephone Encounter (Signed)
Pt called in stated that she cut her finger and finger was bleeding bad sharp pain going up her arm the bleeding stop  but the finger started draining and the arm is hurting I offer pt appt today at 12 she stated ill be dead by then and hung up I called pt back and told her I was trying to tell her I will transfer her to access nurse pt stated ok and she sorry for being angry I then transfer her

## 2023-07-04 NOTE — Telephone Encounter (Signed)
Agree with ER eval.  Thanks.  

## 2023-07-04 NOTE — Progress Notes (Signed)
Pharmacy Antibiotic Note  Allison Yoder is a 72 y.o. female admitted on 07/04/2023 with cellulitis. Pharmacy has been consulted for vancomycin and zosyn dosing. Pt is afebrile and WBC is elevated at 12.3. SCr and lactic acid are WNL.   Plan: Vancomycin 1500mg  IV Q24h  Zosyn 3.375g IV Q8H (4 hr inf) F/u renal fxn, C&S, clinical status and peak/trough at SS  Height: 5' 6.5" (168.9 cm) Weight: 83.9 kg (185 lb) IBW/kg (Calculated) : 60.45  Temp (24hrs), Avg:98.9 F (37.2 C), Min:98.9 F (37.2 C), Max:98.9 F (37.2 C)  Recent Labs  Lab 07/04/23 0930  WBC 12.4*  CREATININE 0.70  LATICACIDVEN 1.8    Estimated Creatinine Clearance: 70.1 mL/min (by C-G formula based on SCr of 0.7 mg/dL).    Allergies  Allergen Reactions   Cephalexin Itching    Does tolerate augmentin   Codeine Itching and Rash   Inapsine [Droperidol] Shortness Of Breath, Anxiety and Hypertension    Elevated HR and BP, panic attack   Crestor [Rosuvastatin]     Aches   Lipitor [Atorvastatin Calcium]     Myalgias    Metformin And Related     Does not tolerate high-dose metformin due to GI symptoms.   Niacin And Related     Flushing   Pravastatin     Myalgias   Adhesive [Tape] Rash    Blisters with tape   Latex Rash    When gloves are personally worn    Antimicrobials this admission: Vanc 10/22>> Zosyn 10/22>>  Dose adjustments this admission: N/A  Microbiology results: Pending  Thank you for allowing pharmacy to be a part of this patient's care.  Carlisa Eble, Drake Leach 07/04/2023 9:37 AM

## 2023-07-04 NOTE — ED Triage Notes (Signed)
In for eval laceration to left ring finger. Cut it Sunday evening with a kitchen knife. Cleaned it several time. Pain started yesterday afternoon. Swelling and redness onset last pm. Red streaks up her hand. Took tylenol 0300 this am.

## 2023-07-04 NOTE — ED Provider Notes (Signed)
[place] [facility]PARTMENT AT [facility] Provider Note   CSN: [id number] Arrival date & time: 07/04/23  4010     History  Chief Complaint  Patient presents with   Wound Check    Allison Yoder is a 72 y.o. female.   Wound Check  72 year old female history of type 2 diabetes, GERD, pretension, hyperlipidemia presenting for concern for finger infection.  On Sunday night she was cutting food with a kitchen knife.  She suffered a laceration to the left ring finger on the lateral aspect.  She cleaned it thoroughly including using Betadine.  She did not seek care at the time.  She had some slight swelling last night and then woke up this morning with severe redness, swelling, pain, difficulty moving her finger.  She also has red streaking up the hand.  She took some Tylenol but still has significant pain.  No fevers.  No other injuries.  Unsure of last tetanus.     Home Medications Prior to Admission medications   Medication Sig Start Date End Date Taking? Authorizing Provider  albuterol (VENTOLIN HFA) 108 (90 Base) MCG/ACT inhaler INHALE 1 TO 2 PUFFS INTO THE LUNGS EVERY 6 HOURS AS NEEDED FOR WHEEZING OR SHORTNESS OF BREATH 01/19/22  Yes Dugal, Tabitha, FNP  betamethasone valerate ointment (VALISONE) 0.1 % Apply 1 Application topically daily. 05/12/23  Yes [provider]  citalopram (CELEXA) 20 MG tablet TAKE 1 TABLET(20 MG) BY MOUTH DAILY 11/07/22  Yes Joaquim Nam, MD  clonazePAM (KLONOPIN) 0.5 MG tablet Take 1 tablet (0.5 mg total) by mouth daily as needed for anxiety. 11/16/21  Yes Joaquim Nam, MD  cyanocobalamin 1000 MCG tablet Take 1,000 mcg by mouth daily.   Yes [provider]  cyclobenzaprine (FLEXERIL) 10 MG tablet Take 1 tablet (10 mg total) by mouth daily as needed for muscle spasms. 09/03/19  Yes Joaquim Nam, MD  Evolocumab (REPATHA SURECLICK) 140 MG/ML SOAJ Inject 140 mg into the skin every 14 (fourteen) days. 08/01/22  Yes  Wendall Stade, MD  fluticasone (FLONASE) 50 MCG/ACT nasal spray Place 1 spray into both nostrils daily as needed for allergies or rhinitis.   Yes [provider]  insulin glargine (LANTUS SOLOSTAR) 100 UNIT/ML Solostar Pen Inject 50 Units into the skin daily. Sliding scale 52-54 units   Yes [provider]  JANUVIA 50 MG tablet TAKE 1 TABLET(50 MG) BY MOUTH DAILY 10/28/22  Yes Joaquim Nam, MD  lisinopril (ZESTRIL) 40 MG tablet TAKE 1 TABLET(40 MG) BY MOUTH DAILY 10/28/22  Yes Joaquim Nam, MD  metFORMIN (GLUCOPHAGE-XR) 500 MG 24 hr tablet Take 1 tablet (500 mg total) by mouth 2 (two) times daily with a meal. 05/26/23  Yes Joaquim Nam, MD  metoprolol succinate (TOPROL-XL) 50 MG 24 hr tablet TAKE 1 TABLET BY MOUTH DAILY WITH OR IMMEDIATELY FOLLOWING A MEAL 10/28/22  Yes Joaquim Nam, MD  Multiple Vitamin (MULTIVITAMIN) tablet Take 1 tablet by mouth daily.   Yes [provider]  pantoprazole (PROTONIX) 40 MG tablet Take 1 tablet (40 mg total) by mouth daily. 05/17/23  Yes Almon Hercules, MD  Vitamin D, Cholecalciferol, 1000 units CAPS Take 3,000 Units by mouth daily.   Yes [provider]  Blood Glucose Monitoring Suppl (ONE TOUCH ULTRA 2) w/Device KIT Check blood sugar once daily and as instructed. Dx E11.9 12/29/16   Joaquim Nam, MD  glucose blood (ONETOUCH ULTRA) test strip as directed to check blood  sugar daily. Dx E11.9 11/10/22   Joaquim Nam, MD  insulin glargine, 1 Unit Dial, (TOUJEO SOLOSTAR) 300 UNIT/ML Solostar Pen Inject 50 Units into the skin daily. Patient not taking: Reported on 07/04/2023 06/07/23   Joaquim Nam, MD  Insulin Pen Needle (PEN NEEDLES) 32G X 5 MM MISC Use with insulin pen 01/05/23   Joaquim Nam, MD  Lancets [facility] DELICA PLUS [place]) MISC USE ONCE DAILY AS DIRECTED 11/15/22   Joaquim Nam, MD      Allergies    Cephalexin, Codeine, Inapsine [droperidol], Crestor [rosuvastatin], Lipitor [atorvastatin  calcium], Metformin and related, Niacin and related, Pravastatin, Adhesive [tape], and Latex    Review of Systems   Review of Systems Review of systems completed and notable as per HPI.  ROS otherwise negative.   Physical Exam Updated Vital Signs BP (!) 159/82 (BP Location: Right Arm)   Pulse 91   Temp (!) 101.6 F (38.7 C) (Oral)   Resp 17   Ht 5' 6.5" (1.689 m)   Wt 83.9 kg   SpO2 97%   BMI 29.41 kg/m  Physical Exam Vitals and nursing note reviewed.  Constitutional:      General: She is not in acute distress.    Appearance: She is well-developed.  HENT:     Head: Normocephalic and atraumatic.  Eyes:     Conjunctiva/sclera: Conjunctivae normal.  Cardiovascular:     Rate and Rhythm: Normal rate and regular rhythm.     Pulses: Normal pulses.     Heart sounds: Normal heart sounds. No murmur heard. Pulmonary:     Effort: Pulmonary effort is normal. No respiratory distress.     Breath sounds: Normal breath sounds.  Abdominal:     Palpations: Abdomen is soft.     Tenderness: There is no abdominal tenderness.  Musculoskeletal:        General: No swelling.     Cervical back: Neck supple.  Skin:    General: Skin is warm and dry.     Capillary Refill: Capillary refill takes less than 2 seconds.     Comments: Patient has significant swelling along the left fourth finger primarily on the flexor aspect.  She holds the finger in slight flexion and has significant pain with palpation and passive extension.  She has a wound to the lateral aspect, no obvious drainage.  She has significant erythema, induration with some possible fluctuance.  She has redness tracking up the finger into the hand as well as some streaking along the medial arm.  No crepitus.  Neurological:     Mental Status: She is alert.  Psychiatric:        Mood and Affect: Mood normal.          ED Results / Procedures / Treatments   Labs (all labs ordered are listed, but only abnormal results are  displayed) Labs Reviewed  CBC WITH DIFFERENTIAL/PLATELET - Abnormal; Notable for the following components:      Result Value   WBC 12.4 (*)    Platelets 141 (*)    Neutro Abs 10.0 (*)    All other components within normal limits  COMPREHENSIVE METABOLIC PANEL - Abnormal; Notable for the following components:   Glucose, Bld 168 (*)    All other components within normal limits  CULTURE, BLOOD (ROUTINE X 2)  CULTURE, BLOOD (ROUTINE X 2)  LACTIC ACID, PLASMA  LACTIC ACID, PLASMA    EKG None  Radiology DG Hand Complete Left  Result Date:  07/04/2023 CLINICAL DATA:  Left fourth finger infection. EXAM: LEFT HAND - COMPLETE 3+ VIEW COMPARISON:  Left wrist radiographs 08/16/2017 FINDINGS: There is diffuse decreased bone mineralization. Mild radiocarpal joint space narrowing. Mild degenerative cystic change at the distal radial styloid. 2 mm ulnar positive variance. Mild-to-moderate triscaphe and thumb intercarpal joint space narrowing and peripheral osteophytosis. Mild-to-moderate thumb interphalangeal and second through fifth DIP and fourth PIP joint space narrowing and peripheral osteophytosis. There is mild irregularity of the soft tissues at the lateral/radial aspect of the fourth finger at the base of the middle phalanx consistent with reported infection/wound. No definite cortical erosion is seen. IMPRESSION: 1. Mild irregularity of the soft tissues at the lateral/radial aspect of the fourth finger at the base of the middle phalanx consistent with reported infection/wound. No definite cortical erosion is seen to indicate radiographic evidence of acute osteomyelitis. 2. Mild-to-moderate osteoarthritis of the left hand and wrist. Electronically Signed   By: Neita Garnet M.D.   On: 07/04/2023 12:35    Procedures Procedures    Medications Ordered in ED Medications  piperacillin-tazobactam (ZOSYN) IVPB 3.375 g (has no administration in time range)  vancomycin (VANCOREADY) IVPB 1500 mg/300 mL  (has no administration in time range)  acetaminophen (TYLENOL) tablet 650 mg (has no administration in time range)  Tdap (BOOSTRIX) injection 0.5 mL (0.5 mLs Intramuscular Given 07/04/23 1029)  piperacillin-tazobactam (ZOSYN) IVPB 3.375 g (0 g Intravenous Stopped 07/04/23 1118)  vancomycin (VANCOCIN) IVPB 1000 mg/200 mL premix (0 mg Intravenous Stopped 07/04/23 1143)    And  vancomycin (VANCOCIN) 500 mg in sodium chloride 0.9 % 100 mL IVPB (0 mg Intravenous Stopped 07/04/23 1250)  fentaNYL (SUBLIMAZE) injection 25 mcg (25 mcg Intravenous Given 07/04/23 1027)  fentaNYL (SUBLIMAZE) injection 25 mcg (25 mcg Intravenous Given 07/04/23 1141)    ED Course/ Medical Decision Making/ A&P                                  Medical Decision Making Amount and/or Complexity of Data Reviewed Labs: ordered. Radiology: ordered.  Risk OTC drugs. Prescription drug management. Decision regarding hospitalization.   Medical Decision Making:   Allison Yoder is a 72 y.o. female who presented to the ED today with concern for left finger infection after laceration.  All signs reviewed.  She is systemically well, no fever or signs of sepsis at this time.  However she is significant infection of the finger.  I am concerned for flexor tenosynovitis given pain with passive extension, holding in flexion, tenderness along the tendon sheath and the rapid progression.  Will obtain blood cultures, lab workup and give broad-spectrum antibiotics especially as she is diabetic.  Will obtain x-ray and plan for hand consultation.   Patient placed on continuous vitals and telemetry monitoring while in ED which was reviewed periodically.  Reviewed and confirmed nursing documentation for past medical history, family history, social history.  Reassessment and Plan:   X-ray reviewed, shows an irregular soft tissue but no obvious soft tissue gas to suggest necrotizing infection low clinical suspicion at this time.  Discussed  with Charma Igo PA who discussed case with Hand MD Dr. Denese Killings, recommend [facility] medical admission for IV abx, they will evaluate there.  Discussed with Dr. Katrinka Blazing hospitalist and admitted.   Patient's presentation is most consistent with acute presentation with potential threat to life or bodily function.           Final  Clinical Impression(s) / ED Diagnoses Final diagnoses:  Cellulitis of hand    Rx / DC Orders ED Discharge Orders     None         Laurence Spates, MD 07/04/23 1456

## 2023-07-04 NOTE — Progress Notes (Signed)
   07/04/23 2009  BiPAP/CPAP/SIPAP  BiPAP/CPAP/SIPAP Resmed (HOME CPAP)    Assisted pt with home CPAP. Water added

## 2023-07-04 NOTE — ED Notes (Signed)
Report given to the floor RN.

## 2023-07-04 NOTE — Telephone Encounter (Signed)
Application completed and faxed to company 

## 2023-07-04 NOTE — ED Notes (Signed)
[facility] called for transport

## 2023-07-05 ENCOUNTER — Inpatient Hospital Stay (HOSPITAL_BASED_OUTPATIENT_CLINIC_OR_DEPARTMENT_OTHER): Payer: Medicare Other | Admitting: Anesthesiology

## 2023-07-05 ENCOUNTER — Encounter (HOSPITAL_COMMUNITY): Payer: Self-pay | Admitting: Internal Medicine

## 2023-07-05 ENCOUNTER — Other Ambulatory Visit: Payer: Self-pay

## 2023-07-05 ENCOUNTER — Encounter (HOSPITAL_COMMUNITY)
Admission: EM | Disposition: A | Discharge status: Home / Self Care | Payer: Self-pay | Source: Home / Self Care | Attending: Emergency Medicine

## 2023-07-05 ENCOUNTER — Inpatient Hospital Stay (HOSPITAL_COMMUNITY): Payer: Medicare Other | Admitting: Anesthesiology

## 2023-07-05 DIAGNOSIS — M779 Enthesopathy, unspecified: Secondary | ICD-10-CM | POA: Diagnosis not present

## 2023-07-05 DIAGNOSIS — L089 Local infection of the skin and subcutaneous tissue, unspecified: Secondary | ICD-10-CM | POA: Diagnosis not present

## 2023-07-05 DIAGNOSIS — R6510 Systemic inflammatory response syndrome (SIRS) of non-infectious origin without acute organ dysfunction: Secondary | ICD-10-CM | POA: Diagnosis not present

## 2023-07-05 DIAGNOSIS — I10 Essential (primary) hypertension: Secondary | ICD-10-CM

## 2023-07-05 DIAGNOSIS — M65842 Other synovitis and tenosynovitis, left hand: Secondary | ICD-10-CM

## 2023-07-05 HISTORY — PX: I & D EXTREMITY: SHX5045

## 2023-07-05 LAB — GLUCOSE, CAPILLARY
Glucose-Capillary: 173 mg/dL — ABNORMAL HIGH (ref 70–99)
Glucose-Capillary: 102 mg/dL — ABNORMAL HIGH (ref 70–99)
Glucose-Capillary: 116 mg/dL — ABNORMAL HIGH (ref 70–99)
Glucose-Capillary: 184 mg/dL — ABNORMAL HIGH (ref 70–99)

## 2023-07-05 LAB — BASIC METABOLIC PANEL
Anion gap: 9 (ref 5–15)
BUN: 10 mg/dL (ref 8–23)
CO2: 23 mmol/L (ref 22–32)
Calcium: 8.9 mg/dL (ref 8.9–10.3)
Chloride: 104 mmol/L (ref 98–111)
Creatinine, Ser: 0.77 mg/dL (ref 0.44–1.00)
GFR, Estimated: >60 mL/min (ref >60)
Glucose, Bld: 164 mg/dL — ABNORMAL HIGH (ref 70–99)
Potassium: 3.8 mmol/L (ref 3.5–5.1)
Sodium: 136 mmol/L (ref 135–145)

## 2023-07-05 LAB — CBC
HCT: 39.6 % (ref 36.0–46.0)
Hemoglobin: 13.2 g/dL (ref 12.0–15.0)
MCH: 29.9 pg (ref 26.0–34.0)
MCHC: 33.3 g/dL (ref 30.0–36.0)
MCV: 89.8 fL (ref 80.0–100.0)
Platelets: 113 10*3/uL — ABNORMAL LOW (ref 150–400)
RBC: 4.41 MIL/uL (ref 3.87–5.11)
RDW: 14.1 % (ref 11.5–15.5)
WBC: 7.1 10*3/uL (ref 4.0–10.5)
nRBC: 0 % (ref 0.0–0.2)

## 2023-07-05 SURGERY — IRRIGATION AND DEBRIDEMENT EXTREMITY
Anesthesia: General | Site: Hand | Laterality: Left

## 2023-07-05 MED ORDER — PHENYLEPHRINE HCL-NACL 20-0.9 MG/250ML-% IV SOLN
INTRAVENOUS | Status: DC | PRN
  Administered 2023-07-05 (×2): 80 ug via INTRAVENOUS

## 2023-07-05 MED ORDER — PROPOFOL 10 MG/ML IV BOLUS
INTRAVENOUS | Status: DC | PRN
  Administered 2023-07-05 (×2): 50 mg via INTRAVENOUS
  Administered 2023-07-05: 150 mg via INTRAVENOUS

## 2023-07-05 MED ORDER — HYDROMORPHONE HCL 1 MG/ML IJ SOLN
INTRAMUSCULAR | Status: AC
  Filled 2023-07-05: qty 1

## 2023-07-05 MED ORDER — PROPOFOL 10 MG/ML IV BOLUS
INTRAVENOUS | Status: AC
  Filled 2023-07-05: qty 20

## 2023-07-05 MED ORDER — BUPIVACAINE HCL (PF) 0.25 % IJ SOLN
INTRAMUSCULAR | Status: AC
  Filled 2023-07-05: qty 30

## 2023-07-05 MED ORDER — METRONIDAZOLE 500 MG/100ML IV SOLN
INTRAVENOUS | Status: AC
  Filled 2023-07-05: qty 100

## 2023-07-05 MED ORDER — CHLORHEXIDINE GLUCONATE 0.12 % MT SOLN
OROMUCOSAL | Status: AC
  Filled 2023-07-05: qty 15

## 2023-07-05 MED ORDER — FENTANYL CITRATE (PF) 250 MCG/5ML IJ SOLN
INTRAMUSCULAR | Status: AC
  Filled 2023-07-05: qty 5

## 2023-07-05 MED ORDER — FENTANYL CITRATE (PF) 250 MCG/5ML IJ SOLN
INTRAMUSCULAR | Status: DC | PRN
  Administered 2023-07-05: 50 ug via INTRAVENOUS
  Administered 2023-07-05: 25 ug via INTRAVENOUS
  Administered 2023-07-05 (×2): 50 ug via INTRAVENOUS

## 2023-07-05 MED ORDER — SODIUM CHLORIDE 0.9 % IV SOLN
INTRAVENOUS | Status: DC | PRN
Start: 2023-07-05 — End: 2023-07-05

## 2023-07-05 MED ORDER — AMISULPRIDE (ANTIEMETIC) 5 MG/2ML IV SOLN: 10.0000 mg | Freq: Once | INTRAVENOUS | Status: DC | PRN

## 2023-07-05 MED ORDER — LEVOFLOXACIN IN D5W 500 MG/100ML IV SOLN
500.0000 mg | INTRAVENOUS | Status: AC
  Administered 2023-07-05: 500 mg via INTRAVENOUS

## 2023-07-05 MED ORDER — ONDANSETRON HCL 4 MG/2ML IJ SOLN
INTRAMUSCULAR | Status: DC | PRN
  Administered 2023-07-05: 4 mg via INTRAVENOUS

## 2023-07-05 MED ORDER — OXYCODONE HCL 5 MG PO TABS: 5.0000 mg | ORAL_TABLET | Freq: Four times a day (QID) | ORAL | 0 refills | Status: DC | PRN

## 2023-07-05 MED ORDER — HYDROMORPHONE HCL 1 MG/ML IJ SOLN
0.2500 mg | INTRAMUSCULAR | Status: DC | PRN
  Administered 2023-07-05 (×2): 0.5 mg via INTRAVENOUS

## 2023-07-05 MED ORDER — LIDOCAINE 2% (20 MG/ML) 5 ML SYRINGE
INTRAMUSCULAR | Status: DC | PRN
  Administered 2023-07-05: 100 mg via INTRAVENOUS

## 2023-07-05 MED ORDER — BUPIVACAINE HCL (PF) 0.25 % IJ SOLN: INTRAMUSCULAR | Status: DC | PRN

## 2023-07-05 MED ORDER — METRONIDAZOLE 500 MG/100ML IV SOLN
INTRAVENOUS | Status: DC | PRN
  Administered 2023-07-05: 500 mg via INTRAVENOUS

## 2023-07-05 MED ORDER — SULFAMETHOXAZOLE-TRIMETHOPRIM 800-160 MG PO TABS: 1.0000 | ORAL_TABLET | Freq: Two times a day (BID) | ORAL | 0 refills | Status: AC

## 2023-07-05 MED ORDER — PHENYLEPHRINE 80 MCG/ML (10ML) SYRINGE FOR IV PUSH (FOR BLOOD PRESSURE SUPPORT)
PREFILLED_SYRINGE | INTRAVENOUS | Status: DC | PRN
  Administered 2023-07-05: 160 ug via INTRAVENOUS

## 2023-07-05 SURGICAL SUPPLY — 54 items
BAG COUNTER SPONGE SURGICOUNT (BAG) ×1 IMPLANT
BAG SPNG CNTER NS LX DISP (BAG) ×1
BNDG CMPR 5X4 CHSV STRCH STRL (GAUZE/BANDAGES/DRESSINGS) ×1
BNDG CMPR 5X4 KNIT ELC UNQ LF (GAUZE/BANDAGES/DRESSINGS) ×1
BNDG CMPR 75X21 PLY HI ABS (MISCELLANEOUS)
BNDG CMPR 9X4 STRL LF SNTH (GAUZE/BANDAGES/DRESSINGS) ×1
BNDG COHESIVE 4X5 TAN STRL LF (GAUZE/BANDAGES/DRESSINGS) IMPLANT
BNDG ELASTIC 4INX 5YD STR LF (GAUZE/BANDAGES/DRESSINGS) IMPLANT
BNDG ELASTIC 4X5.8 VLCR STR LF (GAUZE/BANDAGES/DRESSINGS) ×1 IMPLANT
BNDG ESMARK 4X9 LF (GAUZE/BANDAGES/DRESSINGS) IMPLANT
BNDG GAUZE DERMACEA FLUFF 4 (GAUZE/BANDAGES/DRESSINGS) ×3 IMPLANT
BNDG GZE DERMACEA 4 6PLY (GAUZE/BANDAGES/DRESSINGS) ×1
CORD BIPOLAR FORCEPS 12FT (ELECTRODE) ×1 IMPLANT
COVER SURGICAL LIGHT HANDLE (MISCELLANEOUS) ×1 IMPLANT
CUFF TOURN SGL QUICK 18X4 (TOURNIQUET CUFF) ×1 IMPLANT
CUFF TOURN SGL QUICK 24 (TOURNIQUET CUFF)
CUFF TRNQT CYL 24X4X16.5-23 (TOURNIQUET CUFF) IMPLANT
DRSG ADAPTIC 3X8 NADH LF (GAUZE/BANDAGES/DRESSINGS) ×1 IMPLANT
GAUZE SPONGE 4X4 12PLY STRL (GAUZE/BANDAGES/DRESSINGS) ×1 IMPLANT
GAUZE STRETCH 2X75IN STRL (MISCELLANEOUS) IMPLANT
GAUZE XEROFORM 1X8 LF (GAUZE/BANDAGES/DRESSINGS) ×1 IMPLANT
GAUZE XEROFORM 5X9 LF (GAUZE/BANDAGES/DRESSINGS) IMPLANT
GLOVE BIOGEL M 8.0 STRL (GLOVE) ×1 IMPLANT
GLOVE BIOGEL PI IND STRL 8 (GLOVE) IMPLANT
GLOVE SS BIOGEL STRL SZ 8 (GLOVE) ×1 IMPLANT
GOWN STRL NON-REIN LRG LVL3 (GOWN DISPOSABLE) IMPLANT
GOWN STRL REUS W/ TWL LRG LVL3 (GOWN DISPOSABLE) ×1 IMPLANT
GOWN STRL REUS W/ TWL XL LVL3 (GOWN DISPOSABLE) ×2 IMPLANT
GOWN STRL REUS W/TWL LRG LVL3 (GOWN DISPOSABLE) ×1
GOWN STRL REUS W/TWL XL LVL3 (GOWN DISPOSABLE) ×1
IV CATH 18GX1.25 SAFE RETR GRN (IV SOLUTION) IMPLANT
KIT BASIN OR (CUSTOM PROCEDURE TRAY) ×1 IMPLANT
KIT TURNOVER KIT B (KITS) ×1 IMPLANT
MANIFOLD NEPTUNE II (INSTRUMENTS) ×1 IMPLANT
NDL HYPO 25GX1X1/2 BEV (NEEDLE) IMPLANT
NEEDLE HYPO 25GX1X1/2 BEV (NEEDLE) ×1 IMPLANT
NS IRRIG 1000ML POUR BTL (IV SOLUTION) ×1 IMPLANT
PACK ORTHO EXTREMITY (CUSTOM PROCEDURE TRAY) ×1 IMPLANT
PAD ARMBOARD 7.5X6 YLW CONV (MISCELLANEOUS) ×1 IMPLANT
PAD CAST 4YDX4 CTTN HI CHSV (CAST SUPPLIES) ×1 IMPLANT
PADDING CAST COTTON 4X4 STRL (CAST SUPPLIES) ×1
SET CYSTO W/LG BORE CLAMP LF (SET/KITS/TRAYS/PACK) ×1 IMPLANT
SOL PREP POV-IOD 4OZ 10% (MISCELLANEOUS) ×2 IMPLANT
SPONGE T-LAP 4X18 ~~LOC~~+RFID (SPONGE) ×1 IMPLANT
STOCKINETTE IMPERVIOUS 9X36 MD (GAUZE/BANDAGES/DRESSINGS) IMPLANT
SUT ETHILON 4 0 PS 2 18 (SUTURE) IMPLANT
SWAB CULTURE ESWAB REG 1ML (MISCELLANEOUS) IMPLANT
SYR 30ML LL (SYRINGE) IMPLANT
SYR CONTROL 10ML LL (SYRINGE) IMPLANT
TOWEL GREEN STERILE (TOWEL DISPOSABLE) ×1 IMPLANT
TOWEL GREEN STERILE FF (TOWEL DISPOSABLE) ×1 IMPLANT
TUBE CONNECTING 12X1/4 (SUCTIONS) ×1 IMPLANT
WATER STERILE IRR 1000ML POUR (IV SOLUTION) ×1 IMPLANT
YANKAUER SUCT BULB TIP NO VENT (SUCTIONS) ×1 IMPLANT

## 2023-07-05 NOTE — Transfer of Care (Signed)
Immediate Anesthesia Transfer of Care Note  Patient: Allison Yoder  Procedure(s) Performed: IRRIGATION AND DEBRIDEMENT OF LEFT FOURTH FINGER (Left: Hand)  Patient Location: PACU  Anesthesia Type:General  Level of Consciousness: awake  Airway & Oxygen Therapy: Patient Spontanous Breathing and Patient connected to nasal cannula oxygen  Post-op Assessment: Report given to RN and Post -op Vital signs reviewed and stable  Post vital signs: Reviewed and stable  Last Vitals:  Vitals Value Taken Time  BP 151/78 07/05/23 1445  Temp 36.8 C 07/05/23 1445  Pulse 85 07/05/23 1459  Resp 16 07/05/23 1459  SpO2 95 % 07/05/23 1459  Vitals shown include unfiled device data.  Last Pain:  Vitals:   07/05/23 1445  TempSrc:   PainSc: 9       Patients Stated Pain Goal: 3 (07/04/23 1947)  Complications: No notable events documented.

## 2023-07-05 NOTE — Discharge Summary (Signed)
Physician Discharge Summary   Patient: Allison Yoder MRN: [id number] DOB: 1950/09/29  Admit date:     07/04/2023  Discharge date: 07/05/23  Discharge Physician: Meredeth Ide   PCP: Joaquim Nam, MD   Recommendations at discharge:   Follow-up with Dr. Denese Killings, hand surgeon in 5 days  Discharge Diagnoses: Principal Problem:   Infected finger Active Problems:   SIRS (systemic inflammatory response syndrome) (HCC)   Essential hypertension   Anxiety and depression  Resolved Problems:   * No resolved hospital problems. *  Hospital Course:  72 y.o. female with medical history significant of hypertension, hyperlipidemia, hypothyroidism, GERD, OSA on CPAP who presented with complaints of pain and swelling in her left ring finger.  2 days ago she was slicing bread and accidentally cut her finger.  She had raincoat water on it and dried washing it out prior to putting Betadine on it.  Yesterday she reported that the wound stopped bleeding, but after going to bed around 8 PM reported that had started throbbing.  She got up overnight and changed her dressing and put Neosporin on it.  Despite doing so patient reported increased swelling and pain in her finger.  Noted associated symptoms of fever, headache, and reports having some diarrhea.  Denies having any recent nausea, vomiting, shortness of breath, abdominal pain, or dysuria symptoms.   In the emergency department patient was noted to be febrile up to 101.6 F with vital signs otherwise relatively maintained.  Labs significant for WBC elevated 12.4, platelets 141, and lactic acid 1.8->1.7.  X-rays of the left hand noted mild irregularity of the soft tissues at the lateral/radial aspect of the 4th finger base consistent with wound infection, and without signs for osteomyelitis.  Blood cultures have been obtained as patient met SIRS criteria.  Patient had been given Tdap booster, vancomycin, Zosyn.    Assessment and Plan:  Left ring  finger flexor tenosynovitis -Status post irrigation debridement of left finger for above -Orthopedics was consulted; underwent I&D as above -Will be discharged on Bactrim for 5 days -Follow-up orthopedics as outpatient for wound check  Diabetes mellitus type 2 -Continue home regimen  Hypertension -Continue metoprolol, lisinopril  Anxiety/depression -Continue Celexa        Consultants: Orthopedics Procedures performed: I&D of left ring finger for flexor tenosynovitis Disposition: Home Diet recommendation:  Discharge Diet Orders (From admission, onward)     Start     Ordered   07/05/23 0000  Diet - low sodium heart healthy        07/05/23 1527           Carb modified diet DISCHARGE MEDICATION: Allergies as of 07/05/2023       Reactions   Cephalexin Itching   Does tolerate augmentin   Codeine Itching, Rash   Inapsine [droperidol] Shortness Of Breath, Anxiety, Hypertension   Elevated HR and BP, panic attack   Crestor [rosuvastatin]    Aches   Lipitor [atorvastatin Calcium]    Myalgias   Metformin And Related    Does not tolerate high-dose metformin due to GI symptoms.   Niacin And Related    Flushing   Pravastatin    Myalgias   Adhesive [tape] Rash   Blisters with tape   Latex Rash   When gloves are personally worn        Medication List     TAKE these medications    albuterol 108 (90 Base) MCG/ACT inhaler Commonly known as: VENTOLIN HFA INHALE 1 TO  2 PUFFS INTO THE LUNGS EVERY 6 HOURS AS NEEDED FOR WHEEZING OR SHORTNESS OF BREATH   betamethasone valerate ointment 0.1 % Commonly known as: VALISONE Apply 1 Application topically daily.   citalopram 20 MG tablet Commonly known as: CELEXA TAKE 1 TABLET(20 MG) BY MOUTH DAILY   clonazePAM 0.5 MG tablet Commonly known as: KLONOPIN Take 1 tablet (0.5 mg total) by mouth daily as needed for anxiety.   cyanocobalamin 1000 MCG tablet Take 1,000 mcg by mouth daily.   cyclobenzaprine 10 MG  tablet Commonly known as: FLEXERIL Take 1 tablet (10 mg total) by mouth daily as needed for muscle spasms.   fluticasone 50 MCG/ACT nasal spray Commonly known as: FLONASE Place 1 spray into both nostrils daily as needed for allergies or rhinitis.   Januvia 50 MG tablet Generic drug: sitaGLIPtin TAKE 1 TABLET(50 MG) BY MOUTH DAILY   lisinopril 40 MG tablet Commonly known as: ZESTRIL TAKE 1 TABLET(40 MG) BY MOUTH DAILY   metFORMIN 500 MG 24 hr tablet Commonly known as: GLUCOPHAGE-XR Take 1 tablet (500 mg total) by mouth 2 (two) times daily with a meal.   metoprolol succinate 50 MG 24 hr tablet Commonly known as: TOPROL-XL TAKE 1 TABLET BY MOUTH DAILY WITH OR IMMEDIATELY FOLLOWING A MEAL   multivitamin tablet Take 1 tablet by mouth daily.   ONE TOUCH ULTRA 2 w/Device Kit Check blood sugar once daily and as instructed. Dx E11.9   OneTouch Delica Plus Lancet33G Misc USE ONCE DAILY AS DIRECTED   OneTouch Ultra test strip Generic drug: glucose blood as directed to check blood sugar daily. Dx E11.9   oxyCODONE 5 MG immediate release tablet Commonly known as: Roxicodone Take 1 tablet (5 mg total) by mouth every 6 (six) hours as needed for severe pain (pain score 7-10).   pantoprazole 40 MG tablet Commonly known as: PROTONIX Take 1 tablet (40 mg total) by mouth daily.   Pen Needles 32G X 5 MM Misc Use with insulin pen   Repatha SureClick 140 MG/ML Soaj Generic drug: Evolocumab Inject 140 mg into the skin every 14 (fourteen) days.   sulfamethoxazole-trimethoprim 800-160 MG tablet Commonly known as: BACTRIM DS Take 1 tablet by mouth 2 (two) times daily for 5 days.   Lantus SoloStar 100 UNIT/ML Solostar Pen Generic drug: insulin glargine Inject 50 Units into the skin daily. Sliding scale 52-54 units   Toujeo SoloStar 300 UNIT/ML Solostar Pen Generic drug: insulin glargine (1 Unit Dial) Inject 50 Units into the skin daily.   Vitamin D (Cholecalciferol) 25 MCG (1000  UT) Caps Take 3,000 Units by mouth daily.        Follow-up Information     Agarwala, Anshul, MD. Schedule an appointment as soon as possible for a visit on 07/10/2023.   Specialty: Orthopedic Surgery Why: For wound re-check Contact information: [address] [place] [address] [phone]                Discharge Exam: Ceasar Mons Weights   07/04/23 0909 07/05/23 1323  Weight: 83.9 kg 38.1 kg   General-appears in no acute distress Heart-S1-S2, regular, no murmur auscultated Lungs-clear to auscultation bilaterally, no wheezing or crackles auscultated Abdomen-soft, nontender, no organomegaly Extremities-no edema in the lower extremities Neuro-alert, oriented x3, no focal deficit noted  Condition at discharge: good  The results of significant diagnostics from this hospitalization (including imaging, microbiology, ancillary and laboratory) are listed below for reference.   Imaging Studies: DG Hand Complete Left  Result Date: 07/04/2023 CLINICAL DATA:  Left fourth finger infection. EXAM: LEFT HAND - COMPLETE 3+ VIEW COMPARISON:  Left wrist radiographs 08/16/2017 FINDINGS: There is diffuse decreased bone mineralization. Mild radiocarpal joint space narrowing. Mild degenerative cystic change at the distal radial styloid. 2 mm ulnar positive variance. Mild-to-moderate triscaphe and thumb intercarpal joint space narrowing and peripheral osteophytosis. Mild-to-moderate thumb interphalangeal and second through fifth DIP and fourth PIP joint space narrowing and peripheral osteophytosis. There is mild irregularity of the soft tissues at the lateral/radial aspect of the fourth finger at the base of the middle phalanx consistent with reported infection/wound. No definite cortical erosion is seen. IMPRESSION: 1. Mild irregularity of the soft tissues at the lateral/radial aspect of the fourth finger at the base of the middle phalanx consistent with reported infection/wound. No definite  cortical erosion is seen to indicate radiographic evidence of acute osteomyelitis. 2. Mild-to-moderate osteoarthritis of the left hand and wrist. Electronically Signed   By: Neita Garnet M.D.   On: 07/04/2023 12:35    Microbiology: Results for orders placed or performed during the hospital encounter of 07/04/23  Blood culture (routine x 2)     Status: None (Preliminary result)   Collection Time: 07/04/23  9:30 AM   Specimen: BLOOD  Result Value Ref Range Status   Specimen Description   Final    BLOOD BLOOD RIGHT FOREARM Performed at [facility], [address] [place], [place] [address]    Special Requests   Final    BOTTLES DRAWN AEROBIC AND ANAEROBIC Blood Culture adequate volume Performed at [facility], [address] [place], [place] [address]    Culture   Final    NO GROWTH < 24 HOURS Performed at [facility], [address], [place], [place] [address]    Report Status PENDING  Incomplete  Blood culture (routine x 2)     Status: None (Preliminary result)   Collection Time: 07/04/23 10:15 AM   Specimen: BLOOD  Result Value Ref Range Status   Specimen Description   Final    BLOOD LEFT ANTECUBITAL Performed at [facility], [address] [place], [place] [address]    Special Requests   Final    BOTTLES DRAWN AEROBIC AND ANAEROBIC Blood Culture results may not be optimal due to an excessive volume of blood received in culture bottles Performed at [facility], [address] [place], [place] [address]    Culture   Final    NO GROWTH < 24 HOURS Performed at [facility], [address], [place], [place] [address]    Report Status PENDING  Incomplete    Labs: CBC: Recent Labs  Lab 07/04/23 0930 07/05/23 0515  WBC 12.4* 7.1  NEUTROABS 10.0*  --   HGB 14.2 13.2  HCT 41.1 39.6  MCV 88.4 89.8  PLT 141* 113*   Basic Metabolic Panel: Recent Labs  Lab 07/04/23 0930  07/05/23 0515  NA 135 136  K 3.8 3.8  CL 102 104  CO2 26 23  GLUCOSE 168* 164*  BUN 13 10  CREATININE 0.70 0.77  CALCIUM 9.8 8.9   Liver Function Tests: Recent Labs  Lab 07/04/23 0930  AST 31  ALT 36  ALKPHOS 99  BILITOT 1.0  PROT 7.4  ALBUMIN 4.6   CBG: Recent Labs  Lab 07/04/23 1558 07/04/23 2158 07/05/23 0750 07/05/23 1144 07/05/23 1447  GLUCAP 114* 204* 173* 102* 116*    Discharge time spent: greater than 30 minutes.  Signed: Sarina Ill  Sharl Ma, MD [facility] 07/05/2023

## 2023-07-05 NOTE — Plan of Care (Signed)
  Problem: Clinical Measurements: Goal: Ability to maintain clinical measurements within normal limits will improve Outcome: Progressing   Problem: Clinical Measurements: Goal: Diagnostic test results will improve Outcome: Progressing   Problem: Activity: Goal: Risk for activity intolerance will decrease Outcome: Progressing   Problem: Pain Management: Goal: General experience of comfort will improve Outcome: Progressing   Problem: Safety: Goal: Ability to remain free from injury will improve Outcome: Progressing

## 2023-07-05 NOTE — Anesthesia Procedure Notes (Signed)
Procedure Name: LMA Insertion Date/Time: 07/05/2023 1:42 PM  Performed by: Loleta Tolbert Matheson, CRNAPre-anesthesia Checklist: Patient identified, Patient being monitored, Timeout performed, Emergency Drugs available and Suction available Patient Re-evaluated:Patient Re-evaluated prior to induction Oxygen Delivery Method: Circle system utilized Preoxygenation: Pre-oxygenation with 100% oxygen Induction Type: IV induction Ventilation: Mask ventilation without difficulty LMA: LMA inserted LMA Size: 4.0 Tube type: Oral Number of attempts: 1 Placement Confirmation: positive ETCO2 and breath sounds checked- equal and bilateral Tube secured with: Tape Dental Injury: Teeth and Oropharynx as per pre-operative assessment

## 2023-07-05 NOTE — Anesthesia Preprocedure Evaluation (Addendum)
Anesthesia Evaluation  Patient identified by MRN, date of birth, ID band Patient awake    Reviewed: Allergy & Precautions, NPO status , Patient's Chart, lab work & pertinent test results, reviewed documented beta blocker date and time   History of Anesthesia Complications Negative for: history of anesthetic complications  Airway Mallampati: III  TM Distance: >3 FB Neck ROM: Full    Dental no notable dental hx.    Pulmonary sleep apnea and Continuous Positive Airway Pressure Ventilation , former smoker   Pulmonary exam normal        Cardiovascular hypertension, Pt. on medications and Pt. on home beta blockers Normal cardiovascular exam     Neuro/Psych  Headaches  Anxiety Depression    CVA, No Residual Symptoms    GI/Hepatic ,GERD  Controlled,,NASH   Endo/Other  diabetes, Type 2, Insulin Dependent, Oral Hypoglycemic AgentsHypothyroidism    Renal/GU negative Renal ROS     Musculoskeletal  (+) Arthritis ,    Abdominal   Peds  Hematology negative hematology ROS (+)   Anesthesia Other Findings Day of surgery medications reviewed with patient.  Reproductive/Obstetrics                             Anesthesia Physical Anesthesia Plan  ASA: 2  Anesthesia Plan: General   Post-op Pain Management: Tylenol PO (pre-op)*   Induction: Intravenous  PONV Risk Score and Plan: 3 and Treatment may vary due to age or medical condition, Ondansetron and Dexamethasone  Airway Management Planned: LMA  Additional Equipment: None  Intra-op Plan:   Post-operative Plan: Extubation in OR  Informed Consent: I have reviewed the patients History and Physical, chart, labs and discussed the procedure including the risks, benefits and alternatives for the proposed anesthesia with the patient or authorized representative who has indicated his/her understanding and acceptance.     Dental advisory given  Plan  Discussed with: CRNA  Anesthesia Plan Comments:         Anesthesia Quick Evaluation

## 2023-07-05 NOTE — H&P (View-Only) (Signed)
HAND SURGERY CONSULTATION  REQUESTING PHYSICIAN: Meredeth Ide, MD   Chief Complaint: Left ring finger infection  HPI: Allison Yoder is a 72 y.o. female who presents with ongoing pain and swelling of the left ring finger.  She cut it with a knife 2 days prior, was initially doing fine and then subsequently had swelling and throbbing.  She was seen at an outside emergency department yesterday with concern for potential developing flexor tenosynovitis.  She was subsequently transferred to [facility] for ongoing IV antibiotics and evaluation by hand surgery.  Currently, she has ongoing pain at the ring finger which is continued to persist, does have some swelling throughout the finger as well as streaking up the arm.  Hand dominance: Right   Past Medical History:  Diagnosis Date   Allergy    Anemia    "all the time as a child"   Anxiety    Arthritis    "all my joints; worse in my hands" (10/27/2015)   Chronic lower back pain    Depression    Diverticulosis    Fatty liver    GERD (gastroesophageal reflux disease)    Grade I diastolic dysfunction    Heart murmur    "dx'd years ago; very mild; never treated" (10/27/2015)   Hyperlipidemia    Hypertension    Hypothyroidism 2002-~ 2010   Iritis    per [facility]   Migraines    "stopped when I went thru menopause"   NASH (nonalcoholic steatohepatitis)    OSA on CPAP    Pneumonia ~ 2013; 10/27/2015   Recurrent urinary tract infection    Routine general medical examination at a health care facility 02/26/2014   Snores    Stroke (HCC) 2017   TIAs x2.  No deficits   Type II diabetes mellitus (HCC)    "lost weight; started exercising again; don't have it anymore" (10/27/2015)   Urinary incontinence    Wears glasses    Wears hearing aid in both ears    Past Surgical History:  Procedure Laterality Date   BLEPHAROPLASTY Bilateral 2013; 2016   BREAST LUMPECTOMY Right 1964   benign tumor,   CATARACT EXTRACTION  W/PHACO Right 01/31/2023   Procedure: CATARACT EXTRACTION PHACO AND INTRAOCULAR LENS PLACEMENT (IOC) RIGHT DIABETIC  5.04  00:36.2;  Surgeon: Galen Manila, MD;  Location: [facility];  Service: Ophthalmology;  Laterality: Right;  Diabetic   CATARACT EXTRACTION W/PHACO Left 02/14/2023   Procedure: CATARACT EXTRACTION PHACO AND INTRAOCULAR LENS PLACEMENT (IOC) LEFT DIABETIC  5.73  00:40.1;  Surgeon: Galen Manila, MD;  Location: [facility];  Service: Ophthalmology;  Laterality: Left;  Diabetic   HEMORRHOID BANDING  X 2   KNEE ARTHROSCOPY Left 2016   "meniscus repair"   PELVIC FLOOR REPAIR  2001   "lift"   POLYPECTOMY  X 3   "bladder"   SHOULDER ARTHROSCOPY W/ ROTATOR CUFF REPAIR Left 2013   SHOULDER ARTHROSCOPY W/ ROTATOR CUFF REPAIR Right 2015   TUBAL LIGATION  ~ 1986   VAGINAL HYSTERECTOMY  1992   Partial   Social History   Socioeconomic History   Marital status: Divorced    Spouse name: Not on file   Number of children: 1   Years of education: Not on file   Highest education level: Not on file  Occupational History   Occupation: Dental Receptionist/Assistant    Comment: Dr. Reuel Boom in [place]  Tobacco Use   Smoking status: Former  Current packs/day: 0.00    Average packs/day: 2.0 packs/day for 5.0 years (10.0 ttl pk-yrs)    Types: Cigarettes    Start date: 09/12/1980    Quit date: 09/12/1985    Years since quitting: 37.8   Smokeless tobacco: Never  Vaping Use   Vaping status: Never Used  Substance and Sexual Activity   Alcohol use: Not Currently   Drug use: No   Sexual activity: Yes  Other Topics Concern   Not on file  Social History Narrative   Education:  Medical sales representative   Divorced and lives with her brother.     Caring for her grandson Jean Rosenthal) as of 2023   Social Determinants of Health   Financial Resource Strain: Medium Risk (09/09/2022)   Overall Financial Resource Strain (CARDIA)    Difficulty of Paying Living Expenses:  Somewhat hard  Food Insecurity: No Food Insecurity (07/04/2023)   Hunger Vital Sign    Worried About Running Out of Food in the Last Year: Never true    Ran Out of Food in the Last Year: Never true  Transportation Needs: No Transportation Needs (07/04/2023)   PRAPARE - Administrator, Civil Service (Medical): No    Lack of Transportation (Non-Medical): No  Physical Activity: Inactive (09/09/2022)   Exercise Vital Sign    Days of Exercise per Week: 0 days    Minutes of Exercise per Session: 0 min  Stress: Stress Concern Present (09/09/2022)   [employer] of Occupational Health - Occupational Stress Questionnaire    Feeling of Stress : To some extent  Social Connections: Unknown (09/09/2022)   Social Connection and Isolation Panel [NHANES]    Frequency of Communication with Friends and Family: More than three times a week    Frequency of Social Gatherings with Friends and Family: Once a week    Attends Religious Services: Not on Marketing executive or Organizations: No    Attends Banker Meetings: Never    Marital Status: Divorced   Family History  Problem Relation Age of Onset   Arthritis Mother    Heart disease Mother    Hyperlipidemia Mother    Hypertension Mother    Diabetes Mother    Depression Mother    Stroke Mother    Alcohol abuse Father    Lung cancer Father    Arthritis Maternal Grandmother    Stroke Maternal Grandmother    Diabetes Maternal Grandmother    Heart disease Paternal Uncle        x 2   Arthritis Sister    Breast cancer Cousin    Heart disease Paternal Aunt    Diabetes Paternal Uncle    Diabetes Sister    Heart disease Paternal Uncle        x 5   Heart disease Paternal Aunt        x 3   Stroke Paternal Aunt    Dementia Paternal Grandmother    Colon cancer Neg Hx    - negative except otherwise stated in the family history section Allergies  Allergen Reactions   Cephalexin Itching    Does tolerate  augmentin   Codeine Itching and Rash   Inapsine [Droperidol] Shortness Of Breath, Anxiety and Hypertension    Elevated HR and BP, panic attack   Crestor [Rosuvastatin]     Aches   Lipitor [Atorvastatin Calcium]     Myalgias    Metformin And Related  Does not tolerate high-dose metformin due to GI symptoms.   Niacin And Related     Flushing   Pravastatin     Myalgias   Adhesive [Tape] Rash    Blisters with tape   Latex Rash    When gloves are personally worn   Prior to Admission medications   Medication Sig Start Date End Date Taking? Authorizing Provider  albuterol (VENTOLIN HFA) 108 (90 Base) MCG/ACT inhaler INHALE 1 TO 2 PUFFS INTO THE LUNGS EVERY 6 HOURS AS NEEDED FOR WHEEZING OR SHORTNESS OF BREATH 01/19/22  Yes Dugal, Tabitha, FNP  betamethasone valerate ointment (VALISONE) 0.1 % Apply 1 Application topically daily. 05/12/23  Yes [provider]  citalopram (CELEXA) 20 MG tablet TAKE 1 TABLET(20 MG) BY MOUTH DAILY 11/07/22  Yes Joaquim Nam, MD  clonazePAM (KLONOPIN) 0.5 MG tablet Take 1 tablet (0.5 mg total) by mouth daily as needed for anxiety. 11/16/21  Yes Joaquim Nam, MD  cyanocobalamin 1000 MCG tablet Take 1,000 mcg by mouth daily.   Yes [provider]  cyclobenzaprine (FLEXERIL) 10 MG tablet Take 1 tablet (10 mg total) by mouth daily as needed for muscle spasms. 09/03/19  Yes Joaquim Nam, MD  Evolocumab (REPATHA SURECLICK) 140 MG/ML SOAJ Inject 140 mg into the skin every 14 (fourteen) days. 08/01/22  Yes Wendall Stade, MD  fluticasone (FLONASE) 50 MCG/ACT nasal spray Place 1 spray into both nostrils daily as needed for allergies or rhinitis.   Yes [provider]  insulin glargine (LANTUS SOLOSTAR) 100 UNIT/ML Solostar Pen Inject 50 Units into the skin daily. Sliding scale 52-54 units   Yes [provider]  JANUVIA 50 MG tablet TAKE 1 TABLET(50 MG) BY MOUTH DAILY 10/28/22  Yes Joaquim Nam, MD  lisinopril (ZESTRIL) 40  MG tablet TAKE 1 TABLET(40 MG) BY MOUTH DAILY 10/28/22  Yes Joaquim Nam, MD  metFORMIN (GLUCOPHAGE-XR) 500 MG 24 hr tablet Take 1 tablet (500 mg total) by mouth 2 (two) times daily with a meal. 05/26/23  Yes Joaquim Nam, MD  metoprolol succinate (TOPROL-XL) 50 MG 24 hr tablet TAKE 1 TABLET BY MOUTH DAILY WITH OR IMMEDIATELY FOLLOWING A MEAL 10/28/22  Yes Joaquim Nam, MD  Multiple Vitamin (MULTIVITAMIN) tablet Take 1 tablet by mouth daily.   Yes [provider]  pantoprazole (PROTONIX) 40 MG tablet Take 1 tablet (40 mg total) by mouth daily. 05/17/23  Yes Almon Hercules, MD  Vitamin D, Cholecalciferol, 1000 units CAPS Take 3,000 Units by mouth daily.   Yes [provider]  Blood Glucose Monitoring Suppl (ONE TOUCH ULTRA 2) w/Device KIT Check blood sugar once daily and as instructed. Dx E11.9 12/29/16   Joaquim Nam, MD  glucose blood (ONETOUCH ULTRA) test strip as directed to check blood sugar daily. Dx E11.9 11/10/22   Joaquim Nam, MD  insulin glargine, 1 Unit Dial, (TOUJEO SOLOSTAR) 300 UNIT/ML Solostar Pen Inject 50 Units into the skin daily. Patient not taking: Reported on 07/04/2023 06/07/23   Joaquim Nam, MD  Insulin Pen Needle (PEN NEEDLES) 32G X 5 MM MISC Use with insulin pen 01/05/23   Joaquim Nam, MD  Lancets [facility] DELICA PLUS [place]) MISC USE ONCE DAILY AS DIRECTED 11/15/22   Joaquim Nam, MD   DG Hand Complete Left  Result Date: 07/04/2023 CLINICAL DATA:  Left fourth finger infection. EXAM: LEFT HAND - COMPLETE 3+ VIEW COMPARISON:  Left wrist radiographs 08/16/2017 FINDINGS: There is diffuse decreased  bone mineralization. Mild radiocarpal joint space narrowing. Mild degenerative cystic change at the distal radial styloid. 2 mm ulnar positive variance. Mild-to-moderate triscaphe and thumb intercarpal joint space narrowing and peripheral osteophytosis. Mild-to-moderate thumb interphalangeal and second through fifth DIP and fourth PIP joint  space narrowing and peripheral osteophytosis. There is mild irregularity of the soft tissues at the lateral/radial aspect of the fourth finger at the base of the middle phalanx consistent with reported infection/wound. No definite cortical erosion is seen. IMPRESSION: 1. Mild irregularity of the soft tissues at the lateral/radial aspect of the fourth finger at the base of the middle phalanx consistent with reported infection/wound. No definite cortical erosion is seen to indicate radiographic evidence of acute osteomyelitis. 2. Mild-to-moderate osteoarthritis of the left hand and wrist. Electronically Signed   By: Neita Garnet M.D.   On: 07/04/2023 12:35   - Positive ROS: All other systems have been reviewed and were otherwise negative with the exception of those mentioned in the HPI and as above.  Physical Exam: General: No acute distress, resting comfortably Cardiovascular: BUE warm and well perfused, normal rate Respiratory: Normal WOB on RA Skin: Warm and dry Neurologic: Sensation intact distally Psychiatric: Patient is at baseline mood and affect  Left upper Extremity  Left hand with pain volarly along the ring finger flexor sheath, pain with passive extension of the ring finger and notable swelling diffusely throughout the ring finger  Mild streaking up the forearm as well    Assessment/Plan: Given her ongoing symptoms despite IV antibiotic treatment overnight, we will proceed with surgical washout of the left ring finger with irrigation and debridement for concern for ongoing flexor tenosynovitis  Risks and benefits of the procedure were discussed, risks including but not limited to infection, bleeding, scarring, stiffness, nerve injury, tendon injury, vascular injury, hardware complication if utilized, recurrence of symptoms and need for subsequent operation.  Patient expressed understanding.    Likely will be able to be discharged after surgery today Keep n.p.o.   Thank you for  the consult and the opportunity to see Ms. Raatz  Jerriann Schrom Trevor Mace, M.D. [facility] 8:35 AM

## 2023-07-05 NOTE — Progress Notes (Signed)
Transition of Care [facility]) - Inpatient Brief Assessment   Patient Details  Name: Allison Yoder MRN: [id number] Date of Birth: 03/28/1951  [facility] CM/SW Contact:    Janae Bridgeman, RN Phone Number: 07/05/2023, 3:45 PM   Clinical Narrative: CM met with the patient at the bedside post-surgery after PACU stay and patient plans to discharge home with her brother this evening.   The patient was alert and able to answer appropriately and was provided with Medicare Observation notice and Code 44 information.  The patient lives with her brother at the home and the brother plans to provide transportation to the home via car.  Brother is not present at the bedside at this time.  DME at the home includes CPAP machine  - which is present in the hospital room at this time.  Patient has nasal cannula oxygen at this time but will be weaned to RA before returning home.  No other TOC needs at this time.  Patient left arm is wrapped with ACe wrap post surgery and patient has it elevated on a pillow at this time.  Patient will follow up with surgeon post-surgery.   Transition of Care Asessment: Insurance and Status: (P) Insurance coverage has been reviewed Patient has primary care physician: (P) Yes Home environment has been reviewed: (P) From home with brother Prior level of function:: (P) Independent Prior/Current Home Services: (P) No current home services Social Determinants of Health Reivew: (P) SDOH reviewed interventions complete Readmission risk has been reviewed: (P) Yes Transition of care needs: (P) transition of care needs identified, TOC will continue to follow

## 2023-07-05 NOTE — Care Management CC44 (Cosign Needed)
Condition Code 44 Documentation Completed  Patient Details  Name: Allison Yoder MRN: [id number] Date of Birth: May 09, 1951   Condition Code 44 given:  Yes Patient signature on Condition Code 44 notice:  Yes Documentation of 2 MD's agreement:  Yes Code 44 added to claim:  Yes    Janae Bridgeman, RN 07/05/2023, 3:37 PM

## 2023-07-05 NOTE — Interval H&P Note (Signed)
History and Physical Interval Note:  07/05/2023 1:16 PM  Allison Yoder  has presented today for surgery, with the diagnosis of Left Ring Flexor Tendonistis.  The various methods of treatment have been discussed with the patient and family. After consideration of risks, benefits and other options for treatment, the patient has consented to  Procedure(s): IRRIGATION AND DEBRIDEMENT EXTREMITY (Left) as a surgical intervention.  The patient's history has been reviewed, patient examined, no change in status, stable for surgery.  I have reviewed the patient's chart and labs.  Questions were answered to the patient's satisfaction.     Lylia Karn

## 2023-07-05 NOTE — Discharge Instructions (Signed)
    Hand Surgery Postop Instructions   Dressings: Maintain postoperative dressing until orthopedic follow-up.  Keep operative site clean and dry until orthopedic follow-up.  Wound Care: Keep your hand elevated above the level of your heart.  Do not allow it to dangle by your side. Moving your fingers is advised to stimulate circulation but will depend on the site of your surgery.  If you have a splint applied, your doctor will advise you regarding movement.  Activity: Do not drive or operate machinery until clearance given from physician. No heavy lifting with operative extremity.  Diet:  Drink liquids today or eat a light diet.  You may resume a regular diet tomorrow.    General expectations: Take prescribed medication if given, transition to over-the-counter medication as quickly as possible. Fingers may become slightly swollen.  Call your doctor if any of the following occur: Severe pain not relieved by pain medication. Elevated temperature. Dressing soaked with blood. Inability to move fingers. White or bluish color to fingers.   Divon Krabill Trevor Mace, M.D. Hand Surgery [facility]

## 2023-07-05 NOTE — Plan of Care (Signed)

## 2023-07-05 NOTE — Anesthesia Postprocedure Evaluation (Signed)
Anesthesia Post Note  Patient: Software engineer  Procedure(s) Performed: IRRIGATION AND DEBRIDEMENT OF LEFT FOURTH FINGER (Left: Hand)     Patient location during evaluation: PACU Anesthesia Type: General Level of consciousness: awake and alert Pain management: pain level controlled Vital Signs Assessment: post-procedure vital signs reviewed and stable Respiratory status: spontaneous breathing, nonlabored ventilation and respiratory function stable Cardiovascular status: blood pressure returned to baseline Postop Assessment: no apparent nausea or vomiting Anesthetic complications: no   No notable events documented.  Last Vitals:  Vitals:   07/05/23 1500 07/05/23 1515  BP: (!) 155/81   Pulse: 85 84  Resp: 16 12  Temp:    SpO2: 95% 93%    Last Pain:  Vitals:   07/05/23 1515  TempSrc:   PainSc: Asleep                 Shanda Howells

## 2023-07-05 NOTE — Consult Note (Signed)
HAND SURGERY CONSULTATION  REQUESTING PHYSICIAN: Meredeth Ide, MD   Chief Complaint: Left ring finger infection  HPI: Allison Yoder is a 72 y.o. female who presents with ongoing pain and swelling of the left ring finger.  She cut it with a knife 2 days prior, was initially doing fine and then subsequently had swelling and throbbing.  She was seen at an outside emergency department yesterday with concern for potential developing flexor tenosynovitis.  She was subsequently transferred to [facility] for ongoing IV antibiotics and evaluation by hand surgery.  Currently, she has ongoing pain at the ring finger which is continued to persist, does have some swelling throughout the finger as well as streaking up the arm.  Hand dominance: Right   Past Medical History:  Diagnosis Date   Allergy    Anemia    "all the time as a child"   Anxiety    Arthritis    "all my joints; worse in my hands" (10/27/2015)   Chronic lower back pain    Depression    Diverticulosis    Fatty liver    GERD (gastroesophageal reflux disease)    Grade I diastolic dysfunction    Heart murmur    "dx'd years ago; very mild; never treated" (10/27/2015)   Hyperlipidemia    Hypertension    Hypothyroidism 2002-~ 2010   Iritis    per [facility]   Migraines    "stopped when I went thru menopause"   NASH (nonalcoholic steatohepatitis)    OSA on CPAP    Pneumonia ~ 2013; 10/27/2015   Recurrent urinary tract infection    Routine general medical examination at a health care facility 02/26/2014   Snores    Stroke (HCC) 2017   TIAs x2.  No deficits   Type II diabetes mellitus (HCC)    "lost weight; started exercising again; don't have it anymore" (10/27/2015)   Urinary incontinence    Wears glasses    Wears hearing aid in both ears    Past Surgical History:  Procedure Laterality Date   BLEPHAROPLASTY Bilateral 2013; 2016   BREAST LUMPECTOMY Right 1964   benign tumor,   CATARACT EXTRACTION  W/PHACO Right 01/31/2023   Procedure: CATARACT EXTRACTION PHACO AND INTRAOCULAR LENS PLACEMENT (IOC) RIGHT DIABETIC  5.04  00:36.2;  Surgeon: Galen Manila, MD;  Location: [facility];  Service: Ophthalmology;  Laterality: Right;  Diabetic   CATARACT EXTRACTION W/PHACO Left 02/14/2023   Procedure: CATARACT EXTRACTION PHACO AND INTRAOCULAR LENS PLACEMENT (IOC) LEFT DIABETIC  5.73  00:40.1;  Surgeon: Galen Manila, MD;  Location: [facility];  Service: Ophthalmology;  Laterality: Left;  Diabetic   HEMORRHOID BANDING  X 2   KNEE ARTHROSCOPY Left 2016   "meniscus repair"   PELVIC FLOOR REPAIR  2001   "lift"   POLYPECTOMY  X 3   "bladder"   SHOULDER ARTHROSCOPY W/ ROTATOR CUFF REPAIR Left 2013   SHOULDER ARTHROSCOPY W/ ROTATOR CUFF REPAIR Right 2015   TUBAL LIGATION  ~ 1986   VAGINAL HYSTERECTOMY  1992   Partial   Social History   Socioeconomic History   Marital status: Divorced    Spouse name: Not on file   Number of children: 1   Years of education: Not on file   Highest education level: Not on file  Occupational History   Occupation: Dental Receptionist/Assistant    Comment: Dr. Reuel Boom in [place]  Tobacco Use   Smoking status: Former  Current packs/day: 0.00    Average packs/day: 2.0 packs/day for 5.0 years (10.0 ttl pk-yrs)    Types: Cigarettes    Start date: 09/12/1980    Quit date: 09/12/1985    Years since quitting: 37.8   Smokeless tobacco: Never  Vaping Use   Vaping status: Never Used  Substance and Sexual Activity   Alcohol use: Not Currently   Drug use: No   Sexual activity: Yes  Other Topics Concern   Not on file  Social History Narrative   Education:  Medical sales representative   Divorced and lives with her brother.     Caring for her grandson Jean Rosenthal) as of 2023   Social Determinants of Health   Financial Resource Strain: Medium Risk (09/09/2022)   Overall Financial Resource Strain (CARDIA)    Difficulty of Paying Living Expenses:  Somewhat hard  Food Insecurity: No Food Insecurity (07/04/2023)   Hunger Vital Sign    Worried About Running Out of Food in the Last Year: Never true    Ran Out of Food in the Last Year: Never true  Transportation Needs: No Transportation Needs (07/04/2023)   PRAPARE - Administrator, Civil Service (Medical): No    Lack of Transportation (Non-Medical): No  Physical Activity: Inactive (09/09/2022)   Exercise Vital Sign    Days of Exercise per Week: 0 days    Minutes of Exercise per Session: 0 min  Stress: Stress Concern Present (09/09/2022)   [employer] of Occupational Health - Occupational Stress Questionnaire    Feeling of Stress : To some extent  Social Connections: Unknown (09/09/2022)   Social Connection and Isolation Panel [NHANES]    Frequency of Communication with Friends and Family: More than three times a week    Frequency of Social Gatherings with Friends and Family: Once a week    Attends Religious Services: Not on Marketing executive or Organizations: No    Attends Banker Meetings: Never    Marital Status: Divorced   Family History  Problem Relation Age of Onset   Arthritis Mother    Heart disease Mother    Hyperlipidemia Mother    Hypertension Mother    Diabetes Mother    Depression Mother    Stroke Mother    Alcohol abuse Father    Lung cancer Father    Arthritis Maternal Grandmother    Stroke Maternal Grandmother    Diabetes Maternal Grandmother    Heart disease Paternal Uncle        x 2   Arthritis Sister    Breast cancer Cousin    Heart disease Paternal Aunt    Diabetes Paternal Uncle    Diabetes Sister    Heart disease Paternal Uncle        x 5   Heart disease Paternal Aunt        x 3   Stroke Paternal Aunt    Dementia Paternal Grandmother    Colon cancer Neg Hx    - negative except otherwise stated in the family history section Allergies  Allergen Reactions   Cephalexin Itching    Does tolerate  augmentin   Codeine Itching and Rash   Inapsine [Droperidol] Shortness Of Breath, Anxiety and Hypertension    Elevated HR and BP, panic attack   Crestor [Rosuvastatin]     Aches   Lipitor [Atorvastatin Calcium]     Myalgias    Metformin And Related  Does not tolerate high-dose metformin due to GI symptoms.   Niacin And Related     Flushing   Pravastatin     Myalgias   Adhesive [Tape] Rash    Blisters with tape   Latex Rash    When gloves are personally worn   Prior to Admission medications   Medication Sig Start Date End Date Taking? Authorizing Provider  albuterol (VENTOLIN HFA) 108 (90 Base) MCG/ACT inhaler INHALE 1 TO 2 PUFFS INTO THE LUNGS EVERY 6 HOURS AS NEEDED FOR WHEEZING OR SHORTNESS OF BREATH 01/19/22  Yes Dugal, Tabitha, FNP  betamethasone valerate ointment (VALISONE) 0.1 % Apply 1 Application topically daily. 05/12/23  Yes [provider]  citalopram (CELEXA) 20 MG tablet TAKE 1 TABLET(20 MG) BY MOUTH DAILY 11/07/22  Yes Joaquim Nam, MD  clonazePAM (KLONOPIN) 0.5 MG tablet Take 1 tablet (0.5 mg total) by mouth daily as needed for anxiety. 11/16/21  Yes Joaquim Nam, MD  cyanocobalamin 1000 MCG tablet Take 1,000 mcg by mouth daily.   Yes [provider]  cyclobenzaprine (FLEXERIL) 10 MG tablet Take 1 tablet (10 mg total) by mouth daily as needed for muscle spasms. 09/03/19  Yes Joaquim Nam, MD  Evolocumab (REPATHA SURECLICK) 140 MG/ML SOAJ Inject 140 mg into the skin every 14 (fourteen) days. 08/01/22  Yes Wendall Stade, MD  fluticasone (FLONASE) 50 MCG/ACT nasal spray Place 1 spray into both nostrils daily as needed for allergies or rhinitis.   Yes [provider]  insulin glargine (LANTUS SOLOSTAR) 100 UNIT/ML Solostar Pen Inject 50 Units into the skin daily. Sliding scale 52-54 units   Yes [provider]  JANUVIA 50 MG tablet TAKE 1 TABLET(50 MG) BY MOUTH DAILY 10/28/22  Yes Joaquim Nam, MD  lisinopril (ZESTRIL) 40  MG tablet TAKE 1 TABLET(40 MG) BY MOUTH DAILY 10/28/22  Yes Joaquim Nam, MD  metFORMIN (GLUCOPHAGE-XR) 500 MG 24 hr tablet Take 1 tablet (500 mg total) by mouth 2 (two) times daily with a meal. 05/26/23  Yes Joaquim Nam, MD  metoprolol succinate (TOPROL-XL) 50 MG 24 hr tablet TAKE 1 TABLET BY MOUTH DAILY WITH OR IMMEDIATELY FOLLOWING A MEAL 10/28/22  Yes Joaquim Nam, MD  Multiple Vitamin (MULTIVITAMIN) tablet Take 1 tablet by mouth daily.   Yes [provider]  pantoprazole (PROTONIX) 40 MG tablet Take 1 tablet (40 mg total) by mouth daily. 05/17/23  Yes Almon Hercules, MD  Vitamin D, Cholecalciferol, 1000 units CAPS Take 3,000 Units by mouth daily.   Yes [provider]  Blood Glucose Monitoring Suppl (ONE TOUCH ULTRA 2) w/Device KIT Check blood sugar once daily and as instructed. Dx E11.9 12/29/16   Joaquim Nam, MD  glucose blood (ONETOUCH ULTRA) test strip as directed to check blood sugar daily. Dx E11.9 11/10/22   Joaquim Nam, MD  insulin glargine, 1 Unit Dial, (TOUJEO SOLOSTAR) 300 UNIT/ML Solostar Pen Inject 50 Units into the skin daily. Patient not taking: Reported on 07/04/2023 06/07/23   Joaquim Nam, MD  Insulin Pen Needle (PEN NEEDLES) 32G X 5 MM MISC Use with insulin pen 01/05/23   Joaquim Nam, MD  Lancets [facility] DELICA PLUS [place]) MISC USE ONCE DAILY AS DIRECTED 11/15/22   Joaquim Nam, MD   DG Hand Complete Left  Result Date: 07/04/2023 CLINICAL DATA:  Left fourth finger infection. EXAM: LEFT HAND - COMPLETE 3+ VIEW COMPARISON:  Left wrist radiographs 08/16/2017 FINDINGS: There is diffuse decreased  bone mineralization. Mild radiocarpal joint space narrowing. Mild degenerative cystic change at the distal radial styloid. 2 mm ulnar positive variance. Mild-to-moderate triscaphe and thumb intercarpal joint space narrowing and peripheral osteophytosis. Mild-to-moderate thumb interphalangeal and second through fifth DIP and fourth PIP joint  space narrowing and peripheral osteophytosis. There is mild irregularity of the soft tissues at the lateral/radial aspect of the fourth finger at the base of the middle phalanx consistent with reported infection/wound. No definite cortical erosion is seen. IMPRESSION: 1. Mild irregularity of the soft tissues at the lateral/radial aspect of the fourth finger at the base of the middle phalanx consistent with reported infection/wound. No definite cortical erosion is seen to indicate radiographic evidence of acute osteomyelitis. 2. Mild-to-moderate osteoarthritis of the left hand and wrist. Electronically Signed   By: Neita Garnet M.D.   On: 07/04/2023 12:35   - Positive ROS: All other systems have been reviewed and were otherwise negative with the exception of those mentioned in the HPI and as above.  Physical Exam: General: No acute distress, resting comfortably Cardiovascular: BUE warm and well perfused, normal rate Respiratory: Normal WOB on RA Skin: Warm and dry Neurologic: Sensation intact distally Psychiatric: Patient is at baseline mood and affect  Left upper Extremity  Left hand with pain volarly along the ring finger flexor sheath, pain with passive extension of the ring finger and notable swelling diffusely throughout the ring finger  Mild streaking up the forearm as well    Assessment/Plan: Given her ongoing symptoms despite IV antibiotic treatment overnight, we will proceed with surgical washout of the left ring finger with irrigation and debridement for concern for ongoing flexor tenosynovitis  Risks and benefits of the procedure were discussed, risks including but not limited to infection, bleeding, scarring, stiffness, nerve injury, tendon injury, vascular injury, hardware complication if utilized, recurrence of symptoms and need for subsequent operation.  Patient expressed understanding.    Likely will be able to be discharged after surgery today Keep n.p.o.   Thank you for  the consult and the opportunity to see Allison Yoder  Jerriann Schrom Trevor Mace, M.D. [facility] 8:35 AM

## 2023-07-05 NOTE — Op Note (Signed)
NAME: Allison Yoder MEDICAL RECORD NO: [id number] DATE OF BIRTH: 1951/03/18 FACILITY: [facility] LOCATION: [facility] OR PHYSICIAN: Samuella Cota, MD   OPERATIVE REPORT   DATE OF PROCEDURE: 07/05/23    PREOPERATIVE DIAGNOSIS: Left ring finger flexor tenosynovitis   POSTOPERATIVE DIAGNOSIS: Left ring finger flexor tenosynovitis   PROCEDURE: Left ring finger irrigation and debridement flexor tendon sheath   SURGEON:  Samuella Cota, M.D.   ASSISTANT: None   ANESTHESIA:  General   INTRAVENOUS FLUIDS:  Per anesthesia flow sheet.   ESTIMATED BLOOD LOSS:  Minimal.   COMPLICATIONS:  None.   SPECIMENS: Cultures taken   TOURNIQUET TIME:    Total Tourniquet Time Documented: Upper Arm (Left) - 14 minutes Total: Upper Arm (Left) - 14 minutes    DISPOSITION:  Stable to PACU.   INDICATIONS: This is a 72 year old female who presented with ongoing pain and swelling of the left ring finger.  She cut it with a knife 2 days prior, was initially doing fine and then subsequently had swelling and throbbing.  She was seen at an outside emergency department with concern for potential developing flexor tenosynovitis.  She was subsequently transferred to [facility] for IV antibiotics and evaluation by hand surgery.    Upon my evaluation, she had ongoing pain along the flexor apparatus at the ring finger which continued to persist, had swelling throughout the finger as well as streaking up the arm.  Given her persistent symptoms despite IV antibiotic therapy, she is indicated for left ring finger irrigation debridement of the flexor tendon sheath for possible tenosynovitis.  Risks and benefits of surgery were discussed including the risks of infection, bleeding, scarring, stiffness, nerve injury, vascular injury, tendon injury, need for subsequent operation, , need for repeat irrigation and debridement.  She voiced understanding of these risks and elected to proceed.  OPERATIVE COURSE: Patient was  seen and identified in the preoperative area and marked appropriately.  Surgical consent had been signed. Preoperative IV antibiotic prophylaxis was given. She was transferred to the operating room and placed in supine position with the Left upper extremity on an arm board.  General anesthesia was induced by the anesthesiologist.  Left upper extremity was prepped and draped in normal sterile orthopedic fashion.  A surgical pause was performed between the surgeons, anesthesia, and operating room staff and all were in agreement as to the patient, procedure, and site of procedure.  Tourniquet was placed and padded appropriately to left upper arm.  The arm was elevated and the tourniquet was inflated to 250 mmHg.  First began with a oblique incision at the level of the A1 pulley in line with the left ring finger.  Incision was carried down utilizing a 15 blade, blunt dissection was performed down to the level of the A1 pulley.  Care was taken to protect radial and ulnar digital neurovascular structures.  A1 pulley was sharply incised utilizing a Beaver blade, tenotomy scissors were then utilized to complete A1 pulley release.  Oblique incision was extended in [place] style fashion to the level of the MCP crease volarly, dissection was performed down to expose the proximal portion of the A2 pulley which was also released.  Purulence was notable at this level, cultures were taken.  Transverse incision was then made at the volar aspect of the DIP of the ring finger.  Dissection was performed down to the level of the A5 pulley.  The A5 pulley was sharply incised utilizing a Beaver blade, tenotomy scissor was utilized to further  open the A5 pulley distally.  18-gauge angiocatheter was then placed in antegrade fashion under the A1 pulley, copious irrigation was performed through the flexor tendon sheath of the ring finger, expressed fluid was notable through the opening at the A5 pulley indicating full communication.   Once were satisfied with our irrigation of the flexor tendon sheath, there was noted to be no remaining purulence appreciated.  The tourniquet was deflated at 14 minutes.  Fingertips were pink with brisk capillary refill after deflation of tourniquet.  Copious irrigation performed of the wound sites followed by closure utilizing 4-0 nylon in standard fashion.  Sterile dressings were applied followed by application of a loosefitting soft hand wrap.  The operative drapes were broken down.  The patient was awoken from anesthesia safely and taken to PACU in stable condition.  I will see her back in the office in 1 week for postoperative followup.  She can be discharged today by the medical service, prescription for oral antibiotics was provided.  I will plan to see her back in 5 days for postoperative wound check.  At that time we can review culture results and tailor antibiotics as needed.   Samuella Cota, MD Electronically signed, 07/05/23

## 2023-07-06 ENCOUNTER — Telehealth: Payer: Self-pay | Admitting: Orthopedic Surgery

## 2023-07-06 ENCOUNTER — Encounter (HOSPITAL_COMMUNITY): Payer: Self-pay | Admitting: Orthopedic Surgery

## 2023-07-06 NOTE — Telephone Encounter (Signed)
Left voicemail for her Have her tentatively scheduled for 9am on Tuesday 10/29

## 2023-07-06 NOTE — Telephone Encounter (Signed)
Pt called need an 1 wk post op. Surgery was 10/23. Please call pt at [phone].

## 2023-07-07 ENCOUNTER — Encounter: Payer: Self-pay | Admitting: Pharmacist

## 2023-07-07 NOTE — Care Management Obs Status (Signed)
MEDICARE OBSERVATION STATUS NOTIFICATION   Patient Details  Name: Allison Yoder MRN: [id number] Date of Birth: 11-Apr-1951   Medicare Observation Status Notification Given:  Yes  07/07/23 1039 - Late Entry for note completed on 07/05/2023 at [address] [place], RN 07/07/2023, 10:38 AM

## 2023-07-07 NOTE — Telephone Encounter (Signed)
Spoke with patient. Monday would work better for her; but I informed her we are completely full that day and opened Tuesday AM to see post ops She is ok with Tuesday but if anyone cancels Monday she would like to be notified.

## 2023-07-09 LAB — CULTURE, BLOOD (ROUTINE X 2)
Culture: NO GROWTH
Culture: NO GROWTH
Special Requests: ADEQUATE

## 2023-07-10 ENCOUNTER — Ambulatory Visit: Payer: Medicare Other | Admitting: Physical Therapy

## 2023-07-10 ENCOUNTER — Encounter: Payer: Self-pay | Admitting: Physical Therapy

## 2023-07-10 LAB — AEROBIC/ANAEROBIC CULTURE W GRAM STAIN (SURGICAL/DEEP WOUND)
Culture: NO GROWTH
Gram Stain: NONE SEEN

## 2023-07-10 NOTE — Therapy (Signed)
[facility] [address] [place], [place], [address] Phone: [phone]   Fax:  [phone]  Patient Details  Name: Allison Yoder MRN: [id number] Date of Birth: May 06, 1951 Referring Provider:  No ref. provider found  Encounter Date: 07/10/2023  Patient contacted about her scheduled appointment this date and that she will need a resume PT order due to being hospitalized and having surgery since last visit. Left a VM for patient about either rescheduling her appointment or d/c from PT at this time. Pt returns phone call to clinic and is agreeable to d/c. Patient will be d/c from OPPT services at this time and will need a new referral to resume PT services.   Peter Congo, PT Peter Congo, PT, DPT, CSRS  07/10/2023, 9:34 AM  [facility] [facility] [address] [place], [place], [address] Phone: [phone]   Fax:  [phone]

## 2023-07-11 ENCOUNTER — Ambulatory Visit (INDEPENDENT_AMBULATORY_CARE_PROVIDER_SITE_OTHER): Payer: Medicare Other | Admitting: Orthopedic Surgery

## 2023-07-11 DIAGNOSIS — L089 Local infection of the skin and subcutaneous tissue, unspecified: Secondary | ICD-10-CM

## 2023-07-11 NOTE — Progress Notes (Signed)
   Allison Yoder - 72 y.o. female MRN [id number]  Date of birth: 06/20/1951  Office Visit Note: Visit Date: 07/11/2023 PCP: Joaquim Nam, MD Referred by: Joaquim Nam, MD  Subjective:  HPI: Allison Yoder is a 72 y.o. female who presents today for follow up 1 week status post irrigation and debridement of left fourth finger for flexor tenosynovitis.  She is doing well overall, pain is well-controlled.  Does have some ongoing stiffness in the ring finger.  Pertinent ROS were reviewed with the patient and found to be negative unless otherwise specified above in HPI.   Assessment & Plan: Visit Diagnoses: No diagnosis found.  Plan: She is demonstrating appropriate healing postoperatively.  We will leave the sutures in for an additional 1 week to allow for further healing.  In the meantime, she can begin range of motion of the hand given the ongoing stiffness.  Can do dry dressings daily.  Follow-up for suture removal next week.  Follow-up: No follow-ups on file.   Meds & Orders: No orders of the defined types were placed in this encounter.  No orders of the defined types were placed in this encounter.    Procedures: No procedures performed       Objective:   Vital Signs: There were no vitals taken for this visit.  Ortho Exam Left hand: - Well-healing volar incision ring finger A1 region, well-healing incision A5 region volarly.  Sutures are in place, skin edges well-approximated without erythema or drainage - Able to perform digital range of motion, ring finger is somewhat limited actively, improved passively without significant pain - No tenderness over the flexor sheath, no pain with passive stretch, no fusiform swelling or edema  Imaging: No results found.   Reo Portela Trevor Mace, M.D. [facility] 9:12 AM

## 2023-07-20 ENCOUNTER — Other Ambulatory Visit: Payer: Self-pay

## 2023-07-20 ENCOUNTER — Ambulatory Visit (INDEPENDENT_AMBULATORY_CARE_PROVIDER_SITE_OTHER): Payer: Medicare Other | Admitting: Rehabilitative and Restorative Service Providers"

## 2023-07-20 ENCOUNTER — Ambulatory Visit: Payer: Medicare Other | Admitting: Orthopedic Surgery

## 2023-07-20 ENCOUNTER — Encounter: Payer: Self-pay | Admitting: Rehabilitative and Restorative Service Providers"

## 2023-07-20 DIAGNOSIS — M6281 Muscle weakness (generalized): Secondary | ICD-10-CM | POA: Diagnosis not present

## 2023-07-20 DIAGNOSIS — M25642 Stiffness of left hand, not elsewhere classified: Secondary | ICD-10-CM

## 2023-07-20 DIAGNOSIS — L089 Local infection of the skin and subcutaneous tissue, unspecified: Secondary | ICD-10-CM | POA: Diagnosis not present

## 2023-07-20 DIAGNOSIS — R278 Other lack of coordination: Secondary | ICD-10-CM

## 2023-07-20 DIAGNOSIS — M79642 Pain in left hand: Secondary | ICD-10-CM

## 2023-07-20 DIAGNOSIS — R600 Localized edema: Secondary | ICD-10-CM | POA: Diagnosis not present

## 2023-07-20 NOTE — Progress Notes (Signed)
   Allison Yoder - 72 y.o. female MRN [id number]  Date of birth: May 18, 1951  Office Visit Note: Visit Date: 07/20/2023 PCP: Joaquim Nam, MD Referred by: Joaquim Nam, MD  Subjective:  HPI: Allison Yoder is a 72 y.o. female who presents today for follow up 2 weeks status post irrigation and debridement of left fourth finger.  Pertinent ROS were reviewed with the patient and found to be negative unless otherwise specified above in HPI.   Assessment & Plan: Visit Diagnoses: No diagnosis found.  Plan: She is doing well overall.  Sutures removed today.  Begin hand therapy for range of motion exercises with transition to home exercise program.  Can follow-up in approximately 4 to 6 weeks for recheck.  Follow-up: No follow-ups on file.   Meds & Orders: No orders of the defined types were placed in this encounter.  No orders of the defined types were placed in this encounter.    Procedures: No procedures performed       Objective:   Vital Signs: There were no vitals taken for this visit.  Ortho Exam Left hand: - Well-healed volar incision ring finger A1 region, well-healed incision A5 region volarly.  Sutures are in place, skin edges well-approximated without erythema or drainage - Able to perform digital range of motion, near composite fist, ring finger is somewhat limited actively, approximately 0.5 cm from distal palmar crease, improved passively without significant pain - No tenderness over the flexor sheath, no pain with passive stretch, no fusiform swelling or edema  Imaging: No results found.   Lenna Hagarty Trevor Mace, M.D. [facility] 9:05 AM

## 2023-07-20 NOTE — Therapy (Signed)
OUTPATIENT OCCUPATIONAL THERAPY ORTHO EVALUATION  Patient Name: Allison Yoder MRN: [id number] DOB:1950-09-23, 72 y.o., female Today's Date: 07/20/2023  PCP: Crawford Givens, MD REFERRING PROVIDER:  Samuella Cota, MD    END OF SESSION:  OT End of Session - 07/20/23 0925     Visit Number 1    Number of Visits 5    Date for OT Re-Evaluation 08/18/23    Authorization Type BC[employer] Medicare    OT Start Time [phone]    OT Stop Time 1018    OT Time Calculation (min) 51 min    Activity Tolerance Patient tolerated treatment well;No increased pain;Patient limited by fatigue;Patient limited by pain    Behavior During Therapy [facility] for tasks assessed/performed             Past Medical History:  Diagnosis Date   Allergy    Anemia    "all the time as a child"   Anxiety    Arthritis    "all my joints; worse in my hands" (10/27/2015)   Chronic lower back pain    Depression    Diverticulosis    Fatty liver    GERD (gastroesophageal reflux disease)    Grade I diastolic dysfunction    Heart murmur    "dx'd years ago; very mild; never treated" (10/27/2015)   Hyperlipidemia    Hypertension    Hypothyroidism 2002-~ 2010   Iritis    per [facility]   Migraines    "stopped when I went thru menopause"   NASH (nonalcoholic steatohepatitis)    OSA on CPAP    Pneumonia ~ 2013; 10/27/2015   Recurrent urinary tract infection    Routine general medical examination at a health care facility 02/26/2014   Snores    Stroke (HCC) 2017   TIAs x2.  No deficits   Type II diabetes mellitus (HCC)    "lost weight; started exercising again; don't have it anymore" (10/27/2015)   Urinary incontinence    Wears glasses    Wears hearing aid in both ears    Past Surgical History:  Procedure Laterality Date   BLEPHAROPLASTY Bilateral 2013; 2016   BREAST LUMPECTOMY Right 1964   benign tumor,   CATARACT EXTRACTION W/PHACO Right 01/31/2023   Procedure: CATARACT EXTRACTION PHACO AND INTRAOCULAR LENS  PLACEMENT (IOC) RIGHT DIABETIC  5.04  00:36.2;  Surgeon: Galen Manila, MD;  Location: [facility];  Service: Ophthalmology;  Laterality: Right;  Diabetic   CATARACT EXTRACTION W/PHACO Left 02/14/2023   Procedure: CATARACT EXTRACTION PHACO AND INTRAOCULAR LENS PLACEMENT (IOC) LEFT DIABETIC  5.73  00:40.1;  Surgeon: Galen Manila, MD;  Location: [facility];  Service: Ophthalmology;  Laterality: Left;  Diabetic   HEMORRHOID BANDING  X 2   I & D EXTREMITY Left 07/05/2023   Procedure: IRRIGATION AND DEBRIDEMENT OF LEFT FOURTH FINGER;  Surgeon: Samuella Cota, MD;  Location: [facility] OR;  Service: Orthopedics;  Laterality: Left;   KNEE ARTHROSCOPY Left 2016   "meniscus repair"   PELVIC FLOOR REPAIR  2001   "lift"   POLYPECTOMY  X 3   "bladder"   SHOULDER ARTHROSCOPY W/ ROTATOR CUFF REPAIR Left 2013   SHOULDER ARTHROSCOPY W/ ROTATOR CUFF REPAIR Right 2015   TUBAL LIGATION  ~ 1986   VAGINAL HYSTERECTOMY  1992   Partial   Patient Active Problem List   Diagnosis Date Noted   Infected finger 07/04/2023   SIRS (systemic inflammatory response syndrome) (HCC) 07/04/2023   Sciatica 05/28/2023   RUQ abdominal  pain 05/15/2023   Hand paresthesia 04/30/2023   Urine abnormality 11/19/2022   BPV (benign positional vertigo) 11/19/2022   Sore throat 10/12/2022   Acute non-recurrent frontal sinusitis 09/26/2022   Drug-induced myopathy 02/18/2022   Acute nonsuppurative otitis media of right ear 01/19/2022   Wheezing 01/19/2022   Abnormal liver function 11/16/2021   Diverticulitis of colon 11/16/2021   Hematochezia 11/16/2021   Deviated septum 11/09/2021   Urinary frequency 06/09/2021   SOBOE (shortness of breath on exertion) 06/09/2021   Facial droop 05/20/2021   Dizziness 05/20/2021   LFT elevation 03/28/2021   Right hip pain 01/31/2021   Other social stressor 12/13/2020   Dysphagia 07/22/2020   Abnormal cervical Papanicolaou smear 04/14/2020   Healthcare maintenance  07/22/2019   Cough 07/22/2019   Arthralgia 07/22/2019   Medicare annual wellness visit, initial 06/10/2018   Chest heaviness 06/10/2018   Osteopenia 09/10/2017   Fatty liver 05/19/2017   Diabetes mellitus without complication (HCC) 05/05/2017   History of cerebrovascular accident (CVA) due to ischemia 04/08/2016   Essential hypertension 10/27/2015   Advance care planning 04/29/2015   Obstructive sleep apnea of adult 07/25/2014   Palpitations 01/06/2013   Anxiety and depression 09/01/2012   Shoulder pain 12/19/2011   MDD (major depressive disorder) 12/19/2011   OA (osteoarthritis) 12/19/2011   Hyperlipidemia associated with type 2 diabetes mellitus (HCC) 12/19/2011   Diverticulitis 12/19/2011   Migraines 12/19/2011   Hypothyroid 12/19/2011   Iritis 12/19/2011    ONSET DATE: 07/05/23 DOS  REFERRING DIAG: W09.811 (ICD-10-CM) - Pain in left hand   THERAPY DIAG:  Localized edema  Muscle weakness (generalized)  Pain in left hand  Stiffness of left hand, not elsewhere classified  Other lack of coordination  Rationale for Evaluation and Treatment: Rehabilitation  SUBJECTIVE:   SUBJECTIVE STATEMENT: She states having some stiffness, weakness, pain in her left hand since her surgery.  She had her sutures out today, but she states already soaking her hand at home at times.  She also states using heat on her hand.  These things should not be happening yet and they show a lack of safety awareness and typical postsurgical precautions.  She states her injury was from cutting a piece of frozen bread that slipped in her hand, eventually becoming infected.    PERTINENT HISTORY: Per MD notes: " 2 weeks status post irrigation and debridement of left fourth finger. " Was treated for Left ring finger flexor tenosynovitis after cutting her finger with a knife, developing an infection and not responding to antibiotics. She had A1, A2 releases and thorough irrigation from DIP level incision to  MCP J area.    PRECAUTIONS: Other: latex and tape prolonged exposure may cause skin irritation  RED FLAGS: None   WEIGHT BEARING RESTRICTIONS: Yes <5#  PAIN:  Are you having pain? Yes: NPRS scale: 8/10 Pain location: Hand ring finger Pain description: Aching/sore Aggravating factors: Weight bearing and moving too much Relieving factors: Rest and ice  FALLS: Has patient fallen in last 6 months? No  LIVING ENVIRONMENT: Lives with: lives with their family and lives alone Lives in: House/apartment Has following equipment at home: None  PLOF: Independent  PATIENT GOALS: To improve the use of her left nondominant hand    OBJECTIVE: (All objective assessments below are from initial evaluation on: 07/20/23 unless otherwise specified.)   HAND DOMINANCE: Right   ADLs: Overall ADLs: States decreased ability to grab, hold household objects, pain and difficulty to open containers, perform FMS tasks (manipulate  fasteners on clothing), mild to moderate bathing problems as well.    FUNCTIONAL OUTCOME MEASURES: Eval: Patient Specific Functional Scale: 1.6 (hold steering wheel, use a broom, open a jar)  (Higher Score  =  Better Ability for the Selected Tasks)      UPPER EXTREMITY ROM     Shoulder to Wrist AROM Left eval  Wrist flexion 70  Wrist extension 70  (Blank rows = not tested)   Hand AROM Left eval  Full Fist Ability (or Gap to Distal Palmar Crease) Unable   Thumb Opposition  (Kapandji Scale)  8/10  Ring MCP (0-90) 0- 71  Ring PIP (0-100)  0- 83  Ring DIP (0-70)  0- 41  (Blank rows = not tested)   UPPER EXTREMITY MMT:    Eval: NT at eval due to recent and still healing injuries. Will be tested when appropriate.   MMT Left TBD  Elbow flexion   Elbow extension   Forearm supination   Forearm pronation   Wrist flexion   Wrist extension   (Blank rows = not tested)  HAND FUNCTION: Eval: Observed weakness in affected Lt hand.  Grip strength Right: TBD lbs,  Left: TBD lbs   COORDINATION: Eval: No observed coordination impairments with affected Lt hand. 9 Hole Peg Test Left: 21 sec (21 sec is WFL)   SENSATION: Eval:  Light touch present today, though diminished around sx area and into the sides and tip of her Lt RF  EDEMA:   Eval:  Mildly swollen in Lt hand RF today  COGNITION: Eval: Overall cognitive status: WFL for evaluation today   OBSERVATIONS:   Eval: Finger is slightly flushed and swollen not very tender to palpation.  Surgical areas look well-healing with some scabbing still and some dry skin.  Finger generally presents as stiff but does not seem to affect her wrist or adjacent fingers very much.  She does state in show habit of guarding from pain and leaving her digits 4 and 5 extended out when using her hand.  She has no sign of extensor lag at all right now.  This will be monitored and perhaps she may need a nighttime extension brace to help with this or prevent this.   TODAY'S TREATMENT:  Post-evaluation treatment:   First off, she was given safety/self-care education to do no weightbearing that is painful or greater than 3 to 5 pounds for the next 2 weeks.  She was educated to do no soaking of her wound for at least 3 days to allow the sutures to completely close.  She should not have been soaking her hand earlier in this process, either.  She was told to avoid significant heat until 3 weeks postop to prevent swelling increases.  She can perform contrast baths in 3 days once suture wounds are completely healed.  She was also educated to use Vaseline as a barrier and a moisturizer and do light desensitization for her skin and nerves.  She was also given the following home exercise program to begin with active range of motion to tension points as well as some wrist stretches (bolded below).  Additionally she was educated on some stretches that she can start after 3 to 7 days as tolerated, but they should not be painful.  None of her  exercises should be sharply painful or problematic or cause lingering issues.  She states understanding these things and will follow-up in a week to determine how she is doing.   Exercises - Wrist  Flexion Stretch  - 4 x daily - 3-5 reps - 15 sec hold - Wrist Prayer Stretch  - 4 x daily - 3-5 reps - 15 sec hold - Finger Spreading  - 4-6 x daily - 10-15 reps - Tendon Glides  - 4-6 x daily - 3-5 reps - 2-3 seconds hold - Tip Joint Blocking Motion  - 4-6 x daily - 10-15 reps - Hand AROM PIP Blocking  - 4-6 x daily - 10-15 reps - Seated Single Finger Extension  - 4-6 x daily - 10-15 reps  Wait 3 days:  - BACK KNUCKLE STRETCHES   - 4 x daily - 3-5 reps - 15 sec hold - HOOK Stretch  - 4 x daily - 3-5 reps - 15-20 sec hold - Seated Finger Composite Flexion Stretch  - 4 x daily - 3-5 reps - 15 hold - PUSH KNUCKLES DOWN  - 4 x daily - 3-5 reps - 15 seconds hold    PATIENT EDUCATION: Education details: See tx section above for details  Person educated: Patient Education method: Engineer, structural, Teach back, Handouts  Education comprehension: States and demonstrates understanding, Additional Education required    HOME EXERCISE PROGRAM: Access Code: [email or web address] URL: [email or web address] Date: 07/20/2023 Prepared by: Fannie Knee   GOALS: Goals reviewed with patient? Yes   SHORT TERM GOALS: (STG required if POC>30 days) Target Date: 07/27/23  Pt will obtain protective, custom orthotic. Goal status: TBD/PRN  2.  Pt will demo/state understanding of initial HEP to improve pain levels and prerequisite motion. Goal status: INITIAL   LONG TERM GOALS: Target Date: 08/18/23  Pt will improve functional ability by decreased impairment per PSFS assessment from 1.6 to 7 or better, for better quality of life. Goal status: INITIAL  2.  Pt will improve grip strength in left hand from unsafe to test to at least 35 lbs for functional use at home and in IADLs. Goal status:  INITIAL  3.  Pt will improve A/ROM in Lt RF from 195* to at least 220*, to have functional motion for tasks like reach and grasp.  Goal status: INITIAL  4.  Pt will decrease pain at rest from 8/10 to 2/10 or better to have better sleep and occupational participation in daily roles. Goal status: INITIAL   ASSESSMENT:  CLINICAL IMPRESSION: Patient is a 72 y.o. female who was seen today for occupational therapy evaluation for left hand stiffness, soreness, weakness and decreased functional ability due to recent infection and surgery with recovery.  She will benefit from outpatient occupational therapy to safely improve her functional ability.   PERFORMANCE DEFICITS: in functional skills including ADLs, IADLs, sensation, edema, ROM, strength, pain, fascial restrictions, body mechanics, endurance, decreased knowledge of precautions, wound, and UE functional use, cognitive skills including problem solving and safety awareness, and psychosocial skills including coping strategies, environmental adaptation, and habits.   IMPAIRMENTS: are limiting patient from ADLs, IADLs, work, and leisure.   COMORBIDITIES: may have co-morbidities  that affects occupational performance. Patient will benefit from skilled OT to address above impairments and improve overall function.  MODIFICATION OR ASSISTANCE TO COMPLETE EVALUATION: No modification of tasks or assist necessary to complete an evaluation.  OT OCCUPATIONAL PROFILE AND HISTORY: Problem focused assessment: Including review of records relating to presenting problem.  CLINICAL DECISION MAKING: LOW - limited treatment options, no task modification necessary  REHAB POTENTIAL: Excellent  EVALUATION COMPLEXITY: Low      PLAN:  OT FREQUENCY: 1x/week  OT DURATION: 4 weeks through 08/18/2023  and up to 5 total visits as needed  PLANNED INTERVENTIONS: 97168 OT Re-evaluation, 97535 self care/ADL training, 56213 therapeutic exercise, 97530 therapeutic  activity, 97112 neuromuscular re-education, 97140 manual therapy, 97035 ultrasound, 97039 fluidotherapy, 97010 moist heat, 97010 cryotherapy, 97034 contrast bath, 97760 Orthotics management and training, 08657 Splinting (initial encounter), [phone] Subsequent splinting/medication, manual lymph drainage, scar mobilization, compression bandaging, coping strategies training, and patient/family education  RECOMMENDED OTHER SERVICES: None now  CONSULTED AND AGREED WITH PLAN OF CARE: Patient  PLAN FOR NEXT SESSION:   Review initial home exercise program and if there is any sign of extensor lag create a nighttime finger extension orthosis   Mylez Venable, OTR/L, CHT 07/20/2023, 11:13 AM

## 2023-07-26 ENCOUNTER — Encounter: Payer: Medicare Other | Admitting: Rehabilitative and Restorative Service Providers"

## 2023-07-27 NOTE — Telephone Encounter (Signed)
PAP: Patient has been denied for pt assistance by PAP Companies: [employer] due to PATIENT HAS INSURANCE, SEE DENIAL ON CHART MEDIA, LETTER HAS BEEN MAILED TO PATIENT

## 2023-07-31 NOTE — Therapy (Signed)
OUTPATIENT OCCUPATIONAL THERAPY TREATMENT NOTE  Patient Name: Allison Yoder MRN: [id number] DOB:1950-09-29, 72 y.o., female Today's Date: 08/02/2023  PCP: Crawford Givens, MD REFERRING PROVIDER:  Samuella Cota, MD    END OF SESSION:  OT End of Session - 08/02/23 0849     Visit Number 2    Number of Visits 5    Date for OT Re-Evaluation 08/18/23    Authorization Type BC[employer] Medicare    OT Start Time 0849    OT Stop Time 0928    OT Time Calculation (min) 39 min    Activity Tolerance Patient tolerated treatment well;No increased pain;Patient limited by fatigue;Patient limited by pain    Behavior During Therapy [facility] for tasks assessed/performed              Past Medical History:  Diagnosis Date   Allergy    Anemia    "all the time as a child"   Anxiety    Arthritis    "all my joints; worse in my hands" (10/27/2015)   Chronic lower back pain    Depression    Diverticulosis    Fatty liver    GERD (gastroesophageal reflux disease)    Grade I diastolic dysfunction    Heart murmur    "dx'd years ago; very mild; never treated" (10/27/2015)   Hyperlipidemia    Hypertension    Hypothyroidism 2002-~ 2010   Iritis    per [facility]   Migraines    "stopped when I went thru menopause"   NASH (nonalcoholic steatohepatitis)    OSA on CPAP    Pneumonia ~ 2013; 10/27/2015   Recurrent urinary tract infection    Routine general medical examination at a health care facility 02/26/2014   Snores    Stroke (HCC) 2017   TIAs x2.  No deficits   Type II diabetes mellitus (HCC)    "lost weight; started exercising again; don't have it anymore" (10/27/2015)   Urinary incontinence    Wears glasses    Wears hearing aid in both ears    Past Surgical History:  Procedure Laterality Date   BLEPHAROPLASTY Bilateral 2013; 2016   BREAST LUMPECTOMY Right 1964   benign tumor,   CATARACT EXTRACTION W/PHACO Right 01/31/2023   Procedure: CATARACT EXTRACTION PHACO AND INTRAOCULAR  LENS PLACEMENT (IOC) RIGHT DIABETIC  5.04  00:36.2;  Surgeon: Galen Manila, MD;  Location: [facility];  Service: Ophthalmology;  Laterality: Right;  Diabetic   CATARACT EXTRACTION W/PHACO Left 02/14/2023   Procedure: CATARACT EXTRACTION PHACO AND INTRAOCULAR LENS PLACEMENT (IOC) LEFT DIABETIC  5.73  00:40.1;  Surgeon: Galen Manila, MD;  Location: [facility];  Service: Ophthalmology;  Laterality: Left;  Diabetic   HEMORRHOID BANDING  X 2   I & D EXTREMITY Left 07/05/2023   Procedure: IRRIGATION AND DEBRIDEMENT OF LEFT FOURTH FINGER;  Surgeon: Samuella Cota, MD;  Location: [facility] OR;  Service: Orthopedics;  Laterality: Left;   KNEE ARTHROSCOPY Left 2016   "meniscus repair"   PELVIC FLOOR REPAIR  2001   "lift"   POLYPECTOMY  X 3   "bladder"   SHOULDER ARTHROSCOPY W/ ROTATOR CUFF REPAIR Left 2013   SHOULDER ARTHROSCOPY W/ ROTATOR CUFF REPAIR Right 2015   TUBAL LIGATION  ~ 1986   VAGINAL HYSTERECTOMY  1992   Partial   Patient Active Problem List   Diagnosis Date Noted   Infected finger 07/04/2023   SIRS (systemic inflammatory response syndrome) (HCC) 07/04/2023   Sciatica 05/28/2023   RUQ  abdominal pain 05/15/2023   Hand paresthesia 04/30/2023   Urine abnormality 11/19/2022   BPV (benign positional vertigo) 11/19/2022   Sore throat 10/12/2022   Acute non-recurrent frontal sinusitis 09/26/2022   Drug-induced myopathy 02/18/2022   Acute nonsuppurative otitis media of right ear 01/19/2022   Wheezing 01/19/2022   Abnormal liver function 11/16/2021   Diverticulitis of colon 11/16/2021   Hematochezia 11/16/2021   Deviated septum 11/09/2021   Urinary frequency 06/09/2021   SOBOE (shortness of breath on exertion) 06/09/2021   Facial droop 05/20/2021   Dizziness 05/20/2021   LFT elevation 03/28/2021   Right hip pain 01/31/2021   Other social stressor 12/13/2020   Dysphagia 07/22/2020   Abnormal cervical Papanicolaou smear 04/14/2020   Healthcare maintenance  07/22/2019   Cough 07/22/2019   Arthralgia 07/22/2019   Medicare annual wellness visit, initial 06/10/2018   Chest heaviness 06/10/2018   Osteopenia 09/10/2017   Fatty liver 05/19/2017   Diabetes mellitus without complication (HCC) 05/05/2017   History of cerebrovascular accident (CVA) due to ischemia 04/08/2016   Essential hypertension 10/27/2015   Advance care planning 04/29/2015   Obstructive sleep apnea of adult 07/25/2014   Palpitations 01/06/2013   Anxiety and depression 09/01/2012   Shoulder pain 12/19/2011   MDD (major depressive disorder) 12/19/2011   OA (osteoarthritis) 12/19/2011   Hyperlipidemia associated with type 2 diabetes mellitus (HCC) 12/19/2011   Diverticulitis 12/19/2011   Migraines 12/19/2011   Hypothyroid 12/19/2011   Iritis 12/19/2011    ONSET DATE: 07/05/23 DOS  REFERRING DIAG: Z30.865 (ICD-10-CM) - Pain in left hand   THERAPY DIAG:  Localized edema  Muscle weakness (generalized)  Pain in left hand  Stiffness of left hand, not elsewhere classified  Other lack of coordination  Rationale for Evaluation and Treatment: Rehabilitation  PERTINENT HISTORY: Per MD notes: " 2 weeks status post irrigation and debridement of left fourth finger. " Was treated for Left ring finger flexor tenosynovitis after cutting her finger with a knife, developing an infection and not responding to antibiotics. She had A1, A2 releases and thorough irrigation from DIP level incision to MCP J area.    PRECAUTIONS: Other: latex and tape prolonged exposure may cause skin irritation  RED FLAGS: None ;  WEIGHT BEARING RESTRICTIONS: Yes <5#    SUBJECTIVE:   SUBJECTIVE STATEMENT: Now 4 weeks post-op, I& D pyogenic tenosynovitis.  She states she had to cancel last week's appointment due to having some diverticulosis episode.  She admits to not being able to do her exercises completely and she has some pain and swelling today.    PAIN:  Are you having pain? Yes: NPRS  scale: 5-6/10 "feels like hell" Pain location: Hand ring finger Pain description: Aching/sore Aggravating factors: Weight bearing and moving too much Relieving factors: Rest and ice   PATIENT GOALS: To improve the use of her left nondominant hand    OBJECTIVE: (All objective assessments below are from initial evaluation on: 07/20/23 unless otherwise specified.)   HAND DOMINANCE: Right   ADLs: Overall ADLs: States decreased ability to grab, hold household objects, pain and difficulty to open containers, perform FMS tasks (manipulate fasteners on clothing), mild to moderate bathing problems as well.    FUNCTIONAL OUTCOME MEASURES: Eval: Patient Specific Functional Scale: 1.6 (hold steering wheel, use a broom, open a jar)  (Higher Score  =  Better Ability for the Selected Tasks)      UPPER EXTREMITY ROM     Shoulder to Wrist AROM Left eval  Wrist flexion 70  Wrist extension 70  (Blank rows = not tested)   Hand AROM Left eval Lt 08/02/23  Full Fist Ability (or Gap to Distal Palmar Crease) Unable    Thumb Opposition  (Kapandji Scale)  8/10   Ring MCP (0-90) 0- 71 0 - 69  Ring PIP (0-100)  0- 83 0 - 83  Ring DIP (0-70)  0- 41 0 - 44  (Blank rows = not tested)   UPPER EXTREMITY MMT:    Eval: NT at eval due to recent and still healing injuries. Will be tested when appropriate.   MMT Left TBD  Elbow flexion   Elbow extension   Forearm supination   Forearm pronation   Wrist flexion   Wrist extension   (Blank rows = not tested)  HAND FUNCTION: 08/10/23: Grip strength Right: TBD lbs, Left: TBD lbs   Eval: Observed weakness in affected Lt hand.    COORDINATION: Eval: No observed coordination impairments with affected Lt hand. 9 Hole Peg Test Left: 21 sec (21 sec is WFL)   SENSATION: Eval:  Light touch present today, though diminished around sx area and into the sides and tip of her Lt RF   OBSERVATIONS:   08/02/23: 7.0cm about Lt RF P1 (compared to 6.4cm in  opposite hand)   Eval: Finger is slightly flushed and swollen not very tender to palpation.  Surgical areas look well-healing with some scabbing still and some dry skin.  Finger generally presents as stiff but does not seem to affect her wrist or adjacent fingers very much.  She does state in show habit of guarding from pain and leaving her digits 4 and 5 extended out when using her hand.  She has no sign of extensor lag at all right now.  This will be monitored and perhaps she may need a nighttime extension brace to help with this or prevent this. (I& D pyogenic tenosynovitis)   TODAY'S TREATMENT:  08/02/23: To help with pain and stiffness as well as swelling, OT starts with manual therapy vibration technique with hand held above the heart.  This seems to help calm her nerve pain somewhat and she still is hypersensitive in certain surgical areas.  OT then does IASTM in a retrograde fashion to help with fascial tightness as well as edema.  She performs active range of motion which does not show significant improvement since last seen, but she admits to not doing exercises much due to illness.  OT then does her exercises with her reeducating on the important stretches at the MCP joint IP joints and in whole finger flexion.  Again she is recommended to not do anything painful or stressful for the next week until seen and hopefully swelling and tenderness is down some.  She leaves stating feeling better.    Exercises - Wrist Flexion Stretch  - 4 x daily - 3-5 reps - 15 sec hold - Wrist Prayer Stretch  - 4 x daily - 3-5 reps - 15 sec hold - Finger Spreading  - 4-6 x daily - 10-15 reps - Tendon Glides  - 4-6 x daily - 3-5 reps - 2-3 seconds hold - Seated Single Finger Extension  - 4-6 x daily - 10-15 reps - BACK KNUCKLE STRETCHES   - 4 x daily - 3-5 reps - 15 sec hold - HOOK Stretch  - 4 x daily - 3-5 reps - 15-20 sec hold - Seated Finger Composite Flexion Stretch  - 4 x daily - 3-5 reps - 15 hold -  PUSH KNUCKLES DOWN  - 4 x daily - 3-5 reps - 15 seconds hold    PATIENT EDUCATION: Education details: See tx section above for details  Person educated: Patient Education method: Verbal Instruction, Teach back, Handouts  Education comprehension: States and demonstrates understanding, Additional Education required    HOME EXERCISE PROGRAM: Access Code: [email or web address] URL: [email or web address] Date: 07/20/2023 Prepared by: Fannie Knee   GOALS: Goals reviewed with patient? Yes   SHORT TERM GOALS: (STG required if POC>30 days) Target Date: 07/27/23  Pt will obtain protective, custom orthotic. Goal status: TBD/PRN  2.  Pt will demo/state understanding of initial HEP to improve pain levels and prerequisite motion. Goal status: INITIAL   LONG TERM GOALS: Target Date: 08/18/23  Pt will improve functional ability by decreased impairment per PSFS assessment from 1.6 to 7 or better, for better quality of life. Goal status: INITIAL  2.  Pt will improve grip strength in left hand from unsafe to test to at least 35 lbs for functional use at home and in IADLs. Goal status: INITIAL  3.  Pt will improve A/ROM in Lt RF from 195* to at least 220*, to have functional motion for tasks like reach and grasp.  Goal status: INITIAL  4.  Pt will decrease pain at rest from 8/10 to 2/10 or better to have better sleep and occupational participation in daily roles. Goal status: INITIAL   ASSESSMENT:  CLINICAL IMPRESSION: 08/02/23: Her surgical area looks excellent but her range of motion is not significantly improved.  She is a bit tender and swollen so we will withhold any strengthening until at least next week.  Please focus on reducing edema and pain.  Eval: Patient is a 73 y.o. female who was seen today for occupational therapy evaluation for left hand stiffness, soreness, weakness and decreased functional ability due to recent infection and surgery with recovery.  She will  benefit from outpatient occupational therapy to safely improve her functional ability.     PLAN:  OT FREQUENCY: 1x/week  OT DURATION: 4 weeks through 08/18/2023 and up to 5 total visits as needed  PLANNED INTERVENTIONS: 97168 OT Re-evaluation, 97535 self care/ADL training, 91478 therapeutic exercise, 97530 therapeutic activity, 97112 neuromuscular re-education, 97140 manual therapy, 97035 ultrasound, 97039 fluidotherapy, 97010 moist heat, 97010 cryotherapy, 97034 contrast bath, 97760 Orthotics management and training, 29562 Splinting (initial encounter), [id number] Subsequent splinting/medication, manual lymph drainage, scar mobilization, compression bandaging, coping strategies training, and patient/family education  RECOMMENDED OTHER SERVICES: None now  CONSULTED AND AGREED WITH PLAN OF CARE: Patient  PLAN FOR NEXT SESSION:   Start with a contrast bath for MH and swelling reduction.  Continue with tolerable stretches, pain and edema reduction, and get into light hand strengthening with putty when tolerated   Fannie Knee, OTR/L, CHT 08/02/2023, 9:32 AM

## 2023-08-02 ENCOUNTER — Ambulatory Visit (INDEPENDENT_AMBULATORY_CARE_PROVIDER_SITE_OTHER): Payer: Medicare Other | Admitting: Rehabilitative and Restorative Service Providers"

## 2023-08-02 ENCOUNTER — Encounter: Payer: Self-pay | Admitting: Rehabilitative and Restorative Service Providers"

## 2023-08-02 DIAGNOSIS — R600 Localized edema: Secondary | ICD-10-CM

## 2023-08-02 DIAGNOSIS — M6281 Muscle weakness (generalized): Secondary | ICD-10-CM

## 2023-08-02 DIAGNOSIS — M25642 Stiffness of left hand, not elsewhere classified: Secondary | ICD-10-CM

## 2023-08-02 DIAGNOSIS — R278 Other lack of coordination: Secondary | ICD-10-CM

## 2023-08-02 DIAGNOSIS — M79642 Pain in left hand: Secondary | ICD-10-CM | POA: Diagnosis not present

## 2023-08-03 DIAGNOSIS — K5732 Diverticulitis of large intestine without perforation or abscess without bleeding: Secondary | ICD-10-CM | POA: Diagnosis not present

## 2023-08-03 DIAGNOSIS — R935 Abnormal findings on diagnostic imaging of other abdominal regions, including retroperitoneum: Secondary | ICD-10-CM | POA: Diagnosis not present

## 2023-08-07 NOTE — Therapy (Signed)
OUTPATIENT OCCUPATIONAL THERAPY TREATMENT NOTE  Patient Name: Allison Yoder MRN: [id number] DOB:06-30-1951, 72 y.o., female Today's Date: 08/09/2023  PCP: Crawford Givens, MD REFERRING PROVIDER:  Samuella Cota, MD    END OF SESSION:  OT End of Session - 08/09/23 0900     Visit Number 3    Number of Visits 5    Date for OT Re-Evaluation 08/18/23    Authorization Type [employer] Medicare    OT Start Time [phone]    OT Stop Time 0930    OT Time Calculation (min) 32 min    Equipment Utilized During Treatment putty    Activity Tolerance Patient tolerated treatment well;No increased pain;Patient limited by fatigue    Behavior During Therapy [facility] for tasks assessed/performed               Past Medical History:  Diagnosis Date   Allergy    Anemia    "all the time as a child"   Anxiety    Arthritis    "all my joints; worse in my hands" (10/27/2015)   Chronic lower back pain    Depression    Diverticulosis    Fatty liver    GERD (gastroesophageal reflux disease)    Grade I diastolic dysfunction    Heart murmur    "dx'd years ago; very mild; never treated" (10/27/2015)   Hyperlipidemia    Hypertension    Hypothyroidism 2002-~ 2010   Iritis    per [facility]   Migraines    "stopped when I went thru menopause"   NASH (nonalcoholic steatohepatitis)    OSA on CPAP    Pneumonia ~ 2013; 10/27/2015   Recurrent urinary tract infection    Routine general medical examination at a health care facility 02/26/2014   Snores    Stroke (HCC) 2017   TIAs x2.  No deficits   Type II diabetes mellitus (HCC)    "lost weight; started exercising again; don't have it anymore" (10/27/2015)   Urinary incontinence    Wears glasses    Wears hearing aid in both ears    Past Surgical History:  Procedure Laterality Date   BLEPHAROPLASTY Bilateral 2013; 2016   BREAST LUMPECTOMY Right 1964   benign tumor,   CATARACT EXTRACTION W/PHACO Right 01/31/2023   Procedure: CATARACT EXTRACTION  PHACO AND INTRAOCULAR LENS PLACEMENT (IOC) RIGHT DIABETIC  5.04  00:36.2;  Surgeon: Galen Manila, MD;  Location: [facility];  Service: Ophthalmology;  Laterality: Right;  Diabetic   CATARACT EXTRACTION W/PHACO Left 02/14/2023   Procedure: CATARACT EXTRACTION PHACO AND INTRAOCULAR LENS PLACEMENT (IOC) LEFT DIABETIC  5.73  00:40.1;  Surgeon: Galen Manila, MD;  Location: [facility];  Service: Ophthalmology;  Laterality: Left;  Diabetic   HEMORRHOID BANDING  X 2   I & D EXTREMITY Left 07/05/2023   Procedure: IRRIGATION AND DEBRIDEMENT OF LEFT FOURTH FINGER;  Surgeon: Samuella Cota, MD;  Location: [facility] OR;  Service: Orthopedics;  Laterality: Left;   KNEE ARTHROSCOPY Left 2016   "meniscus repair"   PELVIC FLOOR REPAIR  2001   "lift"   POLYPECTOMY  X 3   "bladder"   SHOULDER ARTHROSCOPY W/ ROTATOR CUFF REPAIR Left 2013   SHOULDER ARTHROSCOPY W/ ROTATOR CUFF REPAIR Right 2015   TUBAL LIGATION  ~ 1986   VAGINAL HYSTERECTOMY  1992   Partial   Patient Active Problem List   Diagnosis Date Noted   Infected finger 07/04/2023   SIRS (systemic inflammatory response syndrome) (HCC) 07/04/2023  Sciatica 05/28/2023   RUQ abdominal pain 05/15/2023   Hand paresthesia 04/30/2023   Urine abnormality 11/19/2022   BPV (benign positional vertigo) 11/19/2022   Sore throat 10/12/2022   Acute non-recurrent frontal sinusitis 09/26/2022   Drug-induced myopathy 02/18/2022   Acute nonsuppurative otitis media of right ear 01/19/2022   Wheezing 01/19/2022   Abnormal liver function 11/16/2021   Diverticulitis of colon 11/16/2021   Hematochezia 11/16/2021   Deviated septum 11/09/2021   Urinary frequency 06/09/2021   SOBOE (shortness of breath on exertion) 06/09/2021   Facial droop 05/20/2021   Dizziness 05/20/2021   LFT elevation 03/28/2021   Right hip pain 01/31/2021   Other social stressor 12/13/2020   Dysphagia 07/22/2020   Abnormal cervical Papanicolaou smear 04/14/2020    Healthcare maintenance 07/22/2019   Cough 07/22/2019   Arthralgia 07/22/2019   Medicare annual wellness visit, initial 06/10/2018   Chest heaviness 06/10/2018   Osteopenia 09/10/2017   Fatty liver 05/19/2017   Diabetes mellitus without complication (HCC) 05/05/2017   History of cerebrovascular accident (CVA) due to ischemia 04/08/2016   Essential hypertension 10/27/2015   Advance care planning 04/29/2015   Obstructive sleep apnea of adult 07/25/2014   Palpitations 01/06/2013   Anxiety and depression 09/01/2012   Shoulder pain 12/19/2011   MDD (major depressive disorder) 12/19/2011   OA (osteoarthritis) 12/19/2011   Hyperlipidemia associated with type 2 diabetes mellitus (HCC) 12/19/2011   Diverticulitis 12/19/2011   Migraines 12/19/2011   Hypothyroid 12/19/2011   Iritis 12/19/2011    ONSET DATE: 07/05/23 DOS  REFERRING DIAG: V40.981 (ICD-10-CM) - Pain in left hand   THERAPY DIAG:  Localized edema  Muscle weakness (generalized)  Stiffness of left hand, not elsewhere classified  Other lack of coordination  Pain in left hand  Rationale for Evaluation and Treatment: Rehabilitation  PERTINENT HISTORY: Per MD notes: " 2 weeks status post irrigation and debridement of left fourth finger. " Was treated for Left ring finger flexor tenosynovitis after cutting her finger with a knife, developing an infection and not responding to antibiotics. She had A1, A2 releases and thorough irrigation from DIP level incision to MCP J area.    PRECAUTIONS: Other: latex and tape prolonged exposure may cause skin irritation  RED FLAGS: None ;  WEIGHT BEARING RESTRICTIONS: Yes <5#    SUBJECTIVE:   SUBJECTIVE STATEMENT: Now 5 weeks post-op, I& D pyogenic tenosynovitis.  She states contrast bath really helped and her pain is lower after exercises.  She brought her grandson today.   PAIN:  Are you having pain? Yes: NPRS scale: 0/10 - only hurts with exercises Pain location: Hand ring  finger Pain description: Aching/sore Aggravating factors: Weight bearing and moving too much Relieving factors: Rest and ice   PATIENT GOALS: To improve the use of her left nondominant hand    OBJECTIVE: (All objective assessments below are from initial evaluation on: 07/20/23 unless otherwise specified.)   HAND DOMINANCE: Right   ADLs: Overall ADLs: States decreased ability to grab, hold household objects, pain and difficulty to open containers, perform FMS tasks (manipulate fasteners on clothing), mild to moderate bathing problems as well.    FUNCTIONAL OUTCOME MEASURES: Eval: Patient Specific Functional Scale: 1.6 (hold steering wheel, use a broom, open a jar)  (Higher Score  =  Better Ability for the Selected Tasks)      UPPER EXTREMITY ROM     Shoulder to Wrist AROM Left eval  Wrist flexion 70  Wrist extension 70  (Blank rows = not tested)  Hand AROM Left eval Lt 08/02/23 Lt 08/09/23  Full Fist Ability (or Gap to Distal Palmar Crease) Unable     Thumb Opposition  (Kapandji Scale)  8/10    Ring MCP (0-90) 0- 71 0 - 69 0 - 76  Ring PIP (0-100)  0- 83 0 - 83 0 - 93  Ring DIP (0-70)  0- 41 0 - 44 0 - 57  (Blank rows = not tested)   UPPER EXTREMITY MMT:    Eval: NT at eval due to recent and still healing injuries. Will be tested when appropriate.   MMT Left TBD  Elbow flexion   Elbow extension   Forearm supination   Forearm pronation   Wrist flexion   Wrist extension   (Blank rows = not tested)  HAND FUNCTION: 08/09/23: Grip strength Right: 65 lbs, Left: 41 lbs   Eval: Observed weakness in affected Lt hand.    COORDINATION: Eval: No observed coordination impairments with affected Lt hand. 9 Hole Peg Test Left: 21 sec (21 sec is WFL)   SENSATION: Eval:  Light touch present today, though diminished around sx area and into the sides and tip of her Lt RF   OBSERVATIONS:   08/09/23: 6.9cm about Lt RF P1 (compared to 6.5cm in opposite hand)    08/02/23: 7.0cm about Lt RF P1 (compared to 6.4cm in opposite hand)   Eval: Finger is slightly flushed and swollen not very tender to palpation.  Surgical areas look well-healing with some scabbing still and some dry skin.  Finger generally presents as stiff but does not seem to affect her wrist or adjacent fingers very much.  She does state in show habit of guarding from pain and leaving her digits 4 and 5 extended out when using her hand.  She has no sign of extensor lag at all right now.  This will be monitored and perhaps she may need a nighttime extension brace to help with this or prevent this. (I& D pyogenic tenosynovitis)   TODAY'S TREATMENT:  08/09/23: She starts with fluidotherapy machine for desensitization as well as active motion tendon gliding activity done in the fluidotherapy tank today for approx 5 mins.  She does state that her hypersensitivity is feeling better.  She then performs active range of motion today for exercise as well as new measures which show significant improvements and almost full total active motion through the ring finger now.  We review her home exercise program and she tolerates gripping today with no significant pain but does show some weakness, so OT upgrades her home exercises to include yellow therapy putty grip and pinch training as well as finger extension training as bolded below.  These activities were explained to her, demonstrated and she performs back with no significant pain or tenderness.    Exercises - Wrist Flexion Stretch  - 4 x daily - 3-5 reps - 15 sec hold - Wrist Prayer Stretch  - 4 x daily - 3-5 reps - 15 sec hold - Finger Spreading  - 4-6 x daily - 10-15 reps - Tendon Glides  - 4-6 x daily - 3-5 reps - 2-3 seconds hold - Seated Single Finger Extension  - 4-6 x daily - 10-15 reps - BACK KNUCKLE STRETCHES   - 4 x daily - 3-5 reps - 15 sec hold - HOOK Stretch  - 4 x daily - 3-5 reps - 15-20 sec hold - Seated Finger Composite Flexion  Stretch  - 4 x daily - 3-5 reps -  15 hold - PUSH KNUCKLES DOWN  - 4 x daily - 3-5 reps - 15 seconds hold - Full Fist  - 2-3 x daily - 5 reps - Seated Claw Fist with Putty  - 2-3 x daily - 5 reps - Finger Extension "Pizza!"   - 2-3 x daily - 5 reps - Cutting Putty  - 2-3 x daily - 5 reps    PATIENT EDUCATION: Education details: See tx section above for details  Person educated: Patient Education method: Verbal Instruction, Teach back, Handouts  Education comprehension: States and demonstrates understanding, Additional Education required    HOME EXERCISE PROGRAM: Access Code: [email or web address] URL: [email or web address] Date: 07/20/2023 Prepared by: Fannie Knee   GOALS: Goals reviewed with patient? Yes   SHORT TERM GOALS: (STG required if POC>30 days) Target Date: 07/27/23  Pt will obtain protective, custom orthotic. Goal status: TBD/PRN  2.  Pt will demo/state understanding of initial HEP to improve pain levels and prerequisite motion. Goal status: INITIAL   LONG TERM GOALS: Target Date: 08/18/23  Pt will improve functional ability by decreased impairment per PSFS assessment from 1.6 to 7 or better, for better quality of life. Goal status: INITIAL  2.  Pt will improve grip strength in left hand from unsafe to test to at least 35 lbs for functional use at home and in IADLs. Goal status: INITIAL  3.  Pt will improve A/ROM in Lt RF from 195* to at least 220*, to have functional motion for tasks like reach and grasp.  Goal status: INITIAL  4.  Pt will decrease pain at rest from 8/10 to 2/10 or better to have better sleep and occupational participation in daily roles. Goal status: INITIAL   ASSESSMENT:  CLINICAL IMPRESSION: 08/09/23:  226* TAM now in left ring finger, greatly improving and decreasing pain.  She has a couple of visits left scheduled and this is all she should need to achieve all of her goals.  Carry on   PLAN:  OT FREQUENCY: 1x/week  OT  DURATION: 4 weeks through 08/18/2023 and up to 5 total visits as needed  PLANNED INTERVENTIONS: 97168 OT Re-evaluation, 97535 self care/ADL training, 09811 therapeutic exercise, 97530 therapeutic activity, 97112 neuromuscular re-education, 97140 manual therapy, 97035 ultrasound, 97039 fluidotherapy, 97010 moist heat, 97010 cryotherapy, 97034 contrast bath, 97760 Orthotics management and training, 91478 Splinting (initial encounter), [id number] Subsequent splinting/medication, manual lymph drainage, scar mobilization, compression bandaging, coping strategies training, and patient/family education  CONSULTED AND AGREED WITH PLAN OF CARE: Patient  PLAN FOR NEXT SESSION:   Check new putty pinching, gripping, strengthening activities, check range of motion and ensure she is meeting all of her goals and discharge in the next 1 or 2 visits as appropriate.   Fannie Knee, OTR/L, CHT 08/09/2023, 10:13 AM

## 2023-08-09 ENCOUNTER — Encounter: Payer: Self-pay | Admitting: Rehabilitative and Restorative Service Providers"

## 2023-08-09 ENCOUNTER — Other Ambulatory Visit: Payer: Self-pay | Admitting: Cardiovascular Disease

## 2023-08-09 ENCOUNTER — Ambulatory Visit: Payer: Medicare Other | Admitting: Rehabilitative and Restorative Service Providers"

## 2023-08-09 DIAGNOSIS — M6281 Muscle weakness (generalized): Secondary | ICD-10-CM

## 2023-08-09 DIAGNOSIS — R278 Other lack of coordination: Secondary | ICD-10-CM

## 2023-08-09 DIAGNOSIS — M25642 Stiffness of left hand, not elsewhere classified: Secondary | ICD-10-CM | POA: Diagnosis not present

## 2023-08-09 DIAGNOSIS — M79642 Pain in left hand: Secondary | ICD-10-CM

## 2023-08-09 DIAGNOSIS — R600 Localized edema: Secondary | ICD-10-CM

## 2023-08-10 ENCOUNTER — Other Ambulatory Visit: Payer: Self-pay | Admitting: Family Medicine

## 2023-08-15 NOTE — Telephone Encounter (Signed)
Please check with patient about omeprazole use.  I thought she was on protonix.  Please let me know.  Thanks.

## 2023-08-15 NOTE — Therapy (Signed)
OUTPATIENT OCCUPATIONAL THERAPY TREATMENT NOTE  Patient Name: Allison Yoder MRN: [id number] DOB:July 02, 1951, 72 y.o., female Today's Date: 08/16/2023  PCP: Crawford Givens, MD REFERRING PROVIDER:  Samuella Cota, MD    END OF SESSION:  OT End of Session - 08/16/23 0854     Visit Number 4    Number of Visits 5    Date for OT Re-Evaluation 08/18/23    Authorization Type BCBS Medicare    OT Start Time 0854    OT Stop Time 0927    OT Time Calculation (min) 33 min    Equipment Utilized During Treatment --    Activity Tolerance Patient tolerated treatment well;No increased pain    Behavior During Therapy WFL for tasks assessed/performed              Past Medical History:  Diagnosis Date   Allergy    Anemia    "all the time as a child"   Anxiety    Arthritis    "all my joints; worse in my hands" (10/27/2015)   Chronic lower back pain    Depression    Diverticulosis    Fatty liver    GERD (gastroesophageal reflux disease)    Grade I diastolic dysfunction    Heart murmur    "dx'd years ago; very mild; never treated" (10/27/2015)   Hyperlipidemia    Hypertension    Hypothyroidism 2002-~ 2010   Iritis    per [facility]   Migraines    "stopped when I went thru menopause"   NASH (nonalcoholic steatohepatitis)    OSA on CPAP    Pneumonia ~ 2013; 10/27/2015   Recurrent urinary tract infection    Routine general medical examination at a health care facility 02/26/2014   Snores    Stroke (HCC) 2017   TIAs x2.  No deficits   Type II diabetes mellitus (HCC)    "lost weight; started exercising again; don't have it anymore" (10/27/2015)   Urinary incontinence    Wears glasses    Wears hearing aid in both ears    Past Surgical History:  Procedure Laterality Date   BLEPHAROPLASTY Bilateral 2013; 2016   BREAST LUMPECTOMY Right 1964   benign tumor,   CATARACT EXTRACTION W/PHACO Right 01/31/2023   Procedure: CATARACT EXTRACTION PHACO AND INTRAOCULAR LENS  PLACEMENT (IOC) RIGHT DIABETIC  5.04  00:36.2;  Surgeon: Galen Manila, MD;  Location: [facility];  Service: Ophthalmology;  Laterality: Right;  Diabetic   CATARACT EXTRACTION W/PHACO Left 02/14/2023   Procedure: CATARACT EXTRACTION PHACO AND INTRAOCULAR LENS PLACEMENT (IOC) LEFT DIABETIC  5.73  00:40.1;  Surgeon: Galen Manila, MD;  Location: [facility];  Service: Ophthalmology;  Laterality: Left;  Diabetic   HEMORRHOID BANDING  X 2   I & D EXTREMITY Left 07/05/2023   Procedure: IRRIGATION AND DEBRIDEMENT OF LEFT FOURTH FINGER;  Surgeon: Samuella Cota, MD;  Location: [facility] OR;  Service: Orthopedics;  Laterality: Left;   KNEE ARTHROSCOPY Left 2016   "meniscus repair"   PELVIC FLOOR REPAIR  2001   "lift"   POLYPECTOMY  X 3   "bladder"   SHOULDER ARTHROSCOPY W/ ROTATOR CUFF REPAIR Left 2013   SHOULDER ARTHROSCOPY W/ ROTATOR CUFF REPAIR Right 2015   TUBAL LIGATION  ~ 1986   VAGINAL HYSTERECTOMY  1992   Partial   Patient Active Problem List   Diagnosis Date Noted   Infected finger 07/04/2023   SIRS (systemic inflammatory response syndrome) (HCC) 07/04/2023   Sciatica 05/28/2023  RUQ abdominal pain 05/15/2023   Hand paresthesia 04/30/2023   Urine abnormality 11/19/2022   BPV (benign positional vertigo) 11/19/2022   Sore throat 10/12/2022   Acute non-recurrent frontal sinusitis 09/26/2022   Drug-induced myopathy 02/18/2022   Acute nonsuppurative otitis media of right ear 01/19/2022   Wheezing 01/19/2022   Abnormal liver function 11/16/2021   Diverticulitis of colon 11/16/2021   Hematochezia 11/16/2021   Deviated septum 11/09/2021   Urinary frequency 06/09/2021   SOBOE (shortness of breath on exertion) 06/09/2021   Facial droop 05/20/2021   Dizziness 05/20/2021   LFT elevation 03/28/2021   Right hip pain 01/31/2021   Other social stressor 12/13/2020   Dysphagia 07/22/2020   Abnormal cervical Papanicolaou smear 04/14/2020   Healthcare maintenance  07/22/2019   Cough 07/22/2019   Arthralgia 07/22/2019   Medicare annual wellness visit, initial 06/10/2018   Chest heaviness 06/10/2018   Osteopenia 09/10/2017   Fatty liver 05/19/2017   Diabetes mellitus without complication (HCC) 05/05/2017   History of cerebrovascular accident (CVA) due to ischemia 04/08/2016   Essential hypertension 10/27/2015   Advance care planning 04/29/2015   Obstructive sleep apnea of adult 07/25/2014   Palpitations 01/06/2013   Anxiety and depression 09/01/2012   Shoulder pain 12/19/2011   MDD (major depressive disorder) 12/19/2011   OA (osteoarthritis) 12/19/2011   Hyperlipidemia associated with type 2 diabetes mellitus (HCC) 12/19/2011   Diverticulitis 12/19/2011   Migraines 12/19/2011   Hypothyroid 12/19/2011   Iritis 12/19/2011    ONSET DATE: 07/05/23 DOS  REFERRING DIAG: W09.811 (ICD-10-CM) - Pain in left hand   THERAPY DIAG:  Localized edema  Stiffness of left hand, not elsewhere classified  Muscle weakness (generalized)  Other lack of coordination  Pain in left hand  Rationale for Evaluation and Treatment: Rehabilitation  PERTINENT HISTORY: Per MD notes: " 2 weeks status post irrigation and debridement of left fourth finger. " Was treated for Left ring finger flexor tenosynovitis after cutting her finger with a knife, developing an infection and not responding to antibiotics. She had A1, A2 releases and thorough irrigation from DIP level incision to MCP J area.    PRECAUTIONS: Other: latex and tape prolonged exposure may cause skin irritation  RED FLAGS: None ;  WEIGHT BEARING RESTRICTIONS: Yes <5#    SUBJECTIVE:   SUBJECTIVE STATEMENT: Now 6 weeks post-op, I& D pyogenic tenosynovitis.  She states feeling much better though her scar adhesion and scar tenderness is still her biggest issue at this point.  Her therapy putty exercises and activities have been going well and she feels stronger.   PAIN:  Are you having pain? Yes:  NPRS scale: 4-5/10 - only hurts with exercises Pain location: Hand ring finger Pain description: Aching/sore Aggravating factors: Weight bearing and moving too much Relieving factors: Rest and ice   PATIENT GOALS: To improve the use of her left nondominant hand    OBJECTIVE: (All objective assessments below are from initial evaluation on: 07/20/23 unless otherwise specified.)   HAND DOMINANCE: Right   ADLs: Overall ADLs: States decreased ability to grab, hold household objects, pain and difficulty to open containers, perform FMS tasks (manipulate fasteners on clothing), mild to moderate bathing problems as well.    FUNCTIONAL OUTCOME MEASURES:  Eval: Patient Specific Functional Scale: 1.6 (hold steering wheel, use a broom, open a jar)  (Higher Score  =  Better Ability for the Selected Tasks)      UPPER EXTREMITY ROM     Shoulder to Wrist AROM Left eval  Wrist flexion 70  Wrist extension 70  (Blank rows = not tested)   Hand AROM Left eval Lt 08/02/23 Lt 08/09/23 Lt 08/16/23  Full Fist Ability (or Gap to Distal Palmar Crease) Unable      Thumb Opposition  (Kapandji Scale)  8/10     Ring MCP (0-90) 0- 71 0 - 69 0 - 76 0 - 75  Ring PIP (0-100)  0- 83 0 - 83 0 - 93 0 - 88  Ring DIP (0-70)  0- 41 0 - 44 0 - 57 0 - 55  (Blank rows = not tested)   UPPER EXTREMITY MMT:    Eval: NT at eval due to recent and still healing injuries. Will be tested when appropriate.   MMT Left TBD  Elbow flexion   Elbow extension   Forearm supination   Forearm pronation   Wrist flexion   Wrist extension   (Blank rows = not tested)  HAND FUNCTION: 08/16/23: Grip strength Left: 48.5 lbs   08/09/23: Grip strength Right: 65 lbs, Left: 41 lbs   Eval: Observed weakness in affected Lt hand.    COORDINATION: Eval: No observed coordination impairments with affected Lt hand. 9 Hole Peg Test Left: 21 sec (21 sec is WFL)   SENSATION: Eval:  Light touch present today, though diminished  around sx area and into the sides and tip of her Lt RF   OBSERVATIONS:   08/09/23: 6.9cm about Lt RF P1 (compared to 6.5cm in opposite hand)   08/02/23: 7.0cm about Lt RF P1 (compared to 6.4cm in opposite hand)   Eval: Finger is slightly flushed and swollen not very tender to palpation.  Surgical areas look well-healing with some scabbing still and some dry skin.  Finger generally presents as stiff but does not seem to affect her wrist or adjacent fingers very much.  She does state in show habit of guarding from pain and leaving her digits 4 and 5 extended out when using her hand.  She has no sign of extensor lag at all right now.  This will be monitored and perhaps she may need a nighttime extension brace to help with this or prevent this. (I& D pyogenic tenosynovitis)   TODAY'S TREATMENT:  08/16/23: She performs active range of motion for new measures which shows no significant improvements over the last week, but grip strength is significantly up when tested today.  OT does review her new therapy putty activities with her and she states understanding these and doing well.  As her scar is a bit bulky still and still sensitive, OT does manual therapy vibration technique to decrease pain and try to mobilize the scar as well as negative pressure therapy or cupping over the scar.  She states these are mildly painful but relieving at the same time, and she feels looser and better when these treatments are finished.  Next, OT provides her with therapy bands and exercises for wrist and whole arm strengthening now, as she is 6 weeks postop and she has been withholding the use of her left arm.  She needs to do some whole arm strengthening to return to full function, and she tolerated the strengthening exercises well today with 3 pounds or red therapy bands.  New exercises are bolded below.  She leaves today feeling no significant pain and better after the manual therapy.  Exercises - Wrist Flexion Stretch  -  4 x daily - 3-5 reps - 15 sec hold - Wrist Prayer Stretch  -  4 x daily - 3-5 reps - 15 sec hold - Finger Spreading  - 4-6 x daily - 10-15 reps - Tendon Glides  - 4-6 x daily - 3-5 reps - 2-3 seconds hold - Seated Single Finger Extension  - 4-6 x daily - 10-15 reps - BACK KNUCKLE STRETCHES   - 4 x daily - 3-5 reps - 15 sec hold - HOOK Stretch  - 4 x daily - 3-5 reps - 15-20 sec hold - Seated Finger Composite Flexion Stretch  - 4 x daily - 3-5 reps - 15 hold - PUSH KNUCKLES DOWN  - 4 x daily - 3-5 reps - 15 seconds hold - Full Fist  - 2-3 x daily - 5 reps - Seated Claw Fist with Putty  - 2-3 x daily - 5 reps - Finger Extension "Pizza!"   - 2-3 x daily - 5 reps - Cutting Putty  - 2-3 x daily - 5 reps - Wrist Extension with Resistance  - 2-4 x daily - 1-2 sets - 10-15 reps - Wrist Flexion with Resistance  - 2-4 x daily - 1-2 sets - 10-15 reps - Standing Bicep Curls with Resistance  - 2-4 x daily - 1-2 sets - 10-15 reps - Tricep Kick Back with Resistance  - 2-4 x daily - 1-2 sets - 10-15 reps    PATIENT EDUCATION: Education details: See tx section above for details  Person educated: Patient Education method: Engineer, structural, Teach back, Handouts  Education comprehension: States and demonstrates understanding, Additional Education required    HOME EXERCISE PROGRAM: Access Code: [email or web address] URL: [email or web address] Date: 07/20/2023 Prepared by: Fannie Knee   GOALS: Goals reviewed with patient? Yes   SHORT TERM GOALS: (STG required if POC>30 days) Target Date: 07/27/23  Pt will obtain protective, custom orthotic. Goal status: TBD/PRN  2.  Pt will demo/state understanding of initial HEP to improve pain levels and prerequisite motion. Goal status: INITIAL   LONG TERM GOALS: Target Date: 08/18/23  Pt will improve functional ability by decreased impairment per PSFS assessment from 1.6 to 7 or better, for better quality of life. Goal status: INITIAL   2.   Pt will improve grip strength in left hand from unsafe to test to at least 35 lbs for functional use at home and in IADLs. Goal status: INITIAL  3.  Pt will improve A/ROM in Lt RF from 195* to at least 220*, to have functional motion for tasks like reach and grasp.  Goal status: INITIAL  4.  Pt will decrease pain at rest from 8/10 to 2/10 or better to have better sleep and occupational participation in daily roles. Goal status: INITIAL   ASSESSMENT:  CLINICAL IMPRESSION: 08/16/23: She is doing really well and just has some mild bulky scar that is sensitive, but it is improving.  She is also tolerating gripping and arm strengthening well, and her next visit will probably be a progress note in the discharge for all goals met.    PLAN:  OT FREQUENCY: 1x/week  OT DURATION: 4 weeks through 08/18/2023 and up to 5 total visits as needed  PLANNED INTERVENTIONS: 97168 OT Re-evaluation, 97535 self care/ADL training, 16109 therapeutic exercise, 97530 therapeutic activity, 97112 neuromuscular re-education, 97140 manual therapy, 97035 ultrasound, 97039 fluidotherapy, 97010 moist heat, 97010 cryotherapy, 97034 contrast bath, 97760 Orthotics management and training, 60454 Splinting (initial encounter), [id number] Subsequent splinting/medication, manual lymph drainage, scar mobilization, compression bandaging, coping strategies training, and patient/family education  CONSULTED AND AGREED WITH PLAN OF CARE: Patient  PLAN FOR NEXT SESSION:   In next visit do a progress note and likely discharge as she seems to be doing very well.  Address any last concerns   Fannie Knee, OTR/L, CHT 08/16/2023, 11:35 AM

## 2023-08-15 NOTE — Telephone Encounter (Signed)
Disregard refill request patient is taking protonix.

## 2023-08-16 ENCOUNTER — Encounter: Payer: Self-pay | Admitting: Rehabilitative and Restorative Service Providers"

## 2023-08-16 ENCOUNTER — Ambulatory Visit: Payer: Medicare Other | Admitting: Rehabilitative and Restorative Service Providers"

## 2023-08-16 DIAGNOSIS — M6281 Muscle weakness (generalized): Secondary | ICD-10-CM

## 2023-08-16 DIAGNOSIS — M25642 Stiffness of left hand, not elsewhere classified: Secondary | ICD-10-CM | POA: Diagnosis not present

## 2023-08-16 DIAGNOSIS — R278 Other lack of coordination: Secondary | ICD-10-CM

## 2023-08-16 DIAGNOSIS — M79642 Pain in left hand: Secondary | ICD-10-CM

## 2023-08-16 DIAGNOSIS — R600 Localized edema: Secondary | ICD-10-CM

## 2023-08-16 NOTE — Telephone Encounter (Signed)
Noted. Thanks.

## 2023-08-22 ENCOUNTER — Ambulatory Visit (INDEPENDENT_AMBULATORY_CARE_PROVIDER_SITE_OTHER): Payer: Medicare Other

## 2023-08-22 VITALS — Ht 66.5 in | Wt 183.0 lb

## 2023-08-22 DIAGNOSIS — Z0000 Encounter for general adult medical examination without abnormal findings: Secondary | ICD-10-CM

## 2023-08-22 NOTE — Patient Instructions (Signed)
Allison Yoder , Thank you for taking time to come for your Medicare Wellness Visit. I appreciate your ongoing commitment to your health goals. Please review the following plan we discussed and let me know if I can assist you in the future.   Referrals/Orders/Follow-Ups/Clinician Recommendations: none  This is a list of the screening recommended for you and due dates:  Health Maintenance  Topic Date Due   Zoster (Shingles) Vaccine (1 of 2) 07/20/1969   Yearly kidney health urinalysis for diabetes  06/28/2023   Flu Shot  12/11/2023*   Eye exam for diabetics  09/09/2023   Hemoglobin A1C  10/29/2023   Complete foot exam   11/17/2023   Mammogram  06/15/2024   Yearly kidney function blood test for diabetes  07/04/2024   Medicare Annual Wellness Visit  08/21/2024   Colon Cancer Screening  04/16/2025   DTaP/Tdap/Td vaccine (3 - Td or Tdap) 07/03/2033   Pneumonia Vaccine  Completed   DEXA scan (bone density measurement)  Completed   Hepatitis C Screening  Completed   HPV Vaccine  Aged Out   COVID-19 Vaccine  Discontinued  *Topic was postponed. The date shown is not the original due date.    Advanced directives: (Copy Requested) Please bring a copy of your health care power of attorney and living will to the office to be added to your chart at your convenience.  Next Medicare Annual Wellness Visit scheduled for next year: Yes 08/21/24 @ 8:10am telephone

## 2023-08-22 NOTE — Progress Notes (Signed)
Subjective:   Allison Yoder is a 72 y.o. female who presents for Medicare Annual (Subsequent) preventive examination.  Visit Complete: Virtual I connected with  Allison Yoder on 08/22/23 by a audio enabled telemedicine application and verified that I am speaking with the correct person using two identifiers.  Patient Location: Home  Provider Location: Home Office  I discussed the limitations of evaluation and management by telemedicine. The patient expressed understanding and agreed to proceed.  Vital Signs: Because this visit was a virtual/telehealth visit, some criteria may be missing or patient reported. Any vitals not documented were not able to be obtained and vitals that have been documented are patient reported.  Patient Medicare AWV questionnaire was completed by the patient on (not done); I have confirmed that all information answered by patient is correct and no changes since this date. Cardiac Risk Factors include: advanced age (>24men, >24 women);diabetes mellitus;dyslipidemia;hypertension;sedentary lifestyle    Objective:    Today's Vitals   08/22/23 0815 08/22/23 0817  Weight: 183 lb (83 kg)   Height: 5' 6.5" (1.689 m)   PainSc:  4    Body mass index is 29.09 kg/m.     08/22/2023    8:31 AM 07/20/2023   11:01 AM 07/05/2023   12:39 PM 07/04/2023    3:40 PM 05/24/2023   10:28 AM 05/15/2023   11:20 AM 05/14/2023    9:17 PM  Advanced Directives  Does Patient Have a Medical Advance Directive? Yes Yes No No;Yes Yes Yes Yes  Type of Estate agent of [place];Living will Healthcare Power of [place];Living will Healthcare Power of [place];Living will Healthcare Power of [employer] Power of [employer] Power of [employer] Power of Attorney  Does patient want to make changes to medical advance directive?  No - Patient declined No - Patient declined Yes (Inpatient - patient defers changing a medical advance directive at this  time - Information given)  No - Patient declined   Copy of Healthcare Power of Attorney in Chart? No - copy requested  No - copy requested No - copy requested     Would patient like information on creating a medical advance directive?    No - Patient declined       Current Medications (verified) Outpatient Encounter Medications as of 08/22/2023  Medication Sig   albuterol (VENTOLIN HFA) 108 (90 Base) MCG/ACT inhaler INHALE 1 TO 2 PUFFS INTO THE LUNGS EVERY 6 HOURS AS NEEDED FOR WHEEZING OR SHORTNESS OF BREATH   betamethasone valerate ointment (VALISONE) 0.1 % Apply 1 Application topically daily.   Blood Glucose Monitoring Suppl (ONE TOUCH ULTRA 2) w/Device KIT Check blood sugar once daily and as instructed. Dx E11.9   citalopram (CELEXA) 20 MG tablet TAKE 1 TABLET(20 MG) BY MOUTH DAILY   clonazePAM (KLONOPIN) 0.5 MG tablet Take 1 tablet (0.5 mg total) by mouth daily as needed for anxiety.   cyanocobalamin 1000 MCG tablet Take 1,000 mcg by mouth daily.   cyclobenzaprine (FLEXERIL) 10 MG tablet Take 1 tablet (10 mg total) by mouth daily as needed for muscle spasms.   glucose blood (ONETOUCH ULTRA) test strip as directed to check blood sugar daily. Dx E11.9   insulin glargine (LANTUS SOLOSTAR) 100 UNIT/ML Solostar Pen Inject 50 Units into the skin daily. Sliding scale 52-54 units   Insulin Pen Needle (PEN NEEDLES) 32G X 5 MM MISC Use with insulin pen   Lancets (ONETOUCH DELICA PLUS LANCET33G) MISC USE ONCE DAILY AS DIRECTED  lisinopril (ZESTRIL) 40 MG tablet TAKE 1 TABLET(40 MG) BY MOUTH DAILY   metFORMIN (GLUCOPHAGE-XR) 500 MG 24 hr tablet Take 1 tablet (500 mg total) by mouth 2 (two) times daily with a meal.   metoprolol succinate (TOPROL-XL) 50 MG 24 hr tablet TAKE 1 TABLET BY MOUTH DAILY WITH OR IMMEDIATELY FOLLOWING A MEAL   Multiple Vitamin (MULTIVITAMIN) tablet Take 1 tablet by mouth daily.   pantoprazole (PROTONIX) 40 MG tablet Take 1 tablet (40 mg total) by mouth daily.   REPATHA  SURECLICK 140 MG/ML SOAJ ADMINISTER 1 ML UNDER THE SKIN EVERY 14 DAYS   Vitamin D, Cholecalciferol, 1000 units CAPS Take 3,000 Units by mouth daily.   fluticasone (FLONASE) 50 MCG/ACT nasal spray Place 1 spray into both nostrils daily as needed for allergies or rhinitis.   insulin glargine, 1 Unit Dial, (TOUJEO SOLOSTAR) 300 UNIT/ML Solostar Pen Inject 50 Units into the skin daily. (Patient not taking: Reported on 07/04/2023)   JANUVIA 50 MG tablet TAKE 1 TABLET(50 MG) BY MOUTH DAILY   oxyCODONE (ROXICODONE) 5 MG immediate release tablet Take 1 tablet (5 mg total) by mouth every 6 (six) hours as needed for severe pain (pain score 7-10).   No facility-administered encounter medications on file as of 08/22/2023.    Allergies (verified) Cephalexin, Codeine, Inapsine [droperidol], Crestor [rosuvastatin], Lipitor [atorvastatin calcium], Metformin and related, Niacin and related, Pravastatin, Adhesive [tape], and Latex   History: Past Medical History:  Diagnosis Date   Allergy    Anemia    "all the time as a child"   Anxiety    Arthritis    "all my joints; worse in my hands" (10/27/2015)   Chronic lower back pain    Depression    Diverticulosis    Fatty liver    GERD (gastroesophageal reflux disease)    Grade I diastolic dysfunction    Heart murmur    "dx'd years ago; very mild; never treated" (10/27/2015)   Hyperlipidemia    Hypertension    Hypothyroidism 2002-~ 2010   Iritis    per [facility]   Migraines    "stopped when I went thru menopause"   NASH (nonalcoholic steatohepatitis)    OSA on CPAP    Pneumonia ~ 2013; 10/27/2015   Recurrent urinary tract infection    Routine general medical examination at a health care facility 02/26/2014   Snores    Stroke (HCC) 2017   TIAs x2.  No deficits   Type II diabetes mellitus (HCC)    "lost weight; started exercising again; don't have it anymore" (10/27/2015)   Urinary incontinence    Wears glasses    Wears hearing aid in  both ears    Past Surgical History:  Procedure Laterality Date   BLEPHAROPLASTY Bilateral 2013; 2016   BREAST LUMPECTOMY Right 1964   benign tumor,   CATARACT EXTRACTION W/PHACO Right 01/31/2023   Procedure: CATARACT EXTRACTION PHACO AND INTRAOCULAR LENS PLACEMENT (IOC) RIGHT DIABETIC  5.04  00:36.2;  Surgeon: Galen Manila, MD;  Location: [facility];  Service: Ophthalmology;  Laterality: Right;  Diabetic   CATARACT EXTRACTION W/PHACO Left 02/14/2023   Procedure: CATARACT EXTRACTION PHACO AND INTRAOCULAR LENS PLACEMENT (IOC) LEFT DIABETIC  5.73  00:40.1;  Surgeon: Galen Manila, MD;  Location: [facility];  Service: Ophthalmology;  Laterality: Left;  Diabetic   HEMORRHOID BANDING  X 2   I & D EXTREMITY Left 07/05/2023   Procedure: IRRIGATION AND DEBRIDEMENT OF LEFT FOURTH FINGER;  Surgeon: Samuella Cota,  MD;  Location: [facility] OR;  Service: Orthopedics;  Laterality: Left;   KNEE ARTHROSCOPY Left 2016   "meniscus repair"   PELVIC FLOOR REPAIR  2001   "lift"   POLYPECTOMY  X 3   "bladder"   SHOULDER ARTHROSCOPY W/ ROTATOR CUFF REPAIR Left 2013   SHOULDER ARTHROSCOPY W/ ROTATOR CUFF REPAIR Right 2015   TUBAL LIGATION  ~ 1986   VAGINAL HYSTERECTOMY  1992   Partial   Family History  Problem Relation Age of Onset   Arthritis Mother    Heart disease Mother    Hyperlipidemia Mother    Hypertension Mother    Diabetes Mother    Depression Mother    Stroke Mother    Alcohol abuse Father    Lung cancer Father    Arthritis Maternal Grandmother    Stroke Maternal Grandmother    Diabetes Maternal Grandmother    Heart disease Paternal Uncle        x 2   Arthritis Sister    Breast cancer Cousin    Heart disease Paternal Aunt    Diabetes Paternal Uncle    Diabetes Sister    Heart disease Paternal Uncle        x 5   Heart disease Paternal Aunt        x 3   Stroke Paternal Aunt    Dementia Paternal Grandmother    Colon cancer Neg Hx    Social History    Socioeconomic History   Marital status: Divorced    Spouse name: Not on file   Number of children: 1   Years of education: Not on file   Highest education level: Not on file  Occupational History   Occupation: Dental Receptionist/Assistant    Comment: Dr. Reuel Boom in [place]  Tobacco Use   Smoking status: Former    Current packs/day: 0.00    Average packs/day: 2.0 packs/day for 5.0 years (10.0 ttl pk-yrs)    Types: Cigarettes    Start date: 09/12/1980    Quit date: 09/12/1985    Years since quitting: 37.9   Smokeless tobacco: Never  Vaping Use   Vaping status: Never Used  Substance and Sexual Activity   Alcohol use: Not Currently   Drug use: No   Sexual activity: Yes  Other Topics Concern   Not on file  Social History Narrative   Education:  Medical sales representative   Divorced and lives with her brother.     Caring for her grandson Jean Rosenthal) as of 2023   Social Determinants of Health   Financial Resource Strain: Low Risk  (08/22/2023)   Overall Financial Resource Strain (CARDIA)    Difficulty of Paying Living Expenses: Not hard at all  Food Insecurity: No Food Insecurity (08/22/2023)   Hunger Vital Sign    Worried About Running Out of Food in the Last Year: Never true    Ran Out of Food in the Last Year: Never true  Transportation Needs: No Transportation Needs (08/22/2023)   PRAPARE - Administrator, Civil Service (Medical): No    Lack of Transportation (Non-Medical): No  Physical Activity: Inactive (08/22/2023)   Exercise Vital Sign    Days of Exercise per Week: 0 days    Minutes of Exercise per Session: 0 min  Stress: No Stress Concern Present (08/22/2023)   [employer] of Occupational Health - Occupational Stress Questionnaire    Feeling of Stress : Only a little  Social Connections: Moderately Isolated (08/22/2023)  Social Advertising account executive [NHANES]    Frequency of Communication with Friends and Family: More than three times a  week    Frequency of Social Gatherings with Friends and Family: Never    Attends Religious Services: More than 4 times per year    Active Member of [employer] or Organizations: No    Attends Engineer, structural: Never    Marital Status: Divorced    Tobacco Counseling Counseling given: Not Answered   Clinical Intake:  Pre-visit preparation completed: No  Pain : 0-10 Pain Score: 4  Pain Type: Chronic pain Pain Location: Generalized Pain Descriptors / Indicators: Aching Pain Onset: More than a month ago Pain Frequency: Intermittent Pain Relieving Factors: rest, tylenol, muscle relaxants  Pain Relieving Factors: rest, tylenol, muscle relaxants  BMI - recorded: 29.09 Nutritional Status: BMI 25 -29 Overweight Nutritional Risks: None Diabetes: Yes CBG done?: No Did pt. bring in CBG monitor from home?: No  How often do you need to have someone help you when you read instructions, pamphlets, or other written materials from your doctor or pharmacy?: 1 - Never  Interpreter Needed?: No  Comments: lives with brother and grandson Information entered by :: B.Maisen Klingler,LPN   Activities of Daily Living    08/22/2023    8:31 AM 07/04/2023    3:40 PM  In your present state of health, do you have any difficulty performing the following activities:  Hearing? 0 1  Vision? 0 1  Difficulty concentrating or making decisions? 1 0  Walking or climbing stairs? 0   Dressing or bathing? 0   Doing errands, shopping? 0 0  Preparing Food and eating ? N   Using the Toilet? N   In the past six months, have you accidently leaked urine? Y   Do you have problems with loss of bowel control? N   Managing your Medications? N   Managing your Finances? N   Housekeeping or managing your Housekeeping? N     Patient Care Team: Joaquim Nam, MD as PCP - General (Family Medicine) Wendall Stade, MD as PCP - Cardiology (Cardiology) Vida Rigger, MD as Consulting Physician  (Gastroenterology) Dingeldein, Viviann Spare, MD (Ophthalmology) Loree Fee, [facility] as Pharmacist (Pharmacist)  Indicate any recent Medical Services you may have received from other than [facility] providers in the past year (date may be approximate).     Assessment:   This is a routine wellness examination for [employer].  Hearing/Vision screen Hearing Screening - Comments:: Pt says her hearing is good with aids Vision Screening - Comments:: Pt wears glasses and has good vision Dr Dellie Burns   Goals Addressed             This Visit's Progress    COMPLETED: Increase physical activity   Not on track    Starting 11/11/2016, I will continue to exercise at least 45 min 4 days per week.      Maintain Healthy Lifestyle   On track    Stay active Healthy diet     Patient Stated   Not on track    06/27/2019, I will exercise more and eat a healthier diet.      Patient Stated   On track    Would like to drink more water and lose weight.        Depression Screen    08/22/2023    8:27 AM 05/26/2023    8:12 AM 04/28/2023    4:06 PM 01/05/2023    8:14 AM 11/17/2022  12:13 PM 09/09/2022    4:10 PM 09/07/2021    9:56 AM  PHQ 2/9 Scores  PHQ - 2 Score 0 0 5 6 1  0 0  PHQ- 9 Score  5 18 22 5       Fall Risk    08/22/2023    8:21 AM 05/26/2023    8:12 AM 04/28/2023    4:06 PM 01/05/2023    8:14 AM 11/17/2022   12:13 PM  Fall Risk   Falls in the past year? 1 0 0 1 0  Number falls in past yr: 0 0 0 0 0  Injury with Fall? 0 0 0 0 0  Risk for fall due to : No Fall Risks No Fall Risks No Fall Risks No Fall Risks No Fall Risks  Follow up Education provided;Falls prevention discussed Falls evaluation completed Falls evaluation completed Falls evaluation completed Falls evaluation completed    MEDICARE RISK AT HOME: Medicare Risk at Home Any stairs in or around the home?: Yes If so, are there any without handrails?: Yes Home free of loose throw rugs in walkways, pet beds, electrical cords, etc?:  Yes Adequate lighting in your home to reduce risk of falls?: Yes Life alert?: No Use of a cane, walker or w/c?: No Grab bars in the bathroom?: Yes Shower chair or bench in shower?: Yes Elevated toilet seat or a handicapped toilet?: No  TIMED UP AND GO:  Was the test performed?  No    Cognitive Function:    06/27/2019   10:54 AM 11/11/2016    1:09 PM  MMSE - Mini Mental State Exam  Orientation to time 5 5  Orientation to Place 5 5  Registration 3 3  Attention/ Calculation 5 0  Recall 3 3  Language- name 2 objects  0  Language- repeat 1 1  Language- follow 3 step command  3  Language- read & follow direction  0  Write a sentence  0  Copy design  0  Total score  20        08/22/2023    8:34 AM 09/09/2022    4:28 PM  6CIT Screen  What Year? 0 points 0 points  What month? 0 points 0 points  What time? 0 points 0 points  Count back from 20 0 points 0 points  Months in reverse 0 points 0 points  Repeat phrase 0 points 0 points  Total Score 0 points 0 points    Immunizations Immunization History  Administered Date(s) Administered   Fluad Quad(high Dose 65+) 05/31/2019, 06/22/2020, 06/07/2021, 06/27/2022   Hep A / Hep B 11/16/2021, 01/04/2022, 06/27/2022   Hepatitis B, ADULT 07/19/2013, 08/27/2013, 04/04/2014   Hepb-cpg 07/19/2013, 08/27/2013, 04/04/2014   Influenza, High Dose Seasonal PF 08/01/2018   Influenza,inj,Quad PF,6+ Mos 07/25/2014   Influenza-Unspecified 05/14/2015, 05/13/2016, 06/08/2017   Moderna Covid-19 Vaccine Bivalent Booster 31yrs & up 07/01/2021   PFIZER(Purple Top)SARS-COV-2 Vaccination 10/08/2019, 10/29/2019, 06/22/2020   PNEUMOCOCCAL CONJUGATE-20 08/12/2022   Pneumococcal Conjugate-13 02/26/2014   Pneumococcal Polysaccharide-23 05/06/2016   Tdap 06/25/2012, 07/04/2023   Zoster, Live 09/10/2012    TDAP status: Up to date  Flu Vaccine status: Up to date  Pneumococcal vaccine status: Up to date  Covid-19 vaccine status: Completed  vaccines  Qualifies for Shingles Vaccine? Yes   Zostavax completed No   Shingrix Completed?: No.    Education has been provided regarding the importance of this vaccine. Patient has been advised to call insurance company to determine out of pocket expense  if they have not yet received this vaccine. Advised may also receive vaccine at local pharmacy or Health Dept. Verbalized acceptance and understanding.  Screening Tests Health Maintenance  Topic Date Due   Zoster Vaccines- Shingrix (1 of 2) 07/20/1969   Diabetic kidney evaluation - Urine ACR  06/28/2023   INFLUENZA VACCINE  12/11/2023 (Originally 04/13/2023)   OPHTHALMOLOGY EXAM  09/09/2023   HEMOGLOBIN A1C  10/29/2023   FOOT EXAM  11/17/2023   MAMMOGRAM  06/15/2024   Diabetic kidney evaluation - eGFR measurement  07/04/2024   Medicare Annual Wellness (AWV)  08/21/2024   Colonoscopy  04/16/2025   DTaP/Tdap/Td (3 - Td or Tdap) 07/03/2033   Pneumonia Vaccine 13+ Years old  Completed   DEXA SCAN  Completed   Hepatitis C Screening  Completed   HPV VACCINES  Aged Out   COVID-19 Vaccine  Discontinued    Health Maintenance  Health Maintenance Due  Topic Date Due   Zoster Vaccines- Shingrix (1 of 2) 07/20/1969   Diabetic kidney evaluation - Urine ACR  06/28/2023    Colorectal cancer screening: Type of screening: Colonoscopy. Completed 04/16/2020. Repeat every 5-10 years  Mammogram status: Completed 06/16/23. Repeat every year  Bone Density status: Completed 08/26/2016. Results reflect: Bone density results: OSTEOPENIA. Repeat every 3-5 years.  Lung Cancer Screening: (Low Dose CT Chest recommended if Age 48-80 years, 20 pack-year currently smoking OR have quit w/in 15years.) does not qualify.   Lung Cancer Screening Referral: no  Additional Screening:  Hepatitis C Screening: does not qualify; Completed 04/28/2015  Vision Screening: Recommended annual ophthalmology exams for early detection of glaucoma and other disorders of the  eye. Is the patient up to date with their annual eye exam?  Yes  Who is the provider or what is the name of the office in which the patient attends annual eye exams? Dr Dellie Burns If pt is not established with a provider, would they like to be referred to a provider to establish care? No .   Dental Screening: Recommended annual dental exams for proper oral hygiene  Diabetic Foot Exam: Diabetic Foot Exam: Completed 03/28/23  Community Resource Referral / Chronic Care Management: CRR required this visit?  No   CCM required this visit?  No    Plan:     I have personally reviewed and noted the following in the patient's chart:   Medical and social history Use of alcohol, tobacco or illicit drugs  Current medications and supplements including opioid prescriptions. Patient is not currently taking opioid prescriptions. Functional ability and status Nutritional status Physical activity Advanced directives List of other physicians Hospitalizations, surgeries, and ER visits in previous 12 months Vitals Screenings to include cognitive, depression, and falls Referrals and appointments  In addition, I have reviewed and discussed with patient certain preventive protocols, quality metrics, and best practice recommendations. A written personalized care plan for preventive services as well as general preventive health recommendations were provided to patient.    Sue Lush, LPN   [id number]/04/[id number]   After Visit Summary: (MyChart) Due to this being a telephonic visit, the after visit summary with patients personalized plan was offered to patient via MyChart   Nurse Notes: The patient states she is doing well and has no concerns or questions at this time.

## 2023-08-22 NOTE — Therapy (Signed)
OUTPATIENT OCCUPATIONAL THERAPY TREATMENT & DISCHARGE NOTE  Patient Name: Allison Yoder MRN: [id number] DOB:1951-04-29, 72 y.o., female Today's Date: 08/23/2023  PCP: Crawford Givens, MD REFERRING PROVIDER:  Samuella Cota, MD        OCCUPATIONAL THERAPY DISCHARGE SUMMARY  Visits from Start of Care: 5  08/23/23: She has now met all goals for therapy, has no more significant pain or functional problems and is successfully discharging therapy today.  She will continue with desensitization at home as needed.    PLAN:  OT FREQUENCY: 1x/week  OT DURATION: 1 week, 08/18/2023  - 08/23/23 to cover today's last visit.    Education / Equipment: Pt has all needed materials and education. Pt understands how to continue on with self-management. See tx notes for more details.   Patient agrees to discharge due to max benefits received from outpatient occupational therapy / hand therapy at this time.   Fannie Knee, OTR/L, CHT 08/23/23      END OF SESSION:  OT End of Session - 08/23/23 0845     Visit Number 5    Number of Visits 5    Date for OT Re-Evaluation 08/23/23    Authorization Type BCBS Medicare    OT Start Time 0845    OT Stop Time 0920    OT Time Calculation (min) 35 min    Activity Tolerance Patient tolerated treatment well;No increased pain    Behavior During Therapy WFL for tasks assessed/performed             Past Medical History:  Diagnosis Date   Allergy    Anemia    "all the time as a child"   Anxiety    Arthritis    "all my joints; worse in my hands" (10/27/2015)   Chronic lower back pain    Depression    Diverticulosis    Fatty liver    GERD (gastroesophageal reflux disease)    Grade I diastolic dysfunction    Heart murmur    "dx'd years ago; very mild; never treated" (10/27/2015)   Hyperlipidemia    Hypertension    Hypothyroidism 2002-~ 2010   Iritis    per [facility]   Migraines    "stopped when I went thru menopause"    NASH (nonalcoholic steatohepatitis)    OSA on CPAP    Pneumonia ~ 2013; 10/27/2015   Recurrent urinary tract infection    Routine general medical examination at a health care facility 02/26/2014   Snores    Stroke (HCC) 2017   TIAs x2.  No deficits   Type II diabetes mellitus (HCC)    "lost weight; started exercising again; don't have it anymore" (10/27/2015)   Urinary incontinence    Wears glasses    Wears hearing aid in both ears    Past Surgical History:  Procedure Laterality Date   BLEPHAROPLASTY Bilateral 2013; 2016   BREAST LUMPECTOMY Right 1964   benign tumor,   CATARACT EXTRACTION W/PHACO Right 01/31/2023   Procedure: CATARACT EXTRACTION PHACO AND INTRAOCULAR LENS PLACEMENT (IOC) RIGHT DIABETIC  5.04  00:36.2;  Surgeon: Galen Manila, MD;  Location: [facility];  Service: Ophthalmology;  Laterality: Right;  Diabetic   CATARACT EXTRACTION W/PHACO Left 02/14/2023   Procedure: CATARACT EXTRACTION PHACO AND INTRAOCULAR LENS PLACEMENT (IOC) LEFT DIABETIC  5.73  00:40.1;  Surgeon: Galen Manila, MD;  Location: [facility];  Service: Ophthalmology;  Laterality: Left;  Diabetic   HEMORRHOID BANDING  X 2   I &  D EXTREMITY Left 07/05/2023   Procedure: IRRIGATION AND DEBRIDEMENT OF LEFT FOURTH FINGER;  Surgeon: Samuella Cota, MD;  Location: [facility] OR;  Service: Orthopedics;  Laterality: Left;   KNEE ARTHROSCOPY Left 2016   "meniscus repair"   PELVIC FLOOR REPAIR  2001   "lift"   POLYPECTOMY  X 3   "bladder"   SHOULDER ARTHROSCOPY W/ ROTATOR CUFF REPAIR Left 2013   SHOULDER ARTHROSCOPY W/ ROTATOR CUFF REPAIR Right 2015   TUBAL LIGATION  ~ 1986   VAGINAL HYSTERECTOMY  1992   Partial   Patient Active Problem List   Diagnosis Date Noted   Infected finger 07/04/2023   SIRS (systemic inflammatory response syndrome) (HCC) 07/04/2023   Sciatica 05/28/2023   RUQ abdominal pain 05/15/2023   Hand paresthesia 04/30/2023   Urine abnormality 11/19/2022   BPV (benign  positional vertigo) 11/19/2022   Sore throat 10/12/2022   Acute non-recurrent frontal sinusitis 09/26/2022   Drug-induced myopathy 02/18/2022   Acute nonsuppurative otitis media of right ear 01/19/2022   Wheezing 01/19/2022   Abnormal liver function 11/16/2021   Diverticulitis of colon 11/16/2021   Hematochezia 11/16/2021   Deviated septum 11/09/2021   Urinary frequency 06/09/2021   SOBOE (shortness of breath on exertion) 06/09/2021   Facial droop 05/20/2021   Dizziness 05/20/2021   LFT elevation 03/28/2021   Right hip pain 01/31/2021   Other social stressor 12/13/2020   Dysphagia 07/22/2020   Abnormal cervical Papanicolaou smear 04/14/2020   Healthcare maintenance 07/22/2019   Cough 07/22/2019   Arthralgia 07/22/2019   Medicare annual wellness visit, initial 06/10/2018   Chest heaviness 06/10/2018   Osteopenia 09/10/2017   Fatty liver 05/19/2017   Diabetes mellitus without complication (HCC) 05/05/2017   History of cerebrovascular accident (CVA) due to ischemia 04/08/2016   Essential hypertension 10/27/2015   Advance care planning 04/29/2015   Obstructive sleep apnea of adult 07/25/2014   Palpitations 01/06/2013   Anxiety and depression 09/01/2012   Shoulder pain 12/19/2011   MDD (major depressive disorder) 12/19/2011   OA (osteoarthritis) 12/19/2011   Hyperlipidemia associated with type 2 diabetes mellitus (HCC) 12/19/2011   Diverticulitis 12/19/2011   Migraines 12/19/2011   Hypothyroid 12/19/2011   Iritis 12/19/2011    ONSET DATE: 07/05/23 DOS  REFERRING DIAG: R60.454 (ICD-10-CM) - Pain in left hand   THERAPY DIAG:  Localized edema - Plan: Ot plan of care cert/re-cert  Stiffness of left hand, not elsewhere classified - Plan: Ot plan of care cert/re-cert  Muscle weakness (generalized) - Plan: Ot plan of care cert/re-cert  Other lack of coordination - Plan: Ot plan of care cert/re-cert  Pain in left hand - Plan: Ot plan of care cert/re-cert  Rationale for  Evaluation and Treatment: Rehabilitation  PERTINENT HISTORY: Per MD notes: " 2 weeks status post irrigation and debridement of left fourth finger. " Was treated for Left ring finger flexor tenosynovitis after cutting her finger with a knife, developing an infection and not responding to antibiotics. She had A1, A2 releases and thorough irrigation from DIP level incision to MCP J area.    PRECAUTIONS: Other: latex and tape prolonged exposure may cause skin irritation  RED FLAGS: None ;  WEIGHT BEARING RESTRICTIONS: Yes <5#    SUBJECTIVE:   SUBJECTIVE STATEMENT: Now 7 weeks post-op, I& D pyogenic tenosynovitis.  She states doing well, just a little sore and has a vibration tool coming in the mail today.     PAIN:  Are you having pain? Yes: NPRS scale: 0/10 - only  hurts with exercises up to up to 2/10 Pain location: Hand ring finger Pain description: Aching/sore Aggravating factors: Weight bearing and moving too much Relieving factors: Rest and ice   PATIENT GOALS: To improve the use of her left nondominant hand    OBJECTIVE: (All objective assessments below are from initial evaluation on: 07/20/23 unless otherwise specified.)   HAND DOMINANCE: Right   ADLs: Overall ADLs: States decreased ability to grab, hold household objects, pain and difficulty to open containers, perform FMS tasks (manipulate fasteners on clothing), mild to moderate bathing problems as well.    FUNCTIONAL OUTCOME MEASURES: 08/23/23:  PSFS: 9/10 (hold steering wheel, use a broom, open a jar)  (Higher Score  =  Better Ability for the Selected Tasks)      Eval: Patient Specific Functional Scale: 1.6 (hold steering wheel, use a broom, open a jar)  (Higher Score  =  Better Ability for the Selected Tasks)      UPPER EXTREMITY ROM     Shoulder to Wrist AROM Left eval  Wrist flexion 70  Wrist extension 70  (Blank rows = not tested)   Hand AROM Left eval Lt 08/02/23 Lt 08/09/23 Lt 08/16/23 Lt 08/23/23   Full Fist Ability (or Gap to Distal Palmar Crease) Unable       Thumb Opposition  (Kapandji Scale)  8/10      Ring MCP (0-90) 0- 71 0 - 69 0 - 76 0 - 75 0 - 81  Ring PIP (0-100)  0- 83 0 - 83 0 - 93 0 - 88 0 - 91  Ring DIP (0-70)  0- 41 0 - 44 0 - 57 0 - 55 0 - 64  (Blank rows = not tested)   UPPER EXTREMITY MMT:     MMT Left 08/23/23  RF extension 5/5  RF flexion  5/5  Wrist flexion 5/5  Wrist extension 5/5  (Blank rows = not tested)  HAND FUNCTION: 08/23/23: Lt grip strength 53lbs  08/16/23: Grip strength Left: 48.5 lbs   08/09/23: Grip strength Right: 65 lbs, Left: 41 lbs   Eval: Observed weakness in affected Lt hand.    COORDINATION: Eval: No observed coordination impairments with affected Lt hand. 9 Hole Peg Test Left: 21 sec (21 sec is WFL)   SENSATION: Eval:  Light touch present today, though diminished around sx area and into the sides and tip of her Lt RF   OBSERVATIONS:   08/23/23: 6.8cm about Lt RF P1 (compared to 6.35cm in opposite hand)   08/02/23: 7.0cm about Lt RF P1 (compared to 6.4cm in opposite hand)   Eval: Finger is slightly flushed and swollen not very tender to palpation.  Surgical areas look well-healing with some scabbing still and some dry skin.  Finger generally presents as stiff but does not seem to affect her wrist or adjacent fingers very much.  She does state in show habit of guarding from pain and leaving her digits 4 and 5 extended out when using her hand.  She has no sign of extensor lag at all right now.  This will be monitored and perhaps she may need a nighttime extension brace to help with this or prevent this. (I& D pyogenic tenosynovitis)   TODAY'S TREATMENT:  08/23/23: Pt performs AROM, gripping, and strength with Lt against therapist's resistance for exercise/activities as well as new measures today.  She has now met all goals and has no significant functional problems.  OT also discusses home and functional tasks with  the pt and  reviews goals. Using the complied data, OT also reviews home exercises and provides final recommendations.  Additionally OT performs manual therapy negative pressure cupping for scar mobilization as she states this is very helpful in the last session.  Again today she states feeling looser and relief through her left ring finger.  Pt states understanding all directions and is in agreement that discharge is appropriate today.   PATIENT EDUCATION: Education details: See tx section above for details  Person educated: Patient Education method: Verbal Instruction, Teach back, Handouts  Education comprehension: States and demonstrates understanding   HOME EXERCISE PROGRAM: Access Code: [email or web address] URL: [email or web address] Date: 07/20/2023 Prepared by: Fannie Knee   GOALS: Goals reviewed with patient? Yes   SHORT TERM GOALS: (STG required if POC>30 days) Target Date: 07/27/23  Pt will obtain protective, custom orthotic. Goal status: N/a DC  2.  Pt will demo/state understanding of initial HEP to improve pain levels and prerequisite motion. Goal status: 08/23/23: MET   LONG TERM GOALS: Target Date: 08/18/23  Pt will improve functional ability by decreased impairment per PSFS assessment from 1.6 to 7 or better, for better quality of life. Goal status: 08/23/23: now 9 MET  2.  Pt will improve grip strength in left hand from unsafe to test to at least 35 lbs for functional use at home and in IADLs. Goal status: 08/23/23: MET 53  3.  Pt will improve A/ROM in Lt RF from 195* to at least 220*, to have functional motion for tasks like reach and grasp.  Goal status: 08/23/23: MET 236*   4.  Pt will decrease pain at rest from 8/10 to 2/10 or better to have better sleep and occupational participation in daily roles. Goal status: 08/23/23: MET   ASSESSMENT:  CLINICAL IMPRESSION: 08/23/23: She has now met all goals for therapy, has no more significant pain or functional  problems and is successfully discharging therapy today.  She will continue with desensitization at home as needed.    PLAN:  OT FREQUENCY: 1x/week  OT DURATION: 1 week, 08/18/2023  - 08/23/23 to cover today's last visit.   PLANNED INTERVENTIONS: 97168 OT Re-evaluation, 97535 self care/ADL training, 16109 therapeutic exercise, 97530 therapeutic activity, 97112 neuromuscular re-education, 97140 manual therapy, 97035 ultrasound, 97039 fluidotherapy, 97010 moist heat, 97010 cryotherapy, 97034 contrast bath, 97760 Orthotics management and training, 60454 Splinting (initial encounter), [phone] Subsequent splinting/medication, manual lymph drainage, scar mobilization, compression bandaging, coping strategies training, and patient/family education  CONSULTED AND AGREED WITH PLAN OF CARE: Patient  PLAN FOR NEXT SESSION:   D/C   Fannie Knee, OTR/L, CHT 08/23/2023, 9:27 AM

## 2023-08-23 ENCOUNTER — Ambulatory Visit (INDEPENDENT_AMBULATORY_CARE_PROVIDER_SITE_OTHER): Payer: Medicare Other | Admitting: Orthopedic Surgery

## 2023-08-23 ENCOUNTER — Encounter: Payer: Self-pay | Admitting: Rehabilitative and Restorative Service Providers"

## 2023-08-23 ENCOUNTER — Ambulatory Visit (INDEPENDENT_AMBULATORY_CARE_PROVIDER_SITE_OTHER): Payer: Medicare Other | Admitting: Rehabilitative and Restorative Service Providers"

## 2023-08-23 DIAGNOSIS — L089 Local infection of the skin and subcutaneous tissue, unspecified: Secondary | ICD-10-CM

## 2023-08-23 DIAGNOSIS — R278 Other lack of coordination: Secondary | ICD-10-CM

## 2023-08-23 DIAGNOSIS — M25642 Stiffness of left hand, not elsewhere classified: Secondary | ICD-10-CM | POA: Diagnosis not present

## 2023-08-23 DIAGNOSIS — M6281 Muscle weakness (generalized): Secondary | ICD-10-CM | POA: Diagnosis not present

## 2023-08-23 DIAGNOSIS — R600 Localized edema: Secondary | ICD-10-CM

## 2023-08-23 DIAGNOSIS — M79642 Pain in left hand: Secondary | ICD-10-CM

## 2023-08-23 NOTE — Progress Notes (Signed)
   Allison Yoder - 72 y.o. female MRN [id number]  Date of birth: 08-26-1951  Office Visit Note: Visit Date: 08/23/2023 PCP: Joaquim Nam, MD Referred by: Joaquim Nam, MD  Subjective:  HPI: Allison Yoder is a 72 y.o. female who presents today for follow up 7 weeks status post irrigation and debridement of left fourth finger for flexor tenosynovitis.  She is doing very well overall, has been discharged from occupational therapy at this juncture.  She is very pleased with her progress.  Pertinent ROS were reviewed with the patient and found to be negative unless otherwise specified above in HPI.   Assessment & Plan: Visit Diagnoses: No diagnosis found.  Plan: She has done very well postoperatively.  Glad to see that she has regained near full range of motion of the ring finger without significant discomfort or dysfunction.  I did emphasize, that she should continue doing her home exercises in order to regain complete range of motion, however this may take additional time.  She expressed full understanding, will return to me as needed.  Follow-up: No follow-ups on file.   Meds & Orders: No orders of the defined types were placed in this encounter.  No orders of the defined types were placed in this encounter.    Procedures: No procedures performed       Objective:   Vital Signs: There were no vitals taken for this visit.  Ortho Exam Left hand: - Well-healed volar incisions over the ring finger - Near composite fist, improved passively - Sensation intact distally, hand remains warm well-perfused  Imaging: No results found.   Allison Yoder, M.D. [facility] 9:41 AM

## 2023-08-30 ENCOUNTER — Encounter: Payer: Self-pay | Admitting: Pharmacist

## 2023-08-30 NOTE — Progress Notes (Addendum)
Manufacturer Assistance Program (MAP) Application   Manufacturer: [employer]    (Re-enrollment) Medication(s): Lantus  Patient Portion of Application:  12/18: Dropped off at clinic by patient.   Provider Portion of Application:  12/18: Provider portion placed in PCP inbox for signature. Prescription(s): Included in MAP application.   Application Status: Submitted via fax (pending approval) 2/4: Called [employer] who report they received application renewal sent back in early January. They report that application looks okay w/o errors. They are "behind" on processing renewals and cannot give an estimate of renewal timeline. They assured patient and provider (fax) will receive letter upon approval.  2/11: [employer] reports same as above   Next Steps: [x]    PCP signature [x]    Upon signature(s) Application to be faxed to [employer]: [phone] AND scanned into patient chart []    Fax confirmation of approval received  Note routed to PCP Clinic Pool to ensure PCP signature is obtained and application is faxed.  *[facility] clinic team - Please Addend/update + this note as the "Next Steps" are completed in office*

## 2023-08-31 ENCOUNTER — Telehealth: Payer: Self-pay

## 2023-08-31 NOTE — Progress Notes (Signed)
I'll work on the hard copy.  Thanks.  

## 2023-09-01 ENCOUNTER — Encounter: Payer: Self-pay | Admitting: Pharmacist

## 2023-09-10 ENCOUNTER — Other Ambulatory Visit: Payer: Self-pay | Admitting: Family Medicine

## 2023-09-10 DIAGNOSIS — E119 Type 2 diabetes mellitus without complications: Secondary | ICD-10-CM

## 2023-09-10 DIAGNOSIS — M8580 Other specified disorders of bone density and structure, unspecified site: Secondary | ICD-10-CM

## 2023-09-12 DIAGNOSIS — R933 Abnormal findings on diagnostic imaging of other parts of digestive tract: Secondary | ICD-10-CM | POA: Diagnosis not present

## 2023-09-12 DIAGNOSIS — K5732 Diverticulitis of large intestine without perforation or abscess without bleeding: Secondary | ICD-10-CM | POA: Diagnosis not present

## 2023-09-12 DIAGNOSIS — K5730 Diverticulosis of large intestine without perforation or abscess without bleeding: Secondary | ICD-10-CM | POA: Diagnosis not present

## 2023-09-12 LAB — HM COLONOSCOPY

## 2023-09-15 ENCOUNTER — Other Ambulatory Visit: Payer: Medicare Other

## 2023-09-19 ENCOUNTER — Other Ambulatory Visit: Payer: Medicare Other

## 2023-09-20 ENCOUNTER — Other Ambulatory Visit: Payer: Medicare Other

## 2023-09-21 ENCOUNTER — Other Ambulatory Visit: Payer: Medicare Other

## 2023-09-21 DIAGNOSIS — E119 Type 2 diabetes mellitus without complications: Secondary | ICD-10-CM

## 2023-09-21 DIAGNOSIS — M8580 Other specified disorders of bone density and structure, unspecified site: Secondary | ICD-10-CM | POA: Diagnosis not present

## 2023-09-21 LAB — COMPREHENSIVE METABOLIC PANEL
ALT: 39 U/L — ABNORMAL HIGH (ref 0–35)
AST: 29 U/L (ref 0–37)
Albumin: 4.3 g/dL (ref 3.5–5.2)
Alkaline Phosphatase: 133 U/L — ABNORMAL HIGH (ref 39–117)
BUN: 20 mg/dL (ref 6–23)
CO2: 29 meq/L (ref 19–32)
Calcium: 9.3 mg/dL (ref 8.4–10.5)
Chloride: 102 meq/L (ref 96–112)
Creatinine, Ser: 0.72 mg/dL (ref 0.40–1.20)
GFR: 83.09 mL/min (ref >60.00)
Glucose, Bld: 192 mg/dL — ABNORMAL HIGH (ref 70–99)
Potassium: 4.3 meq/L (ref 3.5–5.1)
Sodium: 137 meq/L (ref 135–145)
Total Bilirubin: 0.6 mg/dL (ref 0.2–1.2)
Total Protein: 6.8 g/dL (ref 6.0–8.3)

## 2023-09-21 LAB — LIPID PANEL
Cholesterol: 176 mg/dL (ref 0–200)
HDL: 34.2 mg/dL — ABNORMAL LOW (ref >39.00)
LDL Cholesterol: 85 mg/dL (ref 0–99)
NonHDL: 141.49
Total CHOL/HDL Ratio: 5
Triglycerides: 281 mg/dL — ABNORMAL HIGH (ref 0.0–149.0)
VLDL: 56.2 mg/dL — ABNORMAL HIGH (ref 0.0–40.0)

## 2023-09-21 LAB — VITAMIN D 25 HYDROXY (VIT D DEFICIENCY, FRACTURES): VITD: 53.48 ng/mL (ref 30.00–100.00)

## 2023-09-21 LAB — CBC WITH DIFFERENTIAL/PLATELET
Basophils Absolute: 0 10*3/uL (ref 0.0–0.1)
Basophils Relative: 0.7 % (ref 0.0–3.0)
Eosinophils Absolute: 0.1 10*3/uL (ref 0.0–0.7)
Eosinophils Relative: 1.7 % (ref 0.0–5.0)
HCT: 43.2 % (ref 36.0–46.0)
Hemoglobin: 14.2 g/dL (ref 12.0–15.0)
Lymphocytes Relative: 30.7 % (ref 12.0–46.0)
Lymphs Abs: 2.1 10*3/uL (ref 0.7–4.0)
MCHC: 32.9 g/dL (ref 30.0–36.0)
MCV: 90.5 fL (ref 78.0–100.0)
Monocytes Absolute: 0.4 10*3/uL (ref 0.1–1.0)
Monocytes Relative: 6.2 % (ref 3.0–12.0)
Neutro Abs: 4.1 10*3/uL (ref 1.4–7.7)
Neutrophils Relative %: 60.7 % (ref 43.0–77.0)
Platelets: 162 10*3/uL (ref 150.0–400.0)
RBC: 4.78 Mil/uL (ref 3.87–5.11)
RDW: 13.9 % (ref 11.5–15.5)
WBC: 6.7 10*3/uL (ref 4.0–10.5)

## 2023-09-21 LAB — MICROALBUMIN / CREATININE URINE RATIO
Creatinine,U: 131.2 mg/dL
Microalb Creat Ratio: 3.4 mg/g (ref 0.0–30.0)
Microalb, Ur: 4.4 mg/dL — ABNORMAL HIGH (ref 0.0–1.9)

## 2023-09-21 LAB — VITAMIN B12: Vitamin B-12: 1001 pg/mL — ABNORMAL HIGH (ref 211–911)

## 2023-09-21 LAB — TSH: TSH: 2.99 u[IU]/mL (ref 0.35–5.50)

## 2023-09-21 LAB — HEMOGLOBIN A1C: Hgb A1c MFr Bld: 8.5 % — ABNORMAL HIGH (ref 4.6–6.5)

## 2023-09-22 ENCOUNTER — Encounter: Payer: Medicare Other | Admitting: Family Medicine

## 2023-09-26 ENCOUNTER — Ambulatory Visit (INDEPENDENT_AMBULATORY_CARE_PROVIDER_SITE_OTHER): Payer: Medicare Other | Admitting: Family Medicine

## 2023-09-26 VITALS — BP 118/64 | HR 74 | Temp 99.2°F | Ht 66.34 in | Wt 184.2 lb

## 2023-09-26 DIAGNOSIS — I10 Essential (primary) hypertension: Secondary | ICD-10-CM

## 2023-09-26 DIAGNOSIS — E1169 Type 2 diabetes mellitus with other specified complication: Secondary | ICD-10-CM | POA: Diagnosis not present

## 2023-09-26 DIAGNOSIS — F32A Depression, unspecified: Secondary | ICD-10-CM

## 2023-09-26 DIAGNOSIS — G720 Drug-induced myopathy: Secondary | ICD-10-CM

## 2023-09-26 DIAGNOSIS — E119 Type 2 diabetes mellitus without complications: Secondary | ICD-10-CM

## 2023-09-26 DIAGNOSIS — M25569 Pain in unspecified knee: Secondary | ICD-10-CM

## 2023-09-26 DIAGNOSIS — Z7189 Other specified counseling: Secondary | ICD-10-CM

## 2023-09-26 DIAGNOSIS — F419 Anxiety disorder, unspecified: Secondary | ICD-10-CM

## 2023-09-26 DIAGNOSIS — Z0000 Encounter for general adult medical examination without abnormal findings: Secondary | ICD-10-CM

## 2023-09-26 DIAGNOSIS — Z7984 Long term (current) use of oral hypoglycemic drugs: Secondary | ICD-10-CM

## 2023-09-26 DIAGNOSIS — E785 Hyperlipidemia, unspecified: Secondary | ICD-10-CM

## 2023-09-26 DIAGNOSIS — Z794 Long term (current) use of insulin: Secondary | ICD-10-CM

## 2023-09-26 MED ORDER — METOPROLOL SUCCINATE ER 50 MG PO TB24: ORAL_TABLET | ORAL | 3 refills | Status: DC

## 2023-09-26 MED ORDER — LISINOPRIL 40 MG PO TABS: 40.0000 mg | ORAL_TABLET | Freq: Every day | ORAL | 3 refills | Status: DC

## 2023-09-26 MED ORDER — CITALOPRAM HYDROBROMIDE 20 MG PO TABS: 30.0000 mg | ORAL_TABLET | Freq: Every day | ORAL | 3 refills | Status: DC

## 2023-09-26 MED ORDER — PANTOPRAZOLE SODIUM 40 MG PO TBEC: 40.0000 mg | DELAYED_RELEASE_TABLET | Freq: Every day | ORAL | 3 refills | Status: DC

## 2023-09-26 NOTE — Progress Notes (Signed)
 She had flu/covid and RSV vaccines- 2024- she thought at [facility] at [place].    Flu UTD Shingles 2013 PNA up-to-date Tetanus 2024 Colonoscopy 2024 Breast cancer screening prev done.  Bone density testing per gynecology clinic.  Advance directive-sister Debbie to be designated if patient were incapacitated.  Diabetes:  Using medications without difficulties: yes Hypoglycemic episodes: rare, with prolonged fasting. Cautions d/w pt.  Hyperglycemic episodes: no sx Feet problems: no Blood Sugars averaging: not checked.   eye exam within last year: yes- [facility]  She was able to get her meter working at the OV today.  AV helped her with that.   She is still on insulin  and metformin .  Off januvia .   I told her I would check on insulin  assistance re: lantus  vs toujeo  here at clinic.   Hypertension:    Using medication without problems or lightheadedness: yes Chest pain with exertion:no Edema: no Short of breath: no change from prior baseline.   Elevated Cholesterol: Using medications without problems: yes Muscle aches: no Diet compliance: d/w pt.  Exercise: d/w pt.   Social stressors d/w pt.  Mood d/w pt.  She is still caring for her grandson Jaxon.  She is still in counseling with him.  Jaxon's father went to a program last year and that helped and it's like I'm getting my son back.  But she is still exhausted.  She is trying to work from home and that is an adjustment.  D/w pt about citalopram  dose.    Medial R knee pain with crepitus and click on ROM.  No trauma.  Not worse but not getting better.  Pain with going up steps.    PMH and SH reviewed.   Vital signs, Meds and allergies reviewed.  ROS: Per HPI unless specifically indicated in ROS section   GEN: nad, alert and oriented HEENT: ncat NECK: supple w/o LA CV: rrr PULM: ctab, no inc wob ABD: soft, +bs EXT: no edema SKIN: well perfused.  Medial R knee pain with crepitus and click on ROM.  Cyst on L 3rd  toe. Doesn't appear infected.

## 2023-09-26 NOTE — Patient Instructions (Addendum)
 Recheck A1c at a visit in about 3 months.   Try 30mg  of citalopram and let me know if that isn't helping.  Take care.  Glad to see you. Try tylenol and icing for 5 minutes on your knee.

## 2023-09-27 DIAGNOSIS — M25569 Pain in unspecified knee: Secondary | ICD-10-CM | POA: Insufficient documentation

## 2023-09-27 NOTE — Assessment & Plan Note (Signed)
 She had flu/covid and RSV vaccines- 2024- she thought at [facility] at [place].    Flu UTD Shingles 2013 PNA up-to-date Tetanus 2024 Colonoscopy 2024 Breast cancer screening prev done.  Bone density testing per gynecology clinic.  Advance directive-sister Debbie to be designated if patient were incapacitated.

## 2023-09-27 NOTE — Assessment & Plan Note (Signed)
 Asked her to try tylenol  and icing for 5 minutes on the knee.  Likely OA.

## 2023-09-27 NOTE — Assessment & Plan Note (Signed)
Continue repatha.

## 2023-09-27 NOTE — Assessment & Plan Note (Signed)
 She was able to get her meter working at the [facility] today.  AV helped her with that.   She is still on insulin  and metformin .  Off januvia .   Continue as is.  I'll check with staff re: insulin  assistance.

## 2023-09-27 NOTE — Assessment & Plan Note (Signed)
 Continue metoprolol  and lisinopril . Labs d/w pt.

## 2023-09-27 NOTE — Assessment & Plan Note (Signed)
Advance directive-sister Debbie to be designated if patient were incapacitated.

## 2023-09-27 NOTE — Assessment & Plan Note (Signed)
 Inc citalopram  to 30mg  and update me as needed.  Okay for outpatient f/u.

## 2023-09-27 NOTE — Assessment & Plan Note (Signed)
 Statin intolerant

## 2023-09-27 NOTE — Telephone Encounter (Signed)
 I talked with patient at [facility].  She is on lantus , not on tresiba or januvia .  We didn't change her meds since getting her meter working should help with management.  I would appreciate your help on med assistance.  Many thanks.

## 2023-09-28 ENCOUNTER — Other Ambulatory Visit: Payer: Self-pay

## 2023-09-28 ENCOUNTER — Encounter: Payer: Self-pay | Admitting: Family Medicine

## 2023-10-12 DIAGNOSIS — G4733 Obstructive sleep apnea (adult) (pediatric): Secondary | ICD-10-CM | POA: Diagnosis not present

## 2023-10-16 ENCOUNTER — Telehealth: Payer: Self-pay | Admitting: Family Medicine

## 2023-10-16 MED ORDER — LANTUS SOLOSTAR 100 UNIT/ML ~~LOC~~ SOPN: 50.0000 [IU] | PEN_INJECTOR | Freq: Every day | SUBCUTANEOUS | 1 refills | Status: DC

## 2023-10-16 NOTE — Telephone Encounter (Unsigned)
Copied from CRM [id number]. Topic: Clinical - Medication Question >> Oct 16, 2023 12:44 PM Corin V wrote: Reason for CRM: Patient called and stated she used her last dose of insulin yesterday and that she always picks it up from the office. Called [employer] and they were unable to find her Lantus with other medications in office. Please call patient back by end of day with instructions on how she can get additional insulin or if any can be received from the pharmacy today for her.

## 2023-10-16 NOTE — Addendum Note (Signed)
Addended by: Joaquim Nam on: 10/16/2023 03:09 PM   Modules accepted: Orders

## 2023-10-16 NOTE — Telephone Encounter (Signed)
Spoke with patient to advise that an rx in the meantime has been sent to the pharmacy. Allison Yoder can you check on the status of the patients assistance for the lantus please.

## 2023-10-16 NOTE — Telephone Encounter (Signed)
Pt called asking if her lantus meds were available in office for pickup? Please advise. Call back # [phone]

## 2023-10-16 NOTE — Telephone Encounter (Signed)
Please check with patient.  I sent the rx locally.  What is the status on Pt assistance?

## 2023-10-17 NOTE — Telephone Encounter (Signed)
Left message for pt to see it she still want pap for Lantus and Januvia

## 2023-10-19 NOTE — Telephone Encounter (Signed)
 Left voicemail for patient to return call to office.

## 2023-10-20 DIAGNOSIS — E1165 Type 2 diabetes mellitus with hyperglycemia: Secondary | ICD-10-CM | POA: Diagnosis not present

## 2023-10-20 DIAGNOSIS — Z794 Long term (current) use of insulin: Secondary | ICD-10-CM | POA: Diagnosis not present

## 2023-10-20 NOTE — Telephone Encounter (Signed)
 Left voicemail for patient to return call to office.

## 2023-10-23 NOTE — Telephone Encounter (Signed)
 Patient notified

## 2023-10-24 ENCOUNTER — Telehealth: Payer: Self-pay

## 2023-10-24 NOTE — Telephone Encounter (Signed)
Januvia was discontinued by provider no RENEWAL IS NEEDED AT THIS TIME PER [facility]     PLEASE BE ADVISED

## 2023-10-25 DIAGNOSIS — H26492 Other secondary cataract, left eye: Secondary | ICD-10-CM | POA: Diagnosis not present

## 2023-10-25 DIAGNOSIS — E119 Type 2 diabetes mellitus without complications: Secondary | ICD-10-CM | POA: Diagnosis not present

## 2023-10-25 DIAGNOSIS — H35371 Puckering of macula, right eye: Secondary | ICD-10-CM | POA: Diagnosis not present

## 2023-10-25 DIAGNOSIS — Z961 Presence of intraocular lens: Secondary | ICD-10-CM | POA: Diagnosis not present

## 2023-10-25 LAB — HM DIABETES EYE EXAM

## 2023-10-29 ENCOUNTER — Other Ambulatory Visit: Payer: Self-pay | Admitting: Family Medicine

## 2023-11-02 ENCOUNTER — Telehealth: Payer: Self-pay

## 2023-11-02 NOTE — Telephone Encounter (Signed)
Called patient to advise that her 3 boxes of Lantus is available for picku[

## 2023-11-03 ENCOUNTER — Encounter: Payer: Self-pay | Admitting: Family Medicine

## 2023-11-08 DIAGNOSIS — K7460 Unspecified cirrhosis of liver: Secondary | ICD-10-CM | POA: Diagnosis not present

## 2023-11-08 DIAGNOSIS — K7581 Nonalcoholic steatohepatitis (NASH): Secondary | ICD-10-CM | POA: Diagnosis not present

## 2023-11-08 DIAGNOSIS — C220 Liver cell carcinoma: Secondary | ICD-10-CM | POA: Diagnosis not present

## 2023-11-09 ENCOUNTER — Encounter: Payer: Self-pay | Admitting: General Practice

## 2023-11-09 ENCOUNTER — Ambulatory Visit: Payer: Self-pay | Admitting: Family Medicine

## 2023-11-09 ENCOUNTER — Ambulatory Visit (INDEPENDENT_AMBULATORY_CARE_PROVIDER_SITE_OTHER): Payer: Medicare Other | Admitting: General Practice

## 2023-11-09 VITALS — BP 138/80 | HR 90 | Temp 98.1°F | Wt 187.0 lb

## 2023-11-09 DIAGNOSIS — R110 Nausea: Secondary | ICD-10-CM | POA: Insufficient documentation

## 2023-11-09 DIAGNOSIS — K5732 Diverticulitis of large intestine without perforation or abscess without bleeding: Secondary | ICD-10-CM | POA: Diagnosis not present

## 2023-11-09 DIAGNOSIS — R1032 Left lower quadrant pain: Secondary | ICD-10-CM | POA: Diagnosis not present

## 2023-11-09 LAB — CBC WITH DIFFERENTIAL/PLATELET
Basophils Absolute: 0.1 10*3/uL (ref 0.0–0.1)
Basophils Relative: 0.4 % (ref 0.0–3.0)
Eosinophils Absolute: 0.1 10*3/uL (ref 0.0–0.7)
Eosinophils Relative: 0.5 % (ref 0.0–5.0)
HCT: 43.1 % (ref 36.0–46.0)
Hemoglobin: 14.1 g/dL (ref 12.0–15.0)
Lymphocytes Relative: 18.2 % (ref 12.0–46.0)
Lymphs Abs: 2.5 10*3/uL (ref 0.7–4.0)
MCHC: 32.8 g/dL (ref 30.0–36.0)
MCV: 91.1 fL (ref 78.0–100.0)
Monocytes Absolute: 0.9 10*3/uL (ref 0.1–1.0)
Monocytes Relative: 6.8 % (ref 3.0–12.0)
Neutro Abs: 10.2 10*3/uL — ABNORMAL HIGH (ref 1.4–7.7)
Neutrophils Relative %: 74.1 % (ref 43.0–77.0)
Platelets: 192 10*3/uL (ref 150.0–400.0)
RBC: 4.73 Mil/uL (ref 3.87–5.11)
RDW: 14.7 % (ref 11.5–15.5)
WBC: 13.8 10*3/uL — ABNORMAL HIGH (ref 4.0–10.5)

## 2023-11-09 LAB — COMPREHENSIVE METABOLIC PANEL
ALT: 33 U/L (ref 0–35)
AST: 22 U/L (ref 0–37)
Albumin: 4.3 g/dL (ref 3.5–5.2)
Alkaline Phosphatase: 107 U/L (ref 39–117)
BUN: 12 mg/dL (ref 6–23)
CO2: 29 meq/L (ref 19–32)
Calcium: 9.2 mg/dL (ref 8.4–10.5)
Chloride: 101 meq/L (ref 96–112)
Creatinine, Ser: 0.77 mg/dL (ref 0.40–1.20)
GFR: 76.59 mL/min (ref >60.00)
Glucose, Bld: 202 mg/dL — ABNORMAL HIGH (ref 70–99)
Potassium: 3.9 meq/L (ref 3.5–5.1)
Sodium: 137 meq/L (ref 135–145)
Total Bilirubin: 0.8 mg/dL (ref 0.2–1.2)
Total Protein: 7.3 g/dL (ref 6.0–8.3)

## 2023-11-09 MED ORDER — AMOXICILLIN-POT CLAVULANATE 875-125 MG PO TABS: 1.0000 | ORAL_TABLET | Freq: Two times a day (BID) | ORAL | 0 refills | Status: DC

## 2023-11-09 MED ORDER — ONDANSETRON 4 MG PO TBDP: 4.0000 mg | ORAL_TABLET | Freq: Three times a day (TID) | ORAL | 0 refills | Status: DC | PRN

## 2023-11-09 NOTE — Telephone Encounter (Signed)
 Chief Complaint: abdominal  pain  Symptoms: diarrhea Frequency: comes and goes   Disposition: [] ED /[] Urgent Care (no appt availability in office) / [x] Appointment(In office/virtual)/ []  [facility]/ [] Home Care/ [] Refused Recommended Disposition /[] [place]/ []  Follow-up with PCP Additional Notes: Pt calling with abdominal pain on left side of stomach-hip high that started at 0400 today. Pt states it is her "diverticulitis flaring up and needs antibiotic." Pt has had diarrhea for last 2 weeks, but no pain. Pt is currently in pain and rated 4-6. Pt has had some mild nausea but not vomiting.  Per protocol, pt to see provider within 4 hours. Pt has appt at 1000 this morning. RN gave care advice and pt verbalized understanding.              Copied from CRM [id number]. Topic: Clinical - Red Word Triage >> Nov 09, 2023  8:54 AM Irine Seal wrote: [facility] that prompted transfer to Nurse Triage: Patient requesting a refill for amoxicillin-clavulanate (Augmentin) 875-125 mg tablet for a severe case of diverticulitis. She reports excruciating left-sided abdominal pain, which woke her up this morning. The pain is described as throbbing. Patient has a history of diverticulitis and is certain this is the cause of her symptoms. She also mentions experiencing weird bowel movements, loose stools, or diarrhea over the past 2 weeks. She states she is doing the liquid diet. Reason for Disposition  [1] MILD-MODERATE pain AND [2] constant AND [3] present > 2 hours  Answer Assessment - Initial Assessment Questions 1. LOCATION: "Where does it hurt?"      Center of stomach but at hip level on left side  2. RADIATION: "Does the pain shoot anywhere else?" (e.g., chest, back)     no 3. ONSET: "When did the pain begin?" (e.g., minutes, hours or days ago)      0400 4. SUDDEN: "Gradual or sudden onset?"     Sudden  5. PATTERN "Does the pain come and go, or is it constant?"    - If it  comes and goes: "How long does it last?" "Do you have pain now?"     (Note: Comes and goes means the pain is intermittent. It goes away completely between bouts.)    - If constant: "Is it getting better, staying the same, or getting worse?"      (Note: Constant means the pain never goes away completely; most serious pain is constant and gets worse.)      Comes and goes  6. SEVERITY: "How bad is the pain?"  (e.g., Scale 1-10; mild, moderate, or severe)    - MILD (1-3): Doesn't interfere with normal activities, abdomen soft and not tender to touch..     - MODERATE (4-7): Interferes with normal activities or awakens from sleep, abdomen tender to touch.     - SEVERE (8-10): Excruciating pain, doubled over, unable to do any normal activities.       4-6 7. RECURRENT SYMPTOM: "Have you ever had this type of stomach pain before?" If Yes, ask: "When was the last time?" and "What happened that time?"      Yes, diverticulitis 8. AGGRAVATING FACTORS: "Does anything seem to cause this pain?" (e.g., foods, stress, alcohol)     Moving, pressing 9. CARDIAC SYMPTOMS: "Do you have any of the following symptoms: chest pain, difficulty breathing, sweating, nausea?"     Nausea  10. OTHER SYMPTOMS: "Do you have any other symptoms?" (e.g., back pain, diarrhea, fever, urination pain, vomiting)  Diarrhea off and on for last 2 weeks, gassy  Protocols used: Abdominal Pain - Upper-A-AH

## 2023-11-09 NOTE — Patient Instructions (Addendum)
 Stop by the lab prior to leaving today. I will notify you of your results once received.   Start Augmentin antibiotics. Take 1 tablet by mouth twice daily for 10 days.  Please schedule follow up with PCP if symptoms worsen or do not improve.   It was a pleasure meeting you!

## 2023-11-09 NOTE — Progress Notes (Signed)
 Established Patient Office Visit  Subjective   Patient ID: Allison Yoder, female    DOB: [birth year]  Age: 73 y.o. MRN: [id number]  Chief Complaint  Patient presents with   Diverticulitis    Patient having flare and requesting abx to be refilled. Left side pain since about 4 am this morning.     HPI  Allison Yoder is a 73 year old female, patient of Dr. Para March, with past medical history of HTN, fatty liver, divirticulitis, HLD, hypothyroid, DM, MDD, presents today for an acute visit to discuss abdominal pain.   Symptom onset was two weeks ago with nausea, diarrhea. She started to have LLQ pain which is radiating to the RLQ. She describes it as a dull and constant pain but with movement, it is sharp and shooting pain. Tender to touch. She has started a liquid diet. She has had diverticulitis in the past and this feels like a flare-up. No fever, chills, chest pain.   Patient Active Problem List   Diagnosis Date Noted   LLQ pain 11/09/2023   Nausea 11/09/2023   Knee pain 09/27/2023   Sciatica 05/28/2023   BPV (benign positional vertigo) 11/19/2022   Drug-induced myopathy 02/18/2022   Diverticulitis of colon 11/16/2021   LFT elevation 03/28/2021   Other social stressor 12/13/2020   Dysphagia 07/22/2020   Abnormal cervical Papanicolaou smear 04/14/2020   Healthcare maintenance 07/22/2019   Arthralgia 07/22/2019   Medicare annual wellness visit, initial 06/10/2018   Osteopenia 09/10/2017   Fatty liver 05/19/2017   Diabetes mellitus without complication (HCC) 05/05/2017   History of cerebrovascular accident (CVA) due to ischemia 04/08/2016   Essential hypertension 10/27/2015   Advance care planning 04/29/2015   Obstructive sleep apnea of adult 07/25/2014   Palpitations 01/06/2013   Anxiety and depression 09/01/2012   MDD (major depressive disorder) 12/19/2011   OA (osteoarthritis) 12/19/2011   Hyperlipidemia associated with type 2 diabetes mellitus (HCC) 12/19/2011    Migraines 12/19/2011   Hypothyroid 12/19/2011   Iritis 12/19/2011   Past Medical History:  Diagnosis Date   Allergy    Anemia    "all the time as a child"   Anxiety    Arthritis    "all my joints; worse in my hands" (10/27/2015)   Chronic lower back pain    Depression    Diverticulosis    Fatty liver    GERD (gastroesophageal reflux disease)    Grade I diastolic dysfunction    Heart murmur    "dx'd years ago; very mild; never treated" (10/27/2015)   Hyperlipidemia    Hypertension    Hypothyroidism 2002-~ 2010   Iritis    per [facility]   Migraines    "stopped when I went thru menopause"   NASH (nonalcoholic steatohepatitis)    OSA on CPAP    Pneumonia ~ 2013; 10/27/2015   Recurrent urinary tract infection    Routine general medical examination at a health care facility 02/26/2014   Snores    Stroke (HCC) 2017   TIAs x2.  No deficits   Type II diabetes mellitus (HCC)    "lost weight; started exercising again; don't have it anymore" (10/27/2015)   Urinary incontinence    Wears glasses    Wears hearing aid in both ears    Allergies  Allergen Reactions   Cephalexin Itching    Does tolerate augmentin   Codeine Itching and Rash   Inapsine [Droperidol] Shortness Of Breath, Anxiety and Hypertension    Elevated HR  and BP, panic attack   Crestor [Rosuvastatin]     Aches   Lipitor [Atorvastatin Calcium]     Myalgias    Metformin And Related     Does not tolerate high-dose metformin due to GI symptoms.   Niacin And Related     Flushing   Pravastatin     Myalgias   Adhesive [Tape] Rash    Blisters with tape   Latex Rash    When gloves are personally worn   Wound Dressing Adhesive Rash         11/09/2023   10:24 AM 09/26/2023   11:27 AM 08/22/2023    8:27 AM  Depression screen PHQ 2/9  Decreased Interest 1 3 0  Down, Depressed, Hopeless 1 2 0  PHQ - 2 Score 2 5 0  Altered sleeping 1 2   Tired, decreased energy 2 3   Change in appetite 2 3   Feeling  bad or failure about yourself  2 2   Trouble concentrating 2 2   Moving slowly or fidgety/restless 1 1   Suicidal thoughts 1 1   PHQ-9 Score 13 19   Difficult doing work/chores Somewhat difficult Somewhat difficult        11/09/2023   10:24 AM 09/26/2023   11:28 AM 05/26/2023    8:12 AM 04/28/2023    4:06 PM  GAD 7 : Generalized Anxiety Score  Nervous, Anxious, on Edge 0 0 0 0  Control/stop worrying 0 0 1 1  Worry too much - different things 1 1 1 1   Trouble relaxing 0 0 0 0  Restless 0 0 0 0  Easily annoyed or irritable 0 3 3 3   Afraid - awful might happen 0 1 0 3  Total GAD 7 Score 1 5 5 8   Anxiety Difficulty Not difficult at all Very difficult Somewhat difficult Somewhat difficult      Review of Systems  Constitutional:  Negative for chills and fever.  Respiratory:  Negative for shortness of breath.   Cardiovascular:  Negative for chest pain.  Gastrointestinal:  Positive for abdominal pain, diarrhea and nausea. Negative for blood in stool, constipation, heartburn and vomiting.  Neurological:  Negative for dizziness.  Psychiatric/Behavioral:  Negative for depression. The patient is not nervous/anxious.       Objective:     BP 138/80 (BP Location: Left Arm, Patient Position: Sitting, Cuff Size: Normal)   Pulse 90   Temp 98.1 F (36.7 C) (Oral)   Wt 187 lb (84.8 kg)   SpO2 95%   BMI 29.87 kg/m  BP Readings from Last 3 Encounters:  11/09/23 138/80  09/26/23 118/64  07/05/23 (!) 155/81   Wt Readings from Last 3 Encounters:  11/09/23 187 lb (84.8 kg)  09/26/23 184 lb 3.2 oz (83.6 kg)  08/22/23 183 lb (83 kg)      Physical Exam Vitals and nursing note reviewed.  Constitutional:      Appearance: Normal appearance.  Cardiovascular:     Rate and Rhythm: Normal rate and regular rhythm.     Pulses: Normal pulses.     Heart sounds: Normal heart sounds.  Pulmonary:     Effort: Pulmonary effort is normal.     Breath sounds: Normal breath sounds.  Neurological:      Mental Status: She is alert and oriented to person, place, and time.  Psychiatric:        Mood and Affect: Mood normal.        Behavior: Behavior  normal.        Thought Content: Thought content normal.        Judgment: Judgment normal.      No results found for any visits on 11/09/23.     The ASCVD Risk score (Arnett DK, et al., 2019) failed to calculate for the following reasons:   Risk score cannot be calculated because patient has a medical history suggesting prior/existing ASCVD    Assessment & Plan:  LLQ pain Assessment & Plan: Symptoms and exam suggestive of diverticulitis.   Tender in LLQ.   Cbc with diff and CMP pending.  Discussed to continue liquid diet.   Start Augmentin antibiotics. Take 1 tablet by mouth twice daily for 10 days.  Consider CT ABD/pelvis if not better.  She will update if symptoms worsen or do not improve.  ER precautions given.  Orders: -     Amoxicillin-Pot Clavulanate; Take 1 tablet by mouth 2 (two) times daily.  Dispense: 20 tablet; Refill: 0 -     CBC with Differential/Platelet -     Comprehensive metabolic panel  Diverticulitis of colon Assessment & Plan: Symptoms suggestive of diverticulitis.   Cbc with diff and CMP pending.  Discussed to continue liquid diet.  Start Augmentin antibiotics. Take 1 tablet by mouth twice daily for 10 days.   Orders: -     Amoxicillin-Pot Clavulanate; Take 1 tablet by mouth 2 (two) times daily.  Dispense: 20 tablet; Refill: 0 -     CBC with Differential/Platelet -     Comprehensive metabolic panel  Nausea Assessment & Plan: Rx sent for zofran for nausea.  Orders: -     Ondansetron; Take 1 tablet (4 mg total) by mouth every 8 (eight) hours as needed for nausea.  Dispense: 20 tablet; Refill: 0     Return if symptoms worsen or fail to improve.    [place]esto Charon, NP

## 2023-11-09 NOTE — Telephone Encounter (Signed)
Fyi for appt today.

## 2023-11-09 NOTE — Assessment & Plan Note (Signed)
 Symptoms suggestive of diverticulitis.   Cbc with diff and CMP pending.  Discussed to continue liquid diet.  Start Augmentin antibiotics. Take 1 tablet by mouth twice daily for 10 days.

## 2023-11-09 NOTE — Assessment & Plan Note (Signed)
 Rx sent for zofran for nausea.

## 2023-11-09 NOTE — Assessment & Plan Note (Signed)
 Symptoms and exam suggestive of diverticulitis.   Tender in LLQ.   Cbc with diff and CMP pending.  Discussed to continue liquid diet.   Start Augmentin antibiotics. Take 1 tablet by mouth twice daily for 10 days.  Consider CT ABD/pelvis if not better.  She will update if symptoms worsen or do not improve.  ER precautions given.

## 2023-11-10 DIAGNOSIS — G4733 Obstructive sleep apnea (adult) (pediatric): Secondary | ICD-10-CM | POA: Diagnosis not present

## 2023-11-13 DIAGNOSIS — K7581 Nonalcoholic steatohepatitis (NASH): Secondary | ICD-10-CM | POA: Diagnosis not present

## 2023-11-13 DIAGNOSIS — K838 Other specified diseases of biliary tract: Secondary | ICD-10-CM | POA: Diagnosis not present

## 2023-11-13 DIAGNOSIS — K7460 Unspecified cirrhosis of liver: Secondary | ICD-10-CM | POA: Diagnosis not present

## 2023-11-19 ENCOUNTER — Telehealth: Payer: Self-pay | Admitting: Family Medicine

## 2023-11-19 NOTE — Telephone Encounter (Signed)
 Please update patient- they will get a letter/mychart message about this.    The hospital system discovered a software error in a system at the laboratory at [facility] at [address] in [place] that examines kidney function. The patient's results were incorrectly calculated on this test.  The test is called uACR. It measures the amount of albumin (a protein) in the urine relative to creatinine (a waste product). This test is one of several factors used to judge kidney function. The laboratory software miscalculated the ratio of one to the other. The measurements themselves were correct. The software error has been fixed.  I went back and checked the patient's lab results and medicines.    At this point, it does not appear that the patient needs to make any medicine changes.  We will still monitor her labs as planned.    If the patient has any questions or concerns about this, please schedule a visit/let me know.   Thanks.

## 2023-11-20 NOTE — Telephone Encounter (Signed)
 Patient picked up medication

## 2023-11-21 DIAGNOSIS — R3989 Other symptoms and signs involving the genitourinary system: Secondary | ICD-10-CM | POA: Diagnosis not present

## 2023-11-21 DIAGNOSIS — N398 Other specified disorders of urinary system: Secondary | ICD-10-CM | POA: Diagnosis not present

## 2023-11-21 DIAGNOSIS — N952 Postmenopausal atrophic vaginitis: Secondary | ICD-10-CM | POA: Diagnosis not present

## 2023-11-21 DIAGNOSIS — N941 Unspecified dyspareunia: Secondary | ICD-10-CM | POA: Diagnosis not present

## 2023-12-10 DIAGNOSIS — G4733 Obstructive sleep apnea (adult) (pediatric): Secondary | ICD-10-CM | POA: Diagnosis not present

## 2023-12-15 DIAGNOSIS — H26491 Other secondary cataract, right eye: Secondary | ICD-10-CM | POA: Diagnosis not present

## 2023-12-20 DIAGNOSIS — K7581 Nonalcoholic steatohepatitis (NASH): Secondary | ICD-10-CM | POA: Diagnosis not present

## 2023-12-20 DIAGNOSIS — K838 Other specified diseases of biliary tract: Secondary | ICD-10-CM | POA: Diagnosis not present

## 2023-12-20 DIAGNOSIS — K7460 Unspecified cirrhosis of liver: Secondary | ICD-10-CM | POA: Diagnosis not present

## 2023-12-25 ENCOUNTER — Telehealth: Payer: Self-pay | Admitting: Pharmacy Technician

## 2023-12-25 NOTE — Telephone Encounter (Signed)
 Pharmacy Patient Advocate Encounter  Received notification from [facility] that Prior Authorization for repatha has been APPROVED from 12/25/23 to 12/24/24   PA #/Case ID/Reference #: [id number]

## 2023-12-25 NOTE — Telephone Encounter (Signed)
 Pharmacy Patient Advocate Encounter   Received notification from CoverMyMeds that prior authorization for repatha is required/requested.   Insurance verification completed.   The patient is insured through [facility] .   Per test claim: PA required; PA submitted to above mentioned insurance via CoverMyMeds Key/confirmation #/[employer] Status is pending

## 2024-01-02 ENCOUNTER — Encounter: Payer: Self-pay | Admitting: Family Medicine

## 2024-01-02 ENCOUNTER — Ambulatory Visit (INDEPENDENT_AMBULATORY_CARE_PROVIDER_SITE_OTHER): Payer: Medicare Other | Admitting: Family Medicine

## 2024-01-02 VITALS — BP 132/68 | HR 64 | Temp 98.2°F | Ht 66.34 in | Wt 184.0 lb

## 2024-01-02 DIAGNOSIS — R0602 Shortness of breath: Secondary | ICD-10-CM

## 2024-01-02 DIAGNOSIS — Z659 Problem related to unspecified psychosocial circumstances: Secondary | ICD-10-CM

## 2024-01-02 DIAGNOSIS — E119 Type 2 diabetes mellitus without complications: Secondary | ICD-10-CM

## 2024-01-02 DIAGNOSIS — Z7984 Long term (current) use of oral hypoglycemic drugs: Secondary | ICD-10-CM

## 2024-01-02 DIAGNOSIS — Z794 Long term (current) use of insulin: Secondary | ICD-10-CM

## 2024-01-02 LAB — POCT GLYCOSYLATED HEMOGLOBIN (HGB A1C): Hemoglobin A1C: 8.5 % — AB (ref 4.0–5.6)

## 2024-01-02 LAB — BRAIN NATRIURETIC PEPTIDE: Pro B Natriuretic peptide (BNP): 61 pg/mL (ref 0.0–100.0)

## 2024-01-02 MED ORDER — LANTUS SOLOSTAR 100 UNIT/ML ~~LOC~~ SOPN: 54.0000 [IU] | PEN_INJECTOR | Freq: Every day | SUBCUTANEOUS | Status: DC

## 2024-01-02 MED ORDER — CITALOPRAM HYDROBROMIDE 20 MG PO TABS: 30.0000 mg | ORAL_TABLET | Freq: Every day | ORAL | Status: DC

## 2024-01-02 NOTE — Progress Notes (Unsigned)
 Diabetes:  Using medications without difficulties:yes Hypoglycemic episodes: no Hyperglycemic episodes: no Feet problems: no Blood Sugars averaging: 140-300s   eye exam within last year: A1c 8.5, d/w pt at OV.   Taking 54 units insulin .   Metformin  500mg  BID.   Prev MALB noted and d/w pt.  H/o UTI noted.  Already on ACE.   Her 30 day sugar averages are higher than her 90 day averages.    Her son is working through rehab, working.  She recently started taking 40mg  celexa .  Rare BZD use.  Stress eating d/w pt. She had tried counseling in the past, d/w pt.   Pantoprazole  helped with nausea.    She has some episodic SOB, unclear if seasonal allergy related.  No recent SABA use.  D/w pt about checking BNP.  Sneezing noted.  No CP.  No BLE edema.    Meds, vitals, and allergies reviewed.   ROS: Per HPI unless specifically indicated in ROS section   GEN: nad, alert and oriented HEENT: ncat NECK: supple w/o LA CV: rrr. PULM: ctab, no inc wob ABD: soft, +bs EXT: no edema SKIN: no acute rash  Diabetic foot exam: Normal inspection except for L 3rd toe cyst at baseline.  No skin breakdown No calluses  Normal DP pulses Normal sensation to light touch and monofilament Nails normal

## 2024-01-02 NOTE — Patient Instructions (Addendum)
 Let me know if citalopram  40mg  isn't helping.  Recheck in about 3 months, A1c at the visit.  Take care.  Glad to see you. If your AM sugars are above 160, then add 1 unit that night.   If below 110, then cut back 1 unit.  If 110-160, no change.   Go to the lab on the way out.   If you have mychart we'll likely use that to update you.

## 2024-01-03 ENCOUNTER — Encounter: Payer: Self-pay | Admitting: Family Medicine

## 2024-01-03 NOTE — Assessment & Plan Note (Signed)
 She is going to let me know if citalopram  40 mg is not helpful.  It may help some with stress eating.  Discussed.  Okay for outpatient follow-up.

## 2024-01-03 NOTE — Assessment & Plan Note (Signed)
 Unclear if this is seasonally/allergy related.  See notes on labs.  Lungs are clear.  Okay for outpatient follow-up.

## 2024-01-03 NOTE — Assessment & Plan Note (Signed)
 A1c 8.5, d/w pt at OV.   Taking 54 units insulin .   Metformin  500mg  BID.   Prev MALB noted and d/w pt.  H/o UTI noted.  Already on ACE.   Her 30 day sugar averages are higher than her 90 day averages.   Recheck in about 3 months, A1c at the visit.  Gradually adjust insulin .  If AM sugars are above 160, then add 1 unit that night.   If below 110, then cut back 1 unit.  If 110-160, no change.

## 2024-01-18 DIAGNOSIS — E1165 Type 2 diabetes mellitus with hyperglycemia: Secondary | ICD-10-CM | POA: Diagnosis not present

## 2024-01-18 DIAGNOSIS — Z794 Long term (current) use of insulin: Secondary | ICD-10-CM | POA: Diagnosis not present

## 2024-01-24 ENCOUNTER — Telehealth: Payer: Self-pay

## 2024-01-24 NOTE — Telephone Encounter (Signed)
 Spoke with patient to advise that she has 3 boxes of the [employer] available for pickup

## 2024-02-09 ENCOUNTER — Telehealth: Payer: Self-pay | Admitting: Family Medicine

## 2024-02-09 DIAGNOSIS — K5732 Diverticulitis of large intestine without perforation or abscess without bleeding: Secondary | ICD-10-CM | POA: Diagnosis not present

## 2024-02-09 NOTE — Telephone Encounter (Signed)
Please triage patient.

## 2024-02-09 NOTE — Telephone Encounter (Signed)
 I spoke with pt; pt spoke with Gastroenterology and they had cancellation this afternoon at 3pm. Pt was evaluated by GI and given augmentin . GI discussed pt having surgery to have part of colon removed to get rid of the problem area in pts colon.so pt has been taken care of today and appreciates the call back but pt said she would at some time appreciate a call from Dr Vallarie Gauze and get his opinion about possible surgery to remove some of the colon. Sending note to Dr Vallarie Gauze.

## 2024-02-09 NOTE — Telephone Encounter (Signed)
 Copied from CRM [id number]. Topic: Clinical - Medication Question >> Feb 09, 2024  8:47 AM Allison Yoder wrote: Reason for CRM: pt called to speak with provider regarding medication for diverticulitis. Pt is requesting medication is called in for diverticulitits. Pleas call pt back at [phone]

## 2024-02-11 NOTE — Telephone Encounter (Signed)
 Please request the GI notes. I cannot offer an opinion without seeing the GI notes. She is also likely going to need improvement in her A1c before she has elective surgery.  I think it makes sense to get her next A1c done (when she is due) and talk about all of this at the same time.  Thanks.

## 2024-02-12 NOTE — Telephone Encounter (Signed)
 Spoke with patient on the phone and scheduled her for A1C check on 7/28. She also advised that she has scheduled to see the surgeron just for a consultation on 6/25 but she may not even think about surgery until late August.

## 2024-02-12 NOTE — Telephone Encounter (Signed)
 Noted. Thanks.

## 2024-02-21 ENCOUNTER — Other Ambulatory Visit: Payer: Self-pay | Admitting: Family Medicine

## 2024-04-08 ENCOUNTER — Ambulatory Visit (INDEPENDENT_AMBULATORY_CARE_PROVIDER_SITE_OTHER): Admitting: Family Medicine

## 2024-04-08 ENCOUNTER — Encounter: Payer: Self-pay | Admitting: Family Medicine

## 2024-04-08 VITALS — BP 138/82 | HR 71 | Temp 99.2°F | Ht 66.34 in | Wt 187.4 lb

## 2024-04-08 DIAGNOSIS — F419 Anxiety disorder, unspecified: Secondary | ICD-10-CM

## 2024-04-08 DIAGNOSIS — F32A Depression, unspecified: Secondary | ICD-10-CM

## 2024-04-08 DIAGNOSIS — E119 Type 2 diabetes mellitus without complications: Secondary | ICD-10-CM

## 2024-04-08 DIAGNOSIS — Z7984 Long term (current) use of oral hypoglycemic drugs: Secondary | ICD-10-CM

## 2024-04-08 DIAGNOSIS — K5732 Diverticulitis of large intestine without perforation or abscess without bleeding: Secondary | ICD-10-CM | POA: Diagnosis not present

## 2024-04-08 LAB — POCT GLYCOSYLATED HEMOGLOBIN (HGB A1C): Hemoglobin A1C: 7.5 % — AB (ref 4.0–5.6)

## 2024-04-08 LAB — MICROALBUMIN / CREATININE URINE RATIO
Creatinine,U: 97.1 mg/dL
Microalb Creat Ratio: 32 mg/g — ABNORMAL HIGH (ref 0.0–30.0)
Microalb, Ur: 3.1 mg/dL — ABNORMAL HIGH (ref 0.0–1.9)

## 2024-04-08 MED ORDER — METFORMIN HCL ER 500 MG PO TB24: 500.0000 mg | ORAL_TABLET | Freq: Every day | ORAL | Status: DC

## 2024-04-08 MED ORDER — CITALOPRAM HYDROBROMIDE 20 MG PO TABS: 30.0000 mg | ORAL_TABLET | Freq: Every day | ORAL | Status: DC

## 2024-04-08 MED ORDER — LANTUS SOLOSTAR 100 UNIT/ML ~~LOC~~ SOPN: 60.0000 [IU] | PEN_INJECTOR | Freq: Every day | SUBCUTANEOUS | Status: DC

## 2024-04-08 NOTE — Progress Notes (Unsigned)
 Diabetes:  Using medications without difficulties:  d/w pt about potential metformin  GI side effect vs stress induced GI sx vs both.   Hypoglycemic episodes: rare, cautions d/w pt.  70s.  If prolonged fasting.   Hyperglycemic episodes: no Feet problems: some tingling.   Blood Sugars averaging: variable with recent schedule changes.   eye exam within last year: yes A1c d/w pt a OV.   D/w pt about potentially seeing surgery re: diverticulitis hx.  She wanted to defer surgery eval at this point.    Mood d/w pt.  Sig social upheaval with caring for her grandson and her brother.  D/w pt.  Chronic stressors d/w pt re: her son.  Patient is safe at home.  Financial strain d/w pt.  She is checking on financial help at school for her grandson.  No SI/HI.  Still taking celexa  30mg .  She was fatigued with higher dose of celexa .  She agreed to update me as needed.    Meds, vitals, and allergies reviewed.   ROS: Per HPI unless specifically indicated in ROS section   GEN: nad, alert and oriented HEENT: ncat NECK: supple w/o LA CV: rrr. PULM: ctab, no inc wob ABD: soft, +bs EXT: no edema SKIN: no acute rash  Diabetic foot exam: Normal inspection except for cyst L 3rd toe w/o erythema.   No skin breakdown Minimal small calluses on the feet.   Normal DP pulses Normal sensation to light touch and monofilament Nails normal  30 minutes were devoted to patient care in this encounter (this includes time spent reviewing the patient's file/history, interviewing and examining the patient, counseling/reviewing plan with patient).

## 2024-04-08 NOTE — Patient Instructions (Addendum)
 Go to the lab on the way out.   If you have mychart we'll likely use that to update you.    Take care.  Glad to see you. I would cut metformin  back to 1 tab a day and see if that helps with GI symptoms.  Recheck in about 4 months with A1c at an office visit.  You don't need to fast.

## 2024-04-10 NOTE — Assessment & Plan Note (Signed)
 She is checking on financial help at school for her grandson.  No SI/HI.  Still taking celexa  30mg .  She was fatigued with higher dose of celexa .  She agreed to update me as needed.

## 2024-04-10 NOTE — Assessment & Plan Note (Signed)
 H/o. D/w pt about potentially seeing surgery re: diverticulitis hx.  She wanted to defer surgery eval at this point.  She can update me as needed.

## 2024-04-10 NOTE — Assessment & Plan Note (Signed)
 See notes on labs. I would cut metformin  back to 1 tab a day and see if that helps with GI symptoms.  Recheck in about 4 months with A1c at an office visit.

## 2024-04-11 ENCOUNTER — Ambulatory Visit: Payer: Self-pay | Admitting: Family Medicine

## 2024-04-17 ENCOUNTER — Telehealth: Payer: Self-pay

## 2024-04-17 DIAGNOSIS — E1165 Type 2 diabetes mellitus with hyperglycemia: Secondary | ICD-10-CM | POA: Diagnosis not present

## 2024-04-17 DIAGNOSIS — G4733 Obstructive sleep apnea (adult) (pediatric): Secondary | ICD-10-CM | POA: Diagnosis not present

## 2024-04-17 DIAGNOSIS — Z794 Long term (current) use of insulin: Secondary | ICD-10-CM | POA: Diagnosis not present

## 2024-04-17 NOTE — Telephone Encounter (Signed)
 Spoke with patient to advise that she has 3 boxes of Lantus  available for pickup at office

## 2024-05-03 ENCOUNTER — Telehealth: Payer: Self-pay | Admitting: Family Medicine

## 2024-05-03 NOTE — Telephone Encounter (Signed)
 She would like it called in states having a flare up of Diverticulitis  Prescription Request  05/03/2024  LOV: 04/08/2024  What is the name of the medication or equipment? Ougmenton amoxaciliin  Have you contacted your pharmacy to request a refill? No   Which pharmacy would you like this sent to?  Which pharmacy would you like this sent to?  [id number] - [place], [place] - [address] AT [facility] OF [address] [place] [address] Phone: [phone] Fax: [phone]   Patient notified that their request is being sent to the clinical staff for review and that they should receive a response within 2 business days.   Please advise at Mobile [phone] (mobile)

## 2024-05-03 NOTE — Telephone Encounter (Signed)
Patient picked up medication today.

## 2024-05-06 ENCOUNTER — Telehealth: Payer: Self-pay

## 2024-05-06 DIAGNOSIS — R1032 Left lower quadrant pain: Secondary | ICD-10-CM

## 2024-05-06 DIAGNOSIS — K5732 Diverticulitis of large intestine without perforation or abscess without bleeding: Secondary | ICD-10-CM

## 2024-05-06 MED ORDER — AMOXICILLIN-POT CLAVULANATE 875-125 MG PO TABS: 1.0000 | ORAL_TABLET | Freq: Two times a day (BID) | ORAL | 0 refills | Status: DC

## 2024-05-06 NOTE — Addendum Note (Signed)
 Addended by: CLEATUS ARLYSS RAMAN on: 05/06/2024 05:28 PM   Modules accepted: Orders

## 2024-05-06 NOTE — Telephone Encounter (Signed)
 Copied from CRM #[id number]. Topic: Clinical - Prescription Issue >> May 03, 2024  4:37 PM Roselie BROCKS wrote: Reason for CRM: Patient called back to check on refill of  Ougmenton amoxaciliin let patient know I'm not showing the pharmacy has accepted it yet.

## 2024-05-06 NOTE — Telephone Encounter (Signed)
 What is her status currently?  Does she have symptoms now?  Please let me know.  Thanks.

## 2024-05-06 NOTE — Telephone Encounter (Signed)
 Sent but rec: OV for recheck near future.  Clear liquid diet in the meantime, please give ER cautions.

## 2024-05-06 NOTE — Telephone Encounter (Signed)
 Spoke with patient states that she is having soreness and tenderness on her left side. Symptoms began on Thursday night

## 2024-05-06 NOTE — Telephone Encounter (Signed)
 Patient is requesting a refill for amoxicillin -clavulanate (AUGMENTIN ) 875-125 MG tablet . From what I can gather its a possible diverticulitis flare up. Please advise

## 2024-05-06 NOTE — Telephone Encounter (Signed)
 Called and spoke with pt, let her know that antibiotic has been sent in. She will call back if symptoms are not improving after starting medication.

## 2024-05-18 DIAGNOSIS — G4733 Obstructive sleep apnea (adult) (pediatric): Secondary | ICD-10-CM | POA: Diagnosis not present

## 2024-05-22 DIAGNOSIS — M19011 Primary osteoarthritis, right shoulder: Secondary | ICD-10-CM | POA: Diagnosis not present

## 2024-05-24 ENCOUNTER — Ambulatory Visit (INDEPENDENT_AMBULATORY_CARE_PROVIDER_SITE_OTHER)
Admission: RE | Admit: 2024-05-24 | Discharge: 2024-05-24 | Disposition: A | Discharge status: Home / Self Care | Source: Ambulatory Visit

## 2024-05-24 ENCOUNTER — Ambulatory Visit

## 2024-05-24 ENCOUNTER — Ambulatory Visit: Payer: Self-pay

## 2024-05-24 ENCOUNTER — Encounter: Payer: Self-pay | Admitting: Pharmacist

## 2024-05-24 ENCOUNTER — Telehealth: Payer: Self-pay | Admitting: Family Medicine

## 2024-05-24 VITALS — BP 132/80 | HR 69 | Temp 98.0°F | Ht 66.5 in | Wt 187.0 lb

## 2024-05-24 DIAGNOSIS — R051 Acute cough: Secondary | ICD-10-CM | POA: Diagnosis not present

## 2024-05-24 DIAGNOSIS — H903 Sensorineural hearing loss, bilateral: Secondary | ICD-10-CM | POA: Diagnosis not present

## 2024-05-24 LAB — POC COVID19 BINAXNOW: SARS Coronavirus 2 Ag: NEGATIVE

## 2024-05-24 LAB — POCT INFLUENZA A/B
Influenza A, POC: NEGATIVE
Influenza B, POC: NEGATIVE

## 2024-05-24 MED ORDER — DOXYCYCLINE HYCLATE 100 MG PO TABS: 100.0000 mg | ORAL_TABLET | Freq: Two times a day (BID) | ORAL | 0 refills | Status: AC

## 2024-05-24 MED ORDER — AMOXICILLIN-POT CLAVULANATE 875-125 MG PO TABS: 1.0000 | ORAL_TABLET | Freq: Two times a day (BID) | ORAL | 0 refills | Status: AC

## 2024-05-24 NOTE — Telephone Encounter (Signed)
 FYI Only or Action Required?: FYI only for provider.  Patient was last seen in primary care on 04/08/2024 by Cleatus Arlyss RAMAN, MD.  Called Nurse Triage reporting Cough.  Symptoms began several weeks ago.  Interventions attempted: OTC medications: Musinex and Rest, hydration, or home remedies.  Symptoms are: unchanged.  Triage Disposition: See Physician Within 24 Hours  Patient/caregiver understands and will follow disposition?: Yes Reason for Disposition  [1] Continuous (nonstop) coughing interferes with work or school AND [2] no improvement using cough treatment per Care Advice  Answer Assessment - Initial Assessment Questions Taking Musinex almost 3 weeks. Felt like was getting better, but came back worse. Covid test negative. Experiencing some lung pain. Patient stated she had a bad case of pneumonia that went septic a few years ago and feels like what she is experiencing is similar.   1. ONSET: When did the cough begin?      Almost a month ago      3. SPUTUM: Describe the color of your sputum (e.g., none, dry cough; clear, white, yellow, green)     Non productive cough unless taking a lot of Musinex and drinking water, yellow-ish greenish mucous  4. DIFFICULTY BREATHING: Are you having difficulty breathing? If Yes, ask: How bad is it? (e.g., mild, moderate, severe)      Little SOB with exertion  6. FEVER: Do you have a fever? If Yes, ask: What is your temperature, how was it measured, and when did it start?     Temp yesterday 100.1  7. LUNG HISTORY: Do you have any history of lung disease?  (e.g., pulmonary embolus, asthma, emphysema)     No, has inhaler due to bad case of pneumonia that went septic  8. OTHER SYMPTOMS: Do you have any other symptoms? (e.g., runny nose, wheezing, chest pain)       Rattly sound in chest occasionally  Protocols used: Cough - Acute Productive-A-AH  Copied from CRM #[id number]. Topic: Clinical - Red Word Triage >> May 24, 2024  10:32 AM Chasity T wrote: [facility] that prompted transfer to Nurse Triage: Pt is calling because she has been having a cold for 3 weeks now and nothing is getting better.   Symptoms:  - yellowish/green mucous - sever cough -took covid test (negative) - low grade fever (101) - little bit of chest pain/shortness of breath

## 2024-05-24 NOTE — Progress Notes (Signed)
 Chart Review Reason: Drug information Question - CAP treatment consideration   Summary: Ideally, Augmentin  and Doxy but she had Augmentin  about a month ago for diverticulitis.  She does have cephalexin allergy so I did not think I could use any of the other cephalosporins.  I am also hesitant to use Levaquin  because she is over 60 and does have diabetes, might be at increased risk for adverse effects  Assessment Treatment of CAP in patient with several comorbidities (fatty liver, diabetes, hypertension). Also appears she has had hx sepsis 2/2 pneumonia.    IDSA:  Combination therapy with amoxicillin /clavulanate or cephalosporin Augmentin  use ~1 month ago Sporin allergy prior - avoid  AND macrolide or doxycyclinex OR monotherapy with respiratory fluoroquinolone levofloxacin  750 mg daily moxifloxacin  400 mg daily gemifloxacin 320 mg daily  Renal function good, eGFR >60 FLD with LFTs WNL.   Considerations: Recent antibiotic exposure in recent months may be a risk factor for resistant strains of S.pneumo. With this, dual/broader-therapy is recommended consistent with recommendation for patients with chronic conditions.   Augmentin  if used with doxy or azithro, it remains a reasonable option for this patient as the doxy/azithro would cover s.pneumo strains.   Fluoroquinolone monotherapy would also have a favorable spectrum of action, though agree risk/benefit of adverse effects to be weight against risk/benefit with the above regimen. Renal function is great, quinolone is not unreasonable though carries risks in older adults.   Allison Yoder, PharmD Clinical Pharmacist [facility] [phone]

## 2024-05-24 NOTE — Progress Notes (Signed)
 Subjective:    Patient ID: Allison Yoder, female    DOB: 1951/01/08, 73 y.o.   MRN: [id number]   Allison Yoder is a very pleasant 73 y.o. female who presents today for cough. Sx started 3wks ago started w scratchy throat, nose running, improved briefly was able to do stuff around the house for a few days then developed cough not productive, took guafenisin, seems better slightly Son was sick with URI sx before, no hx asthma copd, got sepsis from pneumonia in 2017, since then has albuteroil on hand but never needs it Endorses SOB on exertion, yesterday pain when breathing in deeply, having sweats  Review of Systems  All other systems reviewed and are negative.       Allergies  Allergen Reactions   Cephalexin Itching    Does tolerate augmentin    Codeine Itching and Rash   Inapsine [Droperidol] Shortness Of Breath, Anxiety and Hypertension    Elevated HR and BP, panic attack   Crestor  Aurora.Binet ]     Aches   Lipitor [Atorvastatin  Calcium ]     Myalgias    Metformin  And Related     Does not tolerate high-dose metformin  due to GI symptoms.   Niacin And Related     Flushing   Pravastatin     Myalgias   Adhesive [Tape] Rash    Blisters with tape   Latex Rash    When gloves are personally worn   Wound Dressing Adhesive Rash    Current Outpatient Medications on File Prior to Visit  Medication Sig Dispense Refill   albuterol  (VENTOLIN  HFA) 108 (90 Base) MCG/ACT inhaler INHALE 1 TO 2 PUFFS INTO THE LUNGS EVERY 6 HOURS AS NEEDED FOR WHEEZING OR SHORTNESS OF BREATH 25.5 g 1   amoxicillin -clavulanate (AUGMENTIN ) 875-125 MG tablet Take 1 tablet by mouth 2 (two) times daily. 20 tablet 0   betamethasone valerate ointment (VALISONE) 0.1 % Apply 1 Application topically daily.     Blood Glucose Monitoring Suppl (ONE TOUCH ULTRA 2) w/Device KIT Check blood sugar once daily and as instructed. Dx E11.9 1 each 0   citalopram  (CELEXA ) 20 MG tablet Take 1.5 tablets (30 mg total) by mouth  daily.     clonazePAM  (KLONOPIN ) 0.5 MG tablet Take 1 tablet (0.5 mg total) by mouth daily as needed for anxiety. 30 tablet 1   cyclobenzaprine  (FLEXERIL ) 10 MG tablet Take 1 tablet (10 mg total) by mouth daily as needed for muscle spasms. 90 tablet 3   glucose blood (ONETOUCH ULTRA) test strip as directed to check blood sugar daily. Dx E11.9 100 strip 2   insulin  glargine (LANTUS  SOLOSTAR) 100 UNIT/ML Solostar Pen Inject 60 Units into the skin daily.     Insulin  Pen Needle (BD PEN NEEDLE NANO 2ND GEN) 32G X 4 MM MISC USE WITH INSULIN  PEN EVERY DAY AS DIRECTED. 100 each 3   Lancets (ONETOUCH DELICA PLUS LANCET33G) MISC USE ONCE DAILY AS DIRECTED 100 each 3   lisinopril  (ZESTRIL ) 40 MG tablet Take 1 tablet (40 mg total) by mouth daily. 90 tablet 3   metFORMIN  (GLUCOPHAGE -XR) 500 MG 24 hr tablet Take 1 tablet (500 mg total) by mouth daily with breakfast.     metoprolol  succinate (TOPROL -XL) 50 MG 24 hr tablet TAKE 1 TABLET BY MOUTH  DAILY WITH OR IMMEDIATELY  FOLLOWING A MEAL 90 tablet 3   Multiple Vitamin (MULTIVITAMIN) tablet Take 1 tablet by mouth daily.     ondansetron  (ZOFRAN -ODT) 4 MG disintegrating tablet Take  1 tablet (4 mg total) by mouth every 8 (eight) hours as needed for nausea. 20 tablet 0   pantoprazole  (PROTONIX ) 40 MG tablet Take 1 tablet (40 mg total) by mouth daily. 90 tablet 3   REPATHA  SURECLICK 140 MG/ML SOAJ ADMINISTER 1 ML UNDER THE SKIN EVERY 14 DAYS 2 mL 11   Vitamin D , Cholecalciferol, 1000 units CAPS Take 3,000 Units by mouth daily.     No current facility-administered medications on file prior to visit.    BP 132/80 (BP Location: Left Arm, Patient Position: Sitting, Cuff Size: Large)   Pulse 69   Temp 98 F (36.7 C) (Temporal)   Ht 5' 6.5 (1.689 m)   Wt 187 lb (84.8 kg)   SpO2 96%   BMI 29.73 kg/m   Objective:    Physical Exam Vitals and nursing note reviewed.  Constitutional:      Appearance: Normal appearance.  HENT:     Head: Normocephalic and  atraumatic.     Comments: Mild tenderness over the left frontal and maxillary sinuses Eyes:     Extraocular Movements: Extraocular movements intact.     Conjunctiva/sclera: Conjunctivae normal.  Pulmonary:     Effort: Pulmonary effort is normal.     Comments: Course lung sounds in the left middle and lower lobes Skin:    General: Skin is warm.  Neurological:     Mental Status: She is alert.  Psychiatric:        Mood and Affect: Mood normal.        Behavior: Behavior normal.            Assessment & Plan:   1. Acute cough (Primary) Patient presents with several weeks of malaise which initially started with URI symptoms, but has now developed into a cough and low-grade fever.  COVID and flu test in the office are negative.  I have concern for possible pneumonia.  While this could be viral given recent URI symptoms, there is also possibility for bacterial superinfection following weakened respiratory defenses after recent URI.  CXR in the office does demonstrate left lower lobe opacity on my wet read.  Will cover with a 5-day course of antibiotics. She was recently sent Augmentin  last month for diverticulitis per chart review, but confirms she never took this.  Will treat with 5-day course of Augmentin  and doxycycline .  Counseled extensively on potential side effects including but not limited to diarrhea and GI discomfort, patient opts to proceed.  Patient also counseled on warning symptoms that would warrant prompt ED evaluation.  - POC COVID-19 - DG Chest 2 View - Influenza A/B - amoxicillin -clavulanate (AUGMENTIN ) 875-125 MG tablet; Take 1 tablet by mouth 2 (two) times daily for 5 days.  Dispense: 10 tablet; Refill: 0 - doxycycline  (VIBRA -TABS) 100 MG tablet; Take 1 tablet (100 mg total) by mouth 2 (two) times daily for 5 days.  Dispense: 10 tablet; Refill: 0    Return if symptoms worsen or fail to improve.   [name], MD  05/24/24

## 2024-05-24 NOTE — Telephone Encounter (Signed)
 Error

## 2024-05-28 ENCOUNTER — Telehealth: Payer: Self-pay

## 2024-05-28 MED ORDER — PREDNISONE 10 MG PO TABS
10.0000 mg | ORAL_TABLET | Freq: Every day | ORAL | 0 refills | Status: DC
Start: 2024-05-28 — End: 2024-06-07

## 2024-05-28 NOTE — Telephone Encounter (Signed)
 Pt advised.

## 2024-05-28 NOTE — Telephone Encounter (Signed)
 Please advise Pt was last seen on 05/24/24 by Dr Bennett for a cough.   Copied from CRM [id number]. Topic: Clinical - Medical Advice >> May 28, 2024 11:44 AM Allison Yoder wrote: Reason for CRM: Patient stated she is getting over a bad cold, cough is getting better but still some congestion , would like to be sent some prednisone  [id number] - [place], [place] - [address] AT [facility] OF [address] [place] [id number] Phone: [phone] Fax: [phone]

## 2024-05-28 NOTE — Telephone Encounter (Signed)
 Sent, please caution patient about sugar elevation.  Would use a low dose, ie 10mg , given her hx.  Thanks.

## 2024-06-07 ENCOUNTER — Ambulatory Visit: Admitting: Family Medicine

## 2024-06-07 ENCOUNTER — Encounter: Payer: Self-pay | Admitting: Family Medicine

## 2024-06-07 VITALS — BP 136/82 | HR 80 | Temp 98.7°F | Ht 66.5 in | Wt 188.6 lb

## 2024-06-07 DIAGNOSIS — F419 Anxiety disorder, unspecified: Secondary | ICD-10-CM | POA: Diagnosis not present

## 2024-06-07 DIAGNOSIS — R059 Cough, unspecified: Secondary | ICD-10-CM | POA: Diagnosis not present

## 2024-06-07 DIAGNOSIS — R109 Unspecified abdominal pain: Secondary | ICD-10-CM | POA: Diagnosis not present

## 2024-06-07 DIAGNOSIS — F32A Depression, unspecified: Secondary | ICD-10-CM

## 2024-06-07 MED ORDER — METFORMIN HCL ER 500 MG PO TB24: 500.0000 mg | ORAL_TABLET | Freq: Two times a day (BID) | ORAL | Status: AC

## 2024-06-07 MED ORDER — LANTUS SOLOSTAR 100 UNIT/ML ~~LOC~~ SOPN: 62.0000 [IU] | PEN_INJECTOR | Freq: Every day | SUBCUTANEOUS | Status: DC

## 2024-06-07 NOTE — Progress Notes (Signed)
 Prev CXR d/w pt.  She started to feel better a few days ago then regressed again.  Still with some cough.  Still having sweats.  Done with prednisone  and augmentin .  Still fatigued.  Less sputum overall, still with some clear sputum.  No fevers.  No vomiting.  Some nasal drainage but not much congestion.  No ear pain.  No wheeze.    Sugar was higher on prednisone , better in the meantime.  Sugar was 83 this AM.  She is tolerating metformin  500mg  BID.   D/w pt about prev abd discomfort and concern for gall bladder findings. Prev CT: gallbladder is mildly distended. No visible stones.  Mother and 3 sibs had similar sx and improved with cholecystectomy.  She has had recurrent abd pain and nausea.  D/w pt about about seeing general surgery.  Referral placed.  D/w pt about getting set up with counseling in [place].  Per HPI unless specifically indicated in ROS section  Meds, vitals, and allergies reviewed.   GEN: nad, alert and oriented HEENT: mucous membranes moist, OP with cobblestoning NECK: supple w/o LA CV: rrr. PULM: ctab, no inc wob ABD: soft, +bs, not ttp.   EXT: no edema  30 minutes were devoted to patient care in this encounter (this includes time spent reviewing the patient's file/history, interviewing and examining the patient, counseling/reviewing plan with patient).

## 2024-06-07 NOTE — Patient Instructions (Addendum)
 Let me know if you can't get set up with general surgery.  Take care.  Glad to see you. I would use albuterol  in the meantime.  Let me know if you are not improving.

## 2024-06-09 NOTE — Assessment & Plan Note (Signed)
 Continue Celexa  and.  Klonopin .  Refer for counseling.

## 2024-06-09 NOTE — Assessment & Plan Note (Signed)
 Refer to general surgery for evaluation, for possible cholecystectomy.

## 2024-06-09 NOTE — Assessment & Plan Note (Signed)
 Lungs are still clear.  Will try using albuterol  and update me if not improving.  She agrees to plan.

## 2024-06-21 NOTE — Progress Notes (Signed)
 Allison Yoder                                          MRN: [id number]   06/21/2024   The [facility]BCI Quality Team Specialist reviewed this patient medical record for the purposes of chart review for care gap closure. The following were reviewed: abstraction for care gap closure-kidney health evaluation for diabetes:eGFR  and uACR.    [facility]BCI Quality Team

## 2024-06-26 DIAGNOSIS — Z6830 Body mass index (BMI) 30.0-30.9, adult: Secondary | ICD-10-CM | POA: Diagnosis not present

## 2024-06-26 DIAGNOSIS — Z01419 Encounter for gynecological examination (general) (routine) without abnormal findings: Secondary | ICD-10-CM | POA: Diagnosis not present

## 2024-07-15 ENCOUNTER — Encounter: Payer: Self-pay | Admitting: Radiology

## 2024-07-22 DIAGNOSIS — Z87891 Personal history of nicotine dependence: Secondary | ICD-10-CM | POA: Diagnosis not present

## 2024-07-22 DIAGNOSIS — F3342 Major depressive disorder, recurrent, in full remission: Secondary | ICD-10-CM | POA: Diagnosis not present

## 2024-07-25 ENCOUNTER — Ambulatory Visit: Admitting: Family Medicine

## 2024-07-25 ENCOUNTER — Encounter: Payer: Self-pay | Admitting: Family Medicine

## 2024-07-25 VITALS — BP 122/78 | HR 74 | Temp 98.4°F | Ht 66.5 in | Wt 190.0 lb

## 2024-07-25 DIAGNOSIS — F419 Anxiety disorder, unspecified: Secondary | ICD-10-CM | POA: Diagnosis not present

## 2024-07-25 DIAGNOSIS — R109 Unspecified abdominal pain: Secondary | ICD-10-CM

## 2024-07-25 DIAGNOSIS — F32A Depression, unspecified: Secondary | ICD-10-CM

## 2024-07-25 DIAGNOSIS — J3489 Other specified disorders of nose and nasal sinuses: Secondary | ICD-10-CM | POA: Diagnosis not present

## 2024-07-25 DIAGNOSIS — R232 Flushing: Secondary | ICD-10-CM | POA: Diagnosis not present

## 2024-07-25 DIAGNOSIS — E119 Type 2 diabetes mellitus without complications: Secondary | ICD-10-CM

## 2024-07-25 LAB — CBC WITH DIFFERENTIAL/PLATELET
Basophils Absolute: 0.1 K/uL (ref 0.0–0.1)
Basophils Relative: 0.6 % (ref 0.0–3.0)
Eosinophils Absolute: 0.1 K/uL (ref 0.0–0.7)
Eosinophils Relative: 1.7 % (ref 0.0–5.0)
HCT: 41.7 % (ref 36.0–46.0)
Hemoglobin: 14.3 g/dL (ref 12.0–15.0)
Lymphocytes Relative: 30.4 % (ref 12.0–46.0)
Lymphs Abs: 2.6 K/uL (ref 0.7–4.0)
MCHC: 34.2 g/dL (ref 30.0–36.0)
MCV: 89.6 fl (ref 78.0–100.0)
Monocytes Absolute: 0.5 K/uL (ref 0.1–1.0)
Monocytes Relative: 5.5 % (ref 3.0–12.0)
Neutro Abs: 5.2 K/uL (ref 1.4–7.7)
Neutrophils Relative %: 61.8 % (ref 43.0–77.0)
Platelets: 189 K/uL (ref 150.0–400.0)
RBC: 4.65 Mil/uL (ref 3.87–5.11)
RDW: 14.5 % (ref 11.5–15.5)
WBC: 8.5 K/uL (ref 4.0–10.5)

## 2024-07-25 LAB — COMPREHENSIVE METABOLIC PANEL WITH GFR
ALT: 50 U/L — ABNORMAL HIGH (ref 0–35)
AST: 37 U/L (ref 0–37)
Albumin: 4.2 g/dL (ref 3.5–5.2)
Alkaline Phosphatase: 101 U/L (ref 39–117)
BUN: 15 mg/dL (ref 6–23)
CO2: 29 meq/L (ref 19–32)
Calcium: 8.9 mg/dL (ref 8.4–10.5)
Chloride: 103 meq/L (ref 96–112)
Creatinine, Ser: 0.73 mg/dL (ref 0.40–1.20)
GFR: 81.25 mL/min (ref >60.00)
Glucose, Bld: 149 mg/dL — ABNORMAL HIGH (ref 70–99)
Potassium: 4.5 meq/L (ref 3.5–5.1)
Sodium: 140 meq/L (ref 135–145)
Total Bilirubin: 0.9 mg/dL (ref 0.2–1.2)
Total Protein: 6.5 g/dL (ref 6.0–8.3)

## 2024-07-25 LAB — POCT GLYCOSYLATED HEMOGLOBIN (HGB A1C): Hemoglobin A1C: 8.6 % — AB (ref 4.0–5.6)

## 2024-07-25 LAB — TSH: TSH: 2.3 u[IU]/mL (ref 0.35–5.50)

## 2024-07-25 MED ORDER — LANTUS SOLOSTAR 100 UNIT/ML ~~LOC~~ SOPN: 60.0000 [IU] | PEN_INJECTOR | Freq: Every day | SUBCUTANEOUS | Status: DC

## 2024-07-25 NOTE — Patient Instructions (Addendum)
 I'll check on the referral.  Go to the lab on the way out.   If you have mychart we'll likely use that to update you.    Take care.  Glad to see you. Let me know about the study medicine.

## 2024-07-25 NOTE — Progress Notes (Signed)
 Diabetes:  Using medications without difficulties: yes, usually taking 60 units insulin  per day with 2 metformin .  120-150 fasting, higher after eating.   Hypoglycemic episodes: no Hyperglycemic episodes: see above.   Feet problems: some numbness at baseline.  Blood Sugars averaging: see above.  eye exam within last year: yes A1c d/w pt at OV.  8.6.   Fructosamine pending.  She is enrolling in a GLP1 study and I asked her to update me about that.   URI sx.  Sx had resolved from prior, then started sneezing yesterday.  No fevers.  No sputum.  No cough.  Rhinorrhea.  No known sick contacts.  No facial pain. Taking mucinex .    She had more hot flashes and sweats.  Irregular onset.  Noted in the last month or so.  No fevers.  See notes on labs.    She didn't get a call about counseling or general surgery.  D/w pt- I will update the referral department.    Meds, vitals, and allergies reviewed.   ROS: Per HPI unless specifically indicated in ROS section   GEN: nad, alert and oriented HEENT: mucous membranes moist, clear rhinorrhea noted. OP wnl NECK: supple w/o LA CV: rrr. PULM: ctab, no inc wob ABD: soft, +bs EXT: no edema SKIN: no acute rash

## 2024-07-28 ENCOUNTER — Ambulatory Visit: Payer: Self-pay | Admitting: Family Medicine

## 2024-07-28 ENCOUNTER — Telehealth: Payer: Self-pay | Admitting: Family Medicine

## 2024-07-28 DIAGNOSIS — J3489 Other specified disorders of nose and nasal sinuses: Secondary | ICD-10-CM | POA: Insufficient documentation

## 2024-07-28 NOTE — Telephone Encounter (Signed)
 Please contact patient about general surgery and psychology referral.  Thanks.

## 2024-07-28 NOTE — Assessment & Plan Note (Signed)
 I will check on her referral.

## 2024-07-28 NOTE — Assessment & Plan Note (Signed)
 Had been taking mucinex .  She can update me as needed.

## 2024-07-28 NOTE — Assessment & Plan Note (Signed)
 A1c d/w pt at OV.  8.6.   Fructosamine pending.  She is enrolling in a GLP1 study and I asked her to update me about that.  That med may change the plan for her with DM2 mgmt.  Defer other changes until I hear about that.  She agrees.

## 2024-07-29 ENCOUNTER — Telehealth: Payer: Self-pay | Admitting: Family Medicine

## 2024-07-29 LAB — FRUCTOSAMINE: Fructosamine: 341 umol/L — ABNORMAL HIGH (ref 205–285)

## 2024-07-29 NOTE — Telephone Encounter (Signed)
 Is there an open slot on the schedule in clinic so patient can be seen sooner on Tuesday?

## 2024-07-30 ENCOUNTER — Encounter: Payer: Self-pay | Admitting: Family Medicine

## 2024-07-30 ENCOUNTER — Telehealth: Payer: Self-pay | Admitting: Family Medicine

## 2024-07-30 ENCOUNTER — Ambulatory Visit: Admitting: Family Medicine

## 2024-07-30 VITALS — BP 106/62 | HR 98 | Temp 97.3°F | Ht 66.5 in | Wt 183.1 lb

## 2024-07-30 DIAGNOSIS — R1013 Epigastric pain: Secondary | ICD-10-CM | POA: Diagnosis not present

## 2024-07-30 DIAGNOSIS — R110 Nausea: Secondary | ICD-10-CM | POA: Diagnosis not present

## 2024-07-30 LAB — COMPREHENSIVE METABOLIC PANEL WITH GFR
AG Ratio: 1.8 (calc) (ref 1.0–2.5)
ALT: 39 U/L — ABNORMAL HIGH (ref 6–29)
AST: 30 U/L (ref 10–35)
Albumin: 4.4 g/dL (ref 3.6–5.1)
Alkaline phosphatase (APISO): 92 U/L (ref 37–153)
BUN: 18 mg/dL (ref 7–25)
CO2: 28 mmol/L (ref 20–32)
Calcium: 9.6 mg/dL (ref 8.6–10.4)
Chloride: 102 mmol/L (ref 98–110)
Creat: 0.86 mg/dL (ref 0.60–1.00)
Globulin: 2.4 g/dL (ref 1.9–3.7)
Glucose, Bld: 115 mg/dL — ABNORMAL HIGH (ref 65–99)
Potassium: 4 mmol/L (ref 3.5–5.3)
Sodium: 137 mmol/L (ref 135–146)
Total Bilirubin: 0.9 mg/dL (ref 0.2–1.2)
Total Protein: 6.8 g/dL (ref 6.1–8.1)
eGFR: 71 mL/min/1.73m2 (ref >=60)

## 2024-07-30 LAB — CBC WITH DIFFERENTIAL/PLATELET
Absolute Lymphocytes: 3415 {cells}/uL (ref 850–3900)
Absolute Monocytes: 958 {cells}/uL — ABNORMAL HIGH (ref 200–950)
Basophils Absolute: 38 {cells}/uL (ref 0–200)
Basophils Relative: 0.3 %
Eosinophils Absolute: 227 {cells}/uL (ref 15–500)
Eosinophils Relative: 1.8 %
HCT: 42.7 % (ref 35.0–45.0)
Hemoglobin: 14.4 g/dL (ref 11.7–15.5)
MCH: 30.4 pg (ref 27.0–33.0)
MCHC: 33.7 g/dL (ref 32.0–36.0)
MCV: 90.3 fL (ref 80.0–100.0)
MPV: 9.7 fL (ref 7.5–12.5)
Monocytes Relative: 7.6 %
Neutro Abs: 7963 {cells}/uL — ABNORMAL HIGH (ref 1500–7800)
Neutrophils Relative %: 63.2 %
Platelets: 218 Thousand/uL (ref 140–400)
RBC: 4.73 Million/uL (ref 3.80–5.10)
RDW: 13.5 % (ref 11.0–15.0)
Total Lymphocyte: 27.1 %
WBC: 12.6 Thousand/uL — ABNORMAL HIGH (ref 3.8–10.8)

## 2024-07-30 LAB — LIPASE: Lipase: 17 U/L (ref 7–60)

## 2024-07-30 MED ORDER — ONDANSETRON 4 MG PO TBDP: 4.0000 mg | ORAL_TABLET | Freq: Three times a day (TID) | ORAL | 0 refills | Status: AC | PRN

## 2024-07-30 NOTE — Telephone Encounter (Signed)
No sooner appointment is available.

## 2024-07-30 NOTE — Progress Notes (Signed)
 Patient ID: Allison Yoder, female    DOB: 01-Sep-1951, 73 y.o.   MRN: [id number]  This visit was conducted in person.  BP 106/62   Pulse 98   Temp (!) 97.3 F (36.3 C) (Temporal)   Ht 5' 6.5 (1.689 m)   Wt 183 lb 2 oz (83.1 kg)   SpO2 97%   BMI 29.11 kg/m    CC:  Chief Complaint  Patient presents with   Nausea    Started Study Medication Maritide-1st Injection Thursday   Vomiting   Abdominal Pain    Subjective:   HPI: Allison Yoder is a 73 y.o. female presenting on 07/30/2024 for Nausea (Started Study Medication Maritide-1st Injection Thursday), Vomiting, and Abdominal Pain  Per communication with patient's PCP, primary concern is possible pancreatitis due to recent study medication started within the last week. [place]itime- CV.SABRA GLP1 and GIP med for weight management.   She reports new onset  nausea 24 hours later.. resulted in emesis, dry heave.  Abdominal soreness. Took some zofran .. helped minimally.    Since then she has been on a liquid diet, chicken stock, gatorade... no further emesis.  Did have an egg biscuit today and yesterday.. nausea following. Has had 60  oz  so far today.  Feeling tired.   No focal abdominal pain.SABRA after eating had upper epigastric pain.   No fever.    Blood sugars running 98-133 BP Readings from Last 3 Encounters:  07/30/24 106/62  07/25/24 122/78  06/07/24 136/82    Relevant past medical, surgical, family and social history reviewed and updated as indicated. Interim medical history since our last visit reviewed. Allergies and medications reviewed and updated. Outpatient Medications Prior to Visit  Medication Sig Dispense Refill   albuterol  (VENTOLIN  HFA) 108 (90 Base) MCG/ACT inhaler INHALE 1 TO 2 PUFFS INTO THE LUNGS EVERY 6 HOURS AS NEEDED FOR WHEEZING OR SHORTNESS OF BREATH 25.5 g 1   betamethasone valerate ointment (VALISONE) 0.1 % Apply 1 Application topically daily.     Blood Glucose Monitoring Suppl (ONE TOUCH ULTRA  2) w/Device KIT Check blood sugar once daily and as instructed. Dx E11.9 1 each 0   citalopram  (CELEXA ) 20 MG tablet Take 1.5 tablets (30 mg total) by mouth daily.     clonazePAM  (KLONOPIN ) 0.5 MG tablet Take 1 tablet (0.5 mg total) by mouth daily as needed for anxiety. 30 tablet 1   cyclobenzaprine  (FLEXERIL ) 10 MG tablet Take 1 tablet (10 mg total) by mouth daily as needed for muscle spasms. 90 tablet 3   glucose blood (ONETOUCH ULTRA) test strip as directed to check blood sugar daily. Dx E11.9 100 strip 2   insulin  glargine (LANTUS  SOLOSTAR) 100 UNIT/ML Solostar Pen Inject 60-62 Units into the skin daily.     Insulin  Pen Needle (BD PEN NEEDLE NANO 2ND GEN) 32G X 4 MM MISC USE WITH INSULIN  PEN EVERY DAY AS DIRECTED. 100 each 3   Lancets (ONETOUCH DELICA PLUS LANCET33G) MISC USE ONCE DAILY AS DIRECTED 100 each 3   lisinopril  (ZESTRIL ) 40 MG tablet Take 1 tablet (40 mg total) by mouth daily. 90 tablet 3   metFORMIN  (GLUCOPHAGE -XR) 500 MG 24 hr tablet Take 1 tablet (500 mg total) by mouth 2 (two) times daily with a meal.     metoprolol  succinate (TOPROL -XL) 50 MG 24 hr tablet TAKE 1 TABLET BY MOUTH  DAILY WITH OR IMMEDIATELY  FOLLOWING A MEAL 90 tablet 3   Multiple Vitamin (MULTIVITAMIN) tablet Take 1  tablet by mouth daily.     pantoprazole  (PROTONIX ) 40 MG tablet Take 1 tablet (40 mg total) by mouth daily. 90 tablet 3   REPATHA  SURECLICK 140 MG/ML SOAJ ADMINISTER 1 ML UNDER THE SKIN EVERY 14 DAYS 2 mL 11   Vitamin D , Cholecalciferol, 1000 units CAPS Take 3,000 Units by mouth daily.     ondansetron  (ZOFRAN -ODT) 4 MG disintegrating tablet Take 1 tablet (4 mg total) by mouth every 8 (eight) hours as needed for nausea. 20 tablet 0   No facility-administered medications prior to visit.     Per HPI unless specifically indicated in ROS section below Review of Systems  Constitutional:  Negative for fatigue and fever.  HENT:  Negative for congestion.   Eyes:  Negative for pain.  Respiratory:   Negative for cough and shortness of breath.   Cardiovascular:  Negative for chest pain, palpitations and leg swelling.  Gastrointestinal:  Positive for abdominal pain.  Genitourinary:  Negative for dysuria and vaginal bleeding.  Musculoskeletal:  Negative for back pain.  Neurological:  Negative for syncope, light-headedness and headaches.  Psychiatric/Behavioral:  Negative for dysphoric mood.    Objective:  BP 106/62   Pulse 98   Temp (!) 97.3 F (36.3 C) (Temporal)   Ht 5' 6.5 (1.689 m)   Wt 183 lb 2 oz (83.1 kg)   SpO2 97%   BMI 29.11 kg/m   Wt Readings from Last 3 Encounters:  07/30/24 183 lb 2 oz (83.1 kg)  07/25/24 190 lb (86.2 kg)  06/07/24 188 lb 9.6 oz (85.5 kg)      Physical Exam Constitutional:      General: She is not in acute distress.    Appearance: Normal appearance. She is well-developed. She is not ill-appearing or toxic-appearing.  HENT:     Head: Normocephalic.     Right Ear: Hearing, tympanic membrane, ear canal and external ear normal. Tympanic membrane is not erythematous, retracted or bulging.     Left Ear: Hearing, tympanic membrane, ear canal and external ear normal. Tympanic membrane is not erythematous, retracted or bulging.     Nose: No mucosal edema or rhinorrhea.     Right Sinus: No maxillary sinus tenderness or frontal sinus tenderness.     Left Sinus: No maxillary sinus tenderness or frontal sinus tenderness.     Mouth/Throat:     Pharynx: Uvula midline.  Eyes:     General: Lids are normal. Lids are everted, no foreign bodies appreciated.     Conjunctiva/sclera: Conjunctivae normal.     Pupils: Pupils are equal, round, and reactive to light.  Neck:     Thyroid : No thyroid  mass or thyromegaly.     Vascular: No carotid bruit.     Trachea: Trachea normal.  Cardiovascular:     Rate and Rhythm: Normal rate and regular rhythm.     Pulses: Normal pulses.     Heart sounds: Normal heart sounds, S1 normal and S2 normal. No murmur heard.    No  friction rub. No gallop.  Pulmonary:     Effort: Pulmonary effort is normal. No tachypnea or respiratory distress.     Breath sounds: Normal breath sounds. No decreased breath sounds, wheezing, rhonchi or rales.  Abdominal:     General: Bowel sounds are normal.     Palpations: Abdomen is soft.     Tenderness: There is abdominal tenderness in the epigastric area. There is no right CVA tenderness, left CVA tenderness, guarding or rebound. Negative signs include Murphy's  sign, McBurney's sign and psoas sign.  Musculoskeletal:     Cervical back: Normal range of motion and neck supple.  Skin:    General: Skin is warm and dry.     Findings: No rash.  Neurological:     Mental Status: She is alert.  Psychiatric:        Mood and Affect: Mood is not anxious or depressed.        Speech: Speech normal.        Behavior: Behavior normal. Behavior is cooperative.        Thought Content: Thought content normal.        Judgment: Judgment normal.       Results for orders placed or performed in visit on 07/25/24  POCT glycosylated hemoglobin (Hb A1C)   Collection Time: 07/25/24 10:03 AM  Result Value Ref Range   Hemoglobin A1C 8.6 (A) 4.0 - 5.6 %   HbA1c POC (<> result, manual entry)     HbA1c, POC (prediabetic range)     HbA1c, POC (controlled diabetic range)    CBC with Differential/Platelet   Collection Time: 07/25/24 10:25 AM  Result Value Ref Range   WBC 8.5 4.0 - 10.5 K/uL   RBC 4.65 3.87 - 5.11 Mil/uL   Hemoglobin 14.3 12.0 - 15.0 g/dL   HCT 58.2 63.9 - 53.9 %   MCV 89.6 78.0 - 100.0 fl   MCHC 34.2 30.0 - 36.0 g/dL   RDW 85.4 88.4 - 84.4 %   Platelets 189.0 150.0 - 400.0 K/uL   Neutrophils Relative % 61.8 43.0 - 77.0 %   Lymphocytes Relative 30.4 12.0 - 46.0 %   Monocytes Relative 5.5 3.0 - 12.0 %   Eosinophils Relative 1.7 0.0 - 5.0 %   Basophils Relative 0.6 0.0 - 3.0 %   Neutro Abs 5.2 1.4 - 7.7 K/uL   Lymphs Abs 2.6 0.7 - 4.0 K/uL   Monocytes Absolute 0.5 0.1 - 1.0 K/uL    Eosinophils Absolute 0.1 0.0 - 0.7 K/uL   Basophils Absolute 0.1 0.0 - 0.1 K/uL  Comprehensive metabolic panel with GFR   Collection Time: 07/25/24 10:25 AM  Result Value Ref Range   Sodium 140 135 - 145 mEq/L   Potassium 4.5 3.5 - 5.1 mEq/L   Chloride 103 96 - 112 mEq/L   CO2 29 19 - 32 mEq/L   Glucose, Bld 149 (H) 70 - 99 mg/dL   BUN 15 6 - 23 mg/dL   Creatinine, Ser 9.26 0.40 - 1.20 mg/dL   Total Bilirubin 0.9 0.2 - 1.2 mg/dL   Alkaline Phosphatase 101 39 - 117 U/L   AST 37 0 - 37 U/L   ALT 50 (H) 0 - 35 U/L   Total Protein 6.5 6.0 - 8.3 g/dL   Albumin 4.2 3.5 - 5.2 g/dL   GFR 18.74 >39.99 mL/min   Calcium  8.9 8.4 - 10.5 mg/dL  TSH   Collection Time: 07/25/24 10:25 AM  Result Value Ref Range   TSH 2.30 0.35 - 5.50 uIU/mL  Fructosamine   Collection Time: 07/25/24 10:25 AM  Result Value Ref Range   Fructosamine 341 (H) 205 - 285 umol/L    Assessment and Plan  Epigastric pain Assessment & Plan: Acute.. started after study drug injection given ( GLP1/GIP combo). May be SE vs concerning reaction.  Will eval with CMET, lipase and cbc.  Provided zofran  for nausea.  Pt well hydrated.  Return and ER precautions given  Orders: -  Comprehensive metabolic panel with GFR -     Lipase -     CBC with Differential/Platelet  Nausea -     Ondansetron ; Take 1 tablet (4 mg total) by mouth every 8 (eight) hours as needed for nausea.  Dispense: 20 tablet; Refill: 0  Epigastric abdominal pain Assessment & Plan: Acute.. started after study drug injection given ( GLP1/GIP combo). May be SE vs concerning reaction.  Will eval with CMET, lipase and cbc.  Provided zofran  for nausea.  Pt well hydrated.  Return and ER precautions given     No follow-ups on file.   Greig Ring, MD

## 2024-07-30 NOTE — Assessment & Plan Note (Signed)
 Acute.. started after study drug injection given ( GLP1/GIP combo). May be SE vs concerning reaction.  Will eval with CMET, lipase and cbc.  Provided zofran  for nausea.  Pt well hydrated.  Return and ER precautions given

## 2024-07-30 NOTE — Telephone Encounter (Signed)
 Thank you for seeing this patient today.  The main concern is for possible med induced pancreatitis.  She is enrolled in a research trial.  I appreciate your help.

## 2024-07-30 NOTE — Telephone Encounter (Signed)
 Allison Yoder was in the office today seeing Dr. Avelina.  She states she is suppose to have a refill on her Lantus  insulin  for patient assistance program.  I checked refrigerator but did not see anything that belong to her.  I also do not see any documention of recent order being received.    She also states she is having trouble getting her Libre 3 sensors.  She is being told that the company needs to speak to someone about Dr. Elfredia documentation.    Dr. Cleatus ask that I send message to pharmacy team to follow up on these 2 items.

## 2024-07-30 NOTE — Addendum Note (Signed)
 Addended by: HOPE VEVA PARAS on: 07/30/2024 04:18 PM   Modules accepted: Orders

## 2024-07-31 ENCOUNTER — Encounter: Payer: Self-pay | Admitting: *Deleted

## 2024-07-31 ENCOUNTER — Ambulatory Visit: Payer: Self-pay | Admitting: Family Medicine

## 2024-07-31 ENCOUNTER — Telehealth: Payer: Self-pay

## 2024-07-31 NOTE — Telephone Encounter (Signed)
 Gave company a call [employer]) pt is due for a refill has not received, spoke at [employer] with representative said they will mailone out to provider office will take 7-10 days. Spoke with pt is aware she will received with in 7-10 days.

## 2024-07-31 NOTE — Telephone Encounter (Signed)
 This was suppose to go to the Medication Asssistance Team who does Patient Assistance to re-order patient's Lantus  from [employer].

## 2024-07-31 NOTE — Telephone Encounter (Signed)
 Referrals sent to appropriate locations.   Patient notified via MyChart letter and Direct Messages.

## 2024-08-01 ENCOUNTER — Telehealth: Payer: Self-pay

## 2024-08-01 NOTE — Telephone Encounter (Signed)
 Mail out pt portion [facility] Lantus  per pt request faxed provider portion.

## 2024-08-01 NOTE — Telephone Encounter (Signed)
 Received patient assistance medications for patient.  Lantus  * 3boxes  Medications have been placed in the refrigerator and labeled with patient information  and Patient has been informed can pick up medications in our office during normal business hours.

## 2024-08-05 ENCOUNTER — Telehealth: Payer: Self-pay | Admitting: Family Medicine

## 2024-08-05 ENCOUNTER — Encounter: Payer: Self-pay | Admitting: Family Medicine

## 2024-08-05 NOTE — Telephone Encounter (Signed)
 Copied from CRM #[id number]. Topic: Medical Record Request - Provider/Facility Request >> Aug 05, 2024  3:21 PM Alexandria E wrote: Reason for CRM: Junji with [employer] is requesting the medical records of patient's visit with Dr. Avelina on 07/30/2024 as well as those lab results. Callback number for [facility] is [phone] with fax number being [phone]. [facility] is questioning if this can get sent before Thanksgiving.

## 2024-08-06 ENCOUNTER — Emergency Department (HOSPITAL_COMMUNITY)

## 2024-08-06 ENCOUNTER — Observation Stay (HOSPITAL_COMMUNITY)
Admission: EM | Admit: 2024-08-06 | Discharge: 2024-08-08 | Disposition: A | Discharge status: Home / Self Care | Attending: Internal Medicine | Admitting: Internal Medicine

## 2024-08-06 ENCOUNTER — Encounter (HOSPITAL_COMMUNITY): Payer: Self-pay

## 2024-08-06 ENCOUNTER — Other Ambulatory Visit: Payer: Self-pay

## 2024-08-06 DIAGNOSIS — F419 Anxiety disorder, unspecified: Secondary | ICD-10-CM | POA: Diagnosis not present

## 2024-08-06 DIAGNOSIS — I10 Essential (primary) hypertension: Secondary | ICD-10-CM | POA: Diagnosis present

## 2024-08-06 DIAGNOSIS — E1169 Type 2 diabetes mellitus with other specified complication: Secondary | ICD-10-CM | POA: Diagnosis present

## 2024-08-06 DIAGNOSIS — R112 Nausea with vomiting, unspecified: Principal | ICD-10-CM | POA: Diagnosis present

## 2024-08-06 DIAGNOSIS — E039 Hypothyroidism, unspecified: Secondary | ICD-10-CM | POA: Insufficient documentation

## 2024-08-06 DIAGNOSIS — E785 Hyperlipidemia, unspecified: Secondary | ICD-10-CM | POA: Insufficient documentation

## 2024-08-06 DIAGNOSIS — F321 Major depressive disorder, single episode, moderate: Secondary | ICD-10-CM | POA: Diagnosis not present

## 2024-08-06 DIAGNOSIS — E119 Type 2 diabetes mellitus without complications: Secondary | ICD-10-CM

## 2024-08-06 DIAGNOSIS — R9082 White matter disease, unspecified: Secondary | ICD-10-CM | POA: Diagnosis not present

## 2024-08-06 DIAGNOSIS — K5730 Diverticulosis of large intestine without perforation or abscess without bleeding: Secondary | ICD-10-CM | POA: Diagnosis not present

## 2024-08-06 DIAGNOSIS — Z79899 Other long term (current) drug therapy: Secondary | ICD-10-CM | POA: Insufficient documentation

## 2024-08-06 DIAGNOSIS — D72829 Elevated white blood cell count, unspecified: Secondary | ICD-10-CM | POA: Diagnosis not present

## 2024-08-06 DIAGNOSIS — G9340 Encephalopathy, unspecified: Secondary | ICD-10-CM | POA: Diagnosis not present

## 2024-08-06 DIAGNOSIS — Z794 Long term (current) use of insulin: Secondary | ICD-10-CM | POA: Insufficient documentation

## 2024-08-06 DIAGNOSIS — F32A Depression, unspecified: Secondary | ICD-10-CM | POA: Diagnosis present

## 2024-08-06 DIAGNOSIS — Z87891 Personal history of nicotine dependence: Secondary | ICD-10-CM | POA: Insufficient documentation

## 2024-08-06 DIAGNOSIS — R079 Chest pain, unspecified: Secondary | ICD-10-CM | POA: Diagnosis not present

## 2024-08-06 DIAGNOSIS — M1990 Unspecified osteoarthritis, unspecified site: Secondary | ICD-10-CM | POA: Diagnosis present

## 2024-08-06 DIAGNOSIS — Z91040 Latex allergy status: Secondary | ICD-10-CM | POA: Diagnosis not present

## 2024-08-06 DIAGNOSIS — R911 Solitary pulmonary nodule: Secondary | ICD-10-CM | POA: Diagnosis not present

## 2024-08-06 DIAGNOSIS — R4182 Altered mental status, unspecified: Secondary | ICD-10-CM | POA: Diagnosis not present

## 2024-08-06 DIAGNOSIS — R000 Tachycardia, unspecified: Secondary | ICD-10-CM | POA: Diagnosis not present

## 2024-08-06 DIAGNOSIS — F329 Major depressive disorder, single episode, unspecified: Secondary | ICD-10-CM | POA: Diagnosis present

## 2024-08-06 DIAGNOSIS — R410 Disorientation, unspecified: Secondary | ICD-10-CM

## 2024-08-06 DIAGNOSIS — E1165 Type 2 diabetes mellitus with hyperglycemia: Secondary | ICD-10-CM | POA: Insufficient documentation

## 2024-08-06 DIAGNOSIS — R0789 Other chest pain: Secondary | ICD-10-CM | POA: Diagnosis not present

## 2024-08-06 DIAGNOSIS — R072 Precordial pain: Principal | ICD-10-CM

## 2024-08-06 LAB — TROPONIN I (HIGH SENSITIVITY)
Troponin I (High Sensitivity): 8 ng/L (ref <18)
Troponin I (High Sensitivity): 8 ng/L (ref <18)

## 2024-08-06 LAB — COMPREHENSIVE METABOLIC PANEL WITH GFR
ALT: 39 U/L (ref 0–44)
AST: 39 U/L (ref 15–41)
Albumin: 4.2 g/dL (ref 3.5–5.0)
Alkaline Phosphatase: 85 U/L (ref 38–126)
Anion gap: 15 (ref 5–15)
BUN: 12 mg/dL (ref 8–23)
CO2: 21 mmol/L — ABNORMAL LOW (ref 22–32)
Calcium: 9.9 mg/dL (ref 8.9–10.3)
Chloride: 105 mmol/L (ref 98–111)
Creatinine, Ser: 0.95 mg/dL (ref 0.44–1.00)
GFR, Estimated: >60 mL/min (ref >60)
Glucose, Bld: 251 mg/dL — ABNORMAL HIGH (ref 70–99)
Potassium: 4.6 mmol/L (ref 3.5–5.1)
Sodium: 141 mmol/L (ref 135–145)
Total Bilirubin: 1.3 mg/dL — ABNORMAL HIGH (ref 0.0–1.2)
Total Protein: 7 g/dL (ref 6.5–8.1)

## 2024-08-06 LAB — I-STAT VENOUS BLOOD GAS, ED
Acid-base deficit: 1 mmol/L (ref 0.0–2.0)
Bicarbonate: 23.5 mmol/L (ref 20.0–28.0)
Calcium, Ion: 1.14 mmol/L — ABNORMAL LOW (ref 1.15–1.40)
HCT: 42 % (ref 36.0–46.0)
Hemoglobin: 14.3 g/dL (ref 12.0–15.0)
O2 Saturation: 84 %
Potassium: 3.9 mmol/L (ref 3.5–5.1)
Sodium: 141 mmol/L (ref 135–145)
TCO2: 25 mmol/L (ref 22–32)
pCO2, Ven: 37.9 mmHg — ABNORMAL LOW (ref 44–60)
pH, Ven: 7.401 (ref 7.25–7.43)
pO2, Ven: 49 mmHg — ABNORMAL HIGH (ref 32–45)

## 2024-08-06 LAB — CG4 I-STAT (LACTIC ACID): Lactic Acid, Venous: 12.3 mmol/L (ref 0.5–1.9)

## 2024-08-06 LAB — CBC
HCT: 45.8 % (ref 36.0–46.0)
Hemoglobin: 15.5 g/dL — ABNORMAL HIGH (ref 12.0–15.0)
MCH: 30.3 pg (ref 26.0–34.0)
MCHC: 33.8 g/dL (ref 30.0–36.0)
MCV: 89.6 fL (ref 80.0–100.0)
Platelets: 231 K/uL (ref 150–400)
RBC: 5.11 MIL/uL (ref 3.87–5.11)
RDW: 13.4 % (ref 11.5–15.5)
WBC: 19 K/uL — ABNORMAL HIGH (ref 4.0–10.5)
nRBC: 0 % (ref 0.0–0.2)

## 2024-08-06 LAB — URINALYSIS, W/ REFLEX TO CULTURE (INFECTION SUSPECTED)
Bilirubin Urine: NEGATIVE
Glucose, UA: NEGATIVE mg/dL
Hgb urine dipstick: NEGATIVE
Ketones, ur: 20 mg/dL — AB
Leukocytes,Ua: NEGATIVE
Nitrite: NEGATIVE
Protein, ur: NEGATIVE mg/dL
Specific Gravity, Urine: 1.039 — ABNORMAL HIGH (ref 1.005–1.030)
pH: 6 (ref 5.0–8.0)

## 2024-08-06 LAB — GLUCOSE, CAPILLARY
Glucose-Capillary: 111 mg/dL — ABNORMAL HIGH (ref 70–99)
Glucose-Capillary: 97 mg/dL (ref 70–99)

## 2024-08-06 LAB — I-STAT CHEM 8, ED
BUN: 15 mg/dL (ref 8–23)
Calcium, Ion: 1.13 mmol/L — ABNORMAL LOW (ref 1.15–1.40)
Chloride: 103 mmol/L (ref 98–111)
Creatinine, Ser: 1 mg/dL (ref 0.44–1.00)
Glucose, Bld: 179 mg/dL — ABNORMAL HIGH (ref 70–99)
HCT: 46 % (ref 36.0–46.0)
Hemoglobin: 15.6 g/dL — ABNORMAL HIGH (ref 12.0–15.0)
Potassium: 4.5 mmol/L (ref 3.5–5.1)
Sodium: 140 mmol/L (ref 135–145)
TCO2: 24 mmol/L (ref 22–32)

## 2024-08-06 LAB — LIPASE, BLOOD: Lipase: 40 U/L (ref 11–51)

## 2024-08-06 LAB — AMMONIA: Ammonia: <13 umol/L (ref 9–35)

## 2024-08-06 LAB — HEMOGLOBIN A1C
Hgb A1c MFr Bld: 7.8 % — ABNORMAL HIGH (ref 4.8–5.6)
Mean Plasma Glucose: 177.16 mg/dL

## 2024-08-06 LAB — CBG MONITORING, ED: Glucose-Capillary: 179 mg/dL — ABNORMAL HIGH (ref 70–99)

## 2024-08-06 MED ORDER — PANTOPRAZOLE SODIUM 40 MG IV SOLR
40.0000 mg | Freq: Once | INTRAVENOUS | Status: AC
  Administered 2024-08-06: 40 mg via INTRAVENOUS
  Filled 2024-08-06: qty 10

## 2024-08-06 MED ORDER — ACETAMINOPHEN 325 MG PO TABS: 650.0000 mg | ORAL_TABLET | Freq: Four times a day (QID) | ORAL | Status: DC | PRN

## 2024-08-06 MED ORDER — IOHEXOL 350 MG/ML SOLN
100.0000 mL | Freq: Once | INTRAVENOUS | Status: AC | PRN
  Administered 2024-08-06: 100 mL via INTRAVENOUS

## 2024-08-06 MED ORDER — ONDANSETRON HCL 4 MG/2ML IJ SOLN: 4.0000 mg | Freq: Four times a day (QID) | INTRAMUSCULAR | Status: DC | PRN

## 2024-08-06 MED ORDER — METOPROLOL SUCCINATE ER 50 MG PO TB24
50.0000 mg | ORAL_TABLET | Freq: Every day | ORAL | Status: DC
  Administered 2024-08-06 – 2024-08-08 (×3): 50 mg via ORAL
  Filled 2024-08-06 (×2): qty 1
  Filled 2024-08-06: qty 2

## 2024-08-06 MED ORDER — METOCLOPRAMIDE HCL 5 MG/ML IJ SOLN
10.0000 mg | Freq: Once | INTRAMUSCULAR | Status: AC
  Administered 2024-08-06: 10 mg via INTRAVENOUS
  Filled 2024-08-06: qty 2

## 2024-08-06 MED ORDER — DOCUSATE SODIUM 100 MG PO CAPS
100.0000 mg | ORAL_CAPSULE | Freq: Two times a day (BID) | ORAL | Status: DC
  Filled 2024-08-06 (×3): qty 1

## 2024-08-06 MED ORDER — CLONAZEPAM 0.5 MG PO TABS: 0.5000 mg | ORAL_TABLET | Freq: Every day | ORAL | Status: DC | PRN

## 2024-08-06 MED ORDER — LISINOPRIL 20 MG PO TABS
40.0000 mg | ORAL_TABLET | Freq: Every day | ORAL | Status: DC
  Administered 2024-08-06 – 2024-08-08 (×3): 40 mg via ORAL
  Filled 2024-08-06 (×3): qty 2

## 2024-08-06 MED ORDER — ONDANSETRON HCL 4 MG PO TABS: 4.0000 mg | ORAL_TABLET | Freq: Four times a day (QID) | ORAL | Status: DC | PRN

## 2024-08-06 MED ORDER — LORAZEPAM 1 MG PO TABS
0.5000 mg | ORAL_TABLET | Freq: Once | ORAL | Status: AC
  Administered 2024-08-06: 0.5 mg via SUBLINGUAL
  Filled 2024-08-06: qty 1

## 2024-08-06 MED ORDER — ACETAMINOPHEN 650 MG RE SUPP: 650.0000 mg | Freq: Four times a day (QID) | RECTAL | Status: DC | PRN

## 2024-08-06 MED ORDER — PANTOPRAZOLE SODIUM 40 MG IV SOLR
40.0000 mg | INTRAVENOUS | Status: DC
  Administered 2024-08-06 – 2024-08-08 (×3): 40 mg via INTRAVENOUS
  Filled 2024-08-06 (×3): qty 10

## 2024-08-06 MED ORDER — INSULIN ASPART 100 UNIT/ML IJ SOLN
0.0000 [IU] | Freq: Three times a day (TID) | INTRAMUSCULAR | Status: DC
  Administered 2024-08-07: 2 [IU] via SUBCUTANEOUS
  Administered 2024-08-07 (×2): 1 [IU] via SUBCUTANEOUS
  Administered 2024-08-08: 2 [IU] via SUBCUTANEOUS
  Filled 2024-08-06: qty 1
  Filled 2024-08-06 (×3): qty 2

## 2024-08-06 MED ORDER — ONDANSETRON HCL 4 MG/2ML IJ SOLN
4.0000 mg | Freq: Once | INTRAMUSCULAR | Status: AC
Start: 2024-08-06 — End: 2024-08-06
  Administered 2024-08-06: 4 mg via INTRAVENOUS
  Filled 2024-08-06: qty 2

## 2024-08-06 MED ORDER — SODIUM CHLORIDE 0.9 % IV BOLUS
1000.0000 mL | Freq: Once | INTRAVENOUS | Status: AC
  Administered 2024-08-06: 1000 mL via INTRAVENOUS

## 2024-08-06 MED ORDER — CITALOPRAM HYDROBROMIDE 20 MG PO TABS
30.0000 mg | ORAL_TABLET | Freq: Every day | ORAL | Status: DC
  Administered 2024-08-06 – 2024-08-08 (×3): 30 mg via ORAL
  Filled 2024-08-06 (×2): qty 2
  Filled 2024-08-06: qty 3

## 2024-08-06 MED ORDER — SODIUM CHLORIDE 0.9 % IV SOLN: INTRAVENOUS | Status: AC

## 2024-08-06 MED ORDER — ENOXAPARIN SODIUM 40 MG/0.4ML IJ SOSY
40.0000 mg | PREFILLED_SYRINGE | INTRAMUSCULAR | Status: DC
  Administered 2024-08-06 – 2024-08-08 (×3): 40 mg via SUBCUTANEOUS
  Filled 2024-08-06 (×3): qty 0.4

## 2024-08-06 MED ORDER — INSULIN GLARGINE-YFGN 100 UNIT/ML ~~LOC~~ SOLN
40.0000 [IU] | SUBCUTANEOUS | Status: DC
  Administered 2024-08-06 – 2024-08-07 (×2): 40 [IU] via SUBCUTANEOUS
  Filled 2024-08-06 (×3): qty 0.4

## 2024-08-06 NOTE — Plan of Care (Signed)
  Problem: Education: Goal: Ability to describe self-care measures that may prevent or decrease complications (Diabetes Survival Skills Education) will improve Outcome: Progressing   Problem: Coping: Goal: Ability to adjust to condition or change in health will improve Outcome: Progressing   Problem: Health Behavior/Discharge Planning: Goal: Ability to identify and utilize available resources and services will improve Outcome: Progressing

## 2024-08-06 NOTE — ED Notes (Signed)
 Pt transported to MRI

## 2024-08-06 NOTE — Hospital Course (Signed)
  73-yo female phx of DM type II, generalized anxiety disorder, essential hypertension, hyperlipidemia, and reactive airway disease presents to the emergency department with complaining of left-sided chest pain shortness of breath.  Patient unable to provide much history as she is confused.  Per EMS patient started on GLP-1 few weeks ago week ago however it has been stopped 1 week ago.  Patient is experiencing nausea, vomiting diarrhea since the stoppage of GLP-1.  Patient woke up 2 hours ago with sudden onset of left-sided chest pain.  Received aspirin  and sublingual nitroglycerin as well as Zofran .  Patient's brother who lives contacted and he states that her confusion is unusual.  At presentation to ED patient is tachycardic and borderline hypertensive.  O2 sat 100% room air.  Heart rate improved to 10 4-96.  Afebrile.  Imaging, chest x-ray unremarkable. CT angio chest abdomen pelvis unremarkable.  No evidence of acute aortic syndrome. CT head no acute intracranial abnormality. Pending MRI of the brain.  Lab, CBC showing leukocytosis 19 otherwise unremarkable.  Blood glucose 179.  Troponin 8 within normal range.  Normal lipase level.  CMP showed low bicarb 21 elevated blood glucose 251 and elevated bilirubin 1.3.  EKG showing sinus tachycardia heart rate 106.  Dr. Griselda will follow-up with second troponin level and MRI of the brain result.  In the ED patient received Protonix  and 1 L of NS bolus.  There is no ST and T wave abnormality.  Hospitalist consulted for management of altered mental status in terms of confusion left-sided chest pain.

## 2024-08-06 NOTE — ED Provider Notes (Signed)
 [place] EMERGENCY DEPARTMENT AT [facility] Provider Note   CSN: [id number] Arrival date & time: 08/06/24  0254     Patient presents with: Chest Pain   Allison Yoder is a 73 y.o. female.   The history is provided by the patient, the EMS personnel, a relative and medical records.  Chest Pain Allison Yoder is a 73 y.o. female who presents to the Emergency Department complaining of chest pain.  She presents to the emergency department complaining of left-sided chest pain, shortness of breath.  Patient is unable to provide much history as she is confused and unsure why she is here.  Per EMS reports she started GLP-1 1 week ago and has been experiencing vomiting and diarrhea since initiating the drug.  She woke up 2 hours ago with sudden onset left chest pain.  She received 324 aspirin , 2 sublingual nitroglycerin as well as 8 mg Zofran  ODT prior to ED arrival.  Patient's brother, who she lives with was contacted after her initial arrival and states that confusion is unusual for her and that she was the one to call 911.      Prior to Admission medications   Medication Sig Start Date End Date Taking? Authorizing Provider  albuterol  (VENTOLIN  HFA) 108 (90 Base) MCG/ACT inhaler INHALE 1 TO 2 PUFFS INTO THE LUNGS EVERY 6 HOURS AS NEEDED FOR WHEEZING OR SHORTNESS OF BREATH 01/19/22   Dugal, Tabitha, FNP  betamethasone  valerate ointment (VALISONE ) 0.1 % Apply 1 Application topically daily. 05/12/23   [provider]  Blood Glucose Monitoring Suppl (ONE TOUCH ULTRA 2) w/Device KIT Check blood sugar once daily and as instructed. Dx E11.9 12/29/16   Cleatus Arlyss RAMAN, MD  citalopram  (CELEXA ) 20 MG tablet Take 1.5 tablets (30 mg total) by mouth daily. 04/08/24   Cleatus Arlyss RAMAN, MD  clonazePAM  (KLONOPIN ) 0.5 MG tablet Take 1 tablet (0.5 mg total) by mouth daily as needed for anxiety. 11/16/21   Cleatus Arlyss RAMAN, MD  cyclobenzaprine  (FLEXERIL ) 10 MG tablet Take 1 tablet (10 mg total)  by mouth daily as needed for muscle spasms. 09/03/19   Cleatus Arlyss RAMAN, MD  glucose blood (ONETOUCH ULTRA) test strip as directed to check blood sugar daily. Dx E11.9 11/10/22   Cleatus Arlyss RAMAN, MD  insulin  glargine (LANTUS  SOLOSTAR) 100 UNIT/ML Solostar Pen Inject 60-62 Units into the skin daily. 07/25/24   Cleatus Arlyss RAMAN, MD  Insulin  Pen Needle (BD PEN NEEDLE NANO 2ND GEN) 32G X 4 MM MISC USE WITH INSULIN  PEN EVERY DAY AS DIRECTED. 02/22/24   Cleatus Arlyss RAMAN, MD  Lancets [facility] DELICA PLUS [place]) MISC USE ONCE DAILY AS DIRECTED 11/15/22   Cleatus Arlyss RAMAN, MD  lisinopril  (ZESTRIL ) 40 MG tablet Take 1 tablet (40 mg total) by mouth daily. 09/26/23   Cleatus Arlyss RAMAN, MD  metFORMIN  (GLUCOPHAGE -XR) 500 MG 24 hr tablet Take 1 tablet (500 mg total) by mouth 2 (two) times daily with a meal. 06/07/24   Cleatus Arlyss RAMAN, MD  metoprolol  succinate (TOPROL -XL) 50 MG 24 hr tablet TAKE 1 TABLET BY MOUTH  DAILY WITH OR IMMEDIATELY  FOLLOWING A MEAL 09/26/23   Cleatus Arlyss RAMAN, MD  Multiple Vitamin (MULTIVITAMIN) tablet Take 1 tablet by mouth daily.    [provider]  ondansetron  (ZOFRAN -ODT) 4 MG disintegrating tablet Take 1 tablet (4 mg total) by mouth every 8 (eight) hours as needed for nausea. 07/30/24   Bedsole, Amy E, MD  pantoprazole  (PROTONIX ) 40 MG tablet  Take 1 tablet (40 mg total) by mouth daily. 09/26/23   Cleatus Arlyss RAMAN, MD  REPATHA  SURECLICK 140 MG/ML SOAJ ADMINISTER 1 ML UNDER THE SKIN EVERY 14 DAYS 08/09/23   Nishan, Peter C, MD  Vitamin D , Cholecalciferol, 1000 units CAPS Take 3,000 Units by mouth daily.    [provider]    Allergies: Cephalexin, Codeine, Inapsine [droperidol], Crestor  [rosuvastatin ], Lipitor [atorvastatin  calcium ], Metformin  and related, Niacin and related, Pravastatin, Adhesive [tape], Latex, and Wound dressing adhesive    Review of Systems  Cardiovascular:  Positive for chest pain.  All other systems reviewed and are negative.   Updated Vital  Signs BP (!) 160/74   Pulse 96   Temp (!) 97.4 F (36.3 C) (Oral)   Resp 16   SpO2 100%   Physical Exam Vitals and nursing note reviewed.  Constitutional:      General: She is in acute distress.     Appearance: She is well-developed. She is ill-appearing and diaphoretic.  HENT:     Head: Normocephalic and atraumatic.  Cardiovascular:     Rate and Rhythm: Regular rhythm. Tachycardia present.     Heart sounds: No murmur heard. Pulmonary:     Effort: Pulmonary effort is normal. No respiratory distress.     Breath sounds: Normal breath sounds.  Abdominal:     Palpations: Abdomen is soft.     Tenderness: There is no guarding or rebound.     Comments: Moderate generalized abdominal tenderness  Musculoskeletal:        General: No tenderness.  Skin:    General: Skin is warm.     Capillary Refill: Capillary refill takes less than 2 seconds.     Coloration: Skin is pale.  Neurological:     Mental Status: She is alert.     Comments: No asymmetry of facial movements.  Visual fields grossly intact.  5 out of 5 strength in all 4 extremities with sensation to light touch intact in all 4 extremities.  Fluent speech.  Repetitive questioning and confused.  Disoriented to time and recent events.  Oriented to hospital.  Psychiatric:        Behavior: Behavior normal.     (all labs ordered are listed, but only abnormal results are displayed) Labs Reviewed  CBC - Abnormal; Notable for the following components:      Result Value   WBC 19.0 (*)    Hemoglobin 15.5 (*)    All other components within normal limits  COMPREHENSIVE METABOLIC PANEL WITH GFR - Abnormal; Notable for the following components:   CO2 21 (*)    Glucose, Bld 251 (*)    Total Bilirubin 1.3 (*)    All other components within normal limits  I-STAT CHEM 8, ED - Abnormal; Notable for the following components:   Glucose, Bld 179 (*)    Calcium , Ion 1.13 (*)    Hemoglobin 15.6 (*)    All other components within normal  limits  CBG MONITORING, ED - Abnormal; Notable for the following components:   Glucose-Capillary 179 (*)    All other components within normal limits  LIPASE, BLOOD  URINALYSIS, W/ REFLEX TO CULTURE (INFECTION SUSPECTED)  TROPONIN I (HIGH SENSITIVITY)  TROPONIN I (HIGH SENSITIVITY)    EKG: EKG Interpretation Date/Time:  Tuesday August 06 2024 03:07:27 EST Ventricular Rate:  106 PR Interval:  159 QRS Duration:  96 QT Interval:  339 QTC Calculation: 451 R Axis:   26  Text Interpretation: Sinus tachycardia Probable left atrial  enlargement Confirmed by Griselda Norris [phone]) on 08/06/2024 3:13:23 AM  Radiology: MR BRAIN WO CONTRAST Result Date: 08/06/2024 EXAM: MRI BRAIN WITHOUT CONTRAST 08/06/2024 05:45:37 AM TECHNIQUE: Multiplanar multisequence MRI of the head/brain was performed without the administration of intravenous contrast. COMPARISON: MRI of the head dated 05/20/2021. CLINICAL HISTORY: Mental status change, unknown cause. FINDINGS: BRAIN AND VENTRICLES: No acute infarct. No intracranial hemorrhage. No mass. No midline shift. No hydrocephalus. Mild periventricular and subcortical white matter disease present. The sella is unremarkable. Normal flow voids. ORBITS: No acute abnormality. SINUSES AND MASTOIDS: No acute abnormality. BONES AND SOFT TISSUES: Normal marrow signal. No acute soft tissue abnormality. IMPRESSION: 1. No acute intracranial abnormality. 2. Mild periventricular and subcortical white matter disease. Electronically signed by: Evalene Coho MD 08/06/2024 05:50 AM EST RP Workstation: [id number]   CT Angio Chest/Abd/Pel for Dissection W and/or W/WO Result Date: 08/06/2024 EXAM: CTA CHEST, ABDOMEN AND PELVIS WITHOUT AND WITH CONTRAST 08/06/2024 04:00:29 AM TECHNIQUE: CTA of the chest was performed without and with the administration of intravenous contrast. CTA of the abdomen and pelvis was performed without and with the administration of intravenous contrast. 100  mL of iohexol  (OMNIPAQUE ) 350 MG/ML injection was administered. Multiplanar reformatted images are provided for review. MIP images are provided for review. Automated exposure control, iterative reconstruction, and/or weight based adjustment of the mA/kV was utilized to reduce the radiation dose to as low as reasonably achievable. COMPARISON: CT of the abdomen and pelvis dated 05/14/2023. CLINICAL HISTORY: Acute aortic syndrome (AAS) suspected. FINDINGS: VASCULATURE: AORTA: The thoracic aorta is normal in caliber and demonstrates mild-to-moderate calcific atheromatous disease. There is no evidence of acute aortic syndrome. The abdominal aorta demonstrates moderate calcific atheromatous disease. No abdominal aortic aneurysm. No dissection. PULMONARY ARTERIES: The pulmonary arteries are normal in caliber and appear widely patent. No pulmonary embolism with the limits of this exam. GREAT VESSELS OF AORTIC ARCH: No acute finding. No dissection. No arterial occlusion or significant stenosis. CELIAC TRUNK: No acute finding. No occlusion or significant stenosis. SUPERIOR MESENTERIC ARTERY: Widely patent. No acute finding. No occlusion or significant stenosis. INFERIOR MESENTERIC ARTERY: Widely patent. No acute finding. No occlusion or significant stenosis. RENAL ARTERIES: Widely patent. No acute finding. No occlusion or significant stenosis. ILIAC ARTERIES: No acute finding. No occlusion or significant stenosis. CHEST: MEDIASTINUM: No mediastinal lymphadenopathy. The heart and pericardium demonstrate no acute abnormality. There is moderate calcific coronary artery disease present. LUNGS AND PLEURA: There is a 2 to 3 mm nodule present laterally within the right lower lobe on image 80 of series 9. There is minimal dependent atelectasis. No focal consolidation or pulmonary edema. No evidence of pleural effusion or pneumothorax. THORACIC BONES AND SOFT TISSUES: No acute bone or soft tissue abnormality. ABDOMEN AND PELVIS:  LIVER: The liver is unremarkable. GALLBLADDER AND BILE DUCTS: Gallbladder is unremarkable. No biliary ductal dilatation. SPLEEN: The spleen is unremarkable. PANCREAS: The pancreas is unremarkable. ADRENAL GLANDS: Bilateral adrenal glands demonstrate no acute abnormality. KIDNEYS, URETERS AND BLADDER: No stones in the kidneys or ureters. No hydronephrosis. No perinephric or periureteral stranding. Urinary bladder is unremarkable. GI AND BOWEL: Stomach and duodenal sweep demonstrate no acute abnormality. There are numerous colonic diverticula involving the descending colon and there is questionable mild soft tissue stranding. The small bowel and appendix are unremarkable. There is no bowel obstruction. No abnormal bowel wall thickening or distension. REPRODUCTIVE: The patient is status post hysterectomy and bilateral salpingo-oophorectomy. PERITONEUM AND RETROPERITONEUM: No ascites or free air. LYMPH NODES: No lymphadenopathy. ABDOMINAL BONES  AND SOFT TISSUES: There are degenerative changes present throughout the lumbar spine. No acute soft tissue abnormality. IMPRESSION: 1. No evidence of acute aortic syndrome. 2. Moderate calcific coronary artery disease. 3. 2 to 3 mm solid pulmonary nodule in the right lower lobe; for incidental nodule 05 mm without specified high-risk status, no routine follow-up imaging is recommended per Fleischner Society Guidelines. 4. Numerous descending colonic diverticula with questionable mild pericolic soft tissue stranding. Electronically signed by: Evalene Coho MD 08/06/2024 04:31 AM EST RP Workstation: [id number]   CT Head Wo Contrast Result Date: 08/06/2024 EXAM: CT HEAD WITHOUT CONTRAST 08/06/2024 04:00:29 AM TECHNIQUE: CT of the head was performed without the administration of intravenous contrast. Automated exposure control, iterative reconstruction, and/or weight based adjustment of the mA/kV was utilized to reduce the radiation dose to as low as reasonably achievable.  COMPARISON: MRI brain 05/20/2021 and head CT 01/25/2016. CLINICAL HISTORY: Mental status change, unknown cause. FINDINGS: BRAIN AND VENTRICLES: There is metallic artifact partially obscuring visualization of the right occipital lobe due to an overlying ponytail holder. There is mild cerebral atrophy and small vessel disease. The cerebellum and brainstem are unremarkable. No acute hemorrhage. No cortical-based acute infarct. No mass effect or midline shift. No hydrocephalus. No extra-axial collection. Basal cisterns are clear. There are patchy calcifications in the carotid siphons, with no hyperdense vessels. There is a partially empty sella. ORBITS: There have been interval lens replacements. No acute abnormality. SINUSES: There is left-sided deviation and spurring of the nasal septum. No acute abnormality. SOFT TISSUES AND SKULL: No acute soft tissue abnormality. No skull fracture. IMPRESSION: 1. No acute intracranial  CT findings. stable exam. 2. Chronic changes. 3. Partially empty sella. Electronically signed by: Francis Quam MD 08/06/2024 04:18 AM EST RP Workstation: [id number]   DG Chest Port 1 View Result Date: 08/06/2024 EXAM: 1 VIEW(S) XRAY OF THE CHEST 08/06/2024 03:57:00 AM COMPARISON: 05/24/2024 CLINICAL HISTORY: chest pain FINDINGS: LUNGS AND PLEURA: No focal pulmonary opacity. No pleural effusion. No pneumothorax. HEART AND MEDIASTINUM: Atherosclerotic plaque noted. No acute abnormality of the cardiac and mediastinal silhouettes. BONES AND SOFT TISSUES: No acute osseous abnormality. IMPRESSION: 1. No acute cardiopulmonary findings. Electronically signed by: Oneil Devonshire MD 08/06/2024 04:01 AM EST RP Workstation: [id number]     Procedures   Medications Ordered in the ED  pantoprazole  (PROTONIX ) injection 40 mg (40 mg Intravenous Given 08/06/24 0413)  ondansetron  (ZOFRAN ) injection 4 mg (4 mg Intravenous Given 08/06/24 0356)  sodium chloride  0.9 % bolus 1,000 mL (0 mLs Intravenous Stopped  08/06/24 0526)  iohexol  (OMNIPAQUE ) 350 MG/ML injection 100 mL (100 mLs Intravenous Contrast Given 08/06/24 0404)  metoCLOPramide  (REGLAN ) injection 10 mg (10 mg Intravenous Given 08/06/24 0609)                                    Medical Decision Making Amount and/or Complexity of Data Reviewed Labs: ordered. Radiology: ordered.  Risk Prescription drug management. Decision regarding hospitalization.   Patient with history of diabetes here for evaluation of chest pain.  Patient is abruptly confused on assessment in the emergency department without focal neurologic deficits.  Patient ill-appearing on initial assessment with diaphoresis, pallor and tachycardia, these did improve but her confusion persisted.  CT head is negative for acute abnormality.  Given chest pain and abdominal pain a CTA was obtained, which is negative for PE, dissection, pancreatitis.  Labs with leukocytosis but there is no evidence of acute  infectious process at this time.  Given patient's concerning symptoms as well as acute confusion medicine consulted for admission for ongoing workup and management.  Patient and son updated at bedside of findings of studies and need for admission and they are in agreement with plan.     Final diagnoses:  Precordial pain  Disorientation  Leukocytosis, unspecified type    ED Discharge Orders     None          Griselda Norris, MD 08/06/24 [phone]

## 2024-08-06 NOTE — ED Provider Notes (Signed)
  Physical Exam  BP (!) 160/74   Pulse 96   Temp (!) 97.4 F (36.3 C) (Oral)   Resp 16   SpO2 100%   Physical Exam  Procedures  Procedures  ED Course / MDM    Medical Decision Making Amount and/or Complexity of Data Reviewed Labs: ordered. Radiology: ordered.  Risk Prescription drug management. Decision regarding hospitalization.   35F presenting with CP, acutely altered, abd pain, diarrhea, initial trop normal. Pending admission.   MRI brain was normal, patient appears to be alert and oriented x 3 on my evaluation, GCS 15, workup significant for leukocytosis, endorsing persistent nausea.  Administered sublingual Ativan  as she had previously been administered Reglan  and Zofran .  Patient rehydrated, still persistently tachycardic.  Admission previously had been requested, plan for rehydration in the setting of persistent nausea vomiting and diarrhea in the setting of recent GLP-1 initiation.C diff and GIP testing also ordered. Medicine had been consulted for admission for rehydration, Dr. Leotis accepting.   Jerrol Agent, MD 08/06/24 [phone]

## 2024-08-06 NOTE — ED Notes (Signed)
 CCMD called.

## 2024-08-06 NOTE — ED Triage Notes (Signed)
 Pt BIB [facility]; pt was started on GLP1 x 1 week ago and has had intermittent nausea/vomiting/diarrhea since. Pt woke up approx 2 hours ago with sudden onset of chest pain. Pt was given ASA 324mg , NTG SL x2 and Zofran  8mg  ODT. Pain was 5/10on EMS arrival and decreased to 3/10 after medications. Pt is pale and clammy and states that she feels faint when she stands

## 2024-08-06 NOTE — H&P (Addendum)
 History and Physical    Allison Yoder FMW:[id number] DOB: [birth year] DOA: 08/06/2024  PCP: Cleatus Arlyss RAMAN, MD   Patient coming from: Home  I have personally briefly reviewed patient's old medical records in [facility].  Chief Complaint: Intractable Nausea and Vomiting following GLP1 injection.  HPI: Allison Yoder is a 73 y.o. female with PMH significant for diabetes mellitus type 2, hypertension, hyperlipidemia, GERD, anxiety, depression presented in the ED with complaints of intractable nausea and vomiting.  Patient is enrolled in [employer] clinical research . Patient reports after she has received first dose of study drug injection  (GLP-1/ GIP combo) 4 days back , She started nausea, vomiting and diarrhea which was initially mild but later became severe.  She has thrown up several times,  denies any blood in the vomitus and stool.  She feels weak and tired and fatigued.  Today she woke up with left-sided chest pain associated with shortness of breath with exertion.  She decided to come to the ED.  ED Course: She was tachycardic hypertensive other vitals were stable. Temp 97.4, HR 104, RR 21, BP 139/80, SpO2 100% on room air. Labs include sodium 141, potassium 4.6, chloride 105, bicarb 21, glucose 251, BUN 12, creatinine 0.95, calcium  9.9, anion gap 15, alkaline phosphatase 85, albumin 4.2, lipase 40, AST 39, ALT 39, total protein 7.0, ammonia less than 13, total bilirubin 1.3, troponin 8> 8 WBC 19.0, hemoglobin 15.5, hematocrit 45.8, platelet 232, UA ketones 20, other UA negative., CT head with no acute intracranial abnormality, Partially empty sella.  CT angio chest abdomen and pelvis > no evidence of acute aortic syndrome, 2 to 3 mm solid pulmonary nodule in the right lower lobe incidental.  Numerous colonic diverticula.  Review of Systems:     Review of Systems  Constitutional:  Positive for malaise/fatigue.  HENT: Negative.    Eyes: Negative.   Respiratory:  Positive for  shortness of breath.   Cardiovascular:  Positive for chest pain.  Gastrointestinal:  Positive for abdominal pain, diarrhea, nausea and vomiting.  Genitourinary: Negative.   Musculoskeletal: Negative.   Skin: Negative.   Neurological: Negative.   Endo/Heme/Allergies: Negative.   Psychiatric/Behavioral:  Positive for depression. The patient is nervous/anxious.      Past Medical History:  Diagnosis Date   Allergy    Anemia    all the time as a child   Anxiety    Arthritis    all my joints; worse in my hands (10/27/2015)   Chronic lower back pain    Depression    Diverticulosis    Fatty liver    GERD (gastroesophageal reflux disease)    Grade I diastolic dysfunction    Heart murmur    dx'd years ago; very mild; never treated (10/27/2015)   Hyperlipidemia    Hypertension    Hypothyroidism 2002-~ 2010   Iritis    per [facility]   Migraines    stopped when I went thru menopause   NASH (nonalcoholic steatohepatitis)    OSA on CPAP    Pneumonia ~ 2013; 10/27/2015   Recurrent urinary tract infection    Routine general medical examination at a health care facility 02/26/2014   Snores    Stroke (HCC) 2017   TIAs x2.  No deficits   Type II diabetes mellitus (HCC)    lost weight; started exercising again; don't have it anymore (10/27/2015)   Urinary incontinence    Wears glasses    Wears hearing aid  in both ears     Past Surgical History:  Procedure Laterality Date   BLEPHAROPLASTY Bilateral 2013; 2016   BREAST LUMPECTOMY Right 1964   benign tumor,   CATARACT EXTRACTION W/PHACO Right 01/31/2023   Procedure: CATARACT EXTRACTION PHACO AND INTRAOCULAR LENS PLACEMENT (IOC) RIGHT DIABETIC  5.04  00:36.2;  Surgeon: Jaye Fallow, MD;  Location: [facility];  Service: Ophthalmology;  Laterality: Right;  Diabetic   CATARACT EXTRACTION W/PHACO Left 02/14/2023   Procedure: CATARACT EXTRACTION PHACO AND INTRAOCULAR LENS PLACEMENT (IOC) LEFT DIABETIC  5.73   00:40.1;  Surgeon: Jaye Fallow, MD;  Location: [facility];  Service: Ophthalmology;  Laterality: Left;  Diabetic   HEMORRHOID BANDING  X 2   I & D EXTREMITY Left 07/05/2023   Procedure: IRRIGATION AND DEBRIDEMENT OF LEFT FOURTH FINGER;  Surgeon: Arlinda Buster, MD;  Location: [facility] OR;  Service: Orthopedics;  Laterality: Left;   KNEE ARTHROSCOPY Left 2016   meniscus repair   PELVIC FLOOR REPAIR  2001   lift   POLYPECTOMY  X 3   bladder   SHOULDER ARTHROSCOPY W/ ROTATOR CUFF REPAIR Left 2013   SHOULDER ARTHROSCOPY W/ ROTATOR CUFF REPAIR Right 2015   TUBAL LIGATION  ~ 1986   VAGINAL HYSTERECTOMY  1992   Partial     reports that she quit smoking about 38 years ago. Her smoking use included cigarettes. She started smoking about 43 years ago. She has a 10 pack-year smoking history. She has never used smokeless tobacco. She reports that she does not currently use alcohol. She reports that she does not use drugs.  Allergies  Allergen Reactions   Codeine Itching and Rash   Inapsine [Droperidol] Shortness Of Breath, Anxiety, Palpitations and Hypertension    Elevated HR Panic attacks   Keflex [Cephalexin] Itching   Crestor  [Rosuvastatin ] Other (See Comments)    Aches   Glucophage  [Metformin ] Other (See Comments)    GI intolerance  Currently taking 500mg  XR, 1000mg  daily dose with no reported side effects   Lipitor [Atorvastatin  Calcium ] Other (See Comments)    Myalgias    Niaspan [Niacin] Other (See Comments)    Flushing    Pravastatin Other (See Comments)    Myalgias   Adhesive [Tape] Rash and Other (See Comments)    Blisters   Latex Rash    Latex gloves   Wound Dressing Adhesive Rash    Family History  Problem Relation Age of Onset   Arthritis Mother    Heart disease Mother    Hyperlipidemia Mother    Hypertension Mother    Diabetes Mother    Depression Mother    Stroke Mother    Alcohol abuse Father    Lung cancer Father    Arthritis Maternal  Grandmother    Stroke Maternal Grandmother    Diabetes Maternal Grandmother    Heart disease Paternal Uncle        x 2   Arthritis Sister    Breast cancer Cousin    Heart disease Paternal Aunt    Diabetes Paternal Uncle    Diabetes Sister    Heart disease Paternal Uncle        x 5   Heart disease Paternal Aunt        x 3   Stroke Paternal Aunt    Dementia Paternal Grandmother    Colon cancer Neg Hx    Family history reviewed and not pertinent.  Prior to Admission medications   Medication Sig Start Date End Date  Taking? Authorizing Provider  albuterol  (VENTOLIN  HFA) 108 (90 Base) MCG/ACT inhaler INHALE 1 TO 2 PUFFS INTO THE LUNGS EVERY 6 HOURS AS NEEDED FOR WHEEZING OR SHORTNESS OF BREATH 01/19/22   Dugal, Tabitha, FNP  betamethasone  valerate ointment (VALISONE ) 0.1 % Apply 1 Application topically daily. 05/12/23   [provider]  citalopram  (CELEXA ) 20 MG tablet Take 1.5 tablets (30 mg total) by mouth daily. 04/08/24   Cleatus Arlyss RAMAN, MD  clonazePAM  (KLONOPIN ) 0.5 MG tablet Take 1 tablet (0.5 mg total) by mouth daily as needed for anxiety. 11/16/21   Cleatus Arlyss RAMAN, MD  cyclobenzaprine  (FLEXERIL ) 10 MG tablet Take 1 tablet (10 mg total) by mouth daily as needed for muscle spasms. 09/03/19   Cleatus Arlyss RAMAN, MD  glucose blood (ONETOUCH ULTRA) test strip as directed to check blood sugar daily. Dx E11.9 11/10/22   Cleatus Arlyss RAMAN, MD  insulin  glargine (LANTUS  SOLOSTAR) 100 UNIT/ML Solostar Pen Inject 60-62 Units into the skin daily. 07/25/24   Cleatus Arlyss RAMAN, MD  Insulin  Pen Needle (BD PEN NEEDLE NANO 2ND GEN) 32G X 4 MM MISC USE WITH INSULIN  PEN EVERY DAY AS DIRECTED. 02/22/24   Cleatus Arlyss RAMAN, MD  Lancets [facility] DELICA PLUS [place]) MISC USE ONCE DAILY AS DIRECTED 11/15/22   Cleatus Arlyss RAMAN, MD  lisinopril  (ZESTRIL ) 40 MG tablet Take 1 tablet (40 mg total) by mouth daily. 09/26/23   Cleatus Arlyss RAMAN, MD  metFORMIN  (GLUCOPHAGE -XR) 500 MG 24 hr tablet Take 1 tablet (500  mg total) by mouth 2 (two) times daily with a meal. 06/07/24   Cleatus Arlyss RAMAN, MD  metoprolol  succinate (TOPROL -XL) 50 MG 24 hr tablet TAKE 1 TABLET BY MOUTH  DAILY WITH OR IMMEDIATELY  FOLLOWING A MEAL 09/26/23   Cleatus Arlyss RAMAN, MD  Multiple Vitamin (MULTIVITAMIN) tablet Take 1 tablet by mouth daily.    [provider]  ondansetron  (ZOFRAN -ODT) 4 MG disintegrating tablet Take 1 tablet (4 mg total) by mouth every 8 (eight) hours as needed for nausea. 07/30/24   Bedsole, Amy E, MD  pantoprazole  (PROTONIX ) 40 MG tablet Take 1 tablet (40 mg total) by mouth daily. 09/26/23   Cleatus Arlyss RAMAN, MD  REPATHA  SURECLICK 140 MG/ML SOAJ ADMINISTER 1 ML UNDER THE SKIN EVERY 14 DAYS 08/09/23   Delford Maude BROCKS, MD  Vitamin D , Cholecalciferol, 1000 units CAPS Take 3,000 Units by mouth daily.    [provider]    Physical Exam: Vitals:   08/06/24 1300 08/06/24 1400 08/06/24 1441 08/06/24 1452  BP: 126/79 129/80    Pulse: 95 90    Resp: 16 14    Temp:    98.9 F (37.2 C)  TempSrc:   Oral Oral  SpO2: 96% 97%      Constitutional: Appears calm and comfortable, not in any acute distress. Vitals:   08/06/24 1300 08/06/24 1400 08/06/24 1441 08/06/24 1452  BP: 126/79 129/80    Pulse: 95 90    Resp: 16 14    Temp:    98.9 F (37.2 C)  TempSrc:   Oral Oral  SpO2: 96% 97%     Eyes: PERRL, lids and conjunctivae normal ENMT: Mucous membranes are moist. Posterior pharynx clear of any exudate or lesions. Neck: normal, supple, no masses, no thyromegaly. Respiratory: CTA bilaterally, no wheezing, no crackles. Normal respiratory effort. No accessory muscle use.  Cardiovascular: S1+ S2 heard,  Regular rate and rhythm, no murmurs / rubs / gallops. No extremity edema. Abdomen:Soft,  no  tenderness, no masses palpated. No hepatosplenomegaly. Bowel sounds positive.  Musculoskeletal: No joint deformity upper and lower extremities. Good ROM, no contractures. Normal muscle tone.  Skin: no rashes,  lesions, ulcers. No induration Neurologic: CN 2-12 grossly intact. Sensation intact, DTR normal. Strength 5/5 in all 4.  Psychiatric: Normal judgment and insight. Alert and oriented x 3. Normal mood.    Labs on Admission: I have personally reviewed following labs and imaging studies  CBC: Recent Labs  Lab 07/30/24 1618 08/06/24 0314 08/06/24 0321 08/06/24 0806  WBC 12.6* 19.0*  --   --   NEUTROABS 7,963*  --   --   --   HGB 14.4 15.5* 15.6* 14.3  HCT 42.7 45.8 46.0 42.0  MCV 90.3 89.6  --   --   PLT 218 231  --   --    Basic Metabolic Panel: Recent Labs  Lab 07/30/24 1618 08/06/24 0314 08/06/24 0321 08/06/24 0806  NA 137 141 140 141  K 4.0 4.6 4.5 3.9  CL 102 105 103  --   CO2 28 21*  --   --   GLUCOSE 115* 251* 179*  --   BUN 18 12 15   --   CREATININE 0.86 9.04 1.00  --   CALCIUM  9.6 9.9  --   --    GFR: Estimated Creatinine Clearance: 54.2 mL/min (by C-G formula based on SCr of 1 mg/dL). Liver Function Tests: Recent Labs  Lab 07/30/24 1618 08/06/24 0314  AST 30 39  ALT 39* 39  ALKPHOS  --  85  BILITOT 0.9 1.3*  PROT 6.8 7.0  ALBUMIN  --  4.2   Recent Labs  Lab 07/30/24 1618 08/06/24 0314  LIPASE 17 40   Recent Labs  Lab 08/06/24 0800  AMMONIA <13   Coagulation Profile: No results for input(s): INR, PROTIME in the last 168 hours. Cardiac Enzymes: No results for input(s): CKTOTAL, CKMB, CKMBINDEX, TROPONINI in the last 168 hours. BNP (last 3 results) Recent Labs    01/02/24 1200  PROBNP 61.0   HbA1C: No results for input(s): HGBA1C in the last 72 hours. CBG: Recent Labs  Lab 08/06/24 0311  GLUCAP 179*   Lipid Profile: No results for input(s): CHOL, HDL, LDLCALC, TRIG, CHOLHDL, LDLDIRECT in the last 72 hours. Thyroid  Function Tests: No results for input(s): TSH, T4TOTAL, FREET4, T3FREE, THYROIDAB in the last 72 hours. Anemia Panel: No results for input(s): VITAMINB12, FOLATE, FERRITIN,  TIBC, IRON, RETICCTPCT in the last 72 hours. Urine analysis:    Component Value Date/Time   COLORURINE YELLOW 08/06/2024 0641   APPEARANCEUR CLEAR 08/06/2024 0641   LABSPEC 1.039 (H) 08/06/2024 0641   PHURINE 6.0 08/06/2024 0641   GLUCOSEU NEGATIVE 08/06/2024 0641   GLUCOSEU 100 (A) 11/17/2022 1610   HGBUR NEGATIVE 08/06/2024 0641   BILIRUBINUR NEGATIVE 08/06/2024 0641   BILIRUBINUR neg 06/07/2021 1210   KETONESUR 20 (A) 08/06/2024 0641   PROTEINUR NEGATIVE 08/06/2024 0641   UROBILINOGEN 0.2 11/17/2022 1610   NITRITE NEGATIVE 08/06/2024 0641   LEUKOCYTESUR NEGATIVE 08/06/2024 0641    Radiological Exams on Admission: MR BRAIN WO CONTRAST Result Date: 08/06/2024 EXAM: MRI BRAIN WITHOUT CONTRAST 08/06/2024 05:45:37 AM TECHNIQUE: Multiplanar multisequence MRI of the head/brain was performed without the administration of intravenous contrast. COMPARISON: MRI of the head dated 05/20/2021. CLINICAL HISTORY: Mental status change, unknown cause. FINDINGS: BRAIN AND VENTRICLES: No acute infarct. No intracranial hemorrhage. No mass. No midline shift. No hydrocephalus. Mild periventricular and subcortical white matter disease present. The sella  is unremarkable. Normal flow voids. ORBITS: No acute abnormality. SINUSES AND MASTOIDS: No acute abnormality. BONES AND SOFT TISSUES: Normal marrow signal. No acute soft tissue abnormality. IMPRESSION: 1. No acute intracranial abnormality. 2. Mild periventricular and subcortical white matter disease. Electronically signed by: Evalene Coho MD 08/06/2024 05:50 AM EST RP Workstation: [id number]   CT Angio Chest/Abd/Pel for Dissection W and/or W/WO Result Date: 08/06/2024 EXAM: CTA CHEST, ABDOMEN AND PELVIS WITHOUT AND WITH CONTRAST 08/06/2024 04:00:29 AM TECHNIQUE: CTA of the chest was performed without and with the administration of intravenous contrast. CTA of the abdomen and pelvis was performed without and with the administration of intravenous  contrast. 100 mL of iohexol  (OMNIPAQUE ) 350 MG/ML injection was administered. Multiplanar reformatted images are provided for review. MIP images are provided for review. Automated exposure control, iterative reconstruction, and/or weight based adjustment of the mA/kV was utilized to reduce the radiation dose to as low as reasonably achievable. COMPARISON: CT of the abdomen and pelvis dated 05/14/2023. CLINICAL HISTORY: Acute aortic syndrome (AAS) suspected. FINDINGS: VASCULATURE: AORTA: The thoracic aorta is normal in caliber and demonstrates mild-to-moderate calcific atheromatous disease. There is no evidence of acute aortic syndrome. The abdominal aorta demonstrates moderate calcific atheromatous disease. No abdominal aortic aneurysm. No dissection. PULMONARY ARTERIES: The pulmonary arteries are normal in caliber and appear widely patent. No pulmonary embolism with the limits of this exam. GREAT VESSELS OF AORTIC ARCH: No acute finding. No dissection. No arterial occlusion or significant stenosis. CELIAC TRUNK: No acute finding. No occlusion or significant stenosis. SUPERIOR MESENTERIC ARTERY: Widely patent. No acute finding. No occlusion or significant stenosis. INFERIOR MESENTERIC ARTERY: Widely patent. No acute finding. No occlusion or significant stenosis. RENAL ARTERIES: Widely patent. No acute finding. No occlusion or significant stenosis. ILIAC ARTERIES: No acute finding. No occlusion or significant stenosis. CHEST: MEDIASTINUM: No mediastinal lymphadenopathy. The heart and pericardium demonstrate no acute abnormality. There is moderate calcific coronary artery disease present. LUNGS AND PLEURA: There is a 2 to 3 mm nodule present laterally within the right lower lobe on image 80 of series 9. There is minimal dependent atelectasis. No focal consolidation or pulmonary edema. No evidence of pleural effusion or pneumothorax. THORACIC BONES AND SOFT TISSUES: No acute bone or soft tissue abnormality. ABDOMEN AND  PELVIS: LIVER: The liver is unremarkable. GALLBLADDER AND BILE DUCTS: Gallbladder is unremarkable. No biliary ductal dilatation. SPLEEN: The spleen is unremarkable. PANCREAS: The pancreas is unremarkable. ADRENAL GLANDS: Bilateral adrenal glands demonstrate no acute abnormality. KIDNEYS, URETERS AND BLADDER: No stones in the kidneys or ureters. No hydronephrosis. No perinephric or periureteral stranding. Urinary bladder is unremarkable. GI AND BOWEL: Stomach and duodenal sweep demonstrate no acute abnormality. There are numerous colonic diverticula involving the descending colon and there is questionable mild soft tissue stranding. The small bowel and appendix are unremarkable. There is no bowel obstruction. No abnormal bowel wall thickening or distension. REPRODUCTIVE: The patient is status post hysterectomy and bilateral salpingo-oophorectomy. PERITONEUM AND RETROPERITONEUM: No ascites or free air. LYMPH NODES: No lymphadenopathy. ABDOMINAL BONES AND SOFT TISSUES: There are degenerative changes present throughout the lumbar spine. No acute soft tissue abnormality. IMPRESSION: 1. No evidence of acute aortic syndrome. 2. Moderate calcific coronary artery disease. 3. 2 to 3 mm solid pulmonary nodule in the right lower lobe; for incidental nodule 05 mm without specified high-risk status, no routine follow-up imaging is recommended per Fleischner Society Guidelines. 4. Numerous descending colonic diverticula with questionable mild pericolic soft tissue stranding. Electronically signed by: Evalene Coho MD 08/06/2024 04:31  AM EST RP Workstation: [id number]   CT Head Wo Contrast Result Date: 08/06/2024 EXAM: CT HEAD WITHOUT CONTRAST 08/06/2024 04:00:29 AM TECHNIQUE: CT of the head was performed without the administration of intravenous contrast. Automated exposure control, iterative reconstruction, and/or weight based adjustment of the mA/kV was utilized to reduce the radiation dose to as low as reasonably  achievable. COMPARISON: MRI brain 05/20/2021 and head CT 01/25/2016. CLINICAL HISTORY: Mental status change, unknown cause. FINDINGS: BRAIN AND VENTRICLES: There is metallic artifact partially obscuring visualization of the right occipital lobe due to an overlying ponytail holder. There is mild cerebral atrophy and small vessel disease. The cerebellum and brainstem are unremarkable. No acute hemorrhage. No cortical-based acute infarct. No mass effect or midline shift. No hydrocephalus. No extra-axial collection. Basal cisterns are clear. There are patchy calcifications in the carotid siphons, with no hyperdense vessels. There is a partially empty sella. ORBITS: There have been interval lens replacements. No acute abnormality. SINUSES: There is left-sided deviation and spurring of the nasal septum. No acute abnormality. SOFT TISSUES AND SKULL: No acute soft tissue abnormality. No skull fracture. IMPRESSION: 1. No acute intracranial  CT findings. stable exam. 2. Chronic changes. 3. Partially empty sella. Electronically signed by: Francis Quam MD 08/06/2024 04:18 AM EST RP Workstation: [id number]   DG Chest Port 1 View Result Date: 08/06/2024 EXAM: 1 VIEW(S) XRAY OF THE CHEST 08/06/2024 03:57:00 AM COMPARISON: 05/24/2024 CLINICAL HISTORY: chest pain FINDINGS: LUNGS AND PLEURA: No focal pulmonary opacity. No pleural effusion. No pneumothorax. HEART AND MEDIASTINUM: Atherosclerotic plaque noted. No acute abnormality of the cardiac and mediastinal silhouettes. BONES AND SOFT TISSUES: No acute osseous abnormality. IMPRESSION: 1. No acute cardiopulmonary findings. Electronically signed by: Oneil Devonshire MD 08/06/2024 04:01 AM EST RP Workstation: [id number]    EKG: Independently reviewed. Sinus tachycardia Probable left atrial enlargement.   Assessment/Plan Principal Problem:   Intractable nausea and vomiting Active Problems:   Essential hypertension   Anxiety and depression   MDD (major depressive  disorder)   OA (osteoarthritis)   Hyperlipidemia associated with type 2 diabetes mellitus (HCC)   Diabetes mellitus without complication (HCC)  Intractable Nausea, vomiting and diarrhea: Dehydration: Likely secondary to GLP 1 injection. Patient is enrolled in a research clinical study, has received first injection of GLP-1 4 days back. Patient has developed nausea, vomiting and diarrhea. She feels weak,  tired and fatigued. Likely caused dehydration. UA  > ketones +  otherwise unremarkable. Continue IV fluid resuscitation. Continue IV Zofran  as needed for nausea and vomiting. She has leukocytosis likely reactive. Obtain stool for C. difficile and GI panel.  Left-sided chest pain with shortness of breath: Patient reported having left-sided chest pain,  shortness of breath. EKG shows sinus tachycardia with left atrial enlargement, Patient has received aspirin  and sublingual nitro. CTA chest ruled out aortic dissection, pulmonary embolism. Troponin 8 > 8 > not consistent with ACS. It appears musculoskeletal pain.  Acute encephalopathy likely due to dehydration. Patient was confused when arrived in the ED. She seems much improved and back to baseline after getting IV hydration. CT head ruled out acute abnormality. MRI brain no acute intracranial abnormality.  Mild periventricular subcortical white matter disease.  Essential hypertension: Continue lisinopril  40 mg daily and metoprolol  50 mg dialy  Anxiety/depression: Continue Celexa  and clonazepam .  Hyperlipidemia: Resume Repatha .  Diabetes mellitus with hyperglycemia: Hold p.o. diabetic medications. Patient takes Lantus  60 units daily at home. Start Semglee  40 units daily and adjust accordingly. Start  sliding scale.  DVT prophylaxis: Lovenox  Code  Status: Full code Family Communication: No family at bedside. Disposition Plan:   Status is: Inpatient Remains inpatient appropriate because: Admitted for intractable nausea,  vomiting, diarrhea likely in the setting of GLP injection. Patient has mild leukocytosis, No other source of infection.  we will check C. difficile and GI panel .   Consults called:  None Admission status: Inpatient   Darcel Dawley MD [facility]   If 7PM-7AM, please contact night-coverage  08/06/2024, 3:15 PM

## 2024-08-06 NOTE — Progress Notes (Signed)
   08/06/24 2022  BiPAP/CPAP/SIPAP  BiPAP/CPAP/SIPAP Pt Type Adult  BiPAP/CPAP/SIPAP Resmed  Mask Type Nasal mask  Mask Size Small  Respiratory Rate 15 breaths/min  EPAP 10 cmH2O  Patient Home Machine No  Patient Home Mask No  Patient Home Tubing No  Auto Titrate No  BiPAP/CPAP /SiPAP Vitals  Pulse Rate 89  Resp 15  SpO2 95 %  Bilateral Breath Sounds Clear  MEWS Score/Color  MEWS Score 0  MEWS Score Color Landy

## 2024-08-07 ENCOUNTER — Inpatient Hospital Stay (HOSPITAL_COMMUNITY)

## 2024-08-07 DIAGNOSIS — R079 Chest pain, unspecified: Secondary | ICD-10-CM

## 2024-08-07 DIAGNOSIS — E119 Type 2 diabetes mellitus without complications: Secondary | ICD-10-CM

## 2024-08-07 DIAGNOSIS — F32A Depression, unspecified: Secondary | ICD-10-CM

## 2024-08-07 DIAGNOSIS — F419 Anxiety disorder, unspecified: Secondary | ICD-10-CM

## 2024-08-07 DIAGNOSIS — I10 Essential (primary) hypertension: Secondary | ICD-10-CM | POA: Diagnosis not present

## 2024-08-07 DIAGNOSIS — R112 Nausea with vomiting, unspecified: Secondary | ICD-10-CM | POA: Diagnosis not present

## 2024-08-07 DIAGNOSIS — R0602 Shortness of breath: Secondary | ICD-10-CM

## 2024-08-07 DIAGNOSIS — R609 Edema, unspecified: Secondary | ICD-10-CM

## 2024-08-07 LAB — ECHOCARDIOGRAM COMPLETE
AR max vel: 2.66 cm2
AV Area VTI: 3.16 cm2
AV Area mean vel: 2.51 cm2
AV Mean grad: 3 mmHg
AV Peak grad: 5.3 mmHg
Ao pk vel: 1.15 m/s
Area-P 1/2: 5.34 cm2
Calc EF: 70.3 %
Height: 65.5 in
MV VTI: 3.56 cm2
S' Lateral: 2.3 cm
Single Plane A2C EF: 78.1 %
Single Plane A4C EF: 67.1 %
Weight: 2880 [oz_av]

## 2024-08-07 LAB — COMPREHENSIVE METABOLIC PANEL WITH GFR
ALT: 31 U/L (ref 0–44)
AST: 26 U/L (ref 15–41)
Albumin: 3.2 g/dL — ABNORMAL LOW (ref 3.5–5.0)
Alkaline Phosphatase: 62 U/L (ref 38–126)
Anion gap: 9 (ref 5–15)
BUN: 11 mg/dL (ref 8–23)
CO2: 22 mmol/L (ref 22–32)
Calcium: 8.3 mg/dL — ABNORMAL LOW (ref 8.9–10.3)
Chloride: 109 mmol/L (ref 98–111)
Creatinine, Ser: 0.79 mg/dL (ref 0.44–1.00)
GFR, Estimated: >60 mL/min (ref >60)
Glucose, Bld: 92 mg/dL (ref 70–99)
Potassium: 3.6 mmol/L (ref 3.5–5.1)
Sodium: 140 mmol/L (ref 135–145)
Total Bilirubin: 1 mg/dL (ref 0.0–1.2)
Total Protein: 5.6 g/dL — ABNORMAL LOW (ref 6.5–8.1)

## 2024-08-07 LAB — CBC
HCT: 38 % (ref 36.0–46.0)
Hemoglobin: 12.5 g/dL (ref 12.0–15.0)
MCH: 30.3 pg (ref 26.0–34.0)
MCHC: 32.9 g/dL (ref 30.0–36.0)
MCV: 92.2 fL (ref 80.0–100.0)
Platelets: 159 K/uL (ref 150–400)
RBC: 4.12 MIL/uL (ref 3.87–5.11)
RDW: 13.7 % (ref 11.5–15.5)
WBC: 9.4 K/uL (ref 4.0–10.5)
nRBC: 0 % (ref 0.0–0.2)

## 2024-08-07 LAB — GLUCOSE, CAPILLARY
Glucose-Capillary: 173 mg/dL — ABNORMAL HIGH (ref 70–99)
Glucose-Capillary: 122 mg/dL — ABNORMAL HIGH (ref 70–99)
Glucose-Capillary: 126 mg/dL — ABNORMAL HIGH (ref 70–99)
Glucose-Capillary: 119 mg/dL — ABNORMAL HIGH (ref 70–99)

## 2024-08-07 LAB — PHOSPHORUS: Phosphorus: 3.5 mg/dL (ref 2.5–4.6)

## 2024-08-07 LAB — MAGNESIUM: Magnesium: 1.8 mg/dL (ref 1.7–2.4)

## 2024-08-07 MED ORDER — CYCLOBENZAPRINE HCL 5 MG PO TABS: 10.0000 mg | ORAL_TABLET | Freq: Every day | ORAL | Status: DC | PRN

## 2024-08-07 MED ORDER — BOOST / RESOURCE BREEZE PO LIQD CUSTOM
1.0000 | Freq: Three times a day (TID) | ORAL | Status: DC
  Administered 2024-08-07 (×2): 1 via ORAL

## 2024-08-07 MED ORDER — FLUTICASONE PROPIONATE 50 MCG/ACT NA SUSP
2.0000 | Freq: Every day | NASAL | Status: DC
  Administered 2024-08-07 – 2024-08-08 (×2): 2 via NASAL
  Filled 2024-08-07: qty 16

## 2024-08-07 NOTE — Care Management CC44 (Signed)
 Condition Code 44 Documentation Completed  Patient Details  Name: Allison Yoder MRN: [id number] Date of Birth: December 19, 1950   Condition Code 44 given:  Yes Patient signature on Condition Code 44 notice:  Yes Documentation of 2 MD's agreement:  Yes Code 44 added to claim:  Yes    [name], RN 08/07/2024, 11:28 AM

## 2024-08-07 NOTE — Care Management Obs Status (Signed)
 MEDICARE OBSERVATION STATUS NOTIFICATION   Patient Details  Name: Allison Yoder MRN: [id number] Date of Birth: 07/05/1951   Medicare Observation Status Notification Given:  Yes    [name], RN 08/07/2024, 11:28 AM

## 2024-08-07 NOTE — Evaluation (Signed)
 Physical Therapy Evaluation Patient Details Name: Allison Yoder MRN: [id number] DOB: 02/16/1951 Today's Date: 08/07/2024  History of Present Illness  73 y.o. female presents to [facility] 08/07/24 with N/V following GLP1 injection. MRI brain negative. Admitted with acute encephalopathy likely 2/2 dehydration. Also with L sided chest pain, likely MSK. PMHx:  diabetes mellitus type 2, hypertension, hyperlipidemia, GERD, anxiety, depression   Clinical Impression  PTA pt was independent for mobility with no AD and working from home. Pt presents close to mobility baseline being independent for bed mobility and independent to stand with no AD. While ambulating in the room, pt was independent with no AD. With increased distance, pt became fatigued and would reach for UE support from hallway rails. Pt had a small loss of balance, however, was able to correct with no physical assist. Unable to further assess balance as pt was fatigued and wanted to rest. Discussed follow-up PT with pt declining at this time. Will continue to follow acutely to further evaluate balance. Pt will have 24/7 assist available upon d/c home.         If plan is discharge home, recommend the following: Help with stairs or ramp for entrance;Assist for transportation;A little help with walking and/or transfers   Can travel by private vehicle   Yes     Equipment Recommendations None recommended by PT     Functional Status Assessment Patient has had a recent decline in their functional status and demonstrates the ability to make significant improvements in function in a reasonable and predictable amount of time.     Precautions / Restrictions Precautions Precautions: Fall Recall of Precautions/Restrictions: Intact Restrictions Weight Bearing Restrictions Per Provider Order: No      Mobility  Bed Mobility Overal bed mobility: Independent   Transfers Overall transfer level: Independent Equipment used: None      Ambulation/Gait Ambulation/Gait assistance: Supervision, Modified independent (Device/Increase time) Gait Distance (Feet): 200 Feet Assistive device: None Gait Pattern/deviations: Step-through pattern, Decreased stride length Gait velocity: decr    General Gait Details: ModI in the room with no AD. With increased gait distance, pt became fatigued and was reaching for UE support from hallway rail. One small loss of balance, however, pt was able to correct with side step and supervision    Balance Overall balance assessment: Mild deficits observed, not formally tested        Pertinent Vitals/Pain Pain Assessment Pain Assessment: No/denies pain    Home Living Family/patient expects to be discharged to:: Private residence Living Arrangements: Children (brother) Available Help at Discharge: Family;Available 24 hours/day Type of Home: House Home Access: Stairs to enter Entrance Stairs-Rails: Doctor, General Practice of Steps: 3   Home Layout: One level Home Equipment: Shower seat - built in Additional Comments: Can borrow SP cane from brother    Prior Function Prior Level of Function : Independent/Modified Independent;Driving;Working/employed    Mobility Comments: Ind with no AD ADLs Comments: works as psychologist, 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     Extremity/Trunk Assessment   Upper Extremity Assessment Upper Extremity Assessment: Overall WFL for tasks assessed    Lower Extremity Assessment Lower Extremity Assessment: Overall WFL for tasks assessed    Cervical / Trunk Assessment Cervical / Trunk Assessment: Normal  Communication   Communication Communication: No apparent difficulties    Cognition Arousal: Alert Behavior During Therapy: WFL for tasks assessed/performed   PT - Cognitive impairments: No apparent impairments    Following commands: Intact       Cueing Cueing Techniques:  Verbal cues     General Comments General comments (skin  integrity, edema, etc.): VSS on RA     PT Assessment Patient needs continued PT services  PT Problem List Decreased activity tolerance;Decreased balance;Decreased mobility       PT Treatment Interventions DME instruction;Gait training;Stair training;Functional mobility training;Therapeutic activities;Therapeutic exercise;Balance training;Neuromuscular re-education;Patient/family education    PT Goals (Current goals can be found in the Care Plan section)  Acute Rehab PT Goals Patient Stated Goal: to feel better and go home PT Goal Formulation: With patient Time For Goal Achievement: 08/21/24 Potential to Achieve Goals: Good    Frequency Min 1X/week        AM-PAC PT 6 Clicks Mobility  Outcome Measure Help needed turning from your back to your side while in a flat bed without using bedrails?: None Help needed moving from lying on your back to sitting on the side of a flat bed without using bedrails?: None Help needed moving to and from a bed to a chair (including a wheelchair)?: None Help needed standing up from a chair using your arms (e.g., wheelchair or bedside chair)?: None Help needed to walk in hospital room?: A Little Help needed climbing 3-5 steps with a railing? : A Little 6 Click Score: 22    End of Session   Activity Tolerance: Patient tolerated treatment well Patient left: in bed;with call bell/phone within reach Nurse Communication: Mobility status PT Visit Diagnosis: Unsteadiness on feet (R26.81);Other abnormalities of gait and mobility (R26.89)    Time: 1345-1401 PT Time Calculation (min) (ACUTE ONLY): 16 min   Charges:   PT Evaluation $PT Eval Low Complexity: 1 Low   PT General Charges $$ ACUTE PT VISIT: 1 Visit       Kate ORN, PT, DPT Secure Chat Preferred  Rehab Office [phone]   Kate BRAVO Wendolyn 08/07/2024, 2:54 PM

## 2024-08-07 NOTE — Progress Notes (Signed)
 PROGRESS NOTE        PATIENT DETAILS Name: Allison Yoder Age: 73 y.o. Sex: female Date of Birth: 12-20-1950 Admit Date: 08/06/2024 Admitting Physician Darcel Dawley, MD ERE:Ilwrjw, Arlyss RAMAN, MD  Brief Summary: Patient is a 73 y.o.  female with history of DM-2, HTN, HLD, anxiety/depression-who was recently enrolled in a clinical research trial-and received a first dose of study drug injection-following which she had multiple episodes of nausea, vomiting, diarrhea and some confusion.  She subsequently presented to the ED and was admitted to the hospitalist service.  Significant events: 11/26>> admit to [facility].  Significant studies: 11/25>> CXR: No PNA 11/25>> CT angio chest/abdomen/pelvis: No dissection, no central PE, 2-3 solid pulmonary nodule. 11/25>> MRI brain: No acute intracranial abnormality.  Significant microbiology data: None  Procedures: None  Consults: None  Subjective: Feels much better-completely awake/alert-answering questions appropriately.  No further nausea/vomiting or diarrhea.  No abdominal pain.  No chest pain.  Tolerating clear liquids.  Objective: Vitals: Blood pressure (!) 153/75, pulse 81, temperature 97.7 F (36.5 C), temperature source Axillary, resp. rate 19, height 5' 5.5 (1.664 m), weight 81.6 kg, SpO2 95%.   Exam: Gen Exam:Alert awake-not in any distress HEENT:atraumatic, normocephalic Chest: B/L clear to auscultation anteriorly CVS:S1S2 regular Abdomen:soft non tender, non distended Extremities:no edema Neurology: Non focal Skin: no rash  Pertinent Labs/Radiology:    Latest Ref Rng & Units 08/07/2024    2:30 AM 08/06/2024    8:06 AM 08/06/2024    3:21 AM  CBC  WBC 4.0 - 10.5 K/uL 9.4     Hemoglobin 12.0 - 15.0 g/dL 87.4  85.6  84.3   Hematocrit 36.0 - 46.0 % 38.0  42.0  46.0   Platelets 150 - 400 K/uL 159       Lab Results  Component Value Date   NA 140 08/07/2024   K 3.6 08/07/2024   CL 109  08/07/2024   CO2 22 08/07/2024      Assessment/Plan: Acute metabolic encephalopathy I suspect this was secondary to dehydration in the setting of nausea/vomiting/diarrhea Encephalopathy has completely resolved-she is awake/alert and back to baseline.  Intractable nausea/vomiting/diarrhea Probably secondary to research drug study (recently enrolled in a clinical trial) No major abnormality seen on CT imaging Her nausea/vomiting and diarrhea have completely resolved-she is tolerating clear liquids Stool studies never sent as no diarrhea. Advance diet today Continue supportive care And see how she does.  Left-sided chest pain Probably musculoskeletal Improved Troponins negative. CTA negative for central PE or dissection. Await echo/lower extremity Doppler Supportive care in the interim.  DM-2 (A1c 7.8 on 11/25) CBG stable Continue Semglee  40 units+ SSI Will slowly adjust insulin  regimen as she tolerates diet.  HTN BP stable Continue metoprolol /lisinopril   HLD Resume Repatha  postdischarge.  Anxiety/depression Appears stable Continue as needed Klonopin  and Celexa .  2-3 mm solid pulmonary nodule right lower lobe Incidental finding-since size less than 5 mm-radiology recommending no routine imaging follow-up as no high risk features.  BMI/Obesity/Underweight: Estimated body mass index is 29.5 kg/m as calculated from the following:   Height as of this encounter: 5' 5.5 (1.664 m).   Weight as of this encounter: 81.6 kg.   Code status:   Code Status: Full Code   DVT Prophylaxis: enoxaparin  (LOVENOX ) injection 40 mg Start: 08/06/24 1000   Family Communication: None at bedside   Disposition Plan: Status  is: Inpatient Remains inpatient appropriate because: Severity of illness   Planned Discharge Destination:Home   Diet: Diet Order             Diet clear liquid Room service appropriate? Yes; Fluid consistency: Thin  Diet effective now                      Antimicrobial agents: Anti-infectives (From admission, onward)    None        MEDICATIONS: Scheduled Meds:  citalopram   30 mg Oral Daily   docusate sodium   100 mg Oral BID   enoxaparin  (LOVENOX ) injection  40 mg Subcutaneous Q24H   feeding supplement  1 Container Oral TID BM   insulin  aspart  0-9 Units Subcutaneous TID WC   insulin  glargine-yfgn  40 Units Subcutaneous Q24H   lisinopril   40 mg Oral Daily   metoprolol  succinate  50 mg Oral Daily   pantoprazole  (PROTONIX ) IV  40 mg Intravenous Q24H   Continuous Infusions: PRN Meds:.acetaminophen  **OR** acetaminophen , clonazePAM , ondansetron  **OR** ondansetron  (ZOFRAN ) IV   I have personally reviewed following labs and imaging studies  LABORATORY DATA: CBC: Recent Labs  Lab 08/06/24 0314 08/06/24 0321 08/06/24 0806 08/07/24 0230  WBC 19.0*  --   --  9.4  HGB 15.5* 15.6* 14.3 12.5  HCT 45.8 46.0 42.0 38.0  MCV 89.6  --   --  92.2  PLT 231  --   --  159    Basic Metabolic Panel: Recent Labs  Lab 08/06/24 0314 08/06/24 0321 08/06/24 0806 08/07/24 0230  NA 141 140 141 140  K 4.6 4.5 3.9 3.6  CL 105 103  --  109  CO2 21*  --   --  22  GLUCOSE 251* 179*  --  92  BUN 12 15  --  11  CREATININE 0.95 1.00  --  0.79  CALCIUM  9.9  --   --  8.3*  MG  --   --   --  1.8  PHOS  --   --   --  3.5    GFR: Estimated Creatinine Clearance: 65.8 mL/min (by C-G formula based on SCr of 0.79 mg/dL).  Liver Function Tests: Recent Labs  Lab 08/06/24 0314 08/07/24 0230  AST 39 26  ALT 39 31  ALKPHOS 85 62  BILITOT 1.3* 1.0  PROT 7.0 5.6*  ALBUMIN 4.2 3.2*   Recent Labs  Lab 08/06/24 0314  LIPASE 40   Recent Labs  Lab 08/06/24 0800  AMMONIA <13    Coagulation Profile: No results for input(s): INR, PROTIME in the last 168 hours.  Cardiac Enzymes: No results for input(s): CKTOTAL, CKMB, CKMBINDEX, TROPONINI in the last 168 hours.  BNP (last 3 results) Recent Labs    01/02/24 1200  PROBNP  61.0    Lipid Profile: No results for input(s): CHOL, HDL, LDLCALC, TRIG, CHOLHDL, LDLDIRECT in the last 72 hours.  Thyroid  Function Tests: No results for input(s): TSH, T4TOTAL, FREET4, T3FREE, THYROIDAB in the last 72 hours.  Anemia Panel: No results for input(s): VITAMINB12, FOLATE, FERRITIN, TIBC, IRON, RETICCTPCT in the last 72 hours.  Urine analysis:    Component Value Date/Time   COLORURINE YELLOW 08/06/2024 0641   APPEARANCEUR CLEAR 08/06/2024 0641   LABSPEC 1.039 (H) 08/06/2024 0641   PHURINE 6.0 08/06/2024 0641   GLUCOSEU NEGATIVE 08/06/2024 0641   GLUCOSEU 100 (A) 11/17/2022 1610   HGBUR NEGATIVE 08/06/2024 0641   BILIRUBINUR NEGATIVE 08/06/2024 0641   BILIRUBINUR neg  06/07/2021 1210   KETONESUR 20 (A) 08/06/2024 0641   PROTEINUR NEGATIVE 08/06/2024 0641   UROBILINOGEN 0.2 11/17/2022 1610   NITRITE NEGATIVE 08/06/2024 0641   LEUKOCYTESUR NEGATIVE 08/06/2024 0641    Sepsis Labs: Lactic Acid, Venous    Component Value Date/Time   LATICACIDVEN 12.3 (HH) 08/06/2024 2023    MICROBIOLOGY: No results found for this or any previous visit (from the past 240 hours).  RADIOLOGY STUDIES/RESULTS: MR BRAIN WO CONTRAST Result Date: 08/06/2024 EXAM: MRI BRAIN WITHOUT CONTRAST 08/06/2024 05:45:37 AM TECHNIQUE: Multiplanar multisequence MRI of the head/brain was performed without the administration of intravenous contrast. COMPARISON: MRI of the head dated 05/20/2021. CLINICAL HISTORY: Mental status change, unknown cause. FINDINGS: BRAIN AND VENTRICLES: No acute infarct. No intracranial hemorrhage. No mass. No midline shift. No hydrocephalus. Mild periventricular and subcortical white matter disease present. The sella is unremarkable. Normal flow voids. ORBITS: No acute abnormality. SINUSES AND MASTOIDS: No acute abnormality. BONES AND SOFT TISSUES: Normal marrow signal. No acute soft tissue abnormality. IMPRESSION: 1. No acute intracranial  abnormality. 2. Mild periventricular and subcortical white matter disease. Electronically signed by: Evalene Coho MD 08/06/2024 05:50 AM EST RP Workstation: [id number]   CT Angio Chest/Abd/Pel for Dissection W and/or W/WO Result Date: 08/06/2024 EXAM: CTA CHEST, ABDOMEN AND PELVIS WITHOUT AND WITH CONTRAST 08/06/2024 04:00:29 AM TECHNIQUE: CTA of the chest was performed without and with the administration of intravenous contrast. CTA of the abdomen and pelvis was performed without and with the administration of intravenous contrast. 100 mL of iohexol  (OMNIPAQUE ) 350 MG/ML injection was administered. Multiplanar reformatted images are provided for review. MIP images are provided for review. Automated exposure control, iterative reconstruction, and/or weight based adjustment of the mA/kV was utilized to reduce the radiation dose to as low as reasonably achievable. COMPARISON: CT of the abdomen and pelvis dated 05/14/2023. CLINICAL HISTORY: Acute aortic syndrome (AAS) suspected. FINDINGS: VASCULATURE: AORTA: The thoracic aorta is normal in caliber and demonstrates mild-to-moderate calcific atheromatous disease. There is no evidence of acute aortic syndrome. The abdominal aorta demonstrates moderate calcific atheromatous disease. No abdominal aortic aneurysm. No dissection. PULMONARY ARTERIES: The pulmonary arteries are normal in caliber and appear widely patent. No pulmonary embolism with the limits of this exam. GREAT VESSELS OF AORTIC ARCH: No acute finding. No dissection. No arterial occlusion or significant stenosis. CELIAC TRUNK: No acute finding. No occlusion or significant stenosis. SUPERIOR MESENTERIC ARTERY: Widely patent. No acute finding. No occlusion or significant stenosis. INFERIOR MESENTERIC ARTERY: Widely patent. No acute finding. No occlusion or significant stenosis. RENAL ARTERIES: Widely patent. No acute finding. No occlusion or significant stenosis. ILIAC ARTERIES: No acute finding. No  occlusion or significant stenosis. CHEST: MEDIASTINUM: No mediastinal lymphadenopathy. The heart and pericardium demonstrate no acute abnormality. There is moderate calcific coronary artery disease present. LUNGS AND PLEURA: There is a 2 to 3 mm nodule present laterally within the right lower lobe on image 80 of series 9. There is minimal dependent atelectasis. No focal consolidation or pulmonary edema. No evidence of pleural effusion or pneumothorax. THORACIC BONES AND SOFT TISSUES: No acute bone or soft tissue abnormality. ABDOMEN AND PELVIS: LIVER: The liver is unremarkable. GALLBLADDER AND BILE DUCTS: Gallbladder is unremarkable. No biliary ductal dilatation. SPLEEN: The spleen is unremarkable. PANCREAS: The pancreas is unremarkable. ADRENAL GLANDS: Bilateral adrenal glands demonstrate no acute abnormality. KIDNEYS, URETERS AND BLADDER: No stones in the kidneys or ureters. No hydronephrosis. No perinephric or periureteral stranding. Urinary bladder is unremarkable. GI AND BOWEL: Stomach and duodenal sweep demonstrate  no acute abnormality. There are numerous colonic diverticula involving the descending colon and there is questionable mild soft tissue stranding. The small bowel and appendix are unremarkable. There is no bowel obstruction. No abnormal bowel wall thickening or distension. REPRODUCTIVE: The patient is status post hysterectomy and bilateral salpingo-oophorectomy. PERITONEUM AND RETROPERITONEUM: No ascites or free air. LYMPH NODES: No lymphadenopathy. ABDOMINAL BONES AND SOFT TISSUES: There are degenerative changes present throughout the lumbar spine. No acute soft tissue abnormality. IMPRESSION: 1. No evidence of acute aortic syndrome. 2. Moderate calcific coronary artery disease. 3. 2 to 3 mm solid pulmonary nodule in the right lower lobe; for incidental nodule 05 mm without specified high-risk status, no routine follow-up imaging is recommended per Fleischner Society Guidelines. 4. Numerous  descending colonic diverticula with questionable mild pericolic soft tissue stranding. Electronically signed by: Evalene Coho MD 08/06/2024 04:31 AM EST RP Workstation: [id number]   CT Head Wo Contrast Result Date: 08/06/2024 EXAM: CT HEAD WITHOUT CONTRAST 08/06/2024 04:00:29 AM TECHNIQUE: CT of the head was performed without the administration of intravenous contrast. Automated exposure control, iterative reconstruction, and/or weight based adjustment of the mA/kV was utilized to reduce the radiation dose to as low as reasonably achievable. COMPARISON: MRI brain 05/20/2021 and head CT 01/25/2016. CLINICAL HISTORY: Mental status change, unknown cause. FINDINGS: BRAIN AND VENTRICLES: There is metallic artifact partially obscuring visualization of the right occipital lobe due to an overlying ponytail holder. There is mild cerebral atrophy and small vessel disease. The cerebellum and brainstem are unremarkable. No acute hemorrhage. No cortical-based acute infarct. No mass effect or midline shift. No hydrocephalus. No extra-axial collection. Basal cisterns are clear. There are patchy calcifications in the carotid siphons, with no hyperdense vessels. There is a partially empty sella. ORBITS: There have been interval lens replacements. No acute abnormality. SINUSES: There is left-sided deviation and spurring of the nasal septum. No acute abnormality. SOFT TISSUES AND SKULL: No acute soft tissue abnormality. No skull fracture. IMPRESSION: 1. No acute intracranial  CT findings. stable exam. 2. Chronic changes. 3. Partially empty sella. Electronically signed by: Francis Quam MD 08/06/2024 04:18 AM EST RP Workstation: [id number]   DG Chest Port 1 View Result Date: 08/06/2024 EXAM: 1 VIEW(S) XRAY OF THE CHEST 08/06/2024 03:57:00 AM COMPARISON: 05/24/2024 CLINICAL HISTORY: chest pain FINDINGS: LUNGS AND PLEURA: No focal pulmonary opacity. No pleural effusion. No pneumothorax. HEART AND MEDIASTINUM: Atherosclerotic  plaque noted. No acute abnormality of the cardiac and mediastinal silhouettes. BONES AND SOFT TISSUES: No acute osseous abnormality. IMPRESSION: 1. No acute cardiopulmonary findings. Electronically signed by: Oneil Devonshire MD 08/06/2024 04:01 AM EST RP Workstation: [id number]     LOS: 1 day   Donalda Applebaum, MD  [facility]    To contact the attending provider between 7A-7P or the covering provider during after hours 7P-7A, please log into the web site [email or web address] and access using universal  password for that web site. If you do not have the password, please call the hospital operator.  08/07/2024, 8:34 AM

## 2024-08-07 NOTE — TOC Transition Note (Signed)
 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 She does have support from 2 sisters when needed. Patient drives herself, manages own medications, and has PCP. She has a cane at home for DME.   After therapy eval, patient declined any f/u for this. Plan for d/c to home tomorrow.   No further needs identified by CM.   Final next level of care: Home/Self Care Barriers to Discharge: Continued Medical Work up   Patient Goals and CMS Choice            Discharge Placement                       Discharge Plan and Services Additional resources added to the After Visit Summary for                                       Social Drivers of Health (SDOH) Interventions SDOH Screenings   Food Insecurity: No Food Insecurity (08/06/2024)  Housing: Low Risk  (08/06/2024)  Transportation Needs: No Transportation Needs (08/06/2024)  Utilities: Not At Risk (08/06/2024)  Alcohol Screen: Low Risk  (08/22/2023)  Depression (PHQ2-9): Medium Risk (07/25/2024)  Financial Resource Strain: Low Risk  (08/22/2023)  Physical Activity: Inactive (08/22/2023)  Social Connections: Moderately Isolated (08/06/2024)  Stress: No Stress Concern Present (08/22/2023)  Tobacco Use: Medium Risk (08/06/2024)  Health Literacy: Adequate Health Literacy (08/22/2023)     Readmission Risk Interventions    07/05/2023    3:45 PM  Readmission Risk Prevention Plan  Post Dischage Appt Complete  Medication Screening Complete   Transportation Screening Complete

## 2024-08-07 NOTE — Progress Notes (Signed)
  Echocardiogram 2D Echocardiogram has been performed.  Norleen ORN [facility] 08/07/2024, 10:33 AM

## 2024-08-08 DIAGNOSIS — R112 Nausea with vomiting, unspecified: Secondary | ICD-10-CM | POA: Diagnosis not present

## 2024-08-08 DIAGNOSIS — E119 Type 2 diabetes mellitus without complications: Secondary | ICD-10-CM | POA: Diagnosis not present

## 2024-08-08 DIAGNOSIS — F419 Anxiety disorder, unspecified: Secondary | ICD-10-CM | POA: Diagnosis not present

## 2024-08-08 DIAGNOSIS — I10 Essential (primary) hypertension: Secondary | ICD-10-CM | POA: Diagnosis not present

## 2024-08-08 LAB — GLUCOSE, CAPILLARY: Glucose-Capillary: 159 mg/dL — ABNORMAL HIGH (ref 70–99)

## 2024-08-08 MED ORDER — LANTUS SOLOSTAR 100 UNIT/ML ~~LOC~~ SOPN: 40.0000 [IU] | PEN_INJECTOR | Freq: Every day | SUBCUTANEOUS | Status: DC

## 2024-08-08 MED ORDER — SENNOSIDES-DOCUSATE SODIUM 8.6-50 MG PO TABS: 2.0000 | ORAL_TABLET | Freq: Every day | ORAL | 0 refills | Status: DC

## 2024-08-08 MED ORDER — POLYETHYLENE GLYCOL 3350 17 G PO PACK: 17.0000 g | PACK | Freq: Two times a day (BID) | ORAL | 0 refills | Status: AC

## 2024-08-08 MED ORDER — SENNOSIDES-DOCUSATE SODIUM 8.6-50 MG PO TABS: 2.0000 | ORAL_TABLET | Freq: Every day | ORAL | Status: DC

## 2024-08-08 MED ORDER — POLYETHYLENE GLYCOL 3350 17 G PO PACK: 17.0000 g | PACK | Freq: Two times a day (BID) | ORAL | Status: DC

## 2024-08-08 NOTE — TOC Transition Note (Signed)
 Transition of Care [facility]) - Discharge Note   Patient Details  Name: Allison Yoder MRN: [id number] Date of Birth: [birth year]  Transition of Care [facility]) CM/SW Contact:  Tom-Johnson, Harvest Muskrat, RN Phone Number: 08/08/2024, 9:32 AM   Clinical Narrative:     Patient is scheduled for discharge today.  Readmission Risk Assessment done. Outpatient f/u, hospital f/u and discharge instructions on AVS. Patient declined PT f/u. Denies any TOC needs at this time.  Brother, Christopher to transport at discharge.  No further ICM needs noted.        Final next level of care: Home/Self Care Barriers to Discharge: Barriers Resolved   Patient Goals and CMS Choice Patient states their goals for this hospitalization and ongoing recovery are:: To retun home CMS Medicare.gov Compare Post Acute Care list provided to:: Patient Choice offered to / list presented to : NA      Discharge Placement                Patient to be transferred to facility by: Brother Name of family member notified: Christopher    Discharge Plan and Services Additional resources added to the After Visit Summary for                  DME Arranged: N/A DME Agency: NA       HH Arranged: Refused HH HH Agency: NA        Social Drivers of Health (SDOH) Interventions SDOH Screenings   Food Insecurity: No Food Insecurity (08/06/2024)  Housing: Low Risk  (08/06/2024)  Transportation Needs: No Transportation Needs (08/06/2024)  Utilities: Not At Risk (08/06/2024)  Alcohol Screen: Low Risk  (08/22/2023)  Depression (PHQ2-9): Medium Risk (07/25/2024)  Financial Resource Strain: Low Risk  (08/22/2023)  Physical Activity: Inactive (08/22/2023)  Social Connections: Moderately Isolated (08/06/2024)  Stress: No Stress Concern Present (08/22/2023)  Tobacco Use: Medium Risk (08/06/2024)  Health Literacy: Adequate Health Literacy (08/22/2023)     Readmission Risk Interventions    08/08/2024    9:29 AM 07/05/2023     3:45 PM  Readmission Risk Prevention Plan  Post Dischage Appt Complete Complete  Medication Screening Complete Complete  Transportation Screening Complete Complete

## 2024-08-08 NOTE — Plan of Care (Signed)
  Problem: Coping: Goal: Ability to adjust to condition or change in health will improve Outcome: Progressing   Problem: Clinical Measurements: Goal: Will remain free from infection Outcome: Progressing Goal: Respiratory complications will improve Outcome: Progressing   Problem: Coping: Goal: Level of anxiety will decrease Outcome: Progressing

## 2024-08-08 NOTE — Discharge Summary (Signed)
 PATIENT DETAILS Name: Allison Yoder Age: 73 y.o. Sex: female Date of Birth: [birth year] MRN: [id number]. Admitting Physician: Darcel Dawley, MD ERE:Ilwrjw, Arlyss RAMAN, MD  Admit Date: 08/06/2024 Discharge date: 08/08/2024  Recommendations for Outpatient Follow-up:  Follow up with PCP in 1-2 weeks Please obtain CMP/CBC in one week Please see below regarding incidental finding of lung nodule.  Admitted From:  Home  Disposition: Home   Discharge Condition: good  CODE STATUS:   Code Status: Full Code   Diet recommendation:  Diet Order             DIET SOFT Room service appropriate? Yes; Fluid consistency: Thin  Diet effective now           Diet - low sodium heart healthy           Diet renal/carb modified with fluid restriction                    Brief Summary: Patient is a 73 y.o.  female with history of DM-2, HTN, HLD, anxiety/depression-who was recently enrolled in a clinical research trial-and received a first dose of study drug injection-following which she had multiple episodes of nausea, vomiting, diarrhea and some confusion.  She subsequently presented to the ED and was admitted to the hospitalist service.   Significant events: 11/26>> admit to [facility].   Significant studies: 11/25>> CXR: No PNA 11/25>> CT angio chest/abdomen/pelvis: No dissection, no central PE, 2-3 solid pulmonary nodule. 11/25>> MRI brain: No acute intracranial abnormality. 11/26>> echo: EF 65-70%, grade 1 diastolic dysfunction 11/26>> B/L lower extremity Doppler: No DVT.   Significant microbiology data: None   Procedures: None   Consults: None    Brief Hospital Course: Acute metabolic encephalopathy I suspect this was secondary to dehydration in the setting of nausea/vomiting/diarrhea Encephalopathy has completely resolved-she is awake/alert and back to baseline.   Intractable nausea/vomiting/diarrhea Probably secondary to research drug study (recently enrolled in a  clinical trial) No major abnormality seen on CT imaging Her nausea/vomiting and diarrhea have completely resolved-tolerating advancement in diet Stool studies never sent as no diarrhea-has resolved rapidly.   Left-sided chest pain Probably musculoskeletal Improved-hardly any pain overnight Troponins negative. CTA negative for central PE or dissection. Echo with stable EF and no wall motion abnormality Lower extremity Dopplers negative for DVT Managed with just supportive care.   DM-2 (A1c 7.8 on 11/25) CBG stable on just 40 units of Lantus  (normally takes 50-60 units every evening) I have advised patient to stay on 40 units of Lantus  for now-until her appetite improves further. Patient does not plan to go back on the trial drug at this point.  She will need to get in touch with her study coordinator.   HTN BP stable Continue metoprolol /lisinopril    HLD Resume Repatha  postdischarge.   Anxiety/depression Appears stable Continue as needed Klonopin  and Celexa .  Constipation No BM in the past several days-but passing flatus-no vomiting Adding MiraLAX /senna on discharge-she is aware that she can purchase Dulcolax over-the-counter if she still does not have a BM in the next several days.   2-3 mm solid pulmonary nodule right lower lobe Incidental finding-since size less than 5 mm-radiology recommending no routine imaging follow-up as no high risk features.  OSA CPAP nightly   Discharge Diagnoses:  Principal Problem:   Intractable nausea and vomiting Active Problems:   Essential hypertension   Anxiety and depression   MDD (major depressive disorder)   OA (osteoarthritis)   Hyperlipidemia associated with type  2 diabetes mellitus (HCC)   Diabetes mellitus without complication [facility])   Discharge Instructions:  Activity:  As tolerated   Discharge Instructions     Call MD for:  difficulty breathing, headache or visual disturbances   Complete by: As directed    Call MD  for:  persistant nausea and vomiting   Complete by: As directed    Call MD for:  severe uncontrolled pain   Complete by: As directed    Diet - low sodium heart healthy   Complete by: As directed    Diet renal/carb modified with fluid restriction   Complete by: As directed    Discharge instructions   Complete by: As directed    Follow with Primary MD  Cleatus Arlyss RAMAN, MD in 1-2 weeks  Please get a complete blood count and chemistry panel checked by your Primary MD at your next visit, and again as instructed by your Primary MD.  Get Medicines reviewed and adjusted: Please take all your medications with you for your next visit with your Primary MD  Laboratory/radiological data: Please request your Primary MD to go over all hospital tests and procedure/radiological results at the follow up, please ask your Primary MD to get all Hospital records sent to his/her office.  In some cases, they will be blood work, cultures and biopsy results pending at the time of your discharge. Please request that your primary care M.D. follows up on these results.  Also Note the following: If you experience worsening of your admission symptoms, develop shortness of breath, life threatening emergency, suicidal or homicidal thoughts you must seek medical attention immediately by calling 911 or calling your MD immediately  if symptoms less severe.  You must read complete instructions/literature along with all the possible adverse reactions/side effects for all the Medicines you take and that have been prescribed to you. Take any new Medicines after you have completely understood and accpet all the possible adverse reactions/side effects.   Do not drive when taking Pain medications or sleeping medications (Benzodaizepines)  Do not take more than prescribed Pain, Sleep and Anxiety Medications. It is not advisable to combine anxiety,sleep and pain medications without talking with your primary care  practitioner  Special Instructions: If you have smoked or chewed Tobacco  in the last 2 yrs please stop smoking, stop any regular Alcohol  and or any Recreational drug use.  Wear Seat belts while driving.  Please note: You were cared for by a hospitalist during your hospital stay. Once you are discharged, your primary care physician will handle any further medical issues. Please note that NO REFILLS for any discharge medications will be authorized once you are discharged, as it is imperative that you return to your primary care physician (or establish a relationship with a primary care physician if you do not have one) for your post hospital discharge needs so that they can reassess your need for medications and monitor your lab values.   Increase activity slowly   Complete by: As directed       Allergies as of 08/08/2024       Reactions   Codeine Itching, Rash   Inapsine [droperidol] Shortness Of Breath, Anxiety, Palpitations, Hypertension   Elevated HR Panic attacks   Keflex [cephalexin] Itching   Crestor  [rosuvastatin ] Other (See Comments)   Aches   Glucophage  [metformin ] Other (See Comments)   GI intolerance  Currently taking 500mg  XR, 1000mg  daily dose with no reported side effects   Lipitor [atorvastatin  Calcium ] Other (  See Comments)   Myalgias   Niaspan [niacin] Other (See Comments)   Flushing    Pravastatin Other (See Comments)   Myalgias   Adhesive [tape] Rash, Other (See Comments)   Blisters   Latex Rash   Latex gloves   Wound Dressing Adhesive Rash        Medication List     PAUSE taking these medications    MARITIME-CV maridebart cafraglutide (AMG 133) or placebo 70 mg/mL SQ injection Wait to take this until your doctor or other care provider tells you to start again. Inject 21 mg into the skin once.       TAKE these medications    albuterol  108 (90 Base) MCG/ACT inhaler Commonly known as: VENTOLIN  HFA INHALE 1 TO 2 PUFFS INTO THE LUNGS EVERY 6  HOURS AS NEEDED FOR WHEEZING OR SHORTNESS OF BREATH   BD Pen Needle Nano 2nd Gen 32G X 4 MM Misc Generic drug: Insulin  Pen Needle USE WITH INSULIN  PEN EVERY DAY AS DIRECTED.   citalopram  20 MG tablet Commonly known as: CELEXA  Take 1.5 tablets (30 mg total) by mouth daily. What changed:  how much to take when to take this additional instructions   clonazePAM  0.5 MG tablet Commonly known as: KLONOPIN  Take 1 tablet (0.5 mg total) by mouth daily as needed for anxiety.   cyclobenzaprine  10 MG tablet Commonly known as: FLEXERIL  Take 1 tablet (10 mg total) by mouth daily as needed for muscle spasms.   Lantus  SoloStar 100 UNIT/ML Solostar Pen Generic drug: insulin  glargine Inject 40 Units into the skin daily. Take 40 units for the next several days-as your appetite improves-slowly increase it back to your usual regimen of 50-60 units daily. What changed:  how much to take additional instructions   lisinopril  40 MG tablet Commonly known as: ZESTRIL  Take 1 tablet (40 mg total) by mouth daily. What changed: when to take this   metFORMIN  500 MG 24 hr tablet Commonly known as: GLUCOPHAGE -XR Take 1 tablet (500 mg total) by mouth 2 (two) times daily with a meal.   metoprolol  succinate 50 MG 24 hr tablet Commonly known as: TOPROL -XL TAKE 1 TABLET BY MOUTH  DAILY WITH OR IMMEDIATELY  FOLLOWING A MEAL What changed:  how much to take how to take this when to take this additional instructions   Multivitamin Women 50+ Tabs Take 1 tablet by mouth daily.   ondansetron  4 MG disintegrating tablet Commonly known as: ZOFRAN -ODT Take 1 tablet (4 mg total) by mouth every 8 (eight) hours as needed for nausea.   OneTouch Delica Plus Lancet33G Misc USE ONCE DAILY AS DIRECTED   OneTouch Ultra test strip Generic drug: glucose blood as directed to check blood sugar daily. Dx E11.9   pantoprazole  40 MG tablet Commonly known as: PROTONIX  Take 1 tablet (40 mg total) by mouth daily.    polyethylene glycol 17 g packet Commonly known as: MIRALAX  / GLYCOLAX  Take 17 g by mouth 2 (two) times daily.   Repatha  SureClick 140 MG/ML Soaj Generic drug: Evolocumab  ADMINISTER 1 ML UNDER THE SKIN EVERY 14 DAYS What changed: See the new instructions.   senna-docusate 8.6-50 MG tablet Commonly known as: Senokot-S Take 2 tablets by mouth at bedtime.   VITAMIN D -3 PO Take 1 capsule by mouth daily.        Follow-up Information     Cleatus Arlyss RAMAN, MD. Schedule an appointment as soon as possible for a visit in 1 week(s).   Specialty: Family Medicine Contact information:  [address] [place] [address] [phone]                Allergies  Allergen Reactions   Codeine Itching and Rash   Inapsine [Droperidol] Shortness Of Breath, Anxiety, Palpitations and Hypertension    Elevated HR Panic attacks   Keflex [Cephalexin] Itching   Crestor  [Rosuvastatin ] Other (See Comments)    Aches   Glucophage  [Metformin ] Other (See Comments)    GI intolerance  Currently taking 500mg  XR, 1000mg  daily dose with no reported side effects   Lipitor [Atorvastatin  Calcium ] Other (See Comments)    Myalgias    Niaspan [Niacin] Other (See Comments)    Flushing    Pravastatin Other (See Comments)    Myalgias   Adhesive [Tape] Rash and Other (See Comments)    Blisters   Latex Rash    Latex gloves   Wound Dressing Adhesive Rash     Other Procedures/Studies: ECHOCARDIOGRAM COMPLETE Result Date: 08/07/2024    ECHOCARDIOGRAM REPORT   Patient Name:   JAIANNA NICOLL Date of Exam: 08/07/2024 Medical Rec #:  [id number]       Height:       65.5 in Accession #:    [id number]      Weight:       180.0 lb Date of Birth:  August 17, 1951       BSA:          1.902 m Patient Age:    73 years        BP:           131/78 mmHg Patient Gender: F               HR:           86 bpm. Exam Location:  Inpatient Procedure: 2D Echo (Both Spectral and Color Flow Doppler were utilized during             procedure). Indications:    CP  History:        Patient has prior history of Echocardiogram examinations.                 Signs/Symptoms:Chest Pain.  Sonographer:    Norleen Amour Referring Phys: LEOMA DONALDA HERO Dorri Ozturk IMPRESSIONS  1. Left ventricular ejection fraction, by estimation, is 65 to 70%. The left ventricle has normal function. The left ventricle has no regional wall motion abnormalities. There is mild left ventricular hypertrophy. Left ventricular diastolic parameters are consistent with Grade I diastolic dysfunction (impaired relaxation).  2. Right ventricular systolic function is normal. The right ventricular size is normal. There is normal pulmonary artery systolic pressure. The estimated right ventricular systolic pressure is 15.8 mmHg.  3. The mitral valve is normal in structure. Trivial mitral valve regurgitation. No evidence of mitral stenosis.  4. The aortic valve is tricuspid. Aortic valve regurgitation is not visualized. No aortic stenosis is present.  5. The inferior vena cava is normal in size with greater than 50% respiratory variability, suggesting right atrial pressure of 3 mmHg. FINDINGS  Left Ventricle: Left ventricular ejection fraction, by estimation, is 65 to 70%. The left ventricle has normal function. The left ventricle has no regional wall motion abnormalities. The left ventricular internal cavity size was normal in size. There is  mild left ventricular hypertrophy. Left ventricular diastolic parameters are consistent with Grade I diastolic dysfunction (impaired relaxation). Right Ventricle: The right ventricular size is normal. No increase in right ventricular wall thickness. Right ventricular systolic function  is normal. There is normal pulmonary artery systolic pressure. The tricuspid regurgitant velocity is 1.79 m/s, and  with an assumed right atrial pressure of 3 mmHg, the estimated right ventricular systolic pressure is 15.8 mmHg. Left Atrium: Left atrial size was normal in  size. Right Atrium: Right atrial size was normal in size. Pericardium: Trivial pericardial effusion is present. Mitral Valve: The mitral valve is normal in structure. Trivial mitral valve regurgitation. No evidence of mitral valve stenosis. MV peak gradient, 4.0 mmHg. The mean mitral valve gradient is 2.0 mmHg. Tricuspid Valve: The tricuspid valve is normal in structure. Tricuspid valve regurgitation is trivial. Aortic Valve: The aortic valve is tricuspid. Aortic valve regurgitation is not visualized. No aortic stenosis is present. Aortic valve mean gradient measures 3.0 mmHg. Aortic valve peak gradient measures 5.3 mmHg. Aortic valve area, by VTI measures 3.16 cm. Pulmonic Valve: The pulmonic valve was not well visualized. Pulmonic valve regurgitation is trivial. Aorta: The aortic root and ascending aorta are structurally normal, with no evidence of dilitation. Venous: The inferior vena cava is normal in size with greater than 50% respiratory variability, suggesting right atrial pressure of 3 mmHg. IAS/Shunts: The interatrial septum was not well visualized.  LEFT VENTRICLE PLAX 2D LVIDd:         3.70 cm     Diastology LVIDs:         2.30 cm     LV e' medial:    4.66 cm/s LV PW:         1.10 cm     LV E/e' medial:  14.3 LV IVS:        1.20 cm     LV e' lateral:   5.13 cm/s LVOT diam:     1.80 cm     LV E/e' lateral: 13.0 LV SV:         63 LV SV Index:   33 LVOT Area:     2.54 cm  LV Volumes (MOD) LV vol d, MOD A2C: 54.3 ml LV vol d, MOD A4C: 74.7 ml LV vol s, MOD A2C: 11.9 ml LV vol s, MOD A4C: 24.6 ml LV SV MOD A2C:     42.4 ml LV SV MOD A4C:     74.7 ml LV SV MOD BP:      46.2 ml RIGHT VENTRICLE             IVC RV Basal diam:  2.70 cm     IVC diam: 1.60 cm RV S prime:     14.30 cm/s TAPSE (M-mode): 2.0 cm      PULMONARY VEINS                             Diastolic Velocity: 57.50 cm/s                             S/D Velocity:       1.20                             Systolic Velocity:  68.10 cm/s LEFT ATRIUM              Index        RIGHT ATRIUM          Index LA diam:        3.50 cm 1.84 cm/m   RA Area:  7.91 cm LA Vol (A2C):   34.7 ml 18.24 ml/m  RA Volume:   12.50 ml 6.57 ml/m LA Vol (A4C):   55.0 ml 28.91 ml/m LA Biplane Vol: 44.6 ml 23.45 ml/m  AORTIC VALVE                    PULMONIC VALVE AV Area (Vmax):    2.66 cm     PV Vmax:          0.84 m/s AV Area (Vmean):   2.51 cm     PV Peak grad:     2.9 mmHg AV Area (VTI):     3.16 cm     PR End Diast Vel: 4.58 msec AV Vmax:           115.00 cm/s AV Vmean:          84.000 cm/s AV VTI:            0.199 m AV Peak Grad:      5.3 mmHg AV Mean Grad:      3.0 mmHg LVOT Vmax:         120.00 cm/s LVOT Vmean:        82.900 cm/s LVOT VTI:          0.247 m LVOT/AV VTI ratio: 1.24  AORTA Ao Root diam: 2.70 cm Ao Asc diam:  3.10 cm MITRAL VALVE                TRICUSPID VALVE MV Area (PHT): 5.34 cm     TR Peak grad:   12.8 mmHg MV Area VTI:   3.56 cm     TR Vmax:        179.00 cm/s MV Peak grad:  4.0 mmHg MV Mean grad:  2.0 mmHg     SHUNTS MV Vmax:       1.00 m/s     Systemic VTI:  0.25 m MV Vmean:      63.2 cm/s    Systemic Diam: 1.80 cm MV Decel Time: 142 msec MV E velocity: 66.70 cm/s MV A velocity: 105.00 cm/s MV E/A ratio:  0.64 Lonni Nanas MD Electronically signed by Lonni Nanas MD Signature Date/Time: 08/07/2024/3:23:58 PM    Final    VAS US  LOWER EXTREMITY VENOUS (DVT) Result Date: 08/07/2024  Lower Venous DVT Study Patient Name:  CATHRYNE MANCEBO  Date of Exam:   08/07/2024 Medical Rec #: [id number]        Accession #:    [id number] Date of Birth: 20-Jan-1951        Patient Gender: F Patient Age:   70 years Exam Location:  [facility] Procedure:      VAS US  LOWER EXTREMITY VENOUS (DVT) Referring Phys: DONALDA APPLEBAUM --------------------------------------------------------------------------------  Indications: Edema, and Left sided chest pain with SOB.  Comparison Study: No priors. Performing Technologist: Ricka Sturdivant-Jones RDMS, RVT   Examination Guidelines: A complete evaluation includes B-mode imaging, spectral Doppler, color Doppler, and power Doppler as needed of all accessible portions of each vessel. Bilateral testing is considered an integral part of a complete examination. Limited examinations for reoccurring indications may be performed as noted. The reflux portion of the exam is performed with the patient in reverse Trendelenburg.  +---------+---------------+---------+-----------+----------+--------------+ RIGHT    CompressibilityPhasicitySpontaneityPropertiesThrombus Aging +---------+---------------+---------+-----------+----------+--------------+ CFV      Full           Yes      Yes                                 +---------+---------------+---------+-----------+----------+--------------+  SFJ      Full                                                        +---------+---------------+---------+-----------+----------+--------------+ FV Prox  Full                                                        +---------+---------------+---------+-----------+----------+--------------+ FV Mid   Full           Yes      Yes                                 +---------+---------------+---------+-----------+----------+--------------+ FV DistalFull                                                        +---------+---------------+---------+-----------+----------+--------------+ PFV      Full                                                        +---------+---------------+---------+-----------+----------+--------------+ POP      Full           Yes      Yes                                 +---------+---------------+---------+-----------+----------+--------------+ PTV      Full                                                        +---------+---------------+---------+-----------+----------+--------------+ PERO     Full                                                         +---------+---------------+---------+-----------+----------+--------------+   +---------+---------------+---------+-----------+----------+--------------+ LEFT     CompressibilityPhasicitySpontaneityPropertiesThrombus Aging +---------+---------------+---------+-----------+----------+--------------+ CFV      Full           Yes      Yes                                 +---------+---------------+---------+-----------+----------+--------------+ SFJ      Full                                                        +---------+---------------+---------+-----------+----------+--------------+  FV Prox  Full                                                        +---------+---------------+---------+-----------+----------+--------------+ FV Mid   Full           Yes      Yes                                 +---------+---------------+---------+-----------+----------+--------------+ FV DistalFull                                                        +---------+---------------+---------+-----------+----------+--------------+ PFV      Full                                                        +---------+---------------+---------+-----------+----------+--------------+ POP      Full           Yes      Yes                                 +---------+---------------+---------+-----------+----------+--------------+ PTV      Full                                                        +---------+---------------+---------+-----------+----------+--------------+ PERO     Full                                                        +---------+---------------+---------+-----------+----------+--------------+     Summary: BILATERAL: - No evidence of deep vein thrombosis seen in the lower extremities, bilaterally. -No evidence of popliteal cyst, bilaterally.   *See table(s) above for measurements and observations. Electronically signed by Lonni Gaskins MD on 08/07/2024 at  1:37:43 PM.    Final    MR BRAIN WO CONTRAST Result Date: 08/06/2024 EXAM: MRI BRAIN WITHOUT CONTRAST 08/06/2024 05:45:37 AM TECHNIQUE: Multiplanar multisequence MRI of the head/brain was performed without the administration of intravenous contrast. COMPARISON: MRI of the head dated 05/20/2021. CLINICAL HISTORY: Mental status change, unknown cause. FINDINGS: BRAIN AND VENTRICLES: No acute infarct. No intracranial hemorrhage. No mass. No midline shift. No hydrocephalus. Mild periventricular and subcortical white matter disease present. The sella is unremarkable. Normal flow voids. ORBITS: No acute abnormality. SINUSES AND MASTOIDS: No acute abnormality. BONES AND SOFT TISSUES: Normal marrow signal. No acute soft tissue abnormality. IMPRESSION: 1. No acute intracranial abnormality. 2. Mild periventricular and subcortical white matter disease. Electronically signed by: Evalene Coho MD 08/06/2024 05:50 AM EST RP Workstation: [id number]   CT Angio  Chest/Abd/Pel for Dissection W and/or W/WO Result Date: 08/06/2024 EXAM: CTA CHEST, ABDOMEN AND PELVIS WITHOUT AND WITH CONTRAST 08/06/2024 04:00:29 AM TECHNIQUE: CTA of the chest was performed without and with the administration of intravenous contrast. CTA of the abdomen and pelvis was performed without and with the administration of intravenous contrast. 100 mL of iohexol  (OMNIPAQUE ) 350 MG/ML injection was administered. Multiplanar reformatted images are provided for review. MIP images are provided for review. Automated exposure control, iterative reconstruction, and/or weight based adjustment of the mA/kV was utilized to reduce the radiation dose to as low as reasonably achievable. COMPARISON: CT of the abdomen and pelvis dated 05/14/2023. CLINICAL HISTORY: Acute aortic syndrome (AAS) suspected. FINDINGS: VASCULATURE: AORTA: The thoracic aorta is normal in caliber and demonstrates mild-to-moderate calcific atheromatous disease. There is no evidence of acute  aortic syndrome. The abdominal aorta demonstrates moderate calcific atheromatous disease. No abdominal aortic aneurysm. No dissection. PULMONARY ARTERIES: The pulmonary arteries are normal in caliber and appear widely patent. No pulmonary embolism with the limits of this exam. GREAT VESSELS OF AORTIC ARCH: No acute finding. No dissection. No arterial occlusion or significant stenosis. CELIAC TRUNK: No acute finding. No occlusion or significant stenosis. SUPERIOR MESENTERIC ARTERY: Widely patent. No acute finding. No occlusion or significant stenosis. INFERIOR MESENTERIC ARTERY: Widely patent. No acute finding. No occlusion or significant stenosis. RENAL ARTERIES: Widely patent. No acute finding. No occlusion or significant stenosis. ILIAC ARTERIES: No acute finding. No occlusion or significant stenosis. CHEST: MEDIASTINUM: No mediastinal lymphadenopathy. The heart and pericardium demonstrate no acute abnormality. There is moderate calcific coronary artery disease present. LUNGS AND PLEURA: There is a 2 to 3 mm nodule present laterally within the right lower lobe on image 80 of series 9. There is minimal dependent atelectasis. No focal consolidation or pulmonary edema. No evidence of pleural effusion or pneumothorax. THORACIC BONES AND SOFT TISSUES: No acute bone or soft tissue abnormality. ABDOMEN AND PELVIS: LIVER: The liver is unremarkable. GALLBLADDER AND BILE DUCTS: Gallbladder is unremarkable. No biliary ductal dilatation. SPLEEN: The spleen is unremarkable. PANCREAS: The pancreas is unremarkable. ADRENAL GLANDS: Bilateral adrenal glands demonstrate no acute abnormality. KIDNEYS, URETERS AND BLADDER: No stones in the kidneys or ureters. No hydronephrosis. No perinephric or periureteral stranding. Urinary bladder is unremarkable. GI AND BOWEL: Stomach and duodenal sweep demonstrate no acute abnormality. There are numerous colonic diverticula involving the descending colon and there is questionable mild soft  tissue stranding. The small bowel and appendix are unremarkable. There is no bowel obstruction. No abnormal bowel wall thickening or distension. REPRODUCTIVE: The patient is status post hysterectomy and bilateral salpingo-oophorectomy. PERITONEUM AND RETROPERITONEUM: No ascites or free air. LYMPH NODES: No lymphadenopathy. ABDOMINAL BONES AND SOFT TISSUES: There are degenerative changes present throughout the lumbar spine. No acute soft tissue abnormality. IMPRESSION: 1. No evidence of acute aortic syndrome. 2. Moderate calcific coronary artery disease. 3. 2 to 3 mm solid pulmonary nodule in the right lower lobe; for incidental nodule 05 mm without specified high-risk status, no routine follow-up imaging is recommended per Fleischner Society Guidelines. 4. Numerous descending colonic diverticula with questionable mild pericolic soft tissue stranding. Electronically signed by: Evalene Coho MD 08/06/2024 04:31 AM EST RP Workstation: [id number]   CT Head Wo Contrast Result Date: 08/06/2024 EXAM: CT HEAD WITHOUT CONTRAST 08/06/2024 04:00:29 AM TECHNIQUE: CT of the head was performed without the administration of intravenous contrast. Automated exposure control, iterative reconstruction, and/or weight based adjustment of the mA/kV was utilized to reduce the radiation dose to as low as  reasonably achievable. COMPARISON: MRI brain 05/20/2021 and head CT 01/25/2016. CLINICAL HISTORY: Mental status change, unknown cause. FINDINGS: BRAIN AND VENTRICLES: There is metallic artifact partially obscuring visualization of the right occipital lobe due to an overlying ponytail holder. There is mild cerebral atrophy and small vessel disease. The cerebellum and brainstem are unremarkable. No acute hemorrhage. No cortical-based acute infarct. No mass effect or midline shift. No hydrocephalus. No extra-axial collection. Basal cisterns are clear. There are patchy calcifications in the carotid siphons, with no hyperdense vessels.  There is a partially empty sella. ORBITS: There have been interval lens replacements. No acute abnormality. SINUSES: There is left-sided deviation and spurring of the nasal septum. No acute abnormality. SOFT TISSUES AND SKULL: No acute soft tissue abnormality. No skull fracture. IMPRESSION: 1. No acute intracranial  CT findings. stable exam. 2. Chronic changes. 3. Partially empty sella. Electronically signed by: Francis Quam MD 08/06/2024 04:18 AM EST RP Workstation: [id number]   DG Chest Port 1 View Result Date: 08/06/2024 EXAM: 1 VIEW(S) XRAY OF THE CHEST 08/06/2024 03:57:00 AM COMPARISON: 05/24/2024 CLINICAL HISTORY: chest pain FINDINGS: LUNGS AND PLEURA: No focal pulmonary opacity. No pleural effusion. No pneumothorax. HEART AND MEDIASTINUM: Atherosclerotic plaque noted. No acute abnormality of the cardiac and mediastinal silhouettes. BONES AND SOFT TISSUES: No acute osseous abnormality. IMPRESSION: 1. No acute cardiopulmonary findings. Electronically signed by: Oneil Devonshire MD 08/06/2024 04:01 AM EST RP Workstation: [id number]     TODAY-DAY OF DISCHARGE:  Subjective:   Thedora Siordia today has no headache,no chest abdominal pain,no new weakness tingling or numbness, feels much better wants to go home today.   Objective:   Blood pressure 132/80, pulse 77, temperature 98.2 F (36.8 C), temperature source Oral, resp. rate 16, height 5' 5.5 (1.664 m), weight 81.6 kg, SpO2 95%.  Intake/Output Summary (Last 24 hours) at 08/08/2024 0841 Last data filed at 08/07/2024 1700 Gross per 24 hour  Intake 3115.81 ml  Output --  Net 3115.81 ml   Filed Weights   08/06/24 1658  Weight: 81.6 kg    Exam: Awake Alert, Oriented *3, No new F.N deficits, Normal affect [place].AT,PERRAL Supple Neck,No JVD, No cervical lymphadenopathy appriciated.  Symmetrical Chest wall movement, Good air movement bilaterally, CTAB RRR,No Gallops,Rubs or new Murmurs, No Parasternal Heave +ve B.Sounds, Abd Soft, Non  tender, No organomegaly appriciated, No rebound -guarding or rigidity. No Cyanosis, Clubbing or edema, No new Rash or bruise   PERTINENT RADIOLOGIC STUDIES: ECHOCARDIOGRAM COMPLETE Result Date: 08/07/2024    ECHOCARDIOGRAM REPORT   Patient Name:   Allison Yoder Date of Exam: 08/07/2024 Medical Rec #:  [id number]       Height:       65.5 in Accession #:    [id number]      Weight:       180.0 lb Date of Birth:  19-Sep-1950       BSA:          1.902 m Patient Age:    73 years        BP:           131/78 mmHg Patient Gender: F               HR:           86 bpm. Exam Location:  Inpatient Procedure: 2D Echo (Both Spectral and Color Flow Doppler were utilized during            procedure). Indications:    CP  History:  Patient has prior history of Echocardiogram examinations.                 Signs/Symptoms:Chest Pain.  Sonographer:    Norleen Amour Referring Phys: LEOMA DONALDA HERO [place] IMPRESSIONS  1. Left ventricular ejection fraction, by estimation, is 65 to 70%. The left ventricle has normal function. The left ventricle has no regional wall motion abnormalities. There is mild left ventricular hypertrophy. Left ventricular diastolic parameters are consistent with Grade I diastolic dysfunction (impaired relaxation).  2. Right ventricular systolic function is normal. The right ventricular size is normal. There is normal pulmonary artery systolic pressure. The estimated right ventricular systolic pressure is 15.8 mmHg.  3. The mitral valve is normal in structure. Trivial mitral valve regurgitation. No evidence of mitral stenosis.  4. The aortic valve is tricuspid. Aortic valve regurgitation is not visualized. No aortic stenosis is present.  5. The inferior vena cava is normal in size with greater than 50% respiratory variability, suggesting right atrial pressure of 3 mmHg. FINDINGS  Left Ventricle: Left ventricular ejection fraction, by estimation, is 65 to 70%. The left ventricle has normal function. The left  ventricle has no regional wall motion abnormalities. The left ventricular internal cavity size was normal in size. There is  mild left ventricular hypertrophy. Left ventricular diastolic parameters are consistent with Grade I diastolic dysfunction (impaired relaxation). Right Ventricle: The right ventricular size is normal. No increase in right ventricular wall thickness. Right ventricular systolic function is normal. There is normal pulmonary artery systolic pressure. The tricuspid regurgitant velocity is 1.79 m/s, and  with an assumed right atrial pressure of 3 mmHg, the estimated right ventricular systolic pressure is 15.8 mmHg. Left Atrium: Left atrial size was normal in size. Right Atrium: Right atrial size was normal in size. Pericardium: Trivial pericardial effusion is present. Mitral Valve: The mitral valve is normal in structure. Trivial mitral valve regurgitation. No evidence of mitral valve stenosis. MV peak gradient, 4.0 mmHg. The mean mitral valve gradient is 2.0 mmHg. Tricuspid Valve: The tricuspid valve is normal in structure. Tricuspid valve regurgitation is trivial. Aortic Valve: The aortic valve is tricuspid. Aortic valve regurgitation is not visualized. No aortic stenosis is present. Aortic valve mean gradient measures 3.0 mmHg. Aortic valve peak gradient measures 5.3 mmHg. Aortic valve area, by VTI measures 3.16 cm. Pulmonic Valve: The pulmonic valve was not well visualized. Pulmonic valve regurgitation is trivial. Aorta: The aortic root and ascending aorta are structurally normal, with no evidence of dilitation. Venous: The inferior vena cava is normal in size with greater than 50% respiratory variability, suggesting right atrial pressure of 3 mmHg. IAS/Shunts: The interatrial septum was not well visualized.  LEFT VENTRICLE PLAX 2D LVIDd:         3.70 cm     Diastology LVIDs:         2.30 cm     LV e' medial:    4.66 cm/s LV PW:         1.10 cm     LV E/e' medial:  14.3 LV IVS:        1.20 cm      LV e' lateral:   5.13 cm/s LVOT diam:     1.80 cm     LV E/e' lateral: 13.0 LV SV:         63 LV SV Index:   33 LVOT Area:     2.54 cm  LV Volumes (MOD) LV vol d, MOD A2C: 54.3 ml LV vol  d, MOD A4C: 74.7 ml LV vol s, MOD A2C: 11.9 ml LV vol s, MOD A4C: 24.6 ml LV SV MOD A2C:     42.4 ml LV SV MOD A4C:     74.7 ml LV SV MOD BP:      46.2 ml RIGHT VENTRICLE             IVC RV Basal diam:  2.70 cm     IVC diam: 1.60 cm RV S prime:     14.30 cm/s TAPSE (M-mode): 2.0 cm      PULMONARY VEINS                             Diastolic Velocity: 57.50 cm/s                             S/D Velocity:       1.20                             Systolic Velocity:  68.10 cm/s LEFT ATRIUM             Index        RIGHT ATRIUM          Index LA diam:        3.50 cm 1.84 cm/m   RA Area:     7.91 cm LA Vol (A2C):   34.7 ml 18.24 ml/m  RA Volume:   12.50 ml 6.57 ml/m LA Vol (A4C):   55.0 ml 28.91 ml/m LA Biplane Vol: 44.6 ml 23.45 ml/m  AORTIC VALVE                    PULMONIC VALVE AV Area (Vmax):    2.66 cm     PV Vmax:          0.84 m/s AV Area (Vmean):   2.51 cm     PV Peak grad:     2.9 mmHg AV Area (VTI):     3.16 cm     PR End Diast Vel: 4.58 msec AV Vmax:           115.00 cm/s AV Vmean:          84.000 cm/s AV VTI:            0.199 m AV Peak Grad:      5.3 mmHg AV Mean Grad:      3.0 mmHg LVOT Vmax:         120.00 cm/s LVOT Vmean:        82.900 cm/s LVOT VTI:          0.247 m LVOT/AV VTI ratio: 1.24  AORTA Ao Root diam: 2.70 cm Ao Asc diam:  3.10 cm MITRAL VALVE                TRICUSPID VALVE MV Area (PHT): 5.34 cm     TR Peak grad:   12.8 mmHg MV Area VTI:   3.56 cm     TR Vmax:        179.00 cm/s MV Peak grad:  4.0 mmHg MV Mean grad:  2.0 mmHg     SHUNTS MV Vmax:       1.00 m/s     Systemic VTI:  0.25 m MV Vmean:      63.2 cm/s  Systemic Diam: 1.80 cm MV Decel Time: 142 msec MV E velocity: 66.70 cm/s MV A velocity: 105.00 cm/s MV E/A ratio:  0.64 Lonni Nanas MD Electronically signed by Lonni Nanas MD  Signature Date/Time: 08/07/2024/3:23:58 PM    Final    VAS US  LOWER EXTREMITY VENOUS (DVT) Result Date: 08/07/2024  Lower Venous DVT Study Patient Name:  MALORY SPURR  Date of Exam:   08/07/2024 Medical Rec #: [id number]        Accession #:    [id number] Date of Birth: 01/23/1951        Patient Gender: F Patient Age:   58 years Exam Location:  [facility] Procedure:      VAS US  LOWER EXTREMITY VENOUS (DVT) Referring Phys: DONALDA APPLEBAUM --------------------------------------------------------------------------------  Indications: Edema, and Left sided chest pain with SOB.  Comparison Study: No priors. Performing Technologist: Ricka Sturdivant-Jones RDMS, RVT  Examination Guidelines: A complete evaluation includes B-mode imaging, spectral Doppler, color Doppler, and power Doppler as needed of all accessible portions of each vessel. Bilateral testing is considered an integral part of a complete examination. Limited examinations for reoccurring indications may be performed as noted. The reflux portion of the exam is performed with the patient in reverse Trendelenburg.  +---------+---------------+---------+-----------+----------+--------------+ RIGHT    CompressibilityPhasicitySpontaneityPropertiesThrombus Aging +---------+---------------+---------+-----------+----------+--------------+ CFV      Full           Yes      Yes                                 +---------+---------------+---------+-----------+----------+--------------+ SFJ      Full                                                        +---------+---------------+---------+-----------+----------+--------------+ FV Prox  Full                                                        +---------+---------------+---------+-----------+----------+--------------+ FV Mid   Full           Yes      Yes                                 +---------+---------------+---------+-----------+----------+--------------+ FV DistalFull                                                         +---------+---------------+---------+-----------+----------+--------------+ PFV      Full                                                        +---------+---------------+---------+-----------+----------+--------------+ POP      Full           Yes  Yes                                 +---------+---------------+---------+-----------+----------+--------------+ PTV      Full                                                        +---------+---------------+---------+-----------+----------+--------------+ PERO     Full                                                        +---------+---------------+---------+-----------+----------+--------------+   +---------+---------------+---------+-----------+----------+--------------+ LEFT     CompressibilityPhasicitySpontaneityPropertiesThrombus Aging +---------+---------------+---------+-----------+----------+--------------+ CFV      Full           Yes      Yes                                 +---------+---------------+---------+-----------+----------+--------------+ SFJ      Full                                                        +---------+---------------+---------+-----------+----------+--------------+ FV Prox  Full                                                        +---------+---------------+---------+-----------+----------+--------------+ FV Mid   Full           Yes      Yes                                 +---------+---------------+---------+-----------+----------+--------------+ FV DistalFull                                                        +---------+---------------+---------+-----------+----------+--------------+ PFV      Full                                                        +---------+---------------+---------+-----------+----------+--------------+ POP      Full           Yes      Yes                                  +---------+---------------+---------+-----------+----------+--------------+ PTV      Full                                                        +---------+---------------+---------+-----------+----------+--------------+  PERO     Full                                                        +---------+---------------+---------+-----------+----------+--------------+     Summary: BILATERAL: - No evidence of deep vein thrombosis seen in the lower extremities, bilaterally. -No evidence of popliteal cyst, bilaterally.   *See table(s) above for measurements and observations. Electronically signed by Lonni Gaskins MD on 08/07/2024 at 1:37:43 PM.    Final      PERTINENT LAB RESULTS: CBC: Recent Labs    08/06/24 0314 08/06/24 0321 08/06/24 0806 08/07/24 0230  WBC 19.0*  --   --  9.4  HGB 15.5*   < > 14.3 12.5  HCT 45.8   < > 42.0 38.0  PLT 231  --   --  159   < > = values in this interval not displayed.   CMET CMP     Component Value Date/Time   NA 140 08/07/2024 0230   K 3.6 08/07/2024 0230   CL 109 08/07/2024 0230   CO2 22 08/07/2024 0230   GLUCOSE 92 08/07/2024 0230   BUN 11 08/07/2024 0230   CREATININE 0.79 08/07/2024 0230   CREATININE 0.86 07/30/2024 1618   CALCIUM  8.3 (L) 08/07/2024 0230   PROT 5.6 (L) 08/07/2024 0230   PROT 7.0 12/08/2020 0000   ALBUMIN 3.2 (L) 08/07/2024 0230   ALBUMIN 4.3 12/08/2020 0000   AST 26 08/07/2024 0230   ALT 31 08/07/2024 0230   ALKPHOS 62 08/07/2024 0230   BILITOT 1.0 08/07/2024 0230   BILITOT 0.6 12/08/2020 0000   GFR 81.25 07/25/2024 1025   EGFR 71 07/30/2024 1618   GFRNONAA >60 08/07/2024 0230    GFR Estimated Creatinine Clearance: 65.8 mL/min (by C-G formula based on SCr of 0.79 mg/dL). Recent Labs    08/06/24 0314  LIPASE 40   No results for input(s): CKTOTAL, CKMB, CKMBINDEX, TROPONINI in the last 72 hours. Invalid input(s): POCBNP No results for input(s): DDIMER in the last 72 hours. Recent  Labs    08/06/24 0314  HGBA1C 7.8*   No results for input(s): CHOL, HDL, LDLCALC, TRIG, CHOLHDL, LDLDIRECT in the last 72 hours. No results for input(s): TSH, T4TOTAL, T3FREE, THYROIDAB in the last 72 hours.  Invalid input(s): FREET3 No results for input(s): VITAMINB12, FOLATE, FERRITIN, TIBC, IRON, RETICCTPCT in the last 72 hours. Coags: No results for input(s): INR in the last 72 hours.  Invalid input(s): PT Microbiology: No results found for this or any previous visit (from the past 240 hours).  FURTHER DISCHARGE INSTRUCTIONS:  Get Medicines reviewed and adjusted: Please take all your medications with you for your next visit with your Primary MD  Laboratory/radiological data: Please request your Primary MD to go over all hospital tests and procedure/radiological results at the follow up, please ask your Primary MD to get all Hospital records sent to his/her office.  In some cases, they will be blood work, cultures and biopsy results pending at the time of your discharge. Please request that your primary care M.D. goes through all the records of your hospital data and follows up on these results.  Also Note the following: If you experience worsening of your admission symptoms, develop shortness of breath, life threatening emergency, suicidal or homicidal thoughts you must seek medical attention immediately  by calling 911 or calling your MD immediately  if symptoms less severe.  You must read complete instructions/literature along with all the possible adverse reactions/side effects for all the Medicines you take and that have been prescribed to you. Take any new Medicines after you have completely understood and accpet all the possible adverse reactions/side effects.   Do not drive when taking Pain medications or sleeping medications (Benzodaizepines)  Do not take more than prescribed Pain, Sleep and Anxiety Medications. It is not advisable to  combine anxiety,sleep and pain medications without talking with your primary care practitioner  Special Instructions: If you have smoked or chewed Tobacco  in the last 2 yrs please stop smoking, stop any regular Alcohol  and or any Recreational drug use.  Wear Seat belts while driving.  Please note: You were cared for by a hospitalist during your hospital stay. Once you are discharged, your primary care physician will handle any further medical issues. Please note that NO REFILLS for any discharge medications will be authorized once you are discharged, as it is imperative that you return to your primary care physician (or establish a relationship with a primary care physician if you do not have one) for your post hospital discharge needs so that they can reassess your need for medications and monitor your lab values.  Total Time spent coordinating discharge including counseling, education and face to face time equals greater than 30 minutes.  SignedBETHA Donalda Applebaum 08/08/2024 8:41 AM

## 2024-08-08 NOTE — Progress Notes (Signed)
 Discharge instructions (including medications) discussed with and copy provided to patient, and she verbalized understanding. PIV x1 removed and patient dressed herself. DC home with family.

## 2024-08-09 NOTE — Telephone Encounter (Signed)
 Received provider portion back on [employer] (Lantus ),waiting on pt portion.

## 2024-08-12 NOTE — Telephone Encounter (Signed)
 Looks like pt was scheduled on 08/16/24 at 10:00 for HFU with Dr Cleatus.   Fyi to Dr Cleatus.

## 2024-08-14 NOTE — Telephone Encounter (Signed)
 Noted. Thanks.

## 2024-08-16 ENCOUNTER — Ambulatory Visit: Admitting: Family Medicine

## 2024-08-16 VITALS — BP 118/70 | HR 85 | Temp 98.0°F | Ht 65.5 in | Wt 183.1 lb

## 2024-08-16 DIAGNOSIS — E1169 Type 2 diabetes mellitus with other specified complication: Secondary | ICD-10-CM

## 2024-08-16 DIAGNOSIS — Z23 Encounter for immunization: Secondary | ICD-10-CM

## 2024-08-16 DIAGNOSIS — E785 Hyperlipidemia, unspecified: Secondary | ICD-10-CM | POA: Diagnosis not present

## 2024-08-16 DIAGNOSIS — E119 Type 2 diabetes mellitus without complications: Secondary | ICD-10-CM

## 2024-08-16 LAB — CBC WITH DIFFERENTIAL/PLATELET
Basophils Absolute: 0.1 K/uL (ref 0.0–0.1)
Basophils Relative: 1 % (ref 0.0–3.0)
Eosinophils Absolute: 0.6 K/uL (ref 0.0–0.7)
Eosinophils Relative: 8.6 % — ABNORMAL HIGH (ref 0.0–5.0)
HCT: 38.7 % (ref 36.0–46.0)
Hemoglobin: 13.2 g/dL (ref 12.0–15.0)
Lymphocytes Relative: 28.4 % (ref 12.0–46.0)
Lymphs Abs: 2.1 K/uL (ref 0.7–4.0)
MCHC: 34 g/dL (ref 30.0–36.0)
MCV: 90.6 fl (ref 78.0–100.0)
Monocytes Absolute: 0.4 K/uL (ref 0.1–1.0)
Monocytes Relative: 5.2 % (ref 3.0–12.0)
Neutro Abs: 4.1 K/uL (ref 1.4–7.7)
Neutrophils Relative %: 56.8 % (ref 43.0–77.0)
Platelets: 158 K/uL (ref 150.0–400.0)
RBC: 4.27 Mil/uL (ref 3.87–5.11)
RDW: 14.4 % (ref 11.5–15.5)
WBC: 7.2 K/uL (ref 4.0–10.5)

## 2024-08-16 LAB — COMPREHENSIVE METABOLIC PANEL WITH GFR
ALT: 40 U/L — ABNORMAL HIGH (ref 0–35)
AST: 33 U/L (ref 0–37)
Albumin: 4.1 g/dL (ref 3.5–5.2)
Alkaline Phosphatase: 87 U/L (ref 39–117)
BUN: 17 mg/dL (ref 6–23)
CO2: 30 meq/L (ref 19–32)
Calcium: 9.5 mg/dL (ref 8.4–10.5)
Chloride: 104 meq/L (ref 96–112)
Creatinine, Ser: 0.74 mg/dL (ref 0.40–1.20)
GFR: 79.9 mL/min (ref >60.00)
Glucose, Bld: 163 mg/dL — ABNORMAL HIGH (ref 70–99)
Potassium: 4.1 meq/L (ref 3.5–5.1)
Sodium: 139 meq/L (ref 135–145)
Total Bilirubin: 0.6 mg/dL (ref 0.2–1.2)
Total Protein: 6.4 g/dL (ref 6.0–8.3)

## 2024-08-16 MED ORDER — LANTUS SOLOSTAR 100 UNIT/ML ~~LOC~~ SOPN: 35.0000 [IU] | PEN_INJECTOR | Freq: Every day | SUBCUTANEOUS | Status: AC

## 2024-08-16 MED ORDER — LANTUS SOLOSTAR 100 UNIT/ML ~~LOC~~ SOPN: 40.0000 [IU] | PEN_INJECTOR | Freq: Every day | SUBCUTANEOUS | Status: DC

## 2024-08-16 MED ORDER — METOPROLOL SUCCINATE ER 50 MG PO TB24: ORAL_TABLET | ORAL | Status: DC

## 2024-08-16 MED ORDER — CITALOPRAM HYDROBROMIDE 20 MG PO TABS: 30.0000 mg | ORAL_TABLET | Freq: Every day | ORAL | Status: DC

## 2024-08-16 NOTE — Progress Notes (Unsigned)
 Inpatient f/u with [facility] after trial med use (maridebart cafraglutide), since discontinued.   D/w pt about not using that med or other GLP1 meds in the future.    She has known h/o hyperlipidemia/CAD tx'd with repatha .  D/w pt. images reviewed.  Previous imaging discussed. 1. No evidence of acute aortic syndrome. 2. Moderate calcific coronary artery disease. 3. 2 to 3 mm solid pulmonary nodule in the right lower lobe.  No follow up needed.   Has been taking 40 units insulin .   Had BM yesterday.  Back to solid meals as of 2 days ago.   She is still diffusely weak and tired.    Sugar has been down to 73, usually 80-120.   D/w pt about cutting insulin  back for now and then gradually increasing her dose if needed.    She has persistent rhinorrhea and sneezing.   She has dermatology f/u pending.   Meds, vitals, and allergies reviewed.   ROS: Per HPI unless specifically indicated in ROS section   GEN: nad, alert and oriented HEENT: mucous membranes moist NECK: supple w/o LA CV: rrr PULM: ctab, no inc wob ABD: soft, +bs, not tender. EXT: no edema SKIN: well perfused.

## 2024-08-16 NOTE — Patient Instructions (Addendum)
 I would cut insulin  back to 35 units for now and see how you feel.   Go to the lab on the way out.   If you have mychart we'll likely use that to update you.    Take care.  Glad to see you.

## 2024-08-18 ENCOUNTER — Ambulatory Visit: Payer: Self-pay | Admitting: Family Medicine

## 2024-08-18 NOTE — Assessment & Plan Note (Signed)
 With recent nausea and vomiting attributed to maridebart cafraglutide.  This was through a study.  She is not going to use the medication anymore.  D/w pt about not using that med or other GLP1 meds in the future.    Would expect GI/abdominal symptoms to gradually improve.  Sugar has been down to 73, usually 80-120.   D/w pt about cutting insulin  back for now and then gradually increasing her dose if needed.  See after visit summary.  See notes on labs.  At this point still okay for outpatient follow-up.  She has improved in the meantime.

## 2024-08-18 NOTE — Assessment & Plan Note (Signed)
Statin intolerant.  Continue Repatha.

## 2024-08-19 ENCOUNTER — Ambulatory Visit: Admitting: Family Medicine

## 2024-08-20 DIAGNOSIS — J3089 Other allergic rhinitis: Secondary | ICD-10-CM | POA: Diagnosis not present

## 2024-08-20 DIAGNOSIS — L821 Other seborrheic keratosis: Secondary | ICD-10-CM | POA: Diagnosis not present

## 2024-08-20 DIAGNOSIS — L988 Other specified disorders of the skin and subcutaneous tissue: Secondary | ICD-10-CM | POA: Diagnosis not present

## 2024-08-20 DIAGNOSIS — L720 Epidermal cyst: Secondary | ICD-10-CM | POA: Diagnosis not present

## 2024-08-20 DIAGNOSIS — Z974 Presence of external hearing-aid: Secondary | ICD-10-CM | POA: Diagnosis not present

## 2024-08-20 DIAGNOSIS — D1801 Hemangioma of skin and subcutaneous tissue: Secondary | ICD-10-CM | POA: Diagnosis not present

## 2024-08-20 DIAGNOSIS — H903 Sensorineural hearing loss, bilateral: Secondary | ICD-10-CM | POA: Diagnosis not present

## 2024-08-20 DIAGNOSIS — J342 Deviated nasal septum: Secondary | ICD-10-CM | POA: Diagnosis not present

## 2024-08-22 ENCOUNTER — Ambulatory Visit: Payer: Medicare Other

## 2024-08-22 VITALS — Ht 66.5 in | Wt 178.0 lb

## 2024-08-22 DIAGNOSIS — Z0000 Encounter for general adult medical examination without abnormal findings: Secondary | ICD-10-CM

## 2024-08-22 NOTE — Patient Instructions (Addendum)
 Allison Yoder,  Thank you for taking the time for your Medicare Wellness Visit. I appreciate your continued commitment to your health goals. Please review the care plan we discussed, and feel free to reach out if I can assist you further.  Please note that Annual Wellness Visits do not include a physical exam. Some assessments may be limited, especially if the visit was conducted virtually. If needed, we may recommend an in-person follow-up with your provider.  Ongoing Care Seeing your primary care provider every 3 to 6 months helps us  monitor your health and provide consistent, personalized care.    Referrals If a referral was made during today's visit and you haven't received any updates within two weeks, please contact the referred provider directly to check on the status.  Recommended Screenings:  Health Maintenance  Topic Date Due   Zoster (Shingles) Vaccine (1 of 2) 07/20/2000   Breast Cancer Screening  06/15/2024   Eye exam for diabetics  10/24/2024   Hemoglobin A1C  02/03/2025   Yearly kidney health urinalysis for diabetes  04/08/2025   Complete foot exam   04/08/2025   Yearly kidney function blood test for diabetes  08/16/2025   Medicare Annual Wellness Visit  08/22/2025   DTaP/Tdap/Td vaccine (3 - Td or Tdap) 07/03/2033   Colon Cancer Screening  09/11/2033   Pneumococcal Vaccine for age over 57  Completed   Flu Shot  Completed   Osteoporosis screening with Bone Density Scan  Completed   Hepatitis C Screening  Completed   Meningitis B Vaccine  Aged Out   Hepatitis B Vaccine  Discontinued   COVID-19 Vaccine  Discontinued       08/22/2024    8:15 AM  Advanced Directives  Does Patient Have a Medical Advance Directive? Yes  Type of Estate Agent of [place];Living will  Does patient want to make changes to medical advance directive? No - Patient declined  Copy of Healthcare Power of Attorney in Chart? No - copy requested    Vision: Annual vision  screenings are recommended for early detection of glaucoma, cataracts, and diabetic retinopathy. These exams can also reveal signs of chronic conditions such as diabetes and high blood pressure.  Dental: Annual dental screenings help detect early signs of oral cancer, gum disease, and other conditions linked to overall health, including heart disease and diabetes.  Please see the attached documents for additional preventive care recommendations.

## 2024-08-22 NOTE — Progress Notes (Signed)
 Chief Complaint  Patient presents with   Medicare Wellness     Subjective:   Allison Yoder is a 73 y.o. female who presents for a Medicare Annual Wellness Visit.  Visit info / Clinical Intake: Medicare Wellness Visit Type:: Subsequent Annual Wellness Visit Persons participating in visit and providing information:: patient Medicare Wellness Visit Mode:: Telephone If telephone:: video declined Since this visit was completed virtually, some vitals may be partially provided or unavailable. Missing vitals are due to the limitations of the virtual format.: Documented vitals are patient reported If Telephone or Video please confirm:: I connected with patient using audio/video enable telemedicine. I verified patient identity with two identifiers, discussed telehealth limitations, and patient agreed to proceed. Patient Location:: Home Provider Location:: Office Interpreter Needed?: No Pre-visit prep was completed: no AWV questionnaire completed by patient prior to visit?: no Living arrangements:: with family/others Patient's Overall Health Status Rating: (!) fair Typical amount of pain: none Does pain affect daily life?: no Are you currently prescribed opioids?: no  Dietary Habits and Nutritional Risks How many meals a day?: 3 Eats fruit and vegetables daily?: yes Most meals are obtained by: preparing own meals In the last 2 weeks, have you had any of the following?: none Diabetic:: (!) yes Any non-healing wounds?: no How often do you check your BS?: continuous glucose monitor Would you like to be referred to a Nutritionist or for Diabetic Management? : no  Functional Status Activities of Daily Living (to include ambulation/medication): Independent Ambulation: Independent with device- listed below Home Assistive Devices/Equipment: Eyeglasses; Other (Comment); CPAP (Hearing Aids) Medication Administration: Independent Home Management (perform basic housework or laundry):  Independent Manage your own finances?: yes Primary transportation is: driving Concerns about vision?: no *vision screening is required for WTM* Concerns about hearing?: (!) yes Uses hearing aids?: (!) yes Hear whispered voice?: (!) no *in-person visit only*  Fall Screening Falls in the past year?: 0 Number of falls in past year: 0 Was there an injury with Fall?: 0 Fall Risk Category Calculator: 0 Patient Fall Risk Level: Low Fall Risk  Fall Risk Patient at Risk for Falls Due to: No Fall Risks Fall risk Follow up: Falls evaluation completed  Home and Transportation Safety: All rugs have non-skid backing?: yes All stairs or steps have railings?: yes Grab bars in the bathtub or shower?: yes Have non-skid surface in bathtub or shower?: yes Good home lighting?: yes Regular seat belt use?: yes Hospital stays in the last year:: (!) yes How many hospital stays:: 1 Reason: Dehydration  Cognitive Assessment Difficulty concentrating, remembering, or making decisions? : no Will 6CIT or Mini Cog be Completed: yes What year is it?: 0 points What month is it?: 0 points Give patient an address phrase to remember (5 components): [address] [place]  About what time is it?: 0 points Count backwards from 20 to 1: 0 points Say the months of the year in reverse: 0 points Repeat the address phrase from earlier: 0 points 6 CIT Score: 0 points  Advance Directives (For Healthcare) Does Patient Have a Medical Advance Directive?: Yes Does patient want to make changes to medical advance directive?: No - Patient declined Type of Advance Directive: Healthcare Power of [place]; Living will Copy of Healthcare Power of Attorney in Chart?: No - copy requested Copy of Living Will in Chart?: No - copy requested  Reviewed/Updated  Reviewed/Updated: Reviewed All (Medical, Surgical, Family, Medications, Allergies, Care Teams, Patient Goals)    Allergies (verified) Codeine, Inapsine  [droperidol],  Keflex [cephalexin], Crestor  [rosuvastatin ], Glucophage  [metformin ], Lipitor [atorvastatin  calcium ], Niaspan [niacin], Other, Pravastatin, Adhesive [tape], Latex, and Wound dressing adhesive   Current Medications (verified) Outpatient Encounter Medications as of 08/22/2024  Medication Sig   albuterol  (VENTOLIN  HFA) 108 (90 Base) MCG/ACT inhaler INHALE 1 TO 2 PUFFS INTO THE LUNGS EVERY 6 HOURS AS NEEDED FOR WHEEZING OR SHORTNESS OF BREATH   Cholecalciferol (VITAMIN D -3 PO) Take 1 capsule by mouth daily.   citalopram  (CELEXA ) 20 MG tablet Take 1.5 tablets (30 mg total) by mouth daily.   clonazePAM  (KLONOPIN ) 0.5 MG tablet Take 1 tablet (0.5 mg total) by mouth daily as needed for anxiety.   cyclobenzaprine  (FLEXERIL ) 10 MG tablet Take 1 tablet (10 mg total) by mouth daily as needed for muscle spasms.   glucose blood (ONETOUCH ULTRA) test strip as directed to check blood sugar daily. Dx E11.9   insulin  glargine (LANTUS  SOLOSTAR) 100 UNIT/ML Solostar Pen Inject 35 Units into the skin daily. Take 35 units for the next several days-as your appetite improves-slowly increase it back to your usual regimen of 50-60 units daily.   Insulin  Pen Needle (BD PEN NEEDLE NANO 2ND GEN) 32G X 4 MM MISC USE WITH INSULIN  PEN EVERY DAY AS DIRECTED.   Lancets (ONETOUCH DELICA PLUS LANCET33G) MISC USE ONCE DAILY AS DIRECTED   lisinopril  (ZESTRIL ) 40 MG tablet Take 1 tablet (40 mg total) by mouth daily.   metFORMIN  (GLUCOPHAGE -XR) 500 MG 24 hr tablet Take 1 tablet (500 mg total) by mouth 2 (two) times daily with a meal.   metoprolol  succinate (TOPROL -XL) 50 MG 24 hr tablet TAKE 1 TABLET BY MOUTH  DAILY WITH OR IMMEDIATELY  FOLLOWING A MEAL   Multiple Vitamins-Minerals (MULTIVITAMIN WOMEN 50+) TABS Take 1 tablet by mouth daily.   ondansetron  (ZOFRAN -ODT) 4 MG disintegrating tablet Take 1 tablet (4 mg total) by mouth every 8 (eight) hours as needed for nausea.   pantoprazole  (PROTONIX ) 40 MG tablet Take 1 tablet  (40 mg total) by mouth daily.   polyethylene glycol (MIRALAX  / GLYCOLAX ) 17 g packet Take 17 g by mouth 2 (two) times daily.   REPATHA  SURECLICK 140 MG/ML SOAJ ADMINISTER 1 ML UNDER THE SKIN EVERY 14 DAYS   No facility-administered encounter medications on file as of 08/22/2024.    History: Past Medical History:  Diagnosis Date   Allergy    Anemia    all the time as a child   Anxiety    Arthritis    all my joints; worse in my hands (10/27/2015)   Chronic lower back pain    Depression    Diverticulosis    Fatty liver    GERD (gastroesophageal reflux disease)    Grade I diastolic dysfunction    Heart murmur    dx'd years ago; very mild; never treated (10/27/2015)   Hyperlipidemia    Hypertension    Hypothyroidism 2002-~ 2010   Iritis    per [facility]   Migraines    stopped when I went thru menopause   NASH (nonalcoholic steatohepatitis)    OSA on CPAP    Pneumonia ~ 2013; 10/27/2015   Recurrent urinary tract infection    Routine general medical examination at a health care facility 02/26/2014   Snores    Stroke (HCC) 2017   TIAs x2.  No deficits   Type II diabetes mellitus (HCC)    lost weight; started exercising again; don't have it anymore (10/27/2015)   Urinary incontinence    Wears glasses  Wears hearing aid in both ears    Past Surgical History:  Procedure Laterality Date   BLEPHAROPLASTY Bilateral 2013; 2016   BREAST LUMPECTOMY Right 1964   benign tumor,   CATARACT EXTRACTION W/PHACO Right 01/31/2023   Procedure: CATARACT EXTRACTION PHACO AND INTRAOCULAR LENS PLACEMENT (IOC) RIGHT DIABETIC  5.04  00:36.2;  Surgeon: Jaye Fallow, MD;  Location: [facility];  Service: Ophthalmology;  Laterality: Right;  Diabetic   CATARACT EXTRACTION W/PHACO Left 02/14/2023   Procedure: CATARACT EXTRACTION PHACO AND INTRAOCULAR LENS PLACEMENT (IOC) LEFT DIABETIC  5.73  00:40.1;  Surgeon: Jaye Fallow, MD;  Location: [facility];  Service:  Ophthalmology;  Laterality: Left;  Diabetic   HEMORRHOID BANDING  X 2   I & D EXTREMITY Left 07/05/2023   Procedure: IRRIGATION AND DEBRIDEMENT OF LEFT FOURTH FINGER;  Surgeon: Arlinda Buster, MD;  Location: [facility] OR;  Service: Orthopedics;  Laterality: Left;   KNEE ARTHROSCOPY Left 2016   meniscus repair   PELVIC FLOOR REPAIR  2001   lift   POLYPECTOMY  X 3   bladder   SHOULDER ARTHROSCOPY W/ ROTATOR CUFF REPAIR Left 2013   SHOULDER ARTHROSCOPY W/ ROTATOR CUFF REPAIR Right 2015   TUBAL LIGATION  ~ 1986   VAGINAL HYSTERECTOMY  1992   Partial   Family History  Problem Relation Age of Onset   Arthritis Mother    Heart disease Mother    Hyperlipidemia Mother    Hypertension Mother    Diabetes Mother    Depression Mother    Stroke Mother    Alcohol abuse Father    Lung cancer Father    Arthritis Maternal Grandmother    Stroke Maternal Grandmother    Diabetes Maternal Grandmother    Heart disease Paternal Uncle        x 2   Arthritis Sister    Breast cancer Cousin    Heart disease Paternal Aunt    Diabetes Paternal Uncle    Diabetes Sister    Heart disease Paternal Uncle        x 5   Heart disease Paternal Aunt        x 3   Stroke Paternal Aunt    Dementia Paternal Grandmother    Colon cancer Neg Hx    Social History   Occupational History   Occupation: Dental Receptionist/Assistant    Comment: Dr. [employer]ib[employer] in [place]  Tobacco Use   Smoking status: Former    Current packs/day: 0.00    Average packs/day: 2.0 packs/day for 5.0 years (10.0 ttl pk-yrs)    Types: Cigarettes    Start date: 09/12/1980    Quit date: 09/12/1985    Years since quitting: 38.9   Smokeless tobacco: Never  Vaping Use   Vaping status: Never Used  Substance and Sexual Activity   Alcohol use: Not Currently   Drug use: No   Sexual activity: Yes   Tobacco Counseling Counseling given: No  SDOH Screenings   Food Insecurity: No Food Insecurity (08/22/2024)  Housing: Unknown  (08/22/2024)  Transportation Needs: No Transportation Needs (08/22/2024)  Utilities: Not At Risk (08/22/2024)  Alcohol Screen: Low Risk (08/22/2023)  Depression (PHQ2-9): Low Risk (08/22/2024)  Recent Concern: Depression (PHQ2-9) - Medium Risk (08/16/2024)  Financial Resource Strain: Low Risk (08/22/2023)  Physical Activity: Inactive (08/22/2024)  Social Connections: Moderately Integrated (08/22/2024)  Recent Concern: Social Connections - Moderately Isolated (08/06/2024)  Stress: No Stress Concern Present (08/22/2024)  Tobacco Use: Medium Risk (08/22/2024)  Health Literacy: Adequate Health  Literacy (08/22/2024)   See flowsheets for full screening details  Depression Screen PHQ 2 & 9 Depression Scale- Over the past 2 weeks, how often have you been bothered by any of the following problems? Little interest or pleasure in doing things: 0 Feeling down, depressed, or hopeless (PHQ Adolescent also includes...irritable): 0 PHQ-2 Total Score: 0 Trouble falling or staying asleep, or sleeping too much: 0 Feeling tired or having little energy: 0 Poor appetite or overeating (PHQ Adolescent also includes...weight loss): 0 Feeling bad about yourself - or that you are a failure or have let yourself or your family down: 0 Trouble concentrating on things, such as reading the newspaper or watching television (PHQ Adolescent also includes...like school work): 0 Moving or speaking so slowly that other people could have noticed. Or the opposite - being so fidgety or restless that you have been moving around a lot more than usual: 0 Thoughts that you would be better off dead, or of hurting yourself in some way: 0 PHQ-9 Total Score: 0 If you checked off any problems, how difficult have these problems made it for you to do your work, take care of things at home, or get along with other people?: Not difficult at all  Depression Treatment Depression Interventions/Treatment : Counseling     Goals Addressed                This Visit's Progress     Remain active (pt-stated)               Objective:    Today's Vitals   08/22/24 0814  Weight: 178 lb (80.7 kg)  Height: 5' 6.5 (1.689 m)   Body mass index is 28.3 kg/m.  Hearing/Vision screen Hearing Screening - Comments:: Wears Hearing Aids Vision Screening - Comments:: Wears rx glasses - up to date with routine eye exams with  [facility] Immunizations and Health Maintenance Health Maintenance  Topic Date Due   Zoster Vaccines- Shingrix (1 of 2) 07/20/2000   Mammogram  06/15/2024   OPHTHALMOLOGY EXAM  10/24/2024   HEMOGLOBIN A1C  02/03/2025   Diabetic kidney evaluation - Urine ACR  04/08/2025   FOOT EXAM  04/08/2025   Diabetic kidney evaluation - eGFR measurement  08/16/2025   Medicare Annual Wellness (AWV)  08/22/2025   DTaP/Tdap/Td (3 - Td or Tdap) 07/03/2033   Colonoscopy  09/11/2033   Pneumococcal Vaccine: 50+ Years  Completed   Influenza Vaccine  Completed   Bone Density Scan  Completed   Hepatitis C Screening  Completed   Meningococcal B Vaccine  Aged Out   Hepatitis B Vaccines 19-59 Average Risk  Discontinued   COVID-19 Vaccine  Discontinued        Assessment/Plan:  This is a routine wellness examination for [employer].  Patient Care Team: Cleatus Arlyss RAMAN, MD as PCP - General (Family Medicine) Delford Maude BROCKS, MD as PCP - Cardiology (Cardiology) Rosalie Kitchens, MD as Consulting Physician (Gastroenterology) Dingeldein, Elspeth, MD (Ophthalmology) Geronimo Manuelita SAUNDERS, [facility] as Pharmacist (Pharmacist)  I have personally reviewed and noted the following in the patients chart:   Medical and social history Use of alcohol, tobacco or illicit drugs  Current medications and supplements including opioid prescriptions. Functional ability and status Nutritional status Physical activity Advanced directives List of other physicians Hospitalizations, surgeries, and ER visits in previous 12 months Vitals Screenings  to include cognitive, depression, and falls Referrals and appointments  No orders of the defined types were placed in this encounter.  In  addition, I have reviewed and discussed with patient certain preventive protocols, quality metrics, and best practice recommendations. A written personalized care plan for preventive services as well as general preventive health recommendations were provided to patient.   Rojelio LELON Blush, LPN   [id number]   Return in 1 year (on 08/29/2025).  After Visit Summary: (MyChart) Due to this being a telephonic visit, the after visit summary with patients personalized plan was offered to patient via MyChart   Nurse Notes: HM Addressed: Mammogram scheduled for [id number]

## 2024-08-28 DIAGNOSIS — R1011 Right upper quadrant pain: Secondary | ICD-10-CM | POA: Diagnosis not present

## 2024-09-02 ENCOUNTER — Other Ambulatory Visit (HOSPITAL_COMMUNITY): Payer: Self-pay | Admitting: Surgery

## 2024-09-02 DIAGNOSIS — R1011 Right upper quadrant pain: Secondary | ICD-10-CM

## 2024-09-03 ENCOUNTER — Ambulatory Visit: Payer: Self-pay | Admitting: Internal Medicine

## 2024-09-03 ENCOUNTER — Other Ambulatory Visit: Payer: Self-pay

## 2024-09-03 ENCOUNTER — Encounter: Payer: Self-pay | Admitting: Internal Medicine

## 2024-09-03 VITALS — BP 128/74 | HR 90 | Temp 97.7°F | Ht 66.5 in | Wt 182.5 lb

## 2024-09-03 DIAGNOSIS — J3089 Other allergic rhinitis: Secondary | ICD-10-CM

## 2024-09-03 NOTE — Patient Instructions (Addendum)
 Other Allergic Rhinitis: - Use nasal saline rinses before nose sprays such as with Neilmed Sinus Rinse.  Use distilled water.   - Use Flonase  2 sprays each nostril daily. Aim upward and outward. - Use Zyrtec 10 mg daily.   Hold all anti-histamines (Xyzal, Allegra, Zyrtec, Claritin , Benadryl , Pepcid) 3 days prior to next visit.  Follow up: 1/6 at 9 for skin testing 1-55

## 2024-09-03 NOTE — Progress Notes (Signed)
 "  NEW PATIENT  Date of Service/Encounter:  09/03/2024  Consult requested by: Cleatus Arlyss RAMAN, MD   Subjective:   Allison Yoder (DOB: 08-Oct-1950) is a 73 y.o. female who presents to the clinic on 09/03/2024 with a chief complaint of Allergies (Sneezing, runny nose, watery eyes, tickly nose) .    History obtained from: chart review and patient.   Rhinitis:  Started many years ago but recently has worsened.   Symptoms include: rhinorrhea, post nasal drainage, sneezing, watery eyes, and itchy nose  Occurs seasonally-Fall Potential triggers: not sure  Treatments tried:  Flonase /Zyrtec/Saline   Previous allergy testing: no History of sinus surgery: none  Nonallergic triggers: none   Reviewed:  08/20/2024: seen by [facility] for sneezing, congestion, runny nose. Using Flonase , Zyrtec, PRN Benadryl . Concern for allergic rhinitis. Refer to Allergy for testing.      Past Medical History: Past Medical History:  Diagnosis Date   Allergy    Anemia    all the time as a child   Anxiety    Arthritis    all my joints; worse in my hands (10/27/2015)   Chronic lower back pain    Depression    Diverticulosis    Fatty liver    GERD (gastroesophageal reflux disease)    Grade I diastolic dysfunction    Heart murmur    dx'd years ago; very mild; never treated (10/27/2015)   Hyperlipidemia    Hypertension    Hypothyroidism 2002-~ 2010   Iritis    per [facility]   Migraines    stopped when I went thru menopause   NASH (nonalcoholic steatohepatitis)    OSA on CPAP    Pneumonia ~ 2013; 10/27/2015   Recurrent urinary tract infection    Routine general medical examination at a health care facility 02/26/2014   Snores    Stroke (HCC) 2017   TIAs x2.  No deficits   Type II diabetes mellitus (HCC)    lost weight; started exercising again; don't have it anymore (10/27/2015)   Urinary incontinence    Wears glasses    Wears hearing aid in both ears    Past Surgical  History: Past Surgical History:  Procedure Laterality Date   BLEPHAROPLASTY Bilateral 2013; 2016   BREAST LUMPECTOMY Right 1964   benign tumor,   CATARACT EXTRACTION W/PHACO Right 01/31/2023   Procedure: CATARACT EXTRACTION PHACO AND INTRAOCULAR LENS PLACEMENT (IOC) RIGHT DIABETIC  5.04  00:36.2;  Surgeon: Jaye Fallow, MD;  Location: [facility];  Service: Ophthalmology;  Laterality: Right;  Diabetic   CATARACT EXTRACTION W/PHACO Left 02/14/2023   Procedure: CATARACT EXTRACTION PHACO AND INTRAOCULAR LENS PLACEMENT (IOC) LEFT DIABETIC  5.73  00:40.1;  Surgeon: Jaye Fallow, MD;  Location: [facility];  Service: Ophthalmology;  Laterality: Left;  Diabetic   HEMORRHOID BANDING  X 2   I & D EXTREMITY Left 07/05/2023   Procedure: IRRIGATION AND DEBRIDEMENT OF LEFT FOURTH FINGER;  Surgeon: Arlinda Buster, MD;  Location: [facility] OR;  Service: Orthopedics;  Laterality: Left;   KNEE ARTHROSCOPY Left 2016   meniscus repair   PELVIC FLOOR REPAIR  2001   lift   POLYPECTOMY  X 3   bladder   SHOULDER ARTHROSCOPY W/ ROTATOR CUFF REPAIR Left 2013   SHOULDER ARTHROSCOPY W/ ROTATOR CUFF REPAIR Right 2015   TUBAL LIGATION  ~ 1986   VAGINAL HYSTERECTOMY  1992   Partial    Family History: Family History  Problem Relation Age of Onset  Arthritis Mother    Heart disease Mother    Hyperlipidemia Mother    Hypertension Mother    Diabetes Mother    Depression Mother    Stroke Mother    Alcohol abuse Father    Lung cancer Father    Arthritis Maternal Grandmother    Stroke Maternal Grandmother    Diabetes Maternal Grandmother    Heart disease Paternal Uncle        x 2   Arthritis Sister    Breast cancer Cousin    Heart disease Paternal Aunt    Diabetes Paternal Uncle    Diabetes Sister    Heart disease Paternal Uncle        x 5   Heart disease Paternal Aunt        x 3   Stroke Paternal Aunt    Dementia Paternal Grandmother    Colon cancer Neg Hx     Social  History:  Flooring in bedroom: wood Pets: dog Tobacco use/exposure: [phone]; wppd Job: retired   Medication List:  Allergies as of 09/03/2024       Reactions   Codeine Itching, Rash   Inapsine [droperidol] Shortness Of Breath, Anxiety, Palpitations, Hypertension   Elevated HR Panic attacks   Keflex [cephalexin] Itching   Crestor  [rosuvastatin ] Other (See Comments)   Aches   Glucophage  [metformin ] Other (See Comments)   GI intolerance  Currently taking 500mg  XR, 1000mg  daily dose with no reported side effects   Lipitor [atorvastatin  Calcium ] Other (See Comments)   Myalgias   Niaspan [niacin] Other (See Comments)   Flushing    Other    Cautions re: GLP1 meds.  NAV with maridebart cafraglutide.   Pravastatin Other (See Comments)   Myalgias   Adhesive [tape] Rash, Other (See Comments)   Blisters   Latex Rash   Latex gloves   Wound Dressing Adhesive Rash        Medication List        Accurate as of September 03, 2024 10:27 AM. If you have any questions, ask your nurse or doctor.          albuterol  108 (90 Base) MCG/ACT inhaler Commonly known as: VENTOLIN  HFA INHALE 1 TO 2 PUFFS INTO THE LUNGS EVERY 6 HOURS AS NEEDED FOR WHEEZING OR SHORTNESS OF BREATH   BD Pen Needle Nano 2nd Gen 32G X 4 MM Misc Generic drug: Insulin  Pen Needle USE WITH INSULIN  PEN EVERY DAY AS DIRECTED.   citalopram  20 MG tablet Commonly known as: CELEXA  Take 1.5 tablets (30 mg total) by mouth daily.   clonazePAM  0.5 MG tablet Commonly known as: KLONOPIN  Take 1 tablet (0.5 mg total) by mouth daily as needed for anxiety.   cyclobenzaprine  10 MG tablet Commonly known as: FLEXERIL  Take 1 tablet (10 mg total) by mouth daily as needed for muscle spasms.   Lantus  SoloStar 100 UNIT/ML Solostar Pen Generic drug: insulin  glargine Inject 35 Units into the skin daily. Take 35 units for the next several days-as your appetite improves-slowly increase it back to your usual regimen of 50-60 units  daily.   lisinopril  40 MG tablet Commonly known as: ZESTRIL  Take 1 tablet (40 mg total) by mouth daily.   metFORMIN  500 MG 24 hr tablet Commonly known as: GLUCOPHAGE -XR Take 1 tablet (500 mg total) by mouth 2 (two) times daily with a meal.   metoprolol  succinate 50 MG 24 hr tablet Commonly known as: TOPROL -XL TAKE 1 TABLET BY MOUTH  DAILY WITH OR IMMEDIATELY  FOLLOWING A MEAL   Multivitamin Women 50+ Tabs Take 1 tablet by mouth daily.   ondansetron  4 MG disintegrating tablet Commonly known as: ZOFRAN -ODT Take 1 tablet (4 mg total) by mouth every 8 (eight) hours as needed for nausea.   OneTouch Delica Plus Lancet33G Misc USE ONCE DAILY AS DIRECTED   OneTouch Ultra test strip Generic drug: glucose blood as directed to check blood sugar daily. Dx E11.9   pantoprazole  40 MG tablet Commonly known as: PROTONIX  Take 1 tablet (40 mg total) by mouth daily.   polyethylene glycol 17 g packet Commonly known as: MIRALAX  / GLYCOLAX  Take 17 g by mouth 2 (two) times daily.   Repatha  SureClick 140 MG/ML Soaj Generic drug: Evolocumab  ADMINISTER 1 ML UNDER THE SKIN EVERY 14 DAYS   VITAMIN D -3 PO Take 1 capsule by mouth daily.         REVIEW OF SYSTEMS: Pertinent positives and negatives discussed in HPI.   Objective:   Physical Exam: BP 128/74 (BP Location: Right Arm, Patient Position: Sitting, Cuff Size: Normal)   Pulse 90   Temp 97.7 F (36.5 C) (Temporal)   Ht 5' 6.5 (1.689 m)   Wt 182 lb 8 oz (82.8 kg)   SpO2 99%   BMI 29.01 kg/m  Body mass index is 29.01 kg/m. GEN: alert, well developed HEENT: clear conjunctiva, nose with + mild inferior turbinate hypertrophy, boggy nasal mucosa, slight clear rhinorrhea, + cobblestoning HEART: regular rate and rhythm, no murmur LUNGS: clear to auscultation bilaterally, no coughing, unlabored respiration ABDOMEN: soft, non distended  SKIN: no rashes or lesions  Assessment:   1. Other allergic rhinitis      Plan/Recommendations:  Other Allergic Rhinitis: - Due to turbinate hypertrophy, seasonal symptoms and unresponsive to over the counter meds, will perform skin testing to identify aeroallergen triggers.   - Positive skin test to: - Avoidance measures discussed. - Use nasal saline rinses before nose sprays such as with Neilmed Sinus Rinse.  Use distilled water.   - Use Flonase  2 sprays each nostril daily. Aim upward and outward. - Use Zyrtec 10 mg daily.   Hold all anti-histamines (Xyzal, Allegra, Zyrtec, Claritin , Benadryl , Pepcid) 3 days prior to next visit.  Follow up: 1/6 at 9 for skin testing 1-55   Arleta Blanch, MD [facility]        "

## 2024-09-06 ENCOUNTER — Other Ambulatory Visit: Payer: Self-pay | Admitting: Cardiovascular Disease

## 2024-09-17 ENCOUNTER — Ambulatory Visit: Admitting: Internal Medicine

## 2024-09-17 DIAGNOSIS — K529 Noninfective gastroenteritis and colitis, unspecified: Secondary | ICD-10-CM | POA: Insufficient documentation

## 2024-09-18 ENCOUNTER — Ambulatory Visit (HOSPITAL_COMMUNITY)

## 2024-09-19 ENCOUNTER — Encounter (HOSPITAL_COMMUNITY): Payer: Self-pay

## 2024-09-19 ENCOUNTER — Encounter (HOSPITAL_COMMUNITY)
Admission: RE | Admit: 2024-09-19 | Discharge: 2024-09-19 | Disposition: A | Source: Ambulatory Visit | Attending: Surgery | Admitting: Surgery

## 2024-09-19 DIAGNOSIS — R1011 Right upper quadrant pain: Secondary | ICD-10-CM | POA: Insufficient documentation

## 2024-09-19 MED ORDER — TECHNETIUM TC 99M MEBROFENIN IV KIT
5.0000 | PACK | Freq: Once | INTRAVENOUS | Status: AC
  Administered 2024-09-19: 5.45 via INTRAVENOUS

## 2024-09-24 ENCOUNTER — Ambulatory Visit: Admitting: Internal Medicine

## 2024-09-24 DIAGNOSIS — J3089 Other allergic rhinitis: Secondary | ICD-10-CM | POA: Diagnosis not present

## 2024-09-24 NOTE — Progress Notes (Signed)
 "  FOLLOW UP Date of Service/Encounter:  09/24/2024   Subjective:  Allison Yoder (DOB: 1951-04-26) is a 74 y.o. female who [facility] for follow up for skin testing.   History obtained from: chart review and patient.  Anti histamines held.   Past Medical History: Past Medical History:  Diagnosis Date   Allergy     Anemia    all the time as a child   Anxiety    Arthritis    all my joints; worse in my hands (10/27/2015)   Chronic lower back pain    Depression    Diverticulosis    Fatty liver    GERD (gastroesophageal reflux disease)    Grade I diastolic dysfunction    Heart murmur    dx'd years ago; very mild; never treated (10/27/2015)   Hyperlipidemia    Hypertension    Hypothyroidism 2002-~ 2010   Iritis    per [facility]   Migraines    stopped when I went thru menopause   NASH (nonalcoholic steatohepatitis)    OSA on CPAP    Pneumonia ~ 2013; 10/27/2015   Recurrent urinary tract infection    Routine general medical examination at a health care facility 02/26/2014   Snores    Stroke (HCC) 2017   TIAs x2.  No deficits   Type II diabetes mellitus (HCC)    lost weight; started exercising again; don't have it anymore (10/27/2015)   Urinary incontinence    Wears glasses    Wears hearing aid in both ears     Objective:  There were no vitals taken for this visit. There is no height or weight on file to calculate BMI. Physical Exam: GEN: alert, well developed HEENT: clear conjunctiva, MMM LUNGS: unlabored respiration   Skin Testing:  Skin prick testing was placed, which includes aeroallergens/foods, histamine control, and saline control.  Verbal consent was obtained prior to placing test.  Patient tolerated procedure well.  Allergy  testing results were read and interpreted by myself, documented by clinical staff. Adequate positive and negative control.  Positive results to:  Results discussed with  patient/family.  Airborne Adult Perc - 09/24/24 0921     Time Antigen Placed 9078    Allergen Manufacturer Jestine    Location Back    Number of Test 55    1. Control-Buffer 50% Glycerol Negative    2. Control-Histamine 2+    3. Bahia Negative    4. Bermuda Negative    5. Johnson Negative    6. [place]  Blue Negative    7. Meadow Fescue Negative    8. Perennial Rye Negative    9. Timothy Negative    10. Ragweed Mix Negative    11. Cocklebur Negative    12. Plantain,  English Negative    13. Baccharis Negative    14. Dog Fennel Negative    15. Russian Thistle Negative    16. Lamb's Quarters Negative    17. Sheep Sorrell Negative    18. Rough Pigweed Negative    19. Marsh Elder, Rough Negative    20. Mugwort, Common Negative    21. Box, Elder Negative    22. Cedar, red Negative    23. Sweet Gum Negative    24. Pecan Pollen Negative    25. Pine Mix Negative    26. Walnut, Black Pollen Negative    27. Red Mulberry Negative    28. Ash Mix Negative  29. Birch Mix Negative    30. Beech American Negative    31. Cottonwood, Eastern Negative    32. Hickory, White Negative    33. Maple Mix Negative    34. Oak, Eastern Mix Negative    35. Sycamore Eastern Negative    36. Alternaria Alternata Negative    37. Cladosporium Herbarum Negative    38. Aspergillus Mix Negative    39. Penicillium Mix Negative    40. Bipolaris Sorokiniana (Helminthosporium) Negative    41. Drechslera Spicifera (Curvularia) Negative    42. Mucor Plumbeus Negative    43. Fusarium Moniliforme Negative    44. Aureobasidium Pullulans (pullulara) Negative    45. Rhizopus Oryzae Negative    46. Botrytis Cinera Negative    47. Epicoccum Nigrum Negative    48. Phoma Betae Negative    49. Dust Mite Mix Negative    50. Cat Hair 10,000 BAU/ml Negative    51.  Dog Epithelia Negative    52. Mixed Feathers Negative    53. Horse Epithelia Negative    54. Cockroach, German Negative    55. Tobacco Leaf Negative            Assessment:   1. Other allergic rhinitis     Plan/Recommendations:  Other Allergic Rhinitis: - Due to turbinate hypertrophy, seasonal symptoms and unresponsive to over the counter meds, will perform skin testing to identify aeroallergen triggers.  Negative by skin testing but symptoms concerning for an allergic component, will obtain blood testing. Will consider adding Azelastine .  - Positive skin test 09/2024: none - Use nasal saline rinses before nose sprays such as with Neilmed Sinus Rinse.  Use distilled water.   - Use Flonase  2 sprays each nostril daily. Aim upward and outward. - Use Zyrtec 10 mg daily.     Return in about 3 months (around 12/23/2024).  Arleta Blanch, MD [facility]       "

## 2024-09-24 NOTE — Patient Instructions (Signed)
 Other Allergic Rhinitis: - Due to turbinate hypertrophy, seasonal symptoms and unresponsive to over the counter meds, will perform skin testing to identify aeroallergen triggers.   - Positive skin test 09/2024:  - Avoidance measures discussed. - Use nasal saline rinses before nose sprays such as with Neilmed Sinus Rinse.  Use distilled water.   - Use Flonase  2 sprays each nostril daily. Aim upward and outward. - Use Zyrtec 10 mg daily.

## 2024-09-27 ENCOUNTER — Ambulatory Visit: Payer: Self-pay | Admitting: Internal Medicine

## 2024-09-27 LAB — ALLERGEN PROFILE WITH TOTAL IGE, RESPIRATORY-AREA 2

## 2024-09-27 MED ORDER — AZELASTINE HCL 0.1 % NA SOLN: 2.0000 | Freq: Two times a day (BID) | NASAL | 5 refills | Status: AC | PRN

## 2024-09-27 NOTE — Progress Notes (Signed)
 Spoke to patient and informed lab results per Dr. Tobie:   Allergy  test is negative by bloodwork. You have chronic non allergic rhinitis   Use Flonase  2 SEN in morning and Azelastine  (new spray sent in) 2 SEN in evening.   She verbalized understand and no concern.

## 2024-10-07 ENCOUNTER — Other Ambulatory Visit: Payer: Self-pay | Admitting: Family Medicine

## 2024-10-07 DIAGNOSIS — I10 Essential (primary) hypertension: Secondary | ICD-10-CM

## 2024-10-07 NOTE — Telephone Encounter (Signed)
 E-scribed refills.  Pls schedule annual exam and fasting labs for additional refills.

## 2024-10-09 ENCOUNTER — Ambulatory Visit: Payer: Self-pay | Admitting: Surgery

## 2024-10-09 NOTE — Progress Notes (Signed)
 Nuclear med hepatobiliary scan does show mild hyperkinesia, which may be responsible for some of her symptoms.  I would like to re-evaluate the patient in March 2026 and see if she is having persistent symptoms.  If so, we may consider proceeding with cholecystectomy this spring.  Burnard - please arrange a follow up appt with me in March or April.  Krystal Spinner, MD [facility] practice Office: [phone]

## 2024-10-17 ENCOUNTER — Other Ambulatory Visit: Payer: Self-pay | Admitting: Family Medicine

## 2025-08-29 ENCOUNTER — Ambulatory Visit
